# Patient Record
Sex: Female | Born: 1945 | Race: White | Hispanic: No | Smoking: Never smoker
Health system: Southern US, Community
[De-identification: ages and names within clinical notes are randomized; demographics above are authoritative.]

## PROBLEM LIST (undated history)

## (undated) DIAGNOSIS — I119 Hypertensive heart disease without heart failure: Secondary | ICD-10-CM

## (undated) DIAGNOSIS — I10 Essential (primary) hypertension: Secondary | ICD-10-CM

## (undated) DIAGNOSIS — I4819 Other persistent atrial fibrillation: Secondary | ICD-10-CM

## (undated) DIAGNOSIS — E119 Type 2 diabetes mellitus without complications: Secondary | ICD-10-CM

## (undated) DIAGNOSIS — R1032 Left lower quadrant pain: Secondary | ICD-10-CM

## (undated) DIAGNOSIS — Z9989 Dependence on other enabling machines and devices: Secondary | ICD-10-CM

## (undated) DIAGNOSIS — G4710 Hypersomnia, unspecified: Secondary | ICD-10-CM

## (undated) DIAGNOSIS — E559 Vitamin D deficiency, unspecified: Secondary | ICD-10-CM

## (undated) DIAGNOSIS — Z87442 Personal history of urinary calculi: Secondary | ICD-10-CM

## (undated) DIAGNOSIS — Z6841 Body Mass Index (BMI) 40.0 and over, adult: Secondary | ICD-10-CM

## (undated) DIAGNOSIS — G4733 Obstructive sleep apnea (adult) (pediatric): Secondary | ICD-10-CM

## (undated) DIAGNOSIS — K219 Gastro-esophageal reflux disease without esophagitis: Secondary | ICD-10-CM

## (undated) DIAGNOSIS — E7800 Pure hypercholesterolemia, unspecified: Secondary | ICD-10-CM

## (undated) DIAGNOSIS — R011 Cardiac murmur, unspecified: Secondary | ICD-10-CM

## (undated) DIAGNOSIS — J45909 Unspecified asthma, uncomplicated: Secondary | ICD-10-CM

## (undated) DIAGNOSIS — M1990 Unspecified osteoarthritis, unspecified site: Secondary | ICD-10-CM

## (undated) DIAGNOSIS — I420 Dilated cardiomyopathy: Secondary | ICD-10-CM

## (undated) DIAGNOSIS — F4321 Adjustment disorder with depressed mood: Secondary | ICD-10-CM

## (undated) DIAGNOSIS — I5032 Chronic diastolic (congestive) heart failure: Secondary | ICD-10-CM

## (undated) DIAGNOSIS — M7070 Other bursitis of hip, unspecified hip: Secondary | ICD-10-CM

## (undated) HISTORY — PX: APPENDECTOMY: SHX54

## (undated) HISTORY — PX: TONSILLECTOMY: SUR1361

## (undated) HISTORY — PX: OTHER SURGICAL HISTORY: SHX169

## (undated) HISTORY — DX: Left lower quadrant pain: R10.32

## (undated) HISTORY — DX: Morbid (severe) obesity due to excess calories: E66.01

## (undated) HISTORY — DX: Gastro-esophageal reflux disease without esophagitis: K21.9

## (undated) HISTORY — DX: Unspecified asthma, uncomplicated: J45.909

## (undated) HISTORY — DX: Other bursitis of hip, unspecified hip: M70.70

## (undated) HISTORY — DX: Dilated cardiomyopathy: I42.0

## (undated) HISTORY — DX: Essential (primary) hypertension: I10

## (undated) HISTORY — PX: ABDOMINAL HYSTERECTOMY: SHX81

## (undated) HISTORY — DX: Chronic diastolic (congestive) heart failure: I50.32

## (undated) HISTORY — PX: CHOLECYSTECTOMY OPEN: SUR202

## (undated) HISTORY — DX: Vitamin D deficiency, unspecified: E55.9

## (undated) HISTORY — DX: Body Mass Index (BMI) 40.0 and over, adult: Z684

## (undated) HISTORY — PX: DILATION AND CURETTAGE OF UTERUS: SHX78

## (undated) HISTORY — DX: Hypersomnia, unspecified: G47.10

## (undated) HISTORY — DX: Other persistent atrial fibrillation: I48.19

## (undated) HISTORY — PX: CARDIAC CATHETERIZATION: SHX172

## (undated) HISTORY — PX: NASAL SEPTUM SURGERY: SHX37

## (undated) HISTORY — DX: Hypertensive heart disease without heart failure: I11.9

---

## 1998-06-24 ENCOUNTER — Ambulatory Visit (HOSPITAL_COMMUNITY): Admission: RE | Admit: 1998-06-24 | Discharge: 1998-06-24 | Payer: Self-pay | Admitting: Family Medicine

## 1998-08-18 ENCOUNTER — Ambulatory Visit (HOSPITAL_COMMUNITY): Admission: RE | Admit: 1998-08-18 | Discharge: 1998-08-18 | Payer: Self-pay | Admitting: Gastroenterology

## 1999-12-10 ENCOUNTER — Ambulatory Visit (HOSPITAL_COMMUNITY): Admission: RE | Admit: 1999-12-10 | Discharge: 1999-12-10 | Payer: Self-pay | Admitting: Family Medicine

## 1999-12-10 ENCOUNTER — Encounter: Payer: Self-pay | Admitting: Family Medicine

## 2001-02-10 ENCOUNTER — Emergency Department (HOSPITAL_COMMUNITY): Admission: EM | Admit: 2001-02-10 | Discharge: 2001-02-10 | Payer: Self-pay | Admitting: Emergency Medicine

## 2001-05-30 ENCOUNTER — Ambulatory Visit (HOSPITAL_COMMUNITY): Admission: RE | Admit: 2001-05-30 | Discharge: 2001-05-30 | Payer: Self-pay | Admitting: Family Medicine

## 2001-05-30 ENCOUNTER — Encounter: Payer: Self-pay | Admitting: Family Medicine

## 2002-05-15 ENCOUNTER — Ambulatory Visit (HOSPITAL_COMMUNITY): Admission: RE | Admit: 2002-05-15 | Discharge: 2002-05-15 | Payer: Self-pay | Admitting: Family Medicine

## 2002-05-15 ENCOUNTER — Encounter: Payer: Self-pay | Admitting: Family Medicine

## 2003-06-26 ENCOUNTER — Ambulatory Visit (HOSPITAL_COMMUNITY): Admission: RE | Admit: 2003-06-26 | Discharge: 2003-06-26 | Payer: Self-pay | Admitting: Otolaryngology

## 2003-06-26 ENCOUNTER — Encounter (INDEPENDENT_AMBULATORY_CARE_PROVIDER_SITE_OTHER): Payer: Self-pay | Admitting: Specialist

## 2003-07-08 ENCOUNTER — Ambulatory Visit (HOSPITAL_COMMUNITY): Admission: RE | Admit: 2003-07-08 | Discharge: 2003-07-08 | Payer: Self-pay | Admitting: Family Medicine

## 2003-11-29 ENCOUNTER — Ambulatory Visit (HOSPITAL_COMMUNITY): Admission: RE | Admit: 2003-11-29 | Discharge: 2003-11-29 | Payer: Self-pay | Admitting: Gastroenterology

## 2004-04-16 ENCOUNTER — Other Ambulatory Visit: Admission: RE | Admit: 2004-04-16 | Discharge: 2004-04-16 | Payer: Self-pay | Admitting: Family Medicine

## 2004-04-21 ENCOUNTER — Encounter: Admission: RE | Admit: 2004-04-21 | Discharge: 2004-04-21 | Payer: Self-pay | Admitting: Family Medicine

## 2004-09-16 ENCOUNTER — Ambulatory Visit (HOSPITAL_COMMUNITY): Admission: RE | Admit: 2004-09-16 | Discharge: 2004-09-16 | Payer: Self-pay | Admitting: Family Medicine

## 2005-09-02 ENCOUNTER — Ambulatory Visit (HOSPITAL_COMMUNITY): Admission: RE | Admit: 2005-09-02 | Discharge: 2005-09-02 | Payer: Self-pay | Admitting: Family Medicine

## 2006-09-07 ENCOUNTER — Ambulatory Visit (HOSPITAL_COMMUNITY): Admission: RE | Admit: 2006-09-07 | Discharge: 2006-09-07 | Payer: Self-pay | Admitting: Family Medicine

## 2006-09-19 ENCOUNTER — Encounter: Admission: RE | Admit: 2006-09-19 | Discharge: 2006-09-19 | Payer: Self-pay | Admitting: Cardiology

## 2006-09-23 ENCOUNTER — Ambulatory Visit (HOSPITAL_COMMUNITY): Admission: RE | Admit: 2006-09-23 | Discharge: 2006-09-23 | Payer: Self-pay | Admitting: Cardiology

## 2008-06-23 ENCOUNTER — Emergency Department (HOSPITAL_COMMUNITY): Admission: EM | Admit: 2008-06-23 | Discharge: 2008-06-23 | Payer: Self-pay | Admitting: Emergency Medicine

## 2008-09-19 ENCOUNTER — Ambulatory Visit (HOSPITAL_COMMUNITY): Admission: RE | Admit: 2008-09-19 | Discharge: 2008-09-19 | Payer: Self-pay | Admitting: Family Medicine

## 2009-11-13 ENCOUNTER — Ambulatory Visit (HOSPITAL_COMMUNITY): Admission: RE | Admit: 2009-11-13 | Discharge: 2009-11-13 | Payer: Self-pay | Admitting: *Deleted

## 2010-03-01 ENCOUNTER — Encounter: Payer: Self-pay | Admitting: Family Medicine

## 2010-06-23 NOTE — Cardiovascular Report (Signed)
NAME:  Karen Rasmussen, Pollinger                  ACCOUNT NO.:  [id number]   MEDICAL RECORD NO.:  [id number]          PATIENT TYPE:  OIB   LOCATION:  2899                         FACILITY:  [facility]   PHYSICIAN:  Armanda Magic, M.D.     DATE OF BIRTH:  1945-04-24   DATE OF PROCEDURE:  09/23/2006  DATE OF DISCHARGE:  09/23/2006                            CARDIAC CATHETERIZATION   PROCEDURE:  Left heart catheterization, coronary angiography, left  ventriculography.   OPERATOR:  Armanda Magic, M.D. and Donato Schultz, M.D.   COMPLICATIONS:  None.   IV access via right femoral artery 6-French sheath.   This is a very pleasant 65 year old female, morbidly obese, with a  recent history of shortness of breath.  Has not had any chest pain  but  chronic shortness of breath and now presents for cardiac catheterization  because of abnormal Cardiolite.   The patient is brought to the cardiac catheterization laboratory in the  fasting nonsedated state.  Informed consent was obtained.  The patient  was connected to continuous heart rate and pulse oximetry monitoring and  intermittent blood pressure monitoring.  The right groin was prepped and  draped in sterile fashion.  One percent Xylocaine was used for local  anesthesia.  Using the modified Seldinger technique, a 6-French sheath  was placed in the right femoral artery.  Under fluoroscopic guidance, a  6-French JL-4 catheter was placed in the left coronary artery.  Multiple  cine films were taken at 30 degree RAO and 40 degree LAO views.  This  catheter was then exchanged out over a guidewire for a 6-French JR-4  catheter which was placed under fluoroscopic guidance to the right  coronary artery.  Multiple cine films were taken at 30 degree RAO and 40  degree LAO views.  This catheter was the exchanged out over a guidewire  for a 6-French angled pigtail catheter which was placed under  fluoroscopic guidance in the left ventricular cavity.  Left  ventriculography  was performed in the 30 degree RAO view using a total  of 30 cc of contrast at 15 cc per second.  The catheter was then pulled  back across the aortic valve with no significant gradient noted.  At the  end of the procedure, all catheters and sheaths were removed.  Manual  compression was performed until adequate hemostasis was obtained.  The  patient was transferred back to her room in stable condition.   RESULTS:  The left main coronary is widely patent and bifurcates to the  left anterior descending artery and left circumflex artery.  The left  anterior descending artery is widely patent throughout its course at the  apex, giving rise to one diagonal branch which is widely patent.   The left circumflex is widely patent throughout its course.  It gives  rise to three obtuse marginal branches, all of which are widely patent.  The distal left circumflex is widely patent, as well.   The RCA is widely patent throughout its course and bifurcates into a  posterior descending artery and posterior lateral artery, both of which  are widely patent.   Left ventriculography shows normal LV systolic function, EF 60%.  Left  ventricular pressure was 134/17, aortic pressure 132/61, LVEDP 26 mmHg.   ASSESSMENT:  1. Normal coronary arteries.  2. Normal left ventricular function.  3. Noncardiac chest pain.  4. Obesity.  5. Elevated left ventricular end diastolic pressure consistent with      diastolic dysfunction.   PLAN:  Discharge to home after IV fluid and bed rest,  Will add HCTZ 25  mg daily for increased LVEDP and diastolic dysfunction, and she will  follow up with me in 2 weeks.      Armanda Magic, M.D.  Electronically Signed     TT/MEDQ  D:  09/23/2006  T:  09/23/2006  Job:  [id number]   cc:   Sigmund Hazel, M.D.

## 2010-06-26 NOTE — H&P (Signed)
NAME:  Karen Rasmussen, Karen Rasmussen                            ACCOUNT NO.:  [id number]   MEDICAL RECORD NO.:  [id number]                   PATIENT TYPE:  OIB   LOCATION:  2899                                 FACILITY:  [facility]   PHYSICIAN:  Hermelinda Medicus, M.D.                DATE OF BIRTH:  02-24-1945   DATE OF ADMISSION:  06/26/2003  DATE OF DISCHARGE:                                HISTORY & PHYSICAL   This patient is a 65 year old female who has had difficulty with sinuses in  the past.  She has had considerable problems with sinusitis.  She has been  on antibiotics in the past and also been on nasal sprays with Tussionex for  cough.  She has been more recently on Prolex-D and Cefzil, under my care.  She has had a nasal obstruction and has had sinusitis problems and has been  a candidate for septal reconstruction, turbinate reduction for some time.  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  She also wishes to have nasal tip plasty and also here  she will have an upper lid blepharoplasty.   PAST HISTORY:   MEDICATIONS:  1. Zoloft 100 mg h.s.  2. __________ 100 mg b.i.d.  3. Guaifenesin 600.  4. Decongestant for nasal stuffiness which is Prolex-D one half tablet     b.i.d.  5. Bactroban cream.  6. Aspirin 81.  7. She has had amoxicillin before the procedure.   She has had:  1. T&A as a child .  2. Tubal ligation.  3. Hysterectomy, 1992.  4. Gallbladder, 1987.   She does have:  1. A murmur of her mitral valve prolapse.  2. She has had an EKG and Doppler report which show normal left ventricular     dimension, preserved systolic function, mild aortic valve sclerosis, with     a peak velocity of 2.1 M/sec, mild right ventricular hypertrophy.  In     view of the mild aortic sclerosis, they are  recommending bacterial     endocarditis prophylaxis.   She, otherwise, is in excellent health and this was done by Dr. Hyacinth Meeker at  [facility].   PHYSICAL EXAMINATION:  GENERAL:  Reveals a well nourished well developed  female in no acute distress.  VITAL SIGNS:  Blood pressure 118/70, her heart rate is 77, respirations 18.  She weighs 247 and is 6 foot 2 inches which is tall.  EARS:  Clear.  The tympanic membranes are clear.  NOSE:  Shows a severe septal deviation to the left with concha bullosa on  the right.  CARDIOVASCULAR:  She has, on examination, a mild murmur of aortic sclerosis.  __________ otherwise abnormalities except for the murmur of aortic  sclerosis.  CHEST:  Clear.  No rales, rhonchi, or wheezes.  ABDOMEN:  Unremarkable.  EXTREMITIES:  Unremarkable.   INITIAL DIAGNOSES:  1. Septal deviation.  2. 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  3. History of a murmur of aortic sclerosis.  4. Mild right ventricular hypertrophy.  5. Mild aortic valve sclerosis.   The T&A as a child, tubal ligation, hysterectomy, gallbladder, and  appendectomy history.                                                Hermelinda Medicus, M.D.    JC/MEDQ  D:  06/26/2003  T:  06/26/2003  Job:  [id number]   cc:   Hyacinth Meeker, Dr.  [facility]   Meade Maw, M.D.  [address]  [place]  [place] [address]  Fax: [phone]

## 2010-06-26 NOTE — Op Note (Signed)
NAME:  Karen Rasmussen, Karen Rasmussen                  ACCOUNT NO.:  [id number]   MEDICAL RECORD NO.:  [id number]          PATIENT TYPE:  AMB   LOCATION:  ENDO                         [facility]AC[facility]   PHYSICIAN:  John C. Madilyn Fireman, M.D.    DATE OF BIRTH:  Feb 17, 1945   DATE OF PROCEDURE:  11/29/2003  DATE OF DISCHARGE:                                 OPERATIVE REPORT   PROCEDURE:  Colonoscopy.   INDICATIONS FOR PROCEDURE:  Family history of colon cancer due for  surveillance with last colonoscopy five years ago.   DESCRIPTION OF PROCEDURE:  The patient was placed in the left lateral  decubitus position then placed on the pulse monitor with continuous low flow  oxygen delivered by nasal cannula. She was sedated with 87.5 mcg IV fentanyl  and 9  mg IV Versed. The Olympus video colonoscope was inserted into the  rectum and advanced to the cecum, confirmed by transillumination at  McBurney's point and visualization at the ileocecal valve and appendiceal  orifice. The prep was good.  The cecum, ascending, transverse, descending  and sigmoid colon all appeared normal with no masses, polyps, diverticula or  other mucosal abnormalities. The rectum likewise appeared normal and  retroflexed view of the anus revealed no obvious internal hemorrhoids. The  scope was then withdrawn and the patient returned to the recovery room in  stable condition. The patient tolerated the procedure well and there were no  immediate complications.   IMPRESSION:  Normal colonoscopy.   PLAN:  Repeat study in five years.      JCH/MEDQ  D:  11/29/2003  T:  11/29/2003  Job:  [id number]   cc:   Sigmund Hazel, M.D.  [address]  [address] [place], [place] [address]  Fax: [phone]

## 2010-06-26 NOTE — Op Note (Signed)
NAME:  Karen Rasmussen, Karen Rasmussen                            ACCOUNT NO.:  [id number]   MEDICAL RECORD NO.:  [id number]                   PATIENT TYPE:  OIB   LOCATION:  2899                                 FACILITY:  [facility]   PHYSICIAN:  Hermelinda Medicus, M.D.                DATE OF BIRTH:  1945-03-15   DATE OF PROCEDURE:  06/26/2003  DATE OF DISCHARGE:                                 OPERATIVE REPORT   PREOPERATIVE DIAGNOSIS:  Septal deviation with nasal tip deformity with  upper lid blepharoptosis, blepharochalasis.   POSTOPERATIVE DIAGNOSIS:  Septal deviation with nasal tip deformity with  upper lid blepharoptosis, blepharochalasis.   OPERATION PERFORMED:  Septal reconstruction, nasal tip plasty with upper lid  blepharoplasty.   SURGEON:  Hermelinda Medicus, M.D.   ANESTHESIA:  Local 1% Xylocaine with epinephrine, topical cocaine 200 mg and  general anesthesia MAC.   ANESTHESIOLOGIST:  Burna Forts, M.D.   DESCRIPTION OF PROCEDURE:  Patient placed in supine position and under local  anesthesia was prepped and draped in the usual manner using the usual head  drape and the usual sinus draping.  The septum was first approached and the  1% Xylocaine with epinephrine and topical cocaine was used and then a  hemitransfixion incision was made on the septum on the right side, carried  around the columella and the columella was trimmed conservatively.  The  septal quadrilateral cartilage was then approached and was grossly deviated  over to the left and then was spurred in the vomerine region over pushing  against the side of the nose.  This approached using the pierce and the  Cottle was used, then the Purdy and a strip of quadrilateral cartilage was  taken readily from the posterior aspect in the floor of the nose.  A 4 mm  chisel was then used to take down the premaxillary spur crest.  A vomerine  septal deviation was then approached but first we approached the middle  turbinate concha bullosa,  especially on the right side, and crushed this,  making it much smaller but not removing any mucous membrane.  This was done  on both sides.  On the right, it was much more severe, however.  Then we  were able to take out this spur that was in the vomerine region and bring  the septum back to the midline.  Once the septum and quadrilateral cartilage  was back to the midline and off and was back on the premaxillary crest, we  then could begin to close this.  Before we completed this, we used the open  and closed Jansen-Middletons to take down some of the ethmoid septal  deviation also.  Closure with 5-0 plain catgut and a through-and-through  septal suture using 4-0 plain catgut times two.  Once the septum was  completed, an intercartilaginous incision was made and a rim incision was  made.  The  lower lateral cartilages were exposed.  They were trimmed  appropriately in an effort to gain symmetry (1) and (2) resolve the bulbous  appearance of this patient's nose. This lower lateral cartilage was then  returned to its original position and the rim incision was closed with a 5-0  plain catgut and the intercartilaginous incision was closed partially with a  5-0 plain catgut.  All bleeding was totally under control.  We did not place  any packing or nasal casting until we completed our blepharoplasty.  The  blepharoplasty incisions were already marked in the recovery room and we  used 1% Xylocaine with epinephrine for our anesthesia and then excised  appropriate excess upper lid skin along with medial fat and then hemostasis  was established with Bovie electrocoagulation. The right eyelid was more  involved and more severe than the left, so therefore slightly more skin was  removed.  Once this was completed, all hemostasis was established and then  closure was with a subcuticular stitch using 6-0 Ethilon and then lateral in  the small lines or crow's feet area, interrupted stitches were used  still  using the 6-0.  Steri-Strips were applied.  The opposite side was done in  the exact same way and closure was completed in the exact same way.  The  cold compresses were placed on the eyes.  The nose was then suctioned, the  nasal dressing was then placed and an external nasal cast was placed also.  The patient tolerated the procedure well and is doing well postoperatively.  Follow-up will be tomorrow for removal of the nasal packing and then on  Monday for removal of the cast and later in the week for removal of upper  lid stitches.  The patient tolerated the procedure well and is doing well  postoperatively and her follow-up will continue in two weeks, three weeks,  five weeks, three months, six months and a year.                                               Hermelinda Medicus, M.D.    JC/MEDQ  D:  06/26/2003  T:  06/27/2003  Job:  [id number]   cc:   Dr. Altamese Dilling, M.D.  [address]  [place]  [place] [address]  Fax: [phone]

## 2010-11-06 ENCOUNTER — Emergency Department (HOSPITAL_COMMUNITY): Payer: No Typology Code available for payment source

## 2010-11-06 ENCOUNTER — Emergency Department (HOSPITAL_COMMUNITY)
Admission: EM | Admit: 2010-11-06 | Discharge: 2010-11-06 | Disposition: A | Discharge status: Home / Self Care | Payer: No Typology Code available for payment source | Attending: Emergency Medicine | Admitting: Emergency Medicine

## 2010-11-06 DIAGNOSIS — N201 Calculus of ureter: Secondary | ICD-10-CM | POA: Insufficient documentation

## 2010-11-06 DIAGNOSIS — R109 Unspecified abdominal pain: Secondary | ICD-10-CM | POA: Insufficient documentation

## 2010-11-06 DIAGNOSIS — G4730 Sleep apnea, unspecified: Secondary | ICD-10-CM | POA: Insufficient documentation

## 2010-11-06 DIAGNOSIS — R319 Hematuria, unspecified: Secondary | ICD-10-CM | POA: Insufficient documentation

## 2010-11-06 DIAGNOSIS — R1032 Left lower quadrant pain: Secondary | ICD-10-CM | POA: Insufficient documentation

## 2010-11-06 LAB — URINE MICROSCOPIC-ADD ON

## 2010-11-06 LAB — URINALYSIS, ROUTINE W REFLEX MICROSCOPIC
Glucose, UA: NEGATIVE mg/dL
Leukocytes, UA: NEGATIVE
Protein, ur: NEGATIVE mg/dL
Specific Gravity, Urine: 1.021 (ref 1.005–1.030)

## 2010-11-07 ENCOUNTER — Emergency Department (HOSPITAL_COMMUNITY)
Admission: EM | Admit: 2010-11-07 | Discharge: 2010-11-08 | Disposition: A | Discharge status: Home / Self Care | Payer: No Typology Code available for payment source | Attending: Emergency Medicine | Admitting: Emergency Medicine

## 2010-11-07 DIAGNOSIS — Z87442 Personal history of urinary calculi: Secondary | ICD-10-CM | POA: Insufficient documentation

## 2010-11-07 DIAGNOSIS — I10 Essential (primary) hypertension: Secondary | ICD-10-CM | POA: Insufficient documentation

## 2010-11-07 DIAGNOSIS — R509 Fever, unspecified: Secondary | ICD-10-CM | POA: Insufficient documentation

## 2010-11-07 DIAGNOSIS — R10819 Abdominal tenderness, unspecified site: Secondary | ICD-10-CM | POA: Insufficient documentation

## 2010-11-07 DIAGNOSIS — R000 Tachycardia, unspecified: Secondary | ICD-10-CM | POA: Insufficient documentation

## 2010-11-07 DIAGNOSIS — R609 Edema, unspecified: Secondary | ICD-10-CM | POA: Insufficient documentation

## 2010-11-08 ENCOUNTER — Emergency Department (HOSPITAL_COMMUNITY): Payer: No Typology Code available for payment source

## 2010-11-08 LAB — CBC
HCT: 37.1 % (ref 36.0–46.0)
Hemoglobin: 11.8 g/dL — ABNORMAL LOW (ref 12.0–15.0)
RBC: 4.29 MIL/uL (ref 3.87–5.11)
RDW: 14.9 % (ref 11.5–15.5)
WBC: 16.4 10*3/uL — ABNORMAL HIGH (ref 4.0–10.5)

## 2010-11-08 LAB — URINALYSIS, ROUTINE W REFLEX MICROSCOPIC
Bilirubin Urine: NEGATIVE
Nitrite: NEGATIVE
Protein, ur: NEGATIVE mg/dL
Urobilinogen, UA: 0.2 mg/dL (ref 0.0–1.0)

## 2010-11-08 LAB — PROCALCITONIN: Procalcitonin: 2.36 ng/mL

## 2010-11-08 LAB — DIFFERENTIAL
Basophils Relative: 0 % (ref 0–1)
Eosinophils Relative: 0 % (ref 0–5)
Lymphocytes Relative: 4 % — ABNORMAL LOW (ref 12–46)
Monocytes Relative: 2 % — ABNORMAL LOW (ref 3–12)
Neutrophils Relative %: 94 % — ABNORMAL HIGH (ref 43–77)
WBC Morphology: INCREASED

## 2010-11-08 LAB — POCT I-STAT, CHEM 8
Creatinine, Ser: 1.8 mg/dL — ABNORMAL HIGH (ref 0.50–1.10)
Glucose, Bld: 171 mg/dL — ABNORMAL HIGH (ref 70–99)
HCT: 38 % (ref 36.0–46.0)
Hemoglobin: 12.9 g/dL (ref 12.0–15.0)
Potassium: 3 mEq/L — ABNORMAL LOW (ref 3.5–5.1)
Sodium: 135 mEq/L (ref 135–145)
TCO2: 23 mmol/L (ref 0–100)

## 2010-11-08 LAB — URINE MICROSCOPIC-ADD ON

## 2010-11-09 LAB — URINE CULTURE
Colony Count: 35000
Culture  Setup Time: 201209301127

## 2010-11-14 LAB — CULTURE, BLOOD (ROUTINE X 2): Culture: NO GROWTH

## 2010-11-15 LAB — CULTURE, BLOOD (ROUTINE X 2)

## 2011-03-12 ENCOUNTER — Ambulatory Visit
Admission: RE | Admit: 2011-03-12 | Discharge: 2011-03-12 | Disposition: A | Discharge status: Home / Self Care | Payer: No Typology Code available for payment source | Source: Ambulatory Visit | Attending: Family Medicine | Admitting: Family Medicine

## 2011-03-12 ENCOUNTER — Other Ambulatory Visit: Payer: Self-pay | Admitting: Family Medicine

## 2011-03-12 DIAGNOSIS — R197 Diarrhea, unspecified: Secondary | ICD-10-CM

## 2011-03-12 MED ORDER — IOHEXOL 300 MG/ML  SOLN
125.0000 mL | Freq: Once | INTRAMUSCULAR | Status: AC | PRN
  Administered 2011-03-12: 125 mL via INTRAVENOUS

## 2012-11-30 ENCOUNTER — Other Ambulatory Visit (HOSPITAL_COMMUNITY): Payer: Self-pay | Admitting: Family Medicine

## 2012-11-30 DIAGNOSIS — Z1231 Encounter for screening mammogram for malignant neoplasm of breast: Secondary | ICD-10-CM

## 2012-12-11 ENCOUNTER — Ambulatory Visit (HOSPITAL_COMMUNITY): Payer: No Typology Code available for payment source

## 2012-12-26 ENCOUNTER — Ambulatory Visit (HOSPITAL_COMMUNITY)
Admission: RE | Admit: 2012-12-26 | Discharge: 2012-12-26 | Disposition: A | Discharge status: Home / Self Care | Payer: No Typology Code available for payment source | Source: Ambulatory Visit | Attending: Family Medicine | Admitting: Family Medicine

## 2012-12-26 DIAGNOSIS — Z1231 Encounter for screening mammogram for malignant neoplasm of breast: Secondary | ICD-10-CM | POA: Insufficient documentation

## 2012-12-28 ENCOUNTER — Other Ambulatory Visit: Payer: Self-pay | Admitting: Family Medicine

## 2012-12-28 DIAGNOSIS — R928 Other abnormal and inconclusive findings on diagnostic imaging of breast: Secondary | ICD-10-CM

## 2012-12-30 ENCOUNTER — Encounter: Payer: Self-pay | Admitting: *Deleted

## 2012-12-30 ENCOUNTER — Encounter: Payer: Self-pay | Admitting: Cardiology

## 2012-12-30 DIAGNOSIS — K219 Gastro-esophageal reflux disease without esophagitis: Secondary | ICD-10-CM | POA: Insufficient documentation

## 2012-12-30 DIAGNOSIS — I119 Hypertensive heart disease without heart failure: Secondary | ICD-10-CM | POA: Insufficient documentation

## 2012-12-30 DIAGNOSIS — R1032 Left lower quadrant pain: Secondary | ICD-10-CM | POA: Insufficient documentation

## 2012-12-30 DIAGNOSIS — G4733 Obstructive sleep apnea (adult) (pediatric): Secondary | ICD-10-CM | POA: Insufficient documentation

## 2012-12-30 DIAGNOSIS — M7070 Other bursitis of hip, unspecified hip: Secondary | ICD-10-CM | POA: Insufficient documentation

## 2012-12-30 DIAGNOSIS — F329 Major depressive disorder, single episode, unspecified: Secondary | ICD-10-CM | POA: Insufficient documentation

## 2012-12-30 DIAGNOSIS — J45909 Unspecified asthma, uncomplicated: Secondary | ICD-10-CM | POA: Insufficient documentation

## 2012-12-30 DIAGNOSIS — G4710 Hypersomnia, unspecified: Secondary | ICD-10-CM | POA: Insufficient documentation

## 2013-01-02 ENCOUNTER — Encounter (INDEPENDENT_AMBULATORY_CARE_PROVIDER_SITE_OTHER): Payer: Self-pay

## 2013-01-02 ENCOUNTER — Ambulatory Visit (INDEPENDENT_AMBULATORY_CARE_PROVIDER_SITE_OTHER): Payer: No Typology Code available for payment source | Admitting: Cardiology

## 2013-01-02 ENCOUNTER — Encounter: Payer: Self-pay | Admitting: Cardiology

## 2013-01-02 VITALS — BP 124/60 | HR 70 | Ht 62.0 in | Wt 304.0 lb

## 2013-01-02 DIAGNOSIS — I509 Heart failure, unspecified: Secondary | ICD-10-CM

## 2013-01-02 DIAGNOSIS — I5032 Chronic diastolic (congestive) heart failure: Secondary | ICD-10-CM

## 2013-01-02 DIAGNOSIS — G4733 Obstructive sleep apnea (adult) (pediatric): Secondary | ICD-10-CM

## 2013-01-02 LAB — BASIC METABOLIC PANEL
BUN: 15 mg/dL (ref 6–23)
Chloride: 103 mEq/L (ref 96–112)
Creatinine, Ser: 0.9 mg/dL (ref 0.4–1.2)

## 2013-01-02 MED ORDER — HYDROCHLOROTHIAZIDE 25 MG PO TABS: 25.0000 mg | ORAL_TABLET | Freq: Every day | ORAL | Status: DC

## 2013-01-02 MED ORDER — DILTIAZEM HCL ER BEADS 180 MG PO CP24: 180.0000 mg | ORAL_CAPSULE | Freq: Every day | ORAL | Status: DC

## 2013-01-02 NOTE — Progress Notes (Signed)
[address] [place], [place]  [address] Phone: [phone] Fax:  [phone]  Date:  01/02/2013   ID:  Karen Rasmussen, DOB 03-Nov-1945, MRN [id number]  PCP:  Neldon Labella, MD  Sleep Medicine:  Armanda Magic, MD   History of Present Illness: Karen Rasmussen is a 67 y.o. female with a history of morbid obesity, OSA and chronic diastolic CHF who presents today for followup.  She is very frustrated with her continued weight gain.  She complains of lack o energy since gaining weight.  She denies any chest pain but has chronic DOE with lack of endurance.  She does not get any physical activity.  She tolerates the CPAP well.  She uses the nasal pillow mask with chin strap which she tolerates well and feels the pressure is adequate.  She feels rested in the am and has no daytime sleepiness.   Wt Readings from Last 3 Encounters:  01/02/13 304 lb (137.893 kg)     Past Medical History  Diagnosis Date  . Abdominal pain, left lower quadrant   . OSA (obstructive sleep apnea)   . Benign hypertensive heart disease without heart failure   . Obesity   . GERD (gastroesophageal reflux disease)   . Hypersomnia   . Depression   . Asthmatic bronchitis   . Bursitis of hip   . Morbid obesity with BMI of 50.0-59.9, adult   . Chronic diastolic CHF (congestive heart failure)   . Vitamin D deficiency disease   . Kidney stones     Current Outpatient Prescriptions  Medication Sig Dispense Refill  . Cholecalciferol (VITAMIN D PO) Take 5,000 mg by mouth daily.      Marland Kitchen diltiazem (TIAZAC) 180 MG 24 hr capsule Take 180 mg by mouth daily.      . Glucosamine 500 MG CAPS Take 1 capsule by mouth daily.      . hydrochlorothiazide (HYDRODIURIL) 25 MG tablet Take 25 mg by mouth daily.      Marland Kitchen omeprazole (PRILOSEC) 20 MG capsule Take 20 mg by mouth daily.      . sertraline (ZOLOFT) 100 MG tablet Take 100 mg by mouth daily.       No current facility-administered medications for this visit.     Allergies:   No Known Allergies  Social History:  The patient  reports that she has never smoked. She does not have any smokeless tobacco history on file. She reports that she does not use illicit drugs.   Family History:  The patient's family history includes CAD in her mother; Cancer in her father; Diabetes in her brother and father; Heart failure in her father; Hypertension in her brother.   ROS:  Please see the history of present illness.      All other systems reviewed and negative.   PHYSICAL EXAM: VS:  BP 124/60  Pulse 70  Ht 5\' 2"  (1.575 m)  Wt 304 lb (137.893 kg)  BMI 55.59 kg/m2 Well nourished, well developed, in no acute distress HEENT: normal Neck: no JVD Cardiac:  normal S1, S2; RRR; no murmur Lungs:  clear to auscultation bilaterally, no wheezing, rhonchi or rales Abd: soft, nontender, no hepatomegaly Ext: trace edema Skin: warm and dry Neuro:  CNs 2-12 intact, no focal abnormalities noted  EKG:  NSR with LAFB and no ST changes     ASSESSMENT AND PLAN:  1. OSA on CPAP tolerating well.  Her download today showed an AHI of 4/hr on 12cm H2o  and 98% compliance in using more than 4 hours nightly.  She will continue on current settings. 2. Morbid obesity  - we had a discussion about weight surgery and she is interested in pursuing a consult.  She is frustrated with her continued weight gain which is causing her to be more sedentary.  I will refer her to [facility]. 3. Chronic diastolic CHF appears compensated  - continue Diltiazem and HCTZ  - check BMET  Followup with me in 6 months  Signed, Armanda Magic, MD 01/02/2013 3:37 PM

## 2013-01-02 NOTE — Patient Instructions (Addendum)
I sent a refill in for you Diltiazem and HCTZ 90 day supply with 3 refills  Your physician recommends that you go to the lab today for a BMET  You have been referred to [facility]   Your physician wants you to follow-up in: 6 Month with Dr Sherlyn Lick will receive a reminder letter in the mail two months in advance. If you don't receive a letter, please call our office to schedule the follow-up appointment.

## 2013-01-05 ENCOUNTER — Encounter: Payer: Self-pay | Admitting: General Surgery

## 2013-01-12 ENCOUNTER — Ambulatory Visit
Admission: RE | Admit: 2013-01-12 | Discharge: 2013-01-12 | Disposition: A | Discharge status: Home / Self Care | Payer: No Typology Code available for payment source | Source: Ambulatory Visit | Attending: Family Medicine | Admitting: Family Medicine

## 2013-01-12 DIAGNOSIS — R928 Other abnormal and inconclusive findings on diagnostic imaging of breast: Secondary | ICD-10-CM

## 2013-02-27 ENCOUNTER — Telehealth: Payer: Self-pay | Admitting: Cardiology

## 2013-02-27 NOTE — Telephone Encounter (Signed)
I do not recommend weight loss meds.  She should consider joining weight watchers

## 2013-02-27 NOTE — Telephone Encounter (Signed)
LVM for pt to return call

## 2013-02-27 NOTE — Telephone Encounter (Signed)
To Dr. Radford Pax. I wanted to verify we do not prescribe weight loss rxs for pt.

## 2013-02-27 NOTE — Telephone Encounter (Signed)
New message  Patient would like to know if you will prescribe Phentermine to help her lose weight, Please call and advise.

## 2013-03-01 NOTE — Telephone Encounter (Signed)
Pt made aware

## 2013-04-18 ENCOUNTER — Other Ambulatory Visit: Payer: Self-pay | Admitting: Dermatology

## 2013-05-09 ENCOUNTER — Encounter: Payer: No Typology Code available for payment source | Attending: Family Medicine

## 2013-05-09 VITALS — Ht 62.0 in | Wt 292.4 lb

## 2013-05-09 DIAGNOSIS — E119 Type 2 diabetes mellitus without complications: Secondary | ICD-10-CM | POA: Diagnosis not present

## 2013-05-09 DIAGNOSIS — E1165 Type 2 diabetes mellitus with hyperglycemia: Secondary | ICD-10-CM

## 2013-05-09 DIAGNOSIS — Z713 Dietary counseling and surveillance: Secondary | ICD-10-CM | POA: Insufficient documentation

## 2013-05-09 DIAGNOSIS — IMO0001 Reserved for inherently not codable concepts without codable children: Secondary | ICD-10-CM

## 2013-05-09 NOTE — Progress Notes (Signed)
Patient was seen on 05/09/13 for the first of a series of three diabetes self-management courses at the [facility].  Current HbA1c: 6.9% per patient  The following learning objectives were met by the patient during this class:  Describe diabetes  State some common risk factors for diabetes  Defines the role of glucose and insulin  Identifies type of diabetes and pathophysiology  Describe the relationship between diabetes and cardiovascular risk  State the members of the Healthcare Team  States the rationale for glucose monitoring  State when to test glucose  State their individual Target Range  State the importance of logging glucose readings  Describe how to interpret glucose readings  Identifies A1C target  Explain the correlation between A1c and eAG values  State symptoms and treatment of high blood glucose  State symptoms and treatment of low blood glucose  Explain proper technique for glucose testing  Identifies proper sharps disposal  Handouts given during class include:  Living Well with Diabetes book  Carb Counting and Meal Planning book  Meal Plan Card  Carbohydrate guide  Meal planning worksheet  Low Sodium Flavoring Tips  The diabetes portion plate  Q8G to eAG Conversion Chart  Diabetes Medications  Diabetes Recommended Care Schedule  Support Group  Diabetes Success Plan  Core Class Satisfaction Survey  Follow-Up Plan:  Attend core 2

## 2013-05-16 DIAGNOSIS — E119 Type 2 diabetes mellitus without complications: Secondary | ICD-10-CM | POA: Diagnosis not present

## 2013-05-17 NOTE — Progress Notes (Signed)

## 2013-05-23 DIAGNOSIS — E119 Type 2 diabetes mellitus without complications: Secondary | ICD-10-CM

## 2013-05-23 NOTE — Progress Notes (Signed)
Patient was seen on 05/23/2013 for the third of a series of three diabetes self-management courses at the [facility]. The following learning objectives were met by the patient during this class:    State the amount of activity recommended for healthy living   Describe activities suitable for individual needs   Identify ways to regularly incorporate activity into daily life   Identify barriers to activity and ways to over come these barriers  Identify diabetes medications being personally used and their primary action for lowering glucose and possible side effects   Describe role of stress on blood glucose and develop strategies to address psychosocial issues   Identify diabetes complications and ways to prevent them  Explain how to manage diabetes during illness   Evaluate success in meeting personal goal   Establish 2-3 goals that they will plan to diligently work on until they return for the  63-monthfollow-up visit  Goals:  Follow Diabetes Meal Plan as instructed  Aim for 15-30 mins of physical activity daily as tolerated  Bring food record and glucose log to your follow up visit  Your patient has established the following 4 month goals in their individualized success plan:  Be active 20 minutes or more 5 x a week  Take my diabetes medications as scheduled  Your patient has identified their diabetes self-care support plan as  [facility]  [facility]  Plan:  Attend Core 4 in 4 months

## 2013-09-10 ENCOUNTER — Ambulatory Visit: Payer: No Typology Code available for payment source

## 2013-11-13 ENCOUNTER — Other Ambulatory Visit: Payer: Self-pay | Admitting: Cardiology

## 2014-01-12 ENCOUNTER — Other Ambulatory Visit: Payer: Self-pay | Admitting: Cardiology

## 2014-01-12 NOTE — Telephone Encounter (Signed)
Rx was sent to pharmacy electronically. 

## 2014-02-10 ENCOUNTER — Other Ambulatory Visit: Payer: Self-pay | Admitting: Cardiology

## 2014-04-12 ENCOUNTER — Other Ambulatory Visit: Payer: Self-pay | Admitting: Cardiology

## 2014-10-15 ENCOUNTER — Encounter: Payer: Self-pay | Admitting: Cardiology

## 2014-10-22 ENCOUNTER — Other Ambulatory Visit: Payer: Self-pay | Admitting: Gastroenterology

## 2014-10-22 DIAGNOSIS — D126 Benign neoplasm of colon, unspecified: Secondary | ICD-10-CM | POA: Diagnosis not present

## 2014-10-22 DIAGNOSIS — D120 Benign neoplasm of cecum: Secondary | ICD-10-CM | POA: Diagnosis not present

## 2014-10-22 DIAGNOSIS — R197 Diarrhea, unspecified: Secondary | ICD-10-CM | POA: Diagnosis not present

## 2014-10-22 DIAGNOSIS — D122 Benign neoplasm of ascending colon: Secondary | ICD-10-CM | POA: Diagnosis not present

## 2014-10-22 DIAGNOSIS — D214 Benign neoplasm of connective and other soft tissue of abdomen: Secondary | ICD-10-CM | POA: Diagnosis not present

## 2014-10-22 DIAGNOSIS — K5730 Diverticulosis of large intestine without perforation or abscess without bleeding: Secondary | ICD-10-CM | POA: Diagnosis not present

## 2014-10-23 ENCOUNTER — Other Ambulatory Visit: Payer: Self-pay | Admitting: Family Medicine

## 2014-10-23 ENCOUNTER — Other Ambulatory Visit: Payer: Self-pay

## 2014-10-23 ENCOUNTER — Telehealth: Payer: Self-pay | Admitting: Cardiology

## 2014-10-23 DIAGNOSIS — R921 Mammographic calcification found on diagnostic imaging of breast: Secondary | ICD-10-CM

## 2014-10-23 NOTE — Telephone Encounter (Signed)
New message      Pt want a new CPAP machine order faxed to [facility].  Her machine is 67yrs old and she want a newer model.

## 2014-10-24 NOTE — Telephone Encounter (Signed)
Patient is aware that we are working on getting all paperwork together to send to [facility] for new machine.

## 2014-10-24 NOTE — Telephone Encounter (Signed)
I have sent [facility] a message to check to make sure patient is eligible.

## 2014-10-29 ENCOUNTER — Ambulatory Visit
Admission: RE | Admit: 2014-10-29 | Discharge: 2014-10-29 | Disposition: A | Discharge status: Home / Self Care | Payer: Medicare Other | Source: Ambulatory Visit | Attending: Family Medicine | Admitting: Family Medicine

## 2014-10-29 DIAGNOSIS — R921 Mammographic calcification found on diagnostic imaging of breast: Secondary | ICD-10-CM | POA: Diagnosis not present

## 2014-11-14 ENCOUNTER — Ambulatory Visit: Payer: No Typology Code available for payment source | Admitting: Cardiology

## 2014-11-18 DIAGNOSIS — K219 Gastro-esophageal reflux disease without esophagitis: Secondary | ICD-10-CM | POA: Diagnosis not present

## 2014-11-18 DIAGNOSIS — Z86010 Personal history of colonic polyps: Secondary | ICD-10-CM | POA: Diagnosis not present

## 2014-11-18 DIAGNOSIS — R197 Diarrhea, unspecified: Secondary | ICD-10-CM | POA: Diagnosis not present

## 2014-11-27 NOTE — Progress Notes (Signed)
Cardiology Office Note   Date:  11/28/2014   ID:  Karen Rasmussen, DOB May 08, 1945, MRN [id number]  PCP:  Tawanna Solo, MD    Chief Complaint  Patient presents with  . Sleep Apnea  . Hypertension  . Congestive Heart Failure      History of Present Illness: Karen Rasmussen is a 69 y.o. female with a history of morbid obesity, OSA and chronic diastolic CHF who presents today for followup.  She denies any chest pain, SOB, DOE, LE edema, dizziness or palpitations. She does not get any physical activity. She tolerates the CPAP well. She uses the nasal pillow mask with chin strap which she tolerates well and feels the pressure is adequate. She feels rested in the am and has no daytime sleepiness.  She denies any mouth dryness or nasal congestion.      Past Medical History  Diagnosis Date  . Abdominal pain, left lower quadrant   . OSA (obstructive sleep apnea)   . Benign hypertensive heart disease without heart failure   . Obesity   . GERD (gastroesophageal reflux disease)   . Hypersomnia   . Depression   . Asthmatic bronchitis   . Bursitis of hip   . Morbid obesity with BMI of 50.0-59.9, adult ([place])   . Chronic diastolic CHF (congestive heart failure) ([place])   . Vitamin D deficiency disease   . Kidney stones   . Diabetes mellitus without complication ([place])     No past surgical history on file.   Current Outpatient Prescriptions  Medication Sig Dispense Refill  . Cholecalciferol (VITAMIN D PO) Take 1 tablet by mouth daily.     Marland Kitchen diltiazem (TIAZAC) 180 MG 24 hr capsule TAKE 1 CAPSULE DAILY (NEED APPOINTMENT) 30 capsule 0  . Glucosamine 500 MG CAPS Take 1 capsule by mouth daily.    . hydrochlorothiazide (HYDRODIURIL) 25 MG tablet TAKE 1 TABLET DAILY (MAKE AN APPOINTMENT FOR FUTURE REFILLS) 30 tablet 0  . omeprazole (PRILOSEC) 20 MG capsule Take 20 mg by mouth daily.    . sertraline (ZOLOFT) 100 MG tablet Take 100 mg by mouth daily.     No current  facility-administered medications for this visit.    Allergies:   Review of patient's allergies indicates no known allergies.    Social History:  The patient  reports that she has never smoked. She does not have any smokeless tobacco history on file. She reports that she does not use illicit drugs.   Family History:  The patient's family history includes CAD in her mother; Cancer in her father; Diabetes in her brother and father; Heart failure in her father; Hypertension in her brother.    ROS:  Please see the history of present illness.   Otherwise, review of systems are positive for none.   All other systems are reviewed and negative.    PHYSICAL EXAM: VS:  BP 128/90 mmHg  Pulse 75  Ht 5\' 2"  (1.575 m)  Wt 292 lb (132.45 kg)  BMI 53.39 kg/m2 , BMI Body mass index is 53.39 kg/(m^2). GEN: Well nourished, well developed, in no acute distress HEENT: normal Neck: no JVD, carotid bruits, or masses Cardiac: RRR; no murmurs, rubs, or gallops,no edema  Respiratory:  clear to auscultation bilaterally, normal work of breathing GI: soft, nontender, nondistended, + BS MS: no deformity or atrophy Skin: warm and dry, no rash Neuro:  Strength and sensation  are intact Psych: euthymic mood, full affect   EKG:  EKG is not ordered today.    Recent Labs: No results found for requested labs within last 365 days.    Lipid Panel No results found for: CHOL, TRIG, HDL, CHOLHDL, VLDL, LDLCALC, LDLDIRECT    Wt Readings from Last 3 Encounters:  11/28/14 292 lb (132.45 kg)  05/09/13 292 lb 6.4 oz (132.632 kg)  01/02/13 304 lb (137.893 kg)      ASSESSMENT AND PLAN: 1.  OSA on CPAP tolerating well. I will get a d/l from her DME.  She would like to get a new machine.  Her device is 67 years old.  I will send an order into her DME. 2.  Morbid obesity 3.  Chronic diastolic CHF appears compensated -continue Diltiazem and HCTZ - check BMET  Current medicines are reviewed at length with the  patient today.  The patient does not have concerns regarding medicines.  The following changes have been made:  no change  Labs/ tests ordered today: See above Assessment and Plan No orders of the defined types were placed in this encounter.     Disposition:   FU with me in 1 year  Signed, [name], MD  11/28/2014 1:37 PM    [facility] [address], [place], [place]  [address] Phone: [phone]; Fax: [phone]

## 2014-11-28 ENCOUNTER — Telehealth: Payer: Self-pay | Admitting: Cardiology

## 2014-11-28 ENCOUNTER — Ambulatory Visit: Payer: Medicare Other | Admitting: Cardiology

## 2014-11-28 ENCOUNTER — Ambulatory Visit (INDEPENDENT_AMBULATORY_CARE_PROVIDER_SITE_OTHER): Payer: Medicare Other | Admitting: Cardiology

## 2014-11-28 ENCOUNTER — Encounter: Payer: Self-pay | Admitting: Cardiology

## 2014-11-28 VITALS — BP 128/90 | HR 75 | Ht 62.0 in | Wt 292.0 lb

## 2014-11-28 DIAGNOSIS — I119 Hypertensive heart disease without heart failure: Secondary | ICD-10-CM | POA: Diagnosis not present

## 2014-11-28 DIAGNOSIS — E669 Obesity, unspecified: Secondary | ICD-10-CM | POA: Diagnosis not present

## 2014-11-28 DIAGNOSIS — G4733 Obstructive sleep apnea (adult) (pediatric): Secondary | ICD-10-CM | POA: Diagnosis not present

## 2014-11-28 DIAGNOSIS — I5032 Chronic diastolic (congestive) heart failure: Secondary | ICD-10-CM | POA: Diagnosis not present

## 2014-11-28 LAB — BASIC METABOLIC PANEL
BUN: 14 mg/dL (ref 7–25)
CO2: 26 mmol/L (ref 20–31)
Calcium: 9.1 mg/dL (ref 8.6–10.4)
Chloride: 101 mmol/L (ref 98–110)
Creat: 0.65 mg/dL (ref 0.50–0.99)
Glucose, Bld: 103 mg/dL — ABNORMAL HIGH (ref 65–99)
Potassium: 3.9 mmol/L (ref 3.5–5.3)
Sodium: 139 mmol/L (ref 135–146)

## 2014-11-28 MED ORDER — DILTIAZEM HCL ER BEADS 180 MG PO CP24: 180.0000 mg | ORAL_CAPSULE | Freq: Every day | ORAL | Status: DC

## 2014-11-28 MED ORDER — HYDROCHLOROTHIAZIDE 25 MG PO TABS: 25.0000 mg | ORAL_TABLET | Freq: Every day | ORAL | Status: DC

## 2014-11-28 MED ORDER — HYDROCHLOROTHIAZIDE 25 MG PO TABS
25.0000 mg | ORAL_TABLET | Freq: Every day | ORAL | Status: DC
Start: 2014-11-28 — End: 2014-11-28

## 2014-11-28 NOTE — Addendum Note (Signed)
Addended by: Harland German A on: 11/28/2014 02:09 PM   Modules accepted: Orders

## 2014-11-28 NOTE — Patient Instructions (Signed)
Medication Instructions:  Your physician recommends that you continue on your current medications as directed. Please refer to the Current Medication list given to you today.   Labwork: TODAY: BMET  Testing/Procedures: None  Follow-Up: Your physician wants you to follow-up in: 1 year with Dr. Radford Pax. You will receive a reminder letter in the mail two months in advance. If you don't receive a letter, please call our office to schedule the follow-up appointment.   Any Other Special Instructions Will Be Listed Below (If Applicable). We have ordered a new CPAP. [facility] will be in touch with you soon to get you your device and supplies.  Romelle Starcher, CMA is our office CPAP assistant. Her direct number is [phone].

## 2014-11-28 NOTE — Telephone Encounter (Signed)
°  STAT if patient is at the pharmacy , call can be transferred to refill team.   1. Which medications need to be refilled? Hydrochlorothiazide 25mg  once a day  2. Which pharmacy/location is medication to be sent to? It need to be mail ordered to [facility]  3. Do they need a 30 day or 90 day supply? 90 day supply   STAT if patient is at the pharmacy , call can be transferred to refill team.   1. Which medications need to be refilled? Diltiazem hcler 180mg   2. Which pharmacy/location is medication to be sent to?Need to be mailed ordered to [facility]  3. Do they need a 30 day or 90 day supply? 90 day   Pt need to speak to nurse to fully explain.

## 2014-11-29 ENCOUNTER — Telehealth: Payer: Self-pay | Admitting: Cardiology

## 2014-11-29 NOTE — Telephone Encounter (Signed)
Informed patient of results and verbal understanding expressed.  

## 2014-11-29 NOTE — Telephone Encounter (Signed)
New Message ° ° ° °Pt is returning call  °

## 2014-11-29 NOTE — Telephone Encounter (Signed)
F/u ° ° °Pt returning your call °

## 2014-11-29 NOTE — Telephone Encounter (Signed)
-----   Message from [name], MD sent at 11/29/2014  3:05 PM EDT ----- Please let patient know that labs were normal.  Continue current medical therapy.

## 2014-12-12 ENCOUNTER — Telehealth: Payer: Self-pay | Admitting: Cardiology

## 2014-12-12 DIAGNOSIS — G4733 Obstructive sleep apnea (adult) (pediatric): Secondary | ICD-10-CM

## 2014-12-12 NOTE — Telephone Encounter (Signed)
Spoke with patient. She is insisting that Dr. Radford Pax write a RX for an auto-set machine. Jonni Sanger from [facility] is sending me over some notes so that I can forward this information to Dr. Radford Pax.

## 2014-12-12 NOTE — Telephone Encounter (Signed)
New Message    Pt calling stating she wants to speak to Dr. Theodosia Blender nurse when asked what the call is in regards to so I can send a message, she states, "I just need her to call me." Pt then states its about her C-pap, but pt will not discuss further. Please call back.

## 2014-12-13 NOTE — Telephone Encounter (Signed)
Patient is wanting an Office manager and she has been set on a current pressure of 12cm H2O. Per Jonni Sanger with [facility], she can only get an auto machine if Dr. Radford Pax feels it is clinically necessary - otherwise insurance will not allow a switch.  Will forward to Dr. Radford Pax for review.

## 2014-12-15 NOTE — Telephone Encounter (Signed)
OK to change to auto CPAP from 4 to 18cm H2O and get a d/l in 4 weeks

## 2014-12-18 ENCOUNTER — Encounter: Payer: Self-pay | Admitting: Cardiology

## 2014-12-18 NOTE — Telephone Encounter (Signed)
Informed patient auto CPAP is ordered and message sent to [facility].

## 2014-12-18 NOTE — Telephone Encounter (Signed)
New message      Talk to the nurse regarding her CPAP

## 2014-12-18 NOTE — Telephone Encounter (Signed)
This encounter was created in error - please disregard.

## 2014-12-18 NOTE — Addendum Note (Signed)
Addended by: Harland German A on: 12/18/2014 01:05 PM   Modules accepted: Orders

## 2014-12-19 ENCOUNTER — Telehealth: Payer: Self-pay | Admitting: Cardiology

## 2014-12-19 NOTE — Telephone Encounter (Signed)
Patient was informed yesterday (see previous phone note) that there was an order put in. Karen Rasmussen is aware of this.  Patient has been called back to let her know for sure that the order was done and [facility] was notified.

## 2014-12-19 NOTE — Telephone Encounter (Signed)
Follow Up  Pt request a call back to determine if the correction was made for the CPAP Rx. Please call back to follow up.

## 2015-01-01 ENCOUNTER — Telehealth: Payer: Self-pay | Admitting: Cardiology

## 2015-01-01 MED ORDER — HYDROCHLOROTHIAZIDE 25 MG PO TABS: 25.0000 mg | ORAL_TABLET | Freq: Every day | ORAL | Status: DC

## 2015-01-01 MED ORDER — DILTIAZEM HCL ER BEADS 180 MG PO CP24: 180.0000 mg | ORAL_CAPSULE | Freq: Every day | ORAL | Status: DC

## 2015-01-01 NOTE — Telephone Encounter (Signed)
Pt's Rx was sent to pt's pharmacy as requested. Confirmation received.  °

## 2015-01-01 NOTE — Telephone Encounter (Signed)
New message     *STAT* If patient is at the pharmacy, call can be transferred to refill team.   1. Which medications need to be refilled? (please list name of each medication and dose if known)  Diltiazem  180 mg /  Hydrodiuril  25 mg  2. Which pharmacy/location (including street and city if local pharmacy) is medication to be sent to? [facility]  - fax [phone] / phone [phone]  3. Do they need a 30 day or 90 day supply? 90 days

## 2015-01-07 ENCOUNTER — Telehealth: Payer: Self-pay | Admitting: *Deleted

## 2015-01-07 ENCOUNTER — Telehealth: Payer: Self-pay | Admitting: Cardiology

## 2015-01-07 MED ORDER — DILTIAZEM HCL ER BEADS 180 MG PO CP24: 180.0000 mg | ORAL_CAPSULE | Freq: Every day | ORAL | Status: DC

## 2015-01-07 MED ORDER — HYDROCHLOROTHIAZIDE 25 MG PO TABS: 25.0000 mg | ORAL_TABLET | Freq: Every day | ORAL | Status: DC

## 2015-01-07 NOTE — Telephone Encounter (Signed)
pt was confused with her pharmacy, straightened it out and she has [facility], will resend RX on her medications, pt is agreeable with plan.

## 2015-01-07 NOTE — Telephone Encounter (Signed)
[facility] & [facility], spoke with julia.Marland Kitchen

## 2015-01-07 NOTE — Telephone Encounter (Deleted)
Error

## 2015-01-13 DIAGNOSIS — K219 Gastro-esophageal reflux disease without esophagitis: Secondary | ICD-10-CM | POA: Diagnosis not present

## 2015-01-13 DIAGNOSIS — R197 Diarrhea, unspecified: Secondary | ICD-10-CM | POA: Diagnosis not present

## 2015-01-23 ENCOUNTER — Encounter: Payer: Self-pay | Admitting: Cardiology

## 2015-02-19 ENCOUNTER — Ambulatory Visit: Payer: Medicare Other | Admitting: Cardiology

## 2015-04-23 ENCOUNTER — Encounter: Payer: Self-pay | Admitting: Cardiology

## 2015-04-23 NOTE — Progress Notes (Signed)
Cardiology Office Note   Date:  04/24/2015   ID:  RONJA BERTOLINI, DOB 08-31-1945, MRN [id number]  PCP:  Tawanna Solo, MD    Chief Complaint  Patient presents with  . Sleep Apnea    no sx  . Congestive Heart Failure      History of Present Illness: ORIYAH WEAN is a 70 y.o. female with a history of morbid obesity, OSA and chronic diastolic CHF who presents today for followup. She denies any chest pain, SOB, DOE, LE edema, dizziness or palpitations. She does not get any physical activity. She tolerates the auto CPAP well. She uses the nasal pillow mask with chin strap which she tolerates well and feels the pressure is adequate. She feels rested in the am and has no daytime sleepiness. She denies any mouth dryness or nasal congestion. Since I saw her last, she has retired and is doing well.      Past Medical History  Diagnosis Date  . Abdominal pain, left lower quadrant   . OSA (obstructive sleep apnea)   . Benign hypertensive heart disease without heart failure   . Obesity   . GERD (gastroesophageal reflux disease)   . Hypersomnia   . Depression   . Asthmatic bronchitis   . Bursitis of hip   . Morbid obesity with BMI of 50.0-59.9, adult ([place])   . Chronic diastolic CHF (congestive heart failure) ([place])   . Vitamin D deficiency disease   . Kidney stones   . Diabetes mellitus without complication ([place])     History reviewed. No pertinent past surgical history.   Current Outpatient Prescriptions  Medication Sig Dispense Refill  . Cholecalciferol (VITAMIN D PO) Take 1 tablet by mouth daily.     Marland Kitchen diltiazem (TIAZAC) 180 MG 24 hr capsule Take 1 capsule (180 mg total) by mouth daily. 90 capsule 3  . Glucosamine 500 MG CAPS Take 1 capsule by mouth daily.    . hydrochlorothiazide (HYDRODIURIL) 25 MG tablet Take 1 tablet (25 mg total) by mouth daily. 90 tablet 3  . METFORMIN HCL PO Take 1 tablet by mouth daily.    . simvastatin (ZOCOR) 10 MG tablet  Take 10 mg by mouth daily.     No current facility-administered medications for this visit.    Allergies:   Review of patient's allergies indicates no known allergies.    Social History:  The patient  reports that she has never smoked. She does not have any smokeless tobacco history on file. She reports that she does not use illicit drugs.   Family History:  The patient's family history includes CAD in her mother; Cancer in her father; Diabetes in her brother and father; Heart failure in her father; Hypertension in her brother.    ROS:  Please see the history of present illness.   Otherwise, review of systems are positive for none.   All other systems are reviewed and negative.    PHYSICAL EXAM: VS:  BP 110/70 mmHg  Pulse 67  Ht 5\' 4"  (1.626 m)  Wt 291 lb (131.997 kg)  BMI 49.93 kg/m2  SpO2 96% , BMI Body mass index is 49.93 kg/(m^2). GEN: Well nourished, well developed, in no acute distress HEENT: normal Neck: no JVD, carotid bruits, or masses Cardiac: RRR; no murmurs, rubs, or gallops.  traceedema  Respiratory:  clear to auscultation bilaterally, normal work of breathing GI: soft, nontender, nondistended, +  BS MS: no deformity or atrophy Skin: warm and dry, no rash Neuro:  Strength and sensation are intact Psych: euthymic mood, full affect   EKG:  EKG is not ordered today.    Recent Labs: 11/28/2014: BUN 14; Creat 0.65; Potassium 3.9; Sodium 139    Lipid Panel No results found for: CHOL, TRIG, HDL, CHOLHDL, VLDL, LDLCALC, LDLDIRECT    Wt Readings from Last 3 Encounters:  04/24/15 291 lb (131.997 kg)  11/28/14 292 lb (132.45 kg)  05/09/13 292 lb 6.4 oz (132.632 kg)    ASSESSMENT AND PLAN: 1. OSA on CPAP tolerating well. Her d/l today showed an AHI of 1/hr on auto CPAP with 80% compliance in using more than 4 hours nightly.  2. Morbid obesity - I have encouraged her to get into a routine exercise routine and follow a low fat low carb diet with reduced  portions.  She would like to be referred for weight loss surgery.   3. Chronic diastolic CHF appears compensated - continue Diltiazem and HCTZ - check BMET   Current medicines are reviewed at length with the patient today.  The patient does not have concerns regarding medicines.  The following changes have been made:  no change  Labs/ tests ordered today: See above Assessment and Plan No orders of the defined types were placed in this encounter.     Disposition:   FU with me in 1 year  Signed, [name], MD  04/24/2015 8:52 AM    [facility] [address], [place], [place]  [address] Phone: [phone]; Fax: [phone]

## 2015-04-24 ENCOUNTER — Ambulatory Visit (INDEPENDENT_AMBULATORY_CARE_PROVIDER_SITE_OTHER): Payer: Medicare Other | Admitting: Cardiology

## 2015-04-24 ENCOUNTER — Encounter: Payer: Self-pay | Admitting: Cardiology

## 2015-04-24 ENCOUNTER — Ambulatory Visit: Payer: Medicare Other | Admitting: Cardiology

## 2015-04-24 VITALS — BP 110/70 | HR 67 | Ht 64.0 in | Wt 291.0 lb

## 2015-04-24 DIAGNOSIS — G4733 Obstructive sleep apnea (adult) (pediatric): Secondary | ICD-10-CM

## 2015-04-24 DIAGNOSIS — I5032 Chronic diastolic (congestive) heart failure: Secondary | ICD-10-CM

## 2015-04-24 DIAGNOSIS — Z6841 Body Mass Index (BMI) 40.0 and over, adult: Secondary | ICD-10-CM | POA: Diagnosis not present

## 2015-04-24 DIAGNOSIS — I509 Heart failure, unspecified: Secondary | ICD-10-CM | POA: Diagnosis not present

## 2015-04-24 LAB — BASIC METABOLIC PANEL
BUN: 13 mg/dL (ref 7–25)
CO2: 29 mmol/L (ref 20–31)
CREATININE: 0.76 mg/dL (ref 0.50–0.99)
Calcium: 9.4 mg/dL (ref 8.6–10.4)
Chloride: 104 mmol/L (ref 98–110)
Glucose, Bld: 147 mg/dL — ABNORMAL HIGH (ref 65–99)
Potassium: 3.7 mmol/L (ref 3.5–5.3)
Sodium: 142 mmol/L (ref 135–146)

## 2015-04-24 NOTE — Patient Instructions (Signed)
Medication Instructions:  Your physician recommends that you continue on your current medications as directed. Please refer to the Current Medication list given to you today.   Labwork: TODAY: BMET  Testing/Procedures: None  Follow-Up: Your physician wants you to follow-up in: 1 year with Dr. Radford Pax. You will receive a reminder letter in the mail two months in advance. If you don't receive a letter, please call our office to schedule the follow-up appointment.   Any Other Special Instructions Will Be Listed Below (If Applicable). Bariatric Surgery You have so much to gain by losing weight.  You may have already tried every diet and exercise plan imaginable.  And, you may have sought advice from your family physician, too.   Sometimes, in spite of such diligent efforts, you may not be able to achieve long-term results by yourself.  In cases of severe obesity, bariatric or weight loss surgery is a proven method of achieving long-term weight control.  Our Services Our bariatric surgery programs offer our patients new hope and long-term weight-loss solution.  Since introducing our services in 2003, we have conducted more than 2,400 successful procedures.  Our program is designated as a Programmer, multimedia by the Metabolic and Bariatric Surgery Accreditation and Quality Improvement Program (MBSAQIP), a IT trainer that sets rigorous patient safety and outcome standards.  Our program is also designated as a Ecologist by [employer].   Our exceptional weight-loss surgery team specializes in diagnosis, treatment, follow-up care, and ongoing support for our patients with severe weight loss challenges.  We currently offer laparoscopic sleeve gastrectomy, gastric bypass, and adjustable gastric band (LAP-BAND).    Attend our [place] Choosing to undergo a bariatric procedure is a big decision, and one that should not be taken lightly.  You now have two  options in how you learn about weight-loss surgery - in person or online.  Our objective is to ensure you have all of the information that you need to evaluate the advantages and obligations of this life changing procedure.  Please note that you are not alone in this process, and our experienced team is ready to assist and answer all of your questions.  There are several ways to register for a seminar (either on-line or in person): 1)  Call [phone] 2) Go on-line to [facility] and register for either type of seminar.  [email or web address]     If you need a refill on your cardiac medications before your next appointment, please call your pharmacy.

## 2015-04-25 ENCOUNTER — Ambulatory Visit: Payer: Medicare Other | Admitting: Cardiology

## 2015-04-25 NOTE — Addendum Note (Signed)
Addended by: Harland German A on: 04/25/2015 10:42 AM   Modules accepted: Orders, Medications

## 2015-05-27 ENCOUNTER — Telehealth: Payer: Self-pay | Admitting: *Deleted

## 2015-05-27 NOTE — Telephone Encounter (Signed)
Left message to call back  

## 2015-05-27 NOTE — Telephone Encounter (Signed)
Patient called and stated that she has joined a gym and has been exercising and also doing nutrisystem. She has been experiencing some pain with the physical activity and wanted to know if Dr Radford Pax would be willing to prescribe her some pain medication, at least enough to get her through until June when she sees her pcp for her yearly physical. She stated that she is trying to maintain her activity level, but it is difficult to do when she is in pain. Please advise. Thanks, MI

## 2015-05-28 NOTE — Telephone Encounter (Signed)
If this is CP she needs to see me.  If this is joint pain she needs to get it from her PCP

## 2015-05-28 NOTE — Telephone Encounter (Signed)
Called patient back. Patient denies any chest pain. Informed her to call her PCP for joint pain medication. Patient verbalized understanding.

## 2015-05-28 NOTE — Telephone Encounter (Signed)
Call nurse back.

## 2015-06-02 DIAGNOSIS — E119 Type 2 diabetes mellitus without complications: Secondary | ICD-10-CM | POA: Diagnosis not present

## 2015-06-02 DIAGNOSIS — H2513 Age-related nuclear cataract, bilateral: Secondary | ICD-10-CM | POA: Diagnosis not present

## 2015-06-06 DIAGNOSIS — M25551 Pain in right hip: Secondary | ICD-10-CM | POA: Diagnosis not present

## 2015-06-06 DIAGNOSIS — M1712 Unilateral primary osteoarthritis, left knee: Secondary | ICD-10-CM | POA: Diagnosis not present

## 2015-06-25 DIAGNOSIS — D225 Melanocytic nevi of trunk: Secondary | ICD-10-CM | POA: Diagnosis not present

## 2015-06-25 DIAGNOSIS — D2261 Melanocytic nevi of right upper limb, including shoulder: Secondary | ICD-10-CM | POA: Diagnosis not present

## 2015-06-25 DIAGNOSIS — D1801 Hemangioma of skin and subcutaneous tissue: Secondary | ICD-10-CM | POA: Diagnosis not present

## 2015-06-25 DIAGNOSIS — L570 Actinic keratosis: Secondary | ICD-10-CM | POA: Diagnosis not present

## 2015-06-25 DIAGNOSIS — L309 Dermatitis, unspecified: Secondary | ICD-10-CM | POA: Diagnosis not present

## 2015-06-25 DIAGNOSIS — D2262 Melanocytic nevi of left upper limb, including shoulder: Secondary | ICD-10-CM | POA: Diagnosis not present

## 2015-06-25 DIAGNOSIS — L821 Other seborrheic keratosis: Secondary | ICD-10-CM | POA: Diagnosis not present

## 2015-06-30 DIAGNOSIS — M1712 Unilateral primary osteoarthritis, left knee: Secondary | ICD-10-CM | POA: Diagnosis not present

## 2015-07-09 DIAGNOSIS — M1712 Unilateral primary osteoarthritis, left knee: Secondary | ICD-10-CM | POA: Diagnosis not present

## 2015-07-15 DIAGNOSIS — M8580 Other specified disorders of bone density and structure, unspecified site: Secondary | ICD-10-CM | POA: Diagnosis not present

## 2015-07-15 DIAGNOSIS — Z0000 Encounter for general adult medical examination without abnormal findings: Secondary | ICD-10-CM | POA: Diagnosis not present

## 2015-07-15 DIAGNOSIS — E7800 Pure hypercholesterolemia, unspecified: Secondary | ICD-10-CM | POA: Diagnosis not present

## 2015-07-15 DIAGNOSIS — E119 Type 2 diabetes mellitus without complications: Secondary | ICD-10-CM | POA: Diagnosis not present

## 2015-07-15 DIAGNOSIS — I519 Heart disease, unspecified: Secondary | ICD-10-CM | POA: Diagnosis not present

## 2015-07-15 DIAGNOSIS — Z6841 Body Mass Index (BMI) 40.0 and over, adult: Secondary | ICD-10-CM | POA: Diagnosis not present

## 2015-07-15 DIAGNOSIS — Z86010 Personal history of colonic polyps: Secondary | ICD-10-CM | POA: Diagnosis not present

## 2015-07-15 DIAGNOSIS — M859 Disorder of bone density and structure, unspecified: Secondary | ICD-10-CM | POA: Diagnosis not present

## 2015-07-15 DIAGNOSIS — I10 Essential (primary) hypertension: Secondary | ICD-10-CM | POA: Diagnosis not present

## 2015-07-15 DIAGNOSIS — Z209 Contact with and (suspected) exposure to unspecified communicable disease: Secondary | ICD-10-CM | POA: Diagnosis not present

## 2015-07-15 DIAGNOSIS — G4733 Obstructive sleep apnea (adult) (pediatric): Secondary | ICD-10-CM | POA: Diagnosis not present

## 2015-07-15 DIAGNOSIS — I119 Hypertensive heart disease without heart failure: Secondary | ICD-10-CM | POA: Diagnosis not present

## 2015-07-16 DIAGNOSIS — M1712 Unilateral primary osteoarthritis, left knee: Secondary | ICD-10-CM | POA: Diagnosis not present

## 2015-07-23 DIAGNOSIS — M1712 Unilateral primary osteoarthritis, left knee: Secondary | ICD-10-CM | POA: Diagnosis not present

## 2015-07-30 DIAGNOSIS — M1712 Unilateral primary osteoarthritis, left knee: Secondary | ICD-10-CM | POA: Diagnosis not present

## 2015-08-01 ENCOUNTER — Telehealth: Payer: Self-pay | Admitting: Cardiology

## 2015-08-01 NOTE — Telephone Encounter (Signed)
New Message  Pt calling to speak w/ RN- would not specify nature of phone call. Please call back and discuss.

## 2015-08-01 NOTE — Telephone Encounter (Signed)
Patient was wanting to know if an adjustable bed would be beneficial for her health. Informed patient that it would be beneficial to be able to elevate her legs if she has swelling, or have her head elevated to breath better if she has a hard time sleeping flat. Patient agreed, and had no other questions.

## 2015-08-07 DIAGNOSIS — M810 Age-related osteoporosis without current pathological fracture: Secondary | ICD-10-CM | POA: Diagnosis not present

## 2015-08-07 DIAGNOSIS — M8588 Other specified disorders of bone density and structure, other site: Secondary | ICD-10-CM | POA: Diagnosis not present

## 2015-08-13 ENCOUNTER — Telehealth: Payer: Self-pay | Admitting: Cardiology

## 2015-08-13 NOTE — Telephone Encounter (Signed)
New Message:   She wants Dr Radford Pax opinion about her getting an adjustable bed with all her health issues.What does she think,would it help her to have her head elevated?

## 2015-08-13 NOTE — Telephone Encounter (Signed)
If she is not having any problems with breathing at night I think she can continue with the same bed

## 2015-08-13 NOTE — Telephone Encounter (Signed)
Patient is buying a new mattress soon and wants Dr. Theodosia Blender opinion on whether or not to get an adjustable bed because of her CHF and OSA. She confirmed that she is not having any trouble breathing at night. She does use use 2 pillows to sleep, but for CPAP comfort, not breathing issues. Informed her that if she is sleeping well, breathing fine at night, and not having any LE swelling, having adjustable head and foot controls will not make much of a difference to her.  She still requests Dr. Theodosia Blender opinion prior to purchase.

## 2015-08-13 NOTE — Telephone Encounter (Signed)
Informed patient that per Dr. Radford Pax, if she is not having any problems breathing at night she can continue with same bed. She was grateful for call.

## 2015-08-20 DIAGNOSIS — S51852A Open bite of left forearm, initial encounter: Secondary | ICD-10-CM | POA: Diagnosis not present

## 2015-08-20 DIAGNOSIS — W5501XA Bitten by cat, initial encounter: Secondary | ICD-10-CM | POA: Diagnosis not present

## 2015-08-22 DIAGNOSIS — L918 Other hypertrophic disorders of the skin: Secondary | ICD-10-CM | POA: Diagnosis not present

## 2015-08-22 DIAGNOSIS — L821 Other seborrheic keratosis: Secondary | ICD-10-CM | POA: Diagnosis not present

## 2015-08-22 DIAGNOSIS — L820 Inflamed seborrheic keratosis: Secondary | ICD-10-CM | POA: Diagnosis not present

## 2015-08-22 DIAGNOSIS — L570 Actinic keratosis: Secondary | ICD-10-CM | POA: Diagnosis not present

## 2015-10-24 ENCOUNTER — Other Ambulatory Visit: Payer: Self-pay | Admitting: Family Medicine

## 2015-10-24 DIAGNOSIS — Z1231 Encounter for screening mammogram for malignant neoplasm of breast: Secondary | ICD-10-CM

## 2015-10-31 ENCOUNTER — Ambulatory Visit
Admission: RE | Admit: 2015-10-31 | Discharge: 2015-10-31 | Disposition: A | Discharge status: Home / Self Care | Payer: Medicare Other | Source: Ambulatory Visit | Attending: Family Medicine | Admitting: Family Medicine

## 2015-10-31 DIAGNOSIS — Z1231 Encounter for screening mammogram for malignant neoplasm of breast: Secondary | ICD-10-CM | POA: Diagnosis not present

## 2015-11-07 ENCOUNTER — Ambulatory Visit: Payer: Medicare Other

## 2015-11-16 DIAGNOSIS — Z23 Encounter for immunization: Secondary | ICD-10-CM | POA: Diagnosis not present

## 2016-01-12 DIAGNOSIS — L609 Nail disorder, unspecified: Secondary | ICD-10-CM | POA: Diagnosis not present

## 2016-01-12 DIAGNOSIS — M1711 Unilateral primary osteoarthritis, right knee: Secondary | ICD-10-CM | POA: Diagnosis not present

## 2016-01-13 ENCOUNTER — Other Ambulatory Visit: Payer: Self-pay | Admitting: Cardiology

## 2016-01-14 DIAGNOSIS — F5101 Primary insomnia: Secondary | ICD-10-CM | POA: Diagnosis not present

## 2016-01-14 DIAGNOSIS — Z6841 Body Mass Index (BMI) 40.0 and over, adult: Secondary | ICD-10-CM | POA: Diagnosis not present

## 2016-01-14 DIAGNOSIS — Z7984 Long term (current) use of oral hypoglycemic drugs: Secondary | ICD-10-CM | POA: Diagnosis not present

## 2016-01-14 DIAGNOSIS — E7800 Pure hypercholesterolemia, unspecified: Secondary | ICD-10-CM | POA: Diagnosis not present

## 2016-01-14 DIAGNOSIS — E559 Vitamin D deficiency, unspecified: Secondary | ICD-10-CM | POA: Diagnosis not present

## 2016-01-14 DIAGNOSIS — E119 Type 2 diabetes mellitus without complications: Secondary | ICD-10-CM | POA: Diagnosis not present

## 2016-01-14 DIAGNOSIS — M810 Age-related osteoporosis without current pathological fracture: Secondary | ICD-10-CM | POA: Diagnosis not present

## 2016-01-15 DIAGNOSIS — L609 Nail disorder, unspecified: Secondary | ICD-10-CM | POA: Diagnosis not present

## 2016-01-22 ENCOUNTER — Other Ambulatory Visit: Payer: Self-pay | Admitting: *Deleted

## 2016-01-22 ENCOUNTER — Telehealth: Payer: Self-pay | Admitting: *Deleted

## 2016-01-22 MED ORDER — DILTIAZEM HCL ER BEADS 180 MG PO CP24: 180.0000 mg | ORAL_CAPSULE | Freq: Every day | ORAL | 0 refills | Status: DC

## 2016-01-22 NOTE — Telephone Encounter (Signed)
Spoke with patient today and she was upset that [facility] indicated to her that they had made several attempts to obtain a refill of diltiazem for her with no response. Per epic the only request that was received was for hctz, which was refilled. She states that she had the hctz and the diltiazem set up to where they be due for a reorder at the same time. I apologized that this had happened and made her aware that I would send in the rx for her. She later called back and stated that she had contacted [facility] and she again stressed that they had made multiple attempts to reach the office for this refill. She also stated that per [facility],  they had two rxs on file for this medication and they did not know which one to refill. Another issue that they informed her of was there was a manufacturer change for the medication. Ultimately she claims that they will not refill this for her until they hear from Dr Radford Pax. I called [facility], spoke with [facility] and after a thirty minute call was informed that the only issue was that patients middle initial on the rx was different from what they have on file. While I was on hold [facility] contacted the patient and verified that her middle initial is "H". I am unsure of how this was suddenly an issue if they were able to send the patient the rx on 01/13/16 for hctz. Per [facility] they will process the rx and ship it to the patient and she requested that we update our records to reflect the patients middle initial as "H".  I have spoke with patient and she stated that she is not sure why we had it as "L". She stated that her legal name is Karen Rasmussen. She is aware that I have updated her chart.

## 2016-02-20 DIAGNOSIS — J069 Acute upper respiratory infection, unspecified: Secondary | ICD-10-CM | POA: Diagnosis not present

## 2016-04-12 DIAGNOSIS — D1801 Hemangioma of skin and subcutaneous tissue: Secondary | ICD-10-CM | POA: Diagnosis not present

## 2016-04-12 DIAGNOSIS — L821 Other seborrheic keratosis: Secondary | ICD-10-CM | POA: Diagnosis not present

## 2016-04-12 DIAGNOSIS — L570 Actinic keratosis: Secondary | ICD-10-CM | POA: Diagnosis not present

## 2016-04-25 ENCOUNTER — Encounter: Payer: Self-pay | Admitting: Cardiology

## 2016-04-26 ENCOUNTER — Telehealth: Payer: Self-pay | Admitting: Cardiology

## 2016-04-26 ENCOUNTER — Ambulatory Visit (INDEPENDENT_AMBULATORY_CARE_PROVIDER_SITE_OTHER): Payer: Medicare Other | Admitting: Cardiology

## 2016-04-26 ENCOUNTER — Encounter: Payer: Self-pay | Admitting: Cardiology

## 2016-04-26 VITALS — BP 156/76 | HR 79 | Ht 64.0 in | Wt 291.1 lb

## 2016-04-26 DIAGNOSIS — I4891 Unspecified atrial fibrillation: Secondary | ICD-10-CM | POA: Diagnosis not present

## 2016-04-26 DIAGNOSIS — Z6841 Body Mass Index (BMI) 40.0 and over, adult: Secondary | ICD-10-CM

## 2016-04-26 DIAGNOSIS — I10 Essential (primary) hypertension: Secondary | ICD-10-CM

## 2016-04-26 DIAGNOSIS — I5032 Chronic diastolic (congestive) heart failure: Secondary | ICD-10-CM | POA: Diagnosis not present

## 2016-04-26 DIAGNOSIS — I4819 Other persistent atrial fibrillation: Secondary | ICD-10-CM | POA: Insufficient documentation

## 2016-04-26 DIAGNOSIS — G4733 Obstructive sleep apnea (adult) (pediatric): Secondary | ICD-10-CM

## 2016-04-26 HISTORY — DX: Other persistent atrial fibrillation: I48.19

## 2016-04-26 HISTORY — DX: Essential (primary) hypertension: I10

## 2016-04-26 MED ORDER — RIVAROXABAN 20 MG PO TABS: 20.0000 mg | ORAL_TABLET | Freq: Every day | ORAL | 11 refills | Status: DC

## 2016-04-26 MED ORDER — RIVAROXABAN 20 MG PO TABS: 20.0000 mg | ORAL_TABLET | Freq: Every day | ORAL | 0 refills | Status: DC

## 2016-04-26 MED ORDER — DILTIAZEM HCL ER BEADS 240 MG PO CP24: 240.0000 mg | ORAL_CAPSULE | Freq: Every day | ORAL | 11 refills | Status: DC

## 2016-04-26 NOTE — Telephone Encounter (Signed)
Left message for patient that medications will be reevaluated at her appointment in 1 week and will be called in to mail order at that time if they are to be continued. Instructed her to call back with questions or concerns.

## 2016-04-26 NOTE — Progress Notes (Signed)
Cardiology Office Note    Date:  04/26/2016   ID:  Karen Rasmussen, DOB [birth year], MRN [id number]  PCP:  Karen Solo, MD  Cardiologist:  Karen Him, MD   Chief Complaint  Patient presents with  . Sleep Apnea  . Congestive Heart Failure  . Hypertension    History of Present Illness:  Karen Rasmussen is a 71 y.o. female with a history of morbid obesity, OSA and chronic diastolic CHF who presents today for followup. She denies any chest pain, SOB,  LE edema, dizziness or palpitations.She has mild DOE that she attributes to not being active since retirement.  She has had some edema of her hands over the past week but not her legs.  She tolerates the auto CPAP well. She uses the nasal pillow mask with chin strap which she tolerates well and feels the pressure is adequate. She feels rested in the am and has no daytime sleepiness. She denies any mouth dryness or nasal congestion.     Past Medical History:  Diagnosis Date  . Abdominal pain, left lower quadrant   . Asthmatic bronchitis   . Benign essential HTN 04/26/2016  . Benign hypertensive heart disease without heart failure   . Bursitis of hip   . Chronic diastolic CHF (congestive heart failure) ([facility])   . Depression   . Diabetes mellitus without complication ([place])   . GERD (gastroesophageal reflux disease)   . Hypersomnia   . Kidney stones   . Morbid obesity with BMI of 50.0-59.9, adult ([place])   . New onset atrial fibrillation ([place]) 04/26/2016  . Obesity   . OSA (obstructive sleep apnea)   . Vitamin D deficiency disease     No past surgical history on file.  Current Medications: Current Meds  Medication Sig  . Cholecalciferol (VITAMIN D PO) Take 1 tablet by mouth daily.   Marland Kitchen diltiazem (TIAZAC) 180 MG 24 hr capsule Take 1 capsule (180 mg total) by mouth daily.  . Glucosamine 500 MG CAPS Take 1 capsule by mouth daily.  . hydrochlorothiazide (HYDRODIURIL) 25 MG tablet Take 1 tablet (25 mg total) by mouth daily.  .  metFORMIN (GLUCOPHAGE) 500 MG tablet Take 500 mg by mouth daily.  . simvastatin (ZOCOR) 10 MG tablet Take 10 mg by mouth daily.    Allergies:   Patient has no known allergies.   Social History   Social History  . Marital status: Divorced    Spouse name: N/A  . Number of children: N/A  . Years of education: N/A   Social History Main Topics  . Smoking status: Never Smoker  . Smokeless tobacco: Never Used  . Alcohol use None     Comment: occasional  . Drug use: No  . Sexual activity: Not Asked   Other Topics Concern  . None   Social History Narrative  . None     Family History:  The patient's family history includes CAD in her mother; Cancer in her father; Diabetes in her brother and father; Heart failure in her father; Hypertension in her brother.   ROS:   Please see the history of present illness.    ROS All other systems reviewed and are negative.  No flowsheet data found.     PHYSICAL EXAM:   VS:  BP (!) 156/76   Pulse 79   Ht 5\' 4"  (1.626 m)   Wt 291 lb 1.9 oz (132.1 kg)   SpO2 97%   BMI 49.97 kg/m  GEN: Well nourished, well developed, in no acute distress  HEENT: normal  Neck: no JVD, carotid bruits, or masses Cardiac: irregularly irreuglar; no murmurs, rubs, or gallops.  Trace edema of LE and hands  Intact distal pulses bilaterally.  Respiratory:  clear to auscultation bilaterally, normal work of breathing GI: soft, nontender, nondistended, + BS MS: no deformity or atrophy  Skin: warm and dry, no rash Neuro:  Alert and Oriented x 3, Strength and sensation are intact Psych: euthymic mood, full affect  Wt Readings from Last 3 Encounters:  04/26/16 291 lb 1.9 oz (132.1 kg)  04/24/15 291 lb (132 kg)  11/28/14 292 lb (132.5 kg)      Studies/Labs Reviewed:   EKG:  EKG is ordered today and showed atrial fibrillation with CVR with anterior infarct and no ST changes  Recent Labs: No results found for requested labs within last 8760 hours.   Lipid  Panel No results found for: CHOL, TRIG, HDL, CHOLHDL, VLDL, LDLCALC, LDLDIRECT  Additional studies/ records that were reviewed today include:  CPAP download    ASSESSMENT:    1. OSA (obstructive sleep apnea)   2. Chronic diastolic CHF (congestive heart failure) ([place])   3. Morbid obesity with BMI of 40.0-44.9, adult ([place])   4. Benign essential HTN   5. New onset atrial fibrillation ([facility])      PLAN:  In order of problems listed above:  OSA - the patient is tolerating PAP therapy well without any problems. The PAP download was reviewed today and showed an AHI of 1.2/hr on auto CPAP with 90% compliance in using more than 4 hours nightly.  The patient has been using and benefiting from CPAP use and will continue to benefit from therapy.  Chronic diastolic CHF- she has some mild volume overload on exam today but her weight is stable.  She has some mild LE in hands and legs likely from dietary indiscretion from eating chips recently as well as new onset afib.  She will continue on HCTZ.  I encouraged her to try to maintain a low sodium diet.  Morbid obesity  - I have encouraged Rasmussen to get into a routine exercise program and cut back on carbs and portions.  4.   HTN - Bp borderline controlled on current meds. She will continue on CCB and diuretic.  I will increase cardiazem to 240mg  daily.  5.   New onset atrial fibrillation with CVR.  Her HR is 80bpm today.  Her CHADS2VASC score is 5.  I will start her on Xarelto 20mg  daily.  She will need to be anticoagulated for 4 weeks and then set up for DCCV.  I will have her seen back in 4 weeks to set up cardioversion.  I will get a 2D echo to assess LVF and LA size.  Check TSH.  I will increase her Cardizem CD to 240mg  daily.      Medication Adjustments/Labs and Tests Ordered: Current medicines are reviewed at length with the patient today.  Concerns regarding medicines are outlined above.  Medication changes, Labs and Tests ordered today are listed in  the Patient Instructions below.  There are no Patient Instructions on file for this visit.   Signed, Karen Him, MD  04/26/2016 1:36 PM    [facility] [address], [place], [place]  [address] Phone: [phone]; Fax: [phone]

## 2016-04-26 NOTE — Telephone Encounter (Signed)
New Message  MD-Miller prescribed medication to this patient for osteoporosis   Alendronate 70 mg once a week

## 2016-04-26 NOTE — Telephone Encounter (Signed)
No major drug interactions with alendronate. Pt should have been advised by the prescribing MD to take it with a full glass of water in the AM 30 mins prior to any other meds and to stay upright for at least 2 hours after.

## 2016-04-26 NOTE — Telephone Encounter (Signed)
New message    *STAT* If patient is at the pharmacy, call can be transferred to refill team.   1. Which medications need to be refilled? (please list name of each medication and dose if known) tiazac 240 mg  2. Which pharmacy/location (including street and city if local pharmacy) is medication to be sent to? [facility]  3. Do they need a 30 day or 90 day supply? 90 days-Pt states if she is going to continue this medication she needs more sent in. She says she already picked up the 30 day supply.

## 2016-04-26 NOTE — Telephone Encounter (Signed)
Left message for patient that med list is updated and records will be sent to [facility] to confirm medication is OK with cardiac meds. Informed her she'd be called if there is any interaction.  Instructed her to call with any further questions or concerns.

## 2016-04-26 NOTE — Patient Instructions (Addendum)
Medication Instructions:  1) START XARELTO 20 mg daily 2) INCREASE CARDIZEM to 240 mg daily  Labwork: TODAY: BMET, CBC  Testing/Procedures: Your physician has requested that you have an echocardiogram. Echocardiography is a painless test that uses sound waves to create images of your heart. It provides your doctor with information about the size and shape of your heart and how well your heart's chambers and valves are working. This procedure takes approximately one hour. There are no restrictions for this procedure.  Follow-Up: Your physician recommends that you schedule a follow-up appointment in 4 WEEKS with Dr. Theodosia Blender assistant.  Any Other Special Instructions Will Be Listed Below (If Applicable).  Electrical Cardioversion Electrical cardioversion is the delivery of a jolt of electricity to restore a normal rhythm to the heart. A rhythm that is too fast or is not regular keeps the heart from pumping well. In this procedure, sticky patches or metal paddles are placed on the chest to deliver electricity to the heart from a device. This procedure may be done in an emergency if:  There is low or no blood pressure as a result of the heart rhythm.  Normal rhythm must be restored as fast as possible to protect the brain and heart from further damage.  It may save a life. This procedure may also be done for irregular or fast heart rhythms that are not immediately life-threatening. Tell a health care provider about:  Any allergies you have.  All medicines you are taking, including vitamins, herbs, eye drops, creams, and over-the-counter medicines.  Any problems you or family members have had with anesthetic medicines.  Any blood disorders you have.  Any surgeries you have had.  Any medical conditions you have.  Whether you are pregnant or may be pregnant. What are the risks? Generally, this is a safe procedure. However, problems may occur, including:  Allergic reactions to  medicines.  A blood clot that breaks free and travels to other parts of your body.  The possible return of an abnormal heart rhythm within hours or days after the procedure.  Your heart stopping (cardiac arrest). This is rare. What happens before the procedure? Medicines   Your health care provider may have you start taking:  Blood-thinning medicines (anticoagulants) so your blood does not clot as easily.  Medicines may be given to help stabilize your heart rate and rhythm.  Ask your health care provider about changing or stopping your regular medicines. This is especially important if you are taking diabetes medicines or blood thinners. General instructions   Plan to have someone take you home from the hospital or clinic.  If you will be going home right after the procedure, plan to have someone with you for 24 hours.  Follow instructions from your health care provider about eating or drinking restrictions. What happens during the procedure?  To lower your risk of infection:  Your health care team will wash or sanitize their hands.  Your skin will be washed with soap.  An IV tube will be inserted into one of your veins.  You will be given a medicine to help you relax (sedative).  Sticky patches (electrodes) or metal paddles may be placed on your chest.  An electrical shock will be delivered. The procedure may vary among health care providers and hospitals. What happens after the procedure?  Your blood pressure, heart rate, breathing rate, and blood oxygen level will be monitored until the medicines you were given have worn off.  Do not drive for  24 hours if you were given a sedative.  Your heart rhythm will be watched to make sure it does not change. This information is not intended to replace advice given to you by your health care provider. Make sure you discuss any questions you have with your health care provider. Document Released: 01/15/2002 Document Revised:  09/24/2015 Document Reviewed: 08/01/2015 Elsevier Interactive Patient Education  2017 [employer].     If you need a refill on your cardiac medications before your next appointment, please call your pharmacy.

## 2016-04-28 NOTE — Telephone Encounter (Signed)
Left message for patient that there are no major interactions with her cardiac medications and new drug she called in to report. Instructed her to call back if she has any questions about new drug administration if prescribing MD did not review.

## 2016-04-29 ENCOUNTER — Telehealth: Payer: Self-pay | Admitting: Cardiology

## 2016-04-29 NOTE — Telephone Encounter (Signed)
Left message to call back  

## 2016-04-29 NOTE — Telephone Encounter (Signed)
New Message     Please call pt was put on new medication by Dr Radford Pax, and she has a few concerns she would like to speak to you about

## 2016-05-03 ENCOUNTER — Telehealth: Payer: Self-pay | Admitting: Cardiology

## 2016-05-03 DIAGNOSIS — R609 Edema, unspecified: Secondary | ICD-10-CM

## 2016-05-03 DIAGNOSIS — I4891 Unspecified atrial fibrillation: Secondary | ICD-10-CM

## 2016-05-03 NOTE — Telephone Encounter (Signed)
New Message  Pt c/o swelling: STAT is pt has developed SOB within 24 hours  1. How long have you been experiencing swelling? Before last appt with Dr. Radford Pax 04/26/16  2. Where is the swelling located? Right hand;  Right finger   3.  Are you currently taking a "fluid pill"? no   4.  Are you currently SOB? No   5.  Have you traveled recently? no

## 2016-05-03 NOTE — Telephone Encounter (Signed)
Left message to call back  

## 2016-05-03 NOTE — Telephone Encounter (Signed)
See 3/26 phone encounter.

## 2016-05-04 NOTE — Telephone Encounter (Signed)
Left message to call back  

## 2016-05-04 NOTE — Telephone Encounter (Signed)
Change Xarelto to eliquis 5mg  BID and let us know if abdominal pain resolves.  She should not take Eliquis until 24 hours after last dose of Xarelto.  If abdominal pain does not resolve then will change Cardizem

## 2016-05-04 NOTE — Telephone Encounter (Signed)
Patient states she hasn't felt well since her medications were changed by Dr. Radford Pax (she started Xarelto and increased diltiazem to 240 mg on 3/19).  She says she cannot eat anything without feeling ill because of abdominal pain. She denies nausea, vomiting, and diarrhea and says her stool look "normal." Instructed her to see her PCP for evaluation of ABD pain associated with eating.   She also reports her R hand is still swollen and is only slightly better than when she saw Dr. Radford Pax. She still cannot wear her rings. She is following a low salt diet and is taking her medications as instructed.  She requests further recommendations from Dr. Radford Pax to reduce swelling.  She did not go to lab to have labs drawn (BMET, CBC, TSH) at visit. Scheduled patient to come in tomorrow to complete.  To Dr. Radford Pax to review and advise.

## 2016-05-04 NOTE — Telephone Encounter (Signed)
Patient now reports she feels better and does not want to change medications at this time. She understands she has other options if symptoms return. Per Dr. Radford Pax, instructed patient to see PCP for hand swelling. Per Dr. Radford Pax, BNP added on to labs tomorrow for swelling.

## 2016-05-05 ENCOUNTER — Other Ambulatory Visit: Payer: Medicare Other | Admitting: *Deleted

## 2016-05-05 DIAGNOSIS — I4891 Unspecified atrial fibrillation: Secondary | ICD-10-CM | POA: Diagnosis not present

## 2016-05-05 DIAGNOSIS — R609 Edema, unspecified: Secondary | ICD-10-CM

## 2016-05-06 ENCOUNTER — Telehealth: Payer: Self-pay | Admitting: Cardiology

## 2016-05-06 ENCOUNTER — Telehealth: Payer: Self-pay

## 2016-05-06 DIAGNOSIS — I5032 Chronic diastolic (congestive) heart failure: Secondary | ICD-10-CM

## 2016-05-06 LAB — TSH: TSH: 3.2 u[IU]/mL (ref 0.450–4.500)

## 2016-05-06 LAB — BASIC METABOLIC PANEL
BUN / CREAT RATIO: 15 (ref 12–28)
BUN: 13 mg/dL (ref 8–27)
CO2: 24 mmol/L (ref 18–29)
CREATININE: 0.87 mg/dL (ref 0.57–1.00)
Calcium: 9.7 mg/dL (ref 8.7–10.3)
Chloride: 97 mmol/L (ref 96–106)
GFR, EST AFRICAN AMERICAN: 79 mL/min/{1.73_m2} (ref >59)
GFR, EST NON AFRICAN AMERICAN: 69 mL/min/{1.73_m2} (ref >59)
Glucose: 144 mg/dL — ABNORMAL HIGH (ref 65–99)
Potassium: 3.3 mmol/L — ABNORMAL LOW (ref 3.5–5.2)
Sodium: 142 mmol/L (ref 134–144)

## 2016-05-06 LAB — CBC WITH DIFFERENTIAL/PLATELET
Basophils Absolute: 0.1 10*3/uL (ref 0.0–0.2)
Basos: 1 %
EOS (ABSOLUTE): 0.1 10*3/uL (ref 0.0–0.4)
EOS: 1 %
HEMOGLOBIN: 13.9 g/dL (ref 11.1–15.9)
Hematocrit: 42.5 % (ref 34.0–46.6)
Immature Grans (Abs): 0 10*3/uL (ref 0.0–0.1)
Immature Granulocytes: 0 %
LYMPHS ABS: 3.1 10*3/uL (ref 0.7–3.1)
Lymphs: 30 %
MCH: 28.3 pg (ref 26.6–33.0)
MCHC: 32.7 g/dL (ref 31.5–35.7)
MCV: 87 fL (ref 79–97)
Monocytes Absolute: 0.7 10*3/uL (ref 0.1–0.9)
Monocytes: 7 %
NEUTROS ABS: 6.5 10*3/uL (ref 1.4–7.0)
Neutrophils: 61 %
Platelets: 288 10*3/uL (ref 150–379)
RBC: 4.91 x10E6/uL (ref 3.77–5.28)
RDW: 14.3 % (ref 12.3–15.4)
WBC: 10.6 10*3/uL (ref 3.4–10.8)

## 2016-05-06 LAB — PRO B NATRIURETIC PEPTIDE: NT-PRO BNP: 314 pg/mL — AB (ref 0–301)

## 2016-05-06 MED ORDER — POTASSIUM CHLORIDE CRYS ER 20 MEQ PO TBCR: 20.0000 meq | EXTENDED_RELEASE_TABLET | Freq: Every day | ORAL | 3 refills | Status: DC

## 2016-05-06 NOTE — Telephone Encounter (Signed)
Patient is calling with question concerning a drug interference. She states that she would like to make sure the potassium medication will not interfere with another medication. Thanks.

## 2016-05-06 NOTE — Telephone Encounter (Signed)
Informed patient of results and verbal understanding expressed.  Instructed patient to START POTASSIUM  20 meq daily. She states she will not get her medication until tonight. She will take 2 pills tomorrow and 2 pills in the morning then 1 daily. BMET scheduled 4/4. Patient agrees with treatment plan.

## 2016-05-06 NOTE — Telephone Encounter (Signed)
-----   Message from [name], MD sent at 05/05/2016 10:24 PM EDT ----- Please start Peridot 68meq - take 2 tablets on am of 3/29 and 2 tablets on PM of 3/29 then 1 tablet daily starting 3/30 and repeat BMEt in 1 week

## 2016-05-06 NOTE — Telephone Encounter (Signed)
Patent wanted to be sure it was ok to take a dulcolax tablet today since she has not had a bowel movement this week.   Advised there would be no medication interaction but if she ends up having diarrhea that would be a different concern in regard to her potassium level.  She understands and is only going to take one tablet to start.  Also asked if she was going to have another EKG at her return ov.  Advised that would be up to provider at the time of the visit.  She was in afib at yesterday's visit and this is new per the patient.

## 2016-05-10 ENCOUNTER — Encounter: Payer: Self-pay | Admitting: Physician Assistant

## 2016-05-12 ENCOUNTER — Other Ambulatory Visit (HOSPITAL_COMMUNITY): Payer: Medicare Other

## 2016-05-12 ENCOUNTER — Other Ambulatory Visit: Payer: Medicare Other

## 2016-05-25 ENCOUNTER — Ambulatory Visit: Payer: Medicare Other | Admitting: Physician Assistant

## 2016-05-26 ENCOUNTER — Encounter (INDEPENDENT_AMBULATORY_CARE_PROVIDER_SITE_OTHER): Payer: Self-pay

## 2016-05-26 ENCOUNTER — Other Ambulatory Visit: Payer: Self-pay

## 2016-05-26 ENCOUNTER — Other Ambulatory Visit: Payer: Medicare Other

## 2016-05-26 ENCOUNTER — Ambulatory Visit (HOSPITAL_COMMUNITY): Payer: Medicare Other | Attending: Cardiovascular Disease

## 2016-05-26 ENCOUNTER — Encounter: Payer: Self-pay | Admitting: Cardiology

## 2016-05-26 DIAGNOSIS — I517 Cardiomegaly: Secondary | ICD-10-CM | POA: Diagnosis not present

## 2016-05-26 DIAGNOSIS — I5032 Chronic diastolic (congestive) heart failure: Secondary | ICD-10-CM | POA: Diagnosis not present

## 2016-05-26 DIAGNOSIS — I4891 Unspecified atrial fibrillation: Secondary | ICD-10-CM

## 2016-05-26 DIAGNOSIS — I361 Nonrheumatic tricuspid (valve) insufficiency: Secondary | ICD-10-CM | POA: Insufficient documentation

## 2016-05-26 DIAGNOSIS — I420 Dilated cardiomyopathy: Secondary | ICD-10-CM | POA: Insufficient documentation

## 2016-05-26 LAB — BASIC METABOLIC PANEL
BUN / CREAT RATIO: 23 (ref 12–28)
BUN: 16 mg/dL (ref 8–27)
CO2: 24 mmol/L (ref 18–29)
CREATININE: 0.69 mg/dL (ref 0.57–1.00)
Calcium: 9 mg/dL (ref 8.7–10.3)
Chloride: 100 mmol/L (ref 96–106)
GFR calc Af Amer: 103 mL/min/{1.73_m2} (ref >59)
GFR calc non Af Amer: 90 mL/min/{1.73_m2} (ref >59)
GLUCOSE: 151 mg/dL — AB (ref 65–99)
Potassium: 4.1 mmol/L (ref 3.5–5.2)
SODIUM: 142 mmol/L (ref 134–144)

## 2016-05-27 ENCOUNTER — Telehealth: Payer: Self-pay

## 2016-05-27 DIAGNOSIS — I5032 Chronic diastolic (congestive) heart failure: Secondary | ICD-10-CM

## 2016-05-27 DIAGNOSIS — I420 Dilated cardiomyopathy: Secondary | ICD-10-CM

## 2016-05-27 DIAGNOSIS — R931 Abnormal findings on diagnostic imaging of heart and coronary circulation: Secondary | ICD-10-CM

## 2016-05-27 NOTE — Telephone Encounter (Signed)
-----   Message from [name], MD sent at 05/26/2016  8:51 PM EDT ----- Please set up Karen Rasmussen to rule out ischemia given mild LV dysfunction

## 2016-05-27 NOTE — Telephone Encounter (Signed)
Informed patient of results and verbal understanding expressed.   Leane Call ordered for scheduling. Instructions reviewed with patient. She has no questions. She understands it will be a 2 day test. Patient agrees with treatment plan.

## 2016-05-31 ENCOUNTER — Ambulatory Visit (HOSPITAL_COMMUNITY): Payer: Medicare Other | Attending: Cardiology

## 2016-05-31 ENCOUNTER — Telehealth: Payer: Self-pay | Admitting: Cardiology

## 2016-05-31 DIAGNOSIS — I5032 Chronic diastolic (congestive) heart failure: Secondary | ICD-10-CM | POA: Diagnosis not present

## 2016-05-31 DIAGNOSIS — R931 Abnormal findings on diagnostic imaging of heart and coronary circulation: Secondary | ICD-10-CM | POA: Diagnosis not present

## 2016-05-31 DIAGNOSIS — I5042 Chronic combined systolic (congestive) and diastolic (congestive) heart failure: Secondary | ICD-10-CM | POA: Diagnosis not present

## 2016-05-31 DIAGNOSIS — E119 Type 2 diabetes mellitus without complications: Secondary | ICD-10-CM | POA: Diagnosis not present

## 2016-05-31 DIAGNOSIS — Z8249 Family history of ischemic heart disease and other diseases of the circulatory system: Secondary | ICD-10-CM | POA: Diagnosis not present

## 2016-05-31 DIAGNOSIS — I110 Hypertensive heart disease with heart failure: Secondary | ICD-10-CM | POA: Insufficient documentation

## 2016-05-31 DIAGNOSIS — I4891 Unspecified atrial fibrillation: Secondary | ICD-10-CM | POA: Diagnosis not present

## 2016-05-31 DIAGNOSIS — I420 Dilated cardiomyopathy: Secondary | ICD-10-CM | POA: Diagnosis not present

## 2016-05-31 MED ORDER — TECHNETIUM TC 99M TETROFOSMIN IV KIT
33.0000 | PACK | Freq: Once | INTRAVENOUS | Status: AC | PRN
  Administered 2016-05-31: 33 via INTRAVENOUS
  Filled 2016-05-31: qty 33

## 2016-05-31 MED ORDER — REGADENOSON 0.4 MG/5ML IV SOLN
0.4000 mg | Freq: Once | INTRAVENOUS | Status: AC
  Administered 2016-05-31: 0.4 mg via INTRAVENOUS

## 2016-05-31 NOTE — Telephone Encounter (Signed)
Patient states her IV site is a little bruised and swollen where her injection was inserted for her stress test. Confirmed with patient there is no redness or purulent drainage. Told her she soul try ice and light pressure to minimize swelling and bruising and inflammation. She was grateful for call.

## 2016-05-31 NOTE — Telephone Encounter (Signed)
New message    Pt is calling because she said where she had the IV her hand is blue, swollen and the size of a dime. She is calling to find out if that is normal.

## 2016-06-01 ENCOUNTER — Ambulatory Visit (HOSPITAL_COMMUNITY): Payer: Medicare Other | Attending: Cardiovascular Disease

## 2016-06-01 LAB — MYOCARDIAL PERFUSION IMAGING
LHR: 0.28
NUC STRESS TID: 0.87
Peak HR: 130 {beats}/min
Rest HR: 100 {beats}/min
SDS: 6
SRS: 9
SSS: 15

## 2016-06-01 MED ORDER — TECHNETIUM TC 99M TETROFOSMIN IV KIT
32.1000 | PACK | Freq: Once | INTRAVENOUS | Status: AC | PRN
  Administered 2016-06-01: 32.1 via INTRAVENOUS
  Filled 2016-06-01: qty 33

## 2016-06-02 ENCOUNTER — Encounter: Payer: Self-pay | Admitting: Family Medicine

## 2016-06-02 ENCOUNTER — Telehealth: Payer: Self-pay

## 2016-06-02 DIAGNOSIS — R9439 Abnormal result of other cardiovascular function study: Secondary | ICD-10-CM

## 2016-06-02 DIAGNOSIS — I519 Heart disease, unspecified: Secondary | ICD-10-CM

## 2016-06-02 DIAGNOSIS — R931 Abnormal findings on diagnostic imaging of heart and coronary circulation: Secondary | ICD-10-CM

## 2016-06-02 DIAGNOSIS — E119 Type 2 diabetes mellitus without complications: Secondary | ICD-10-CM | POA: Diagnosis not present

## 2016-06-02 DIAGNOSIS — R0602 Shortness of breath: Secondary | ICD-10-CM

## 2016-06-02 NOTE — Telephone Encounter (Signed)
This encounter was created in error - please disregard.

## 2016-06-02 NOTE — Telephone Encounter (Signed)
-----   Message from [name], MD sent at 06/02/2016  1:27 PM EDT ----- No ischemia on nuclear stress test but ? Infarct vs. Attenuation.  She has mild LV dysfunction on echo.  Please get a coronary CTA with morphology and FFR and calcium score.  I would like this done after we do her cardioversion.

## 2016-06-02 NOTE — Telephone Encounter (Signed)
New Message  Pt voiced calling to get results.  Please f/u

## 2016-06-02 NOTE — Telephone Encounter (Signed)
Informed patient of results and verbal understanding expressed.  Patient states she wishes to speak with APP about this in further detail at [facility] tomorrow, but agrees to order the test at this time. Cardiac CT ordered for scheduling. Patient agrees with treatment plan.

## 2016-06-03 ENCOUNTER — Encounter: Payer: Self-pay | Admitting: Physician Assistant

## 2016-06-03 ENCOUNTER — Ambulatory Visit (INDEPENDENT_AMBULATORY_CARE_PROVIDER_SITE_OTHER): Payer: Medicare Other | Admitting: Physician Assistant

## 2016-06-03 ENCOUNTER — Encounter: Payer: Self-pay | Admitting: *Deleted

## 2016-06-03 VITALS — BP 126/80 | HR 78 | Ht 62.5 in | Wt 288.4 lb

## 2016-06-03 DIAGNOSIS — I5032 Chronic diastolic (congestive) heart failure: Secondary | ICD-10-CM | POA: Diagnosis not present

## 2016-06-03 DIAGNOSIS — I10 Essential (primary) hypertension: Secondary | ICD-10-CM | POA: Diagnosis not present

## 2016-06-03 DIAGNOSIS — I4891 Unspecified atrial fibrillation: Secondary | ICD-10-CM | POA: Diagnosis not present

## 2016-06-03 DIAGNOSIS — Z6841 Body Mass Index (BMI) 40.0 and over, adult: Secondary | ICD-10-CM

## 2016-06-03 DIAGNOSIS — I420 Dilated cardiomyopathy: Secondary | ICD-10-CM

## 2016-06-03 MED ORDER — POTASSIUM CHLORIDE CRYS ER 20 MEQ PO TBCR: 20.0000 meq | EXTENDED_RELEASE_TABLET | Freq: Every day | ORAL | 3 refills | Status: DC

## 2016-06-03 MED ORDER — DILTIAZEM HCL ER BEADS 240 MG PO CP24: 240.0000 mg | ORAL_CAPSULE | Freq: Every day | ORAL | 3 refills | Status: DC

## 2016-06-03 MED ORDER — RIVAROXABAN 20 MG PO TABS: 20.0000 mg | ORAL_TABLET | Freq: Every day | ORAL | 1 refills | Status: DC

## 2016-06-03 NOTE — Patient Instructions (Addendum)
Medication Instructions:   Your physician recommends that you continue on your current medications as directed. Please refer to the Current Medication list given to you today.   If you need a refill on your cardiac medications before your next appointment, please call your pharmacy.  Labwork: CBC BMET AND  PT INR   Testing/Procedures: SEE LETTER FOR CARIOVERSION [phone]    AFTER CARDIOVERSION .Marland KitchenMarland KitchenCardiac CT scanning, (CAT scanning), is a noninvasive, special x-ray that produces cross-sectional images of the body using x-rays and a computer. CT scans help physicians diagnose and treat medical conditions. For some CT exams, a contrast material is used to enhance visibility in the area of the body being studied. CT scans provide greater clarity and reveal more details than regular x-ray exams.   Follow-Up:  WITH DR TURNER A MONTH AFTER  CARDIOVERSION   Any Other Special Instructions Will Be Listed Below (If Applicable).

## 2016-06-03 NOTE — Progress Notes (Signed)
Cardiology Office Note    Date:  06/03/2016   ID:  Karen Rasmussen, DOB October 30, 1945, MRN [id number]  PCP:  Tawanna Solo, MD  Cardiologist:   Chief Complaint  Patient presents with  . Follow-up    Afib/Chronic diastolic CHF    History of Present Illness:  Karen Rasmussen is a 71 y.o. female with a history of morbid obesity, OSA and chronic diastolic CHF, Hypertension, new onset atrial fibrillation with controlled ventricular rate at office visit 04/26/16. CHADSVASC=5. She was started on Xarelto 20 mg daily and Cardizem was increased to 240 mg daily. Plan was to get a 2-D echo and cardioversion in 4 weeks. 2-D echo showed mild reduced LV function EF 45-50% with moderate LVH and trivial TR. Lexi scan Myoview then was ordered. This showed a medium defect of moderate severity present in the basal anterior septal, basal inferior, mid inferior and apical inferior location. It was felt to be a low risk study. Because she has mild LV dysfunction on echo Dr. Radford Pax recommended coronary CTA with morphology and FFR and calcium score after cardioversion .  Patient comes in today for follow-up. She remains in atrial fibrillation. She's had a couple episodes when walking where she becomes short of breath, her face turns red and she is drenched in sweat. She denies any rapid heart rate since diltiazem was increased. She hasn't missed any of her doses of Xarelto in the past month. 2-D echo and Lexi scan Myoview reviewed in detail with her.     Past Medical History:  Diagnosis Date  . Abdominal pain, left lower quadrant   . Asthmatic bronchitis   . Benign essential HTN 04/26/2016  . Benign hypertensive heart disease without heart failure   . Bursitis of hip   . Chronic diastolic CHF (congestive heart failure) ([place])   . DCM (dilated cardiomyopathy) ([place])    EF 45-50%  . Depression   . Diabetes mellitus without complication ([place])   . GERD (gastroesophageal reflux disease)   . Hypersomnia   . Kidney  stones   . Morbid obesity with BMI of 50.0-59.9, adult ([place])   . New onset atrial fibrillation ([place]) 04/26/2016  . Obesity   . OSA (obstructive sleep apnea)   . Vitamin D deficiency disease     No past surgical history on file.  Current Medications: Outpatient Medications Prior to Visit  Medication Sig Dispense Refill  . alendronate (FOSAMAX) 70 MG tablet Take 70 mg by mouth once a week. Take with a full glass of water on an empty stomach.    . Cholecalciferol (VITAMIN D PO) Take 1 tablet by mouth daily.     . Glucosamine 500 MG CAPS Take 1 capsule by mouth daily.    . hydrochlorothiazide (HYDRODIURIL) 25 MG tablet Take 1 tablet (25 mg total) by mouth daily. 90 tablet 0  . metFORMIN (GLUCOPHAGE) 500 MG tablet Take 500 mg by mouth daily.    . simvastatin (ZOCOR) 10 MG tablet Take 10 mg by mouth daily.    Marland Kitchen diltiazem (TIAZAC) 240 MG 24 hr capsule Take 1 capsule (240 mg total) by mouth daily. 30 capsule 11  . potassium chloride SA (K-DUR,KLOR-CON) 20 MEQ tablet Take 1 tablet (20 mEq total) by mouth daily. 90 tablet 3  . rivaroxaban (XARELTO) 20 MG TABS tablet Take 1 tablet (20 mg total) by mouth daily with supper. 30 tablet 11   No facility-administered medications prior to visit.      Allergies:   Patient  has no known allergies.   Social History   Social History  . Marital status: Divorced    Spouse name: N/A  . Number of children: N/A  . Years of education: N/A   Social History Main Topics  . Smoking status: Never Smoker  . Smokeless tobacco: Never Used  . Alcohol use None     Comment: occasional  . Drug use: No  . Sexual activity: Not Asked   Other Topics Concern  . None   Social History Narrative  . None     Family History:  The patient's family history includes CAD in her mother; Cancer in her father; Diabetes in her brother and father; Heart failure in her father; Hypertension in her brother.   ROS:   Please see the history of present illness.    Review of  Systems  Constitution: Negative.  HENT: Negative.   Eyes: Negative.   Cardiovascular: Positive for dyspnea on exertion, irregular heartbeat and palpitations.  Respiratory: Negative.   Hematologic/Lymphatic: Negative.   Musculoskeletal: Negative.  Negative for joint pain.  Gastrointestinal: Negative.   Genitourinary: Negative.   Neurological: Negative.    All other systems reviewed and are negative.   PHYSICAL EXAM:   VS:  BP 126/80   Pulse 78   Ht 5' 2.5" (1.588 m)   Wt 288 lb 6.4 oz (130.8 kg)   BMI 51.91 kg/m   Physical Exam  GEN: Obese, in no acute distress  Neck: no JVD, carotid bruits, or masses Cardiac: Irregular irregular, distant heart sounds no murmurs, rubs, or gallops  Respiratory:  clear to auscultation bilaterally, normal work of breathing GI: soft, nontender, nondistended, + BS Ext: without cyanosis, clubbing, or edema, Good distal pulses bilaterally Neuro:  Alert and Oriented x 3 Psych: euthymic mood, full affect  Wt Readings from Last 3 Encounters:  06/03/16 288 lb 6.4 oz (130.8 kg)  05/31/16 291 lb (132 kg)  04/26/16 291 lb 1.9 oz (132.1 kg)      Studies/Labs Reviewed:   EKG:  EKG is ordered today.  The ekg ordered today demonstrates Atrial fibrillation at 78 bpm with left anterior fascicular block and poor R-wave progression anteriorly  Recent Labs: 05/05/2016: NT-Pro BNP 314; Platelets 288; TSH 3.200 05/26/2016: BUN 16; Creatinine, Ser 0.69; Potassium 4.1; Sodium 142   Lipid Panel No results found for: CHOL, TRIG, HDL, CHOLHDL, VLDL, LDLCALC, LDLDIRECT  Additional studies/ records that were reviewed today include:  Lexi scan Myoview 06/01/16 Study Highlights     There was no ST segment deviation noted during stress.  Defect 1: There is a medium defect of moderate severity present in the basal anteroseptal, basal inferior, mid inferior and apical inferior location.  This is a low risk study.   Low risk stress nuclear study with fixed defects  in the basal septum and inferior walls (attenuation vs infarct); no ischemia; study not gated due to atrial fibrillation; suggest echo to assess LV function.     2-D echo 05/26/16 Study Conclusions   - Left ventricle: The cavity size was normal. There was moderate   concentric hypertrophy. Systolic function was mildly reduced. The   estimated ejection fraction was in the range of 45% to 50%.   Diffuse hypokinesis. - Aortic valve: Transvalvular velocity was within the normal range.   There was no stenosis. There was no regurgitation. - Mitral valve: Transvalvular velocity was within the normal range.   There was no evidence for stenosis. There was no regurgitation. - Right ventricle: The  cavity size was normal. Wall thickness was   normal. Systolic function was normal. - Atrial septum: No defect or patent foramen ovale was identified. - Tricuspid valve: There was trivial regurgitation. - Pulmonary arteries: Systolic pressure was within the normal   range. PA peak pressure: 25 mm Hg (S).      ASSESSMENT:    1. New onset atrial fibrillation ([place])   2. Chronic diastolic CHF (congestive heart failure) ([place])   3. DCM (dilated cardiomyopathy) ([place])   4. Benign essential HTN   5. Morbid obesity with BMI of 40.0-44.9, adult ([place])      PLAN:  In order of problems listed above: New-onset atrial fibrillation has been on Xarelto 20 mg daily since 04/26/16. Patient has not missed a dose in over a month. Rate is controlled with diltiazem 240 mg daily. Have scheduled cardioversion for next week.  Chronic diastolic CHF compensated  Dilated cardiomyopathy (presumed) with recent echo showing LV EF 45-50% with diffuse hypokinesis, moderate LVH and trivial TR. Follow-up Lexi scan showed a medium defect of moderate severity in the basal anteroseptal, basal inferior, mid inferior and apical inferior location. It was felt to be a low risk study. Dr. Radford Pax recommend follow-up cardiac CT to be done after  cardioversion.  Benign essential hypertension controlled  Morbid obesity weight loss and exercise program recommended after cardioversion.  Medication Adjustments/Labs and Tests Ordered: Current medicines are reviewed at length with the patient today.  Concerns regarding medicines are outlined above.  Medication changes, Labs and Tests ordered today are listed in the Patient Instructions below. Patient Instructions  Medication Instructions:   Your physician recommends that you continue on your current medications as directed. Please refer to the Current Medication list given to you today.   If you need a refill on your cardiac medications before your next appointment, please call your pharmacy.  Labwork: CBC BMET AND  PT INR   Testing/Procedures: SEE LETTER FOR CARIOVERSION [id number]    AFTER CARDIOVERSION .Marland KitchenMarland KitchenCardiac CT scanning, (CAT scanning), is a noninvasive, special x-ray that produces cross-sectional images of the body using x-rays and a computer. CT scans help physicians diagnose and treat medical conditions. For some CT exams, a contrast material is used to enhance visibility in the area of the body being studied. CT scans provide greater clarity and reveal more details than regular x-ray exams.   Follow-Up:  WITH DR TURNER A MONTH AFTER  CARDIOVERSION   Any Other Special Instructions Will Be Listed Below (If Applicable).                                                                                                                                                      Signed, Ermalinda Barrios, PA-C  06/03/2016 1:48 PM    [place], [place]  [address] Phone: [phone]; Fax: [phone]

## 2016-06-04 LAB — BASIC METABOLIC PANEL
BUN/Creatinine Ratio: 17 (ref 12–28)
BUN: 14 mg/dL (ref 8–27)
CALCIUM: 9.8 mg/dL (ref 8.7–10.3)
CO2: 27 mmol/L (ref 18–29)
CREATININE: 0.81 mg/dL (ref 0.57–1.00)
Chloride: 97 mmol/L (ref 96–106)
GFR calc Af Amer: 86 mL/min/{1.73_m2} (ref >59)
GFR calc non Af Amer: 75 mL/min/{1.73_m2} (ref >59)
GLUCOSE: 120 mg/dL — AB (ref 65–99)
Potassium: 3.9 mmol/L (ref 3.5–5.2)
SODIUM: 141 mmol/L (ref 134–144)

## 2016-06-04 LAB — PROTIME-INR
INR: 1.1 (ref 0.8–1.2)
Prothrombin Time: 11.2 s (ref 9.1–12.0)

## 2016-06-04 LAB — CBC
HEMOGLOBIN: 14 g/dL (ref 11.1–15.9)
Hematocrit: 43.2 % (ref 34.0–46.6)
MCH: 27.7 pg (ref 26.6–33.0)
MCHC: 32.4 g/dL (ref 31.5–35.7)
MCV: 86 fL (ref 79–97)
PLATELETS: 312 10*3/uL (ref 150–379)
RBC: 5.05 x10E6/uL (ref 3.77–5.28)
RDW: 14.4 % (ref 12.3–15.4)
WBC: 11 10*3/uL — AB (ref 3.4–10.8)

## 2016-06-11 ENCOUNTER — Ambulatory Visit (HOSPITAL_COMMUNITY): Payer: Medicare Other | Admitting: Anesthesiology

## 2016-06-11 ENCOUNTER — Encounter (HOSPITAL_COMMUNITY)
Admission: RE | Disposition: A | Discharge status: Home / Self Care | Payer: Self-pay | Source: Ambulatory Visit | Attending: Cardiology

## 2016-06-11 ENCOUNTER — Encounter (HOSPITAL_COMMUNITY): Payer: Self-pay | Admitting: *Deleted

## 2016-06-11 ENCOUNTER — Other Ambulatory Visit: Payer: Self-pay

## 2016-06-11 ENCOUNTER — Ambulatory Visit (HOSPITAL_COMMUNITY)
Admission: RE | Admit: 2016-06-11 | Discharge: 2016-06-11 | Disposition: A | Discharge status: Home / Self Care | Payer: Medicare Other | Source: Ambulatory Visit | Attending: Cardiology | Admitting: Cardiology

## 2016-06-11 DIAGNOSIS — Z7901 Long term (current) use of anticoagulants: Secondary | ICD-10-CM | POA: Insufficient documentation

## 2016-06-11 DIAGNOSIS — E119 Type 2 diabetes mellitus without complications: Secondary | ICD-10-CM | POA: Insufficient documentation

## 2016-06-11 DIAGNOSIS — Z809 Family history of malignant neoplasm, unspecified: Secondary | ICD-10-CM | POA: Diagnosis not present

## 2016-06-11 DIAGNOSIS — Z833 Family history of diabetes mellitus: Secondary | ICD-10-CM | POA: Insufficient documentation

## 2016-06-11 DIAGNOSIS — E559 Vitamin D deficiency, unspecified: Secondary | ICD-10-CM | POA: Diagnosis not present

## 2016-06-11 DIAGNOSIS — I110 Hypertensive heart disease with heart failure: Secondary | ICD-10-CM | POA: Insufficient documentation

## 2016-06-11 DIAGNOSIS — J45909 Unspecified asthma, uncomplicated: Secondary | ICD-10-CM | POA: Diagnosis not present

## 2016-06-11 DIAGNOSIS — I4891 Unspecified atrial fibrillation: Secondary | ICD-10-CM | POA: Diagnosis not present

## 2016-06-11 DIAGNOSIS — Z79899 Other long term (current) drug therapy: Secondary | ICD-10-CM | POA: Insufficient documentation

## 2016-06-11 DIAGNOSIS — G4733 Obstructive sleep apnea (adult) (pediatric): Secondary | ICD-10-CM | POA: Insufficient documentation

## 2016-06-11 DIAGNOSIS — F329 Major depressive disorder, single episode, unspecified: Secondary | ICD-10-CM | POA: Diagnosis not present

## 2016-06-11 DIAGNOSIS — Z7984 Long term (current) use of oral hypoglycemic drugs: Secondary | ICD-10-CM | POA: Insufficient documentation

## 2016-06-11 DIAGNOSIS — K219 Gastro-esophageal reflux disease without esophagitis: Secondary | ICD-10-CM | POA: Insufficient documentation

## 2016-06-11 DIAGNOSIS — Z8249 Family history of ischemic heart disease and other diseases of the circulatory system: Secondary | ICD-10-CM | POA: Insufficient documentation

## 2016-06-11 DIAGNOSIS — I5032 Chronic diastolic (congestive) heart failure: Secondary | ICD-10-CM | POA: Diagnosis not present

## 2016-06-11 DIAGNOSIS — I420 Dilated cardiomyopathy: Secondary | ICD-10-CM | POA: Diagnosis not present

## 2016-06-11 DIAGNOSIS — I10 Essential (primary) hypertension: Secondary | ICD-10-CM | POA: Diagnosis not present

## 2016-06-11 DIAGNOSIS — Z6841 Body Mass Index (BMI) 40.0 and over, adult: Secondary | ICD-10-CM | POA: Diagnosis not present

## 2016-06-11 HISTORY — PX: CARDIOVERSION: SHX1299

## 2016-06-11 LAB — GLUCOSE, CAPILLARY: Glucose-Capillary: 187 mg/dL — ABNORMAL HIGH (ref 65–99)

## 2016-06-11 SURGERY — CARDIOVERSION
Anesthesia: General

## 2016-06-11 MED ORDER — LIDOCAINE HCL (CARDIAC) 20 MG/ML IV SOLN
INTRAVENOUS | Status: DC | PRN
  Administered 2016-06-11: 40 mg via INTRAVENOUS

## 2016-06-11 MED ORDER — SODIUM CHLORIDE 0.9 % IV SOLN
INTRAVENOUS | Status: DC
  Administered 2016-06-11: 09:00:00 via INTRAVENOUS
  Administered 2016-06-11: 500 mL via INTRAVENOUS

## 2016-06-11 MED ORDER — PROPOFOL 10 MG/ML IV BOLUS
INTRAVENOUS | Status: DC | PRN
Start: 2016-06-11 — End: 2016-06-11
  Administered 2016-06-11: 30 mg via INTRAVENOUS
  Administered 2016-06-11: 60 mg via INTRAVENOUS

## 2016-06-11 NOTE — Discharge Instructions (Signed)
Electrical Cardioversion, Care After °This sheet gives you information about how to care for yourself after your procedure. Your health care provider may also give you more specific instructions. If you have problems or questions, contact your health care provider. °What can I expect after the procedure? °After the procedure, it is common to have: °· Some redness on the skin where the shocks were given. °Follow these instructions at home: °· Do not drive for 24 hours if you were given a medicine to help you relax (sedative). °· Take over-the-counter and prescription medicines only as told by your health care provider. °· Ask your health care provider how to check your pulse. Check it often. °· Rest for 48 hours after the procedure or as told by your health care provider. °· Avoid or limit your caffeine use as told by your health care provider. °Contact a health care provider if: °· You feel like your heart is beating too quickly or your pulse is not regular. °· You have a serious muscle cramp that does not go away. °Get help right away if: °· You have discomfort in your chest. °· You are dizzy or you feel faint. °· You have trouble breathing or you are short of breath. °· Your speech is slurred. °· You have trouble moving an arm or leg on one side of your body. °· Your fingers or toes turn cold or blue. °This information is not intended to replace advice given to you by your health care provider. Make sure you discuss any questions you have with your health care provider. °Document Released: 11/15/2012 Document Revised: 08/29/2015 Document Reviewed: 08/01/2015 °Elsevier Interactive Patient Education © 2017 [employer] ° °

## 2016-06-11 NOTE — CV Procedure (Signed)
   Cardioversion Note  GEETIKA LABORDE [id number] 1945-05-31  Procedure: DC Cardioversion Indications: atrial fibrillation  Procedure Details Consent: Obtained Time Out: Verified patient identification, verified procedure, site/side was marked, verified correct patient position, special equipment/implants available, Radiology Safety Procedures followed,  medications/allergies/relevent history reviewed, required imaging and test results available.  Performed  The patient has been on adequate anticoagulation.  The patient received IV lidocain 40 mg and iv propofol 90 mg administered by anesthesia staff for sedation.  Synchronous cardioversion was performed at 200 joules.  The cardioversion was successful.   Complications: No apparent complications Patient did tolerate procedure well.   [name], MD, [facility] 06/11/2016, 9:53 AM

## 2016-06-11 NOTE — H&P (View-Only) (Signed)
Cardiology Office Note    Date:  06/03/2016   ID:  Karen Rasmussen, DOB 1946-01-11, MRN [id number]  PCP:  Tawanna Solo, MD  Cardiologist:   Chief Complaint  Patient presents with  . Follow-up    Afib/Chronic diastolic CHF    History of Present Illness:  Karen Rasmussen is a 71 y.o. female with a history of morbid obesity, OSA and chronic diastolic CHF, Hypertension, new onset atrial fibrillation with controlled ventricular rate at office visit 04/26/16. CHADSVASC=5. She was started on Xarelto 20 mg daily and Cardizem was increased to 240 mg daily. Plan was to get a 2-D echo and cardioversion in 4 weeks. 2-D echo showed mild reduced LV function EF 45-50% with moderate LVH and trivial TR. Lexi scan Myoview then was ordered. This showed a medium defect of moderate severity present in the basal anterior septal, basal inferior, mid inferior and apical inferior location. It was felt to be a low risk study. Because she has mild LV dysfunction on echo Dr. Radford Pax recommended coronary CTA with morphology and FFR and calcium score after cardioversion .  Patient comes in today for follow-up. She remains in atrial fibrillation. She's had a couple episodes when walking where she becomes short of breath, her face turns red and she is drenched in sweat. She denies any rapid heart rate since diltiazem was increased. She hasn't missed any of her doses of Xarelto in the past month. 2-D echo and Lexi scan Myoview reviewed in detail with her.     Past Medical History:  Diagnosis Date  . Abdominal pain, left lower quadrant   . Asthmatic bronchitis   . Benign essential HTN 04/26/2016  . Benign hypertensive heart disease without heart failure   . Bursitis of hip   . Chronic diastolic CHF (congestive heart failure) ([place])   . DCM (dilated cardiomyopathy) ([place])    EF 45-50%  . Depression   . Diabetes mellitus without complication ([place])   . GERD (gastroesophageal reflux disease)   . Hypersomnia   . Kidney  stones   . Morbid obesity with BMI of 50.0-59.9, adult ([place])   . New onset atrial fibrillation ([place]) 04/26/2016  . Obesity   . OSA (obstructive sleep apnea)   . Vitamin D deficiency disease     No past surgical history on file.  Current Medications: Outpatient Medications Prior to Visit  Medication Sig Dispense Refill  . alendronate (FOSAMAX) 70 MG tablet Take 70 mg by mouth once a week. Take with a full glass of water on an empty stomach.    . Cholecalciferol (VITAMIN D PO) Take 1 tablet by mouth daily.     . Glucosamine 500 MG CAPS Take 1 capsule by mouth daily.    . hydrochlorothiazide (HYDRODIURIL) 25 MG tablet Take 1 tablet (25 mg total) by mouth daily. 90 tablet 0  . metFORMIN (GLUCOPHAGE) 500 MG tablet Take 500 mg by mouth daily.    . simvastatin (ZOCOR) 10 MG tablet Take 10 mg by mouth daily.    Marland Kitchen diltiazem (TIAZAC) 240 MG 24 hr capsule Take 1 capsule (240 mg total) by mouth daily. 30 capsule 11  . potassium chloride SA (K-DUR,KLOR-CON) 20 MEQ tablet Take 1 tablet (20 mEq total) by mouth daily. 90 tablet 3  . rivaroxaban (XARELTO) 20 MG TABS tablet Take 1 tablet (20 mg total) by mouth daily with supper. 30 tablet 11   No facility-administered medications prior to visit.      Allergies:   Patient  has no known allergies.   Social History   Social History  . Marital status: Divorced    Spouse name: N/A  . Number of children: N/A  . Years of education: N/A   Social History Main Topics  . Smoking status: Never Smoker  . Smokeless tobacco: Never Used  . Alcohol use None     Comment: occasional  . Drug use: No  . Sexual activity: Not Asked   Other Topics Concern  . None   Social History Narrative  . None     Family History:  The patient's family history includes CAD in her mother; Cancer in her father; Diabetes in her brother and father; Heart failure in her father; Hypertension in her brother.   ROS:   Please see the history of present illness.    Review of  Systems  Constitution: Negative.  HENT: Negative.   Eyes: Negative.   Cardiovascular: Positive for dyspnea on exertion, irregular heartbeat and palpitations.  Respiratory: Negative.   Hematologic/Lymphatic: Negative.   Musculoskeletal: Negative.  Negative for joint pain.  Gastrointestinal: Negative.   Genitourinary: Negative.   Neurological: Negative.    All other systems reviewed and are negative.   PHYSICAL EXAM:   VS:  BP 126/80   Pulse 78   Ht 5' 2.5" (1.588 m)   Wt 288 lb 6.4 oz (130.8 kg)   BMI 51.91 kg/m   Physical Exam  GEN: Obese, in no acute distress  Neck: no JVD, carotid bruits, or masses Cardiac: Irregular irregular, distant heart sounds no murmurs, rubs, or gallops  Respiratory:  clear to auscultation bilaterally, normal work of breathing GI: soft, nontender, nondistended, + BS Ext: without cyanosis, clubbing, or edema, Good distal pulses bilaterally Neuro:  Alert and Oriented x 3 Psych: euthymic mood, full affect  Wt Readings from Last 3 Encounters:  06/03/16 288 lb 6.4 oz (130.8 kg)  05/31/16 291 lb (132 kg)  04/26/16 291 lb 1.9 oz (132.1 kg)      Studies/Labs Reviewed:   EKG:  EKG is ordered today.  The ekg ordered today demonstrates Atrial fibrillation at 78 bpm with left anterior fascicular block and poor R-wave progression anteriorly  Recent Labs: 05/05/2016: NT-Pro BNP 314; Platelets 288; TSH 3.200 05/26/2016: BUN 16; Creatinine, Ser 0.69; Potassium 4.1; Sodium 142   Lipid Panel No results found for: CHOL, TRIG, HDL, CHOLHDL, VLDL, LDLCALC, LDLDIRECT  Additional studies/ records that were reviewed today include:  Lexi scan Myoview 06/01/16 Study Highlights     There was no ST segment deviation noted during stress.  Defect 1: There is a medium defect of moderate severity present in the basal anteroseptal, basal inferior, mid inferior and apical inferior location.  This is a low risk study.   Low risk stress nuclear study with fixed defects  in the basal septum and inferior walls (attenuation vs infarct); no ischemia; study not gated due to atrial fibrillation; suggest echo to assess LV function.     2-D echo 05/26/16 Study Conclusions   - Left ventricle: The cavity size was normal. There was moderate   concentric hypertrophy. Systolic function was mildly reduced. The   estimated ejection fraction was in the range of 45% to 50%.   Diffuse hypokinesis. - Aortic valve: Transvalvular velocity was within the normal range.   There was no stenosis. There was no regurgitation. - Mitral valve: Transvalvular velocity was within the normal range.   There was no evidence for stenosis. There was no regurgitation. - Right ventricle: The  cavity size was normal. Wall thickness was   normal. Systolic function was normal. - Atrial septum: No defect or patent foramen ovale was identified. - Tricuspid valve: There was trivial regurgitation. - Pulmonary arteries: Systolic pressure was within the normal   range. PA peak pressure: 25 mm Hg (S).      ASSESSMENT:    1. New onset atrial fibrillation ([place])   2. Chronic diastolic CHF (congestive heart failure) ([place])   3. DCM (dilated cardiomyopathy) ([place])   4. Benign essential HTN   5. Morbid obesity with BMI of 40.0-44.9, adult ([place])      PLAN:  In order of problems listed above: New-onset atrial fibrillation has been on Xarelto 20 mg daily since 04/26/16. Patient has not missed a dose in over a month. Rate is controlled with diltiazem 240 mg daily. Have scheduled cardioversion for next week.  Chronic diastolic CHF compensated  Dilated cardiomyopathy (presumed) with recent echo showing LV EF 45-50% with diffuse hypokinesis, moderate LVH and trivial TR. Follow-up Lexi scan showed a medium defect of moderate severity in the basal anteroseptal, basal inferior, mid inferior and apical inferior location. It was felt to be a low risk study. Dr. Radford Pax recommend follow-up cardiac CT to be done after  cardioversion.  Benign essential hypertension controlled  Morbid obesity weight loss and exercise program recommended after cardioversion.  Medication Adjustments/Labs and Tests Ordered: Current medicines are reviewed at length with the patient today.  Concerns regarding medicines are outlined above.  Medication changes, Labs and Tests ordered today are listed in the Patient Instructions below. Patient Instructions  Medication Instructions:   Your physician recommends that you continue on your current medications as directed. Please refer to the Current Medication list given to you today.   If you need a refill on your cardiac medications before your next appointment, please call your pharmacy.  Labwork: CBC BMET AND  PT INR   Testing/Procedures: SEE LETTER FOR CARIOVERSION 8*6*[id number]    AFTER CARDIOVERSION .Marland KitchenMarland KitchenCardiac CT scanning, (CAT scanning), is a noninvasive, special x-ray that produces cross-sectional images of the body using x-rays and a computer. CT scans help physicians diagnose and treat medical conditions. For some CT exams, a contrast material is used to enhance visibility in the area of the body being studied. CT scans provide greater clarity and reveal more details than regular x-ray exams.   Follow-Up:  WITH DR TURNER A MONTH AFTER  CARDIOVERSION   Any Other Special Instructions Will Be Listed Below (If Applicable).                                                                                                                                                      Signed, Ermalinda Barrios, PA-C  06/03/2016 1:48 PM    [place], [place]  [address] Phone: [phone]; Fax: [phone]

## 2016-06-11 NOTE — Anesthesia Preprocedure Evaluation (Addendum)
Anesthesia Evaluation  Patient identified by MRN, date of birth, ID band Patient awake    Reviewed: Allergy & Precautions, H&P , NPO status   Airway Mallampati: IV  TM Distance: >3 FB Neck ROM: full    Dental   Pulmonary asthma , sleep apnea ,    Pulmonary exam normal        Cardiovascular hypertension, +CHF   Rhythm:irregular     Neuro/Psych PSYCHIATRIC DISORDERS Depression    GI/Hepatic   Endo/Other  diabetes, Well Controlled, Type 2, Oral Hypoglycemic Agents  Renal/GU Renal disease     Musculoskeletal   Abdominal   Peds  Hematology   Anesthesia Other Findings - Left ventricle: The cavity size was normal. There was moderate   concentric hypertrophy. Systolic function was mildly reduced. The   estimated ejection fraction was in the range of 45% to 50%.   Diffuse hypokinesis. - Aortic valve: Transvalvular velocity was within the normal range.   There was no stenosis. There was no regurgitation. - Mitral valve: Transvalvular velocity was within the normal range.   There was no evidence for stenosis. There was no regurgitation. - Right ventricle: The cavity size was normal. Wall thickness was   normal. Systolic function was normal. - Atrial septum: No defect or patent foramen ovale was identified. - Tricuspid valve: There was trivial regurgitation. - Pulmonary arteries: Systolic pressure was within the normal   range. PA peak pressure: 25 mm Hg (S).   Reproductive/Obstetrics                         Anesthesia Physical Anesthesia Plan  ASA: III  Anesthesia Plan: General   Post-op Pain Management:    Induction: Intravenous  Airway Management Planned:   Additional Equipment:   Intra-op Plan:   Post-operative Plan:   Informed Consent: I have reviewed the patients History and Physical, chart, labs and discussed the procedure including the risks, benefits and alternatives for the  proposed anesthesia with the patient or authorized representative who has indicated his/her understanding and acceptance.   Dental Advisory Given  Plan Discussed with: CRNA  Anesthesia Plan Comments:        Anesthesia Quick Evaluation

## 2016-06-11 NOTE — Interval H&P Note (Signed)
History and Physical Interval Note:  06/11/2016 8:38 AM  Karen Rasmussen  has presented today for surgery, with the diagnosis of AFIB  The various methods of treatment have been discussed with the patient and family. After consideration of risks, benefits and other options for treatment, the patient has consented to  Procedure(s): CARDIOVERSION (N/A) as a surgical intervention .  The patient's history has been reviewed, patient examined, no change in status, stable for surgery.  I have reviewed the patient's chart and labs.  Questions were answered to the patient's satisfaction.     [name]

## 2016-06-11 NOTE — Anesthesia Procedure Notes (Signed)
Date/Time: 06/11/2016 9:35 AM Performed by: Neldon Newport Pre-anesthesia Checklist: Timeout performed, Patient being monitored, Suction available, Emergency Drugs available and Patient identified Patient Re-evaluated:Patient Re-evaluated prior to inductionOxygen Delivery Method: Ambu bag Preoxygenation: Pre-oxygenation with 100% oxygen Intubation Type: IV induction Ventilation: Oral airway inserted - appropriate to patient size and Mask ventilation without difficulty

## 2016-06-11 NOTE — Anesthesia Postprocedure Evaluation (Addendum)
Anesthesia Post Note  Patient: Karen Rasmussen  Procedure(s) Performed: Procedure(s) (LRB): CARDIOVERSION (N/A)  Patient location during evaluation: PACU Anesthesia Type: General Level of consciousness: awake and alert Pain management: pain level controlled Vital Signs Assessment: post-procedure vital signs reviewed and stable Respiratory status: spontaneous breathing, nonlabored ventilation, respiratory function stable and patient connected to nasal cannula oxygen Cardiovascular status: blood pressure returned to baseline and stable Postop Assessment: no signs of nausea or vomiting Anesthetic complications: no       Last Vitals:  Vitals:   06/11/16 0840 06/11/16 0958  BP: 126/80 (!) 142/74  Pulse: 69 72  Resp: 17 (!) 21  Temp: 36.6 C 36.5 C    Last Pain:  Vitals:   06/11/16 0958  TempSrc: Oral                 [name]

## 2016-06-11 NOTE — Transfer of Care (Signed)
Immediate Anesthesia Transfer of Care Note  Patient: Karen Rasmussen  Procedure(s) Performed: Procedure(s): CARDIOVERSION (N/A)  Patient Location: Endoscopy Unit  Anesthesia Type:General  Level of Consciousness: awake, alert  and oriented  Airway & Oxygen Therapy: Patient Spontanous Breathing and Patient connected to nasal cannula oxygen  Post-op Assessment: Report given to RN, Post -op Vital signs reviewed and stable and Patient moving all extremities X 4  Post vital signs: Reviewed and stable  Last Vitals:  Vitals:   06/11/16 0840  BP: 126/80  Pulse: 69  Resp: 17  Temp: 36.6 C    Last Pain:  Vitals:   06/11/16 0840  TempSrc: Oral         Complications: No apparent anesthesia complications

## 2016-06-14 ENCOUNTER — Encounter (HOSPITAL_COMMUNITY): Payer: Self-pay | Admitting: Cardiology

## 2016-06-28 ENCOUNTER — Telehealth: Payer: Self-pay | Admitting: Cardiology

## 2016-06-28 NOTE — Telephone Encounter (Signed)
Dr. Meda Coffee,  06-03-16 Dr. Radford Pax  ordereed a Cardiac Ct for Karen Rasmussen is a 71 y.o. female with a history of morbid obesity, OSA and chronic diastolic CHF, Hypertension, new onset atrial fibrillation with controlled ventricular rate at office visit 04/26/16.  CHADSVASC=5. She was started on Xarelto 20 mg daily and Cardizem was increased to 240 mg daily. Plan was to get a 2-D echo and cardioversion in 4 weeks. 2-D echo showed mild reduced LV function EF 45-50% with moderate LVH and trivial TR. Lexi scan Myoview then was ordered. This showed a medium defect of moderate severity present in the basal anterior septal, basal inferior, mid inferior and apical inferior location. It was felt to be a low risk study. Because she has mild LV dysfunction on echo Dr. Radford Pax recommended coronary CTA with morphology and FFR and calcium score after cardioversion .   Please Review and advise.

## 2016-06-28 NOTE — Telephone Encounter (Signed)
Thank you :)

## 2016-06-28 NOTE — Telephone Encounter (Signed)
I would strongly recommend to wait for a new scanner, we can probably wait since her stress test was read as a low risk. I will place her name down and let you know once we have the scanner functioning.  Karen Rasmussen

## 2016-07-09 ENCOUNTER — Encounter: Payer: Self-pay | Admitting: Cardiology

## 2016-07-09 ENCOUNTER — Ambulatory Visit (INDEPENDENT_AMBULATORY_CARE_PROVIDER_SITE_OTHER): Payer: Medicare Other | Admitting: Cardiology

## 2016-07-09 VITALS — BP 122/60 | HR 62 | Ht 62.5 in | Wt 277.8 lb

## 2016-07-09 DIAGNOSIS — Z6841 Body Mass Index (BMI) 40.0 and over, adult: Secondary | ICD-10-CM | POA: Diagnosis not present

## 2016-07-09 DIAGNOSIS — I420 Dilated cardiomyopathy: Secondary | ICD-10-CM

## 2016-07-09 DIAGNOSIS — I481 Persistent atrial fibrillation: Secondary | ICD-10-CM

## 2016-07-09 DIAGNOSIS — G4733 Obstructive sleep apnea (adult) (pediatric): Secondary | ICD-10-CM

## 2016-07-09 DIAGNOSIS — I5032 Chronic diastolic (congestive) heart failure: Secondary | ICD-10-CM | POA: Diagnosis not present

## 2016-07-09 DIAGNOSIS — I10 Essential (primary) hypertension: Secondary | ICD-10-CM | POA: Diagnosis not present

## 2016-07-09 DIAGNOSIS — I4819 Other persistent atrial fibrillation: Secondary | ICD-10-CM

## 2016-07-09 MED ORDER — LOSARTAN POTASSIUM 25 MG PO TABS: 25.0000 mg | ORAL_TABLET | Freq: Every day | ORAL | 11 refills | Status: DC

## 2016-07-09 MED ORDER — CARVEDILOL 3.125 MG PO TABS: 3.1250 mg | ORAL_TABLET | Freq: Two times a day (BID) | ORAL | 11 refills | Status: DC

## 2016-07-09 NOTE — Progress Notes (Signed)
Cardiology Office Note    Date:  07/09/2016   ID:  Karen Rasmussen, DOB April 12, 1945, MRN [id number]  PCP:  Kathyrn Lass, MD  Cardiologist:  Fransico Him, MD   Chief Complaint  Patient presents with  . Atrial Fibrillation  . Hypertension  . Sleep Apnea  . Congestive Heart Failure    History of Present Illness:  Karen Rasmussen is a 71 y.o. female with a history of morbid obesity, OSA and chronic diastolic CHF.  She recently was seen by Karen Husk, PA and was noted to be in new onset atrial fibrillation.  Her CHADS2VASC score was 5 so she was started on Xarelto 20mg  daily and Cardizem was increased to 240mg  daily.  2D echo showed mild LV dysfunction with EF 45-50% with moderate LVH.  Lexiscan myoview showed a medium defect of moderate severity in the basal anterior septal, basal inferior, mid inferior and apical inferior walls but felt to be low risk.  Her underwent DCCV to NSR on 06/11/2016.  She apparently had a bad reaction from the cardioversion pad and ended up taking a Medrol dose pack because Benadryl did not help.    She is now here for followupl.She has lost 14lbs since March.  She felt good after the DCCV but recently has started feeling more fatigued and had not felt like doing yard work.  She denies any chest pain or pressure, PND, orthopnea,  dizziness or palpitations. She has chronic DOE which is unchanged.  She has some mild LE edema.  She tolerates her BiPAP well and is on auto pressure. She uses the nasal pillow mask with chin strap which she tolerates well and feels the pressure is adequate. She continues to feel rested in the am but does take an afternoon nap occasionally. She denies any significant mouth dryness or nasal congestion.   Past Medical History:  Diagnosis Date  . Abdominal pain, left lower quadrant   . Asthmatic bronchitis   . Benign essential HTN 04/26/2016  . Benign hypertensive heart disease without heart failure   . Bursitis of hip   . Chronic  diastolic CHF (congestive heart failure) ([place])   . DCM (dilated cardiomyopathy) ([place])    EF 45-50%  . Depression   . Diabetes mellitus without complication ([place])   . GERD (gastroesophageal reflux disease)   . Hypersomnia   . Kidney stones   . Morbid obesity with BMI of 50.0-59.9, adult ([place])   . Obesity   . OSA (obstructive sleep apnea)   . Persistent atrial fibrillation ([place]) 04/26/2016  . Vitamin D deficiency disease     Past Surgical History:  Procedure Laterality Date  . CARDIOVERSION N/A 06/11/2016   Procedure: CARDIOVERSION;  Surgeon: Karen Spark, MD;  Location: [facility] ENDOSCOPY;  Service: Cardiovascular;  Laterality: N/A;    Current Medications: Current Meds  Medication Sig  . alendronate (FOSAMAX) 70 MG tablet Take 70 mg by mouth once a week. Take with a full glass of water on an empty stomach.  . Biotin w/ Vitamins C & E (HAIR SKIN & NAILS GUMMIES PO) Take by mouth. 2 gummies by mouth daily  . Cholecalciferol (VITAMIN D PO) Take 1 tablet by mouth daily.   Marland Kitchen diltiazem (TIAZAC) 240 MG 24 hr capsule Take 1 capsule (240 mg total) by mouth daily.  . Glucosamine 500 MG CAPS Take 1 capsule by mouth daily.  . hydrochlorothiazide (HYDRODIURIL) 25 MG tablet Take 1 tablet (25 mg total) by mouth daily.  Marland Kitchen HYDROcodone-acetaminophen (  NORCO/VICODIN) 5-325 MG tablet Take 1-2 tablets by mouth every 6 (six) hours as needed for moderate pain.  . metFORMIN (GLUCOPHAGE) 500 MG tablet Take 500 mg by mouth daily.  . Multiple Vitamins-Minerals (CENTRUM SILVER) CHEW 2 soft chews by mouth daily  . potassium chloride SA (K-DUR,KLOR-CON) 20 MEQ tablet Take 1 tablet (20 mEq total) by mouth daily.  . predniSONE (DELTASONE) 20 MG tablet Take 40 mg by mouth daily.  . rivaroxaban (XARELTO) 20 MG TABS tablet Take 1 tablet (20 mg total) by mouth daily with supper.  . simvastatin (ZOCOR) 10 MG tablet Take 10 mg by mouth daily.  . traZODone (DESYREL) 50 MG tablet Take 50 mg by mouth daily as needed for  sleep.     Allergies:   Patient has no known allergies.   Social History   Social History  . Marital status: Divorced    Spouse name: N/A  . Number of children: N/A  . Years of education: N/A   Social History Main Topics  . Smoking status: Never Smoker  . Smokeless tobacco: Never Used  . Alcohol use No     Comment: occasional  . Drug use: No  . Sexual activity: Not Asked   Other Topics Concern  . None   Social History Narrative  . None     Family History:  The patient's family history includes CAD in her mother; Cancer in her father; Diabetes in her brother and father; Heart failure in her father; Hypertension in her brother.   ROS:   Please see the history of present illness.    ROS All other systems reviewed and are negative.  No flowsheet data found.     PHYSICAL EXAM:   VS:  BP 122/60   Pulse 62   Ht 5' 2.5" (1.588 m)   Wt 277 lb 12.8 oz (126 kg)   SpO2 97%   BMI 50.00 kg/m    GEN: Well nourished, well developed, in no acute distress  HEENT: normal  Neck: no JVD, carotid bruits, or masses Cardiac: irregularly irregular; no murmurs, rubs, or gallops,no edema.  Intact distal pulses bilaterally.  Respiratory:  clear to auscultation bilaterally, normal work of breathing GI: soft, nontender, nondistended, + BS MS: no deformity or atrophy  Skin: warm and dry, no rash Neuro:  Alert and Oriented x 3, Strength and sensation are intact Psych: euthymic mood, full affect  Wt Readings from Last 3 Encounters:  07/09/16 277 lb 12.8 oz (126 kg)  06/03/16 288 lb 6.4 oz (130.8 kg)  05/31/16 291 lb (132 kg)      Studies/Labs Reviewed:   EKG:  EKG is ordered today.  The ekg ordered today demonstrates atrial fibrillation with CVR at 69bpm with nonspecific ST abnormality and IRBBB  Recent Labs: 05/05/2016: NT-Pro BNP 314; TSH 3.200 06/03/2016: BUN 14; Creatinine, Ser 0.81; Platelets 312; Potassium 3.9; Sodium 141   Lipid Panel No results found for: CHOL, TRIG,  HDL, CHOLHDL, VLDL, LDLCALC, LDLDIRECT  Additional studies/ records that were reviewed today include:  CPAP download    ASSESSMENT:    1. Persistent atrial fibrillation ([place])   2. Chronic diastolic CHF (congestive heart failure) ([place])   3. Benign essential HTN   4. DCM (dilated cardiomyopathy) ([place])   5. OSA (obstructive sleep apnea)   6. Morbid obesity with BMI of 40.0-44.9, adult ([place])      PLAN:  In order of problems listed above:  1.  Persistent atrial fibrillation s/p recent DCCV but now back  in atrial fibrillation with CVR at 69bpm.  She is asymptomatic.  I will send her to afib clinic for further recommendations.  She will continue on Xarelto 20mg  daily.  I will stop her Cardizem due to LV dysfunction and start Coreg 3.125mg  BID.    2.  Chronic combined systolic/diastolic CHF - she appears euvolemic today on exam and weight is stable.  I am stopping her Cardizem and startin Coreg 3.125mg  BID and Losartan 25mg  daily.  3.  HTN - Her BP is adequately controlled on exam today.  She will continue on HCTZ 25mg  daily and changing to Coreg 3.125mg  BID.  I will add Losartan 25mg  daily for BP control and for LV dysfunction.  4.  DME with EF 45-50% with abnormal nuclear stress test c/w scar considered low risk  Pending coronary CTA once we can maintain NSR.  She has no anginal symptoms.  5.   OSA - the patient is tolerating PAP therapy well without any problems. The PAP download was reviewed today and showed an AHI of 1.2/hr on auto BiPAP  with 83% compliance in using more than 4 hours nightly.  The patient has been using and benefiting from CPAP use and will continue to benefit from therapy.   6.   Morbid obesity - She has lost 14lbs and I congratulated her.  SHe will continue on her diet and exercise.    Medication Adjustments/Labs and Tests Ordered: Current medicines are reviewed at length with the patient today.  Concerns regarding medicines are outlined above.  Medication  changes, Labs and Tests ordered today are listed in the Patient Instructions below.  There are no Patient Instructions on file for this visit.   Signed, Fransico Him, MD  07/09/2016 4:34 PM    [facility] [address], [place], [place]  [address] Phone: [phone]; Fax: [phone]

## 2016-07-09 NOTE — Patient Instructions (Addendum)
Medication Instructions:  1) STOP CARDIZEM 2) START COREG 3.125 mg TWICE DAILY 3) START LOSARTAN 25 mg daily  Labwork: None  Testing/Procedures: None  Follow-Up: Your physician recommends that you schedule a follow-up appointment in: 2 WEEKS in the HTN CLINIC.  You have been referred to AFIB CLINIC.  Your physician wants you to follow-up in: 6 months with Dr. Radford Pax. You will receive a reminder letter in the mail two months in advance. If you don't receive a letter, please call our office to schedule the follow-up appointment.   Any Other Special Instructions Will Be Listed Below (If Applicable).     If you need a refill on your cardiac medications before your next appointment, please call your pharmacy.

## 2016-07-09 NOTE — Addendum Note (Signed)
Addendum  created 07/09/16 1154 by [name], MD   Sign clinical note

## 2016-07-12 ENCOUNTER — Telehealth (HOSPITAL_COMMUNITY): Payer: Self-pay | Admitting: *Deleted

## 2016-07-12 ENCOUNTER — Other Ambulatory Visit: Payer: Self-pay | Admitting: Cardiology

## 2016-07-12 NOTE — Telephone Encounter (Signed)
-----   Message from [name] sent at 07/09/2016  4:52 PM EDT ----- Regarding: pt needs appt in AFIB clinic  Please call pt.   Karen Rasmussen

## 2016-07-12 NOTE — Addendum Note (Signed)
Addended by: Mendel Ryder on: 07/12/2016 01:17 PM   Modules accepted: Orders

## 2016-07-12 NOTE — Telephone Encounter (Signed)
LMOM for pt to callback to sched follow up appt 

## 2016-07-13 ENCOUNTER — Encounter (HOSPITAL_COMMUNITY): Payer: Self-pay | Admitting: Nurse Practitioner

## 2016-07-13 ENCOUNTER — Ambulatory Visit (HOSPITAL_COMMUNITY)
Admission: RE | Admit: 2016-07-13 | Discharge: 2016-07-13 | Disposition: A | Discharge status: Home / Self Care | Payer: Medicare Other | Source: Ambulatory Visit | Attending: Nurse Practitioner | Admitting: Nurse Practitioner

## 2016-07-13 VITALS — BP 108/62 | HR 92 | Ht 64.0 in | Wt 279.0 lb

## 2016-07-13 DIAGNOSIS — Z833 Family history of diabetes mellitus: Secondary | ICD-10-CM | POA: Diagnosis not present

## 2016-07-13 DIAGNOSIS — I4819 Other persistent atrial fibrillation: Secondary | ICD-10-CM

## 2016-07-13 DIAGNOSIS — G4733 Obstructive sleep apnea (adult) (pediatric): Secondary | ICD-10-CM | POA: Insufficient documentation

## 2016-07-13 DIAGNOSIS — Z7983 Long term (current) use of bisphosphonates: Secondary | ICD-10-CM | POA: Diagnosis not present

## 2016-07-13 DIAGNOSIS — Z8249 Family history of ischemic heart disease and other diseases of the circulatory system: Secondary | ICD-10-CM | POA: Diagnosis not present

## 2016-07-13 DIAGNOSIS — I110 Hypertensive heart disease with heart failure: Secondary | ICD-10-CM | POA: Insufficient documentation

## 2016-07-13 DIAGNOSIS — E559 Vitamin D deficiency, unspecified: Secondary | ICD-10-CM | POA: Diagnosis not present

## 2016-07-13 DIAGNOSIS — I5032 Chronic diastolic (congestive) heart failure: Secondary | ICD-10-CM | POA: Diagnosis not present

## 2016-07-13 DIAGNOSIS — Z7901 Long term (current) use of anticoagulants: Secondary | ICD-10-CM | POA: Diagnosis not present

## 2016-07-13 DIAGNOSIS — I481 Persistent atrial fibrillation: Secondary | ICD-10-CM | POA: Insufficient documentation

## 2016-07-13 DIAGNOSIS — Z79899 Other long term (current) drug therapy: Secondary | ICD-10-CM | POA: Insufficient documentation

## 2016-07-13 DIAGNOSIS — I4891 Unspecified atrial fibrillation: Secondary | ICD-10-CM | POA: Diagnosis present

## 2016-07-13 DIAGNOSIS — E119 Type 2 diabetes mellitus without complications: Secondary | ICD-10-CM | POA: Diagnosis not present

## 2016-07-13 DIAGNOSIS — K219 Gastro-esophageal reflux disease without esophagitis: Secondary | ICD-10-CM | POA: Diagnosis not present

## 2016-07-13 DIAGNOSIS — Z7984 Long term (current) use of oral hypoglycemic drugs: Secondary | ICD-10-CM | POA: Diagnosis not present

## 2016-07-13 DIAGNOSIS — Z6841 Body Mass Index (BMI) 40.0 and over, adult: Secondary | ICD-10-CM | POA: Diagnosis not present

## 2016-07-13 LAB — CBC
HEMATOCRIT: 42.3 % (ref 36.0–46.0)
Hemoglobin: 13.5 g/dL (ref 12.0–15.0)
MCH: 28.5 pg (ref 26.0–34.0)
MCHC: 31.9 g/dL (ref 30.0–36.0)
MCV: 89.2 fL (ref 78.0–100.0)
Platelets: 244 10*3/uL (ref 150–400)
RBC: 4.74 MIL/uL (ref 3.87–5.11)
RDW: 14.4 % (ref 11.5–15.5)
WBC: 8.9 10*3/uL (ref 4.0–10.5)

## 2016-07-13 LAB — BASIC METABOLIC PANEL
ANION GAP: 7 (ref 5–15)
BUN: 16 mg/dL (ref 6–20)
CO2: 28 mmol/L (ref 22–32)
Calcium: 9 mg/dL (ref 8.9–10.3)
Chloride: 104 mmol/L (ref 101–111)
Creatinine, Ser: 0.77 mg/dL (ref 0.44–1.00)
GFR calc non Af Amer: >60 mL/min (ref >60)
Glucose, Bld: 116 mg/dL — ABNORMAL HIGH (ref 65–99)
Potassium: 3.9 mmol/L (ref 3.5–5.1)
Sodium: 139 mmol/L (ref 135–145)

## 2016-07-13 NOTE — Progress Notes (Signed)
Primary Care Physician: Kathyrn Lass, MD Referring Physician:   [name] is a 72 y.o. female with a h/o recent history of new onset afib in April  for which pt was placed on anticoagulation and after fully anticoagulated was scheduled for cardioversion. She developed a rash at the cardioversion pad sight and was prescribed prednisone taper by his MD. One day of starting the taper, she returned to afib. She is in the afib clinic to discuss options to restore SR. She did feel better in SR.  Today, she denies symptoms of palpitations, chest pain, shortness of breath, orthopnea, PND, lower extremity edema, dizziness, presyncope, syncope, or neurologic sequela. The patient is tolerating medications without difficulties and is otherwise without complaint today.   Past Medical History:  Diagnosis Date  . Abdominal pain, left lower quadrant   . Asthmatic bronchitis   . Benign essential HTN 04/26/2016  . Benign hypertensive heart disease without heart failure   . Bursitis of hip   . Chronic diastolic CHF (congestive heart failure) ([place])   . DCM (dilated cardiomyopathy) ([place])    EF 45-50%  . Depression   . Diabetes mellitus without complication ([place])   . GERD (gastroesophageal reflux disease)   . Hypersomnia   . Kidney stones   . Morbid obesity with BMI of 50.0-59.9, adult ([place])   . Obesity   . OSA (obstructive sleep apnea)   . Persistent atrial fibrillation ([place]) 04/26/2016  . Vitamin D deficiency disease    Past Surgical History:  Procedure Laterality Date  . CARDIOVERSION N/A 06/11/2016   Procedure: CARDIOVERSION;  Surgeon: Karen Spark, MD;  Location: [facility] ENDOSCOPY;  Service: Cardiovascular;  Laterality: N/A;    Current Outpatient Prescriptions  Medication Sig Dispense Refill  . alendronate (FOSAMAX) 70 MG tablet Take 70 mg by mouth once a week. Take with a full glass of water on an empty stomach.    . Biotin w/ Vitamins C & E (HAIR SKIN & NAILS GUMMIES PO) Take by mouth. 2  gummies by mouth daily    . carvedilol (COREG) 3.125 MG tablet Take 1 tablet (3.125 mg total) by mouth 2 (two) times daily with a meal. 60 tablet 11  . Cholecalciferol (VITAMIN D PO) Take 1 tablet by mouth daily.     . Glucosamine 500 MG CAPS Take 1 capsule by mouth daily.    . hydrochlorothiazide (HYDRODIURIL) 25 MG tablet Take 1 tablet (25 mg total) by mouth daily. 90 tablet 0  . HYDROcodone-acetaminophen (NORCO/VICODIN) 5-325 MG tablet Take 1-2 tablets by mouth every 6 (six) hours as needed for moderate pain.    Marland Kitchen losartan (COZAAR) 25 MG tablet Take 1 tablet (25 mg total) by mouth daily. 30 tablet 11  . metFORMIN (GLUCOPHAGE) 500 MG tablet Take 500 mg by mouth daily.    . Multiple Vitamins-Minerals (CENTRUM SILVER) CHEW 2 soft chews by mouth daily    . potassium chloride SA (K-DUR,KLOR-CON) 20 MEQ tablet Take 1 tablet (20 mEq total) by mouth daily. 90 tablet 3  . rivaroxaban (XARELTO) 20 MG TABS tablet Take 1 tablet (20 mg total) by mouth daily with supper. 90 tablet 1  . simvastatin (ZOCOR) 10 MG tablet Take 10 mg by mouth daily.    . traZODone (DESYREL) 50 MG tablet Take 50 mg by mouth daily as needed for sleep.      No current facility-administered medications for this encounter.     No Known Allergies  Social History   Social History  .  Marital status: Divorced    Spouse name: N/A  . Number of children: N/A  . Years of education: N/A   Occupational History  . Not on file.   Social History Main Topics  . Smoking status: Never Smoker  . Smokeless tobacco: Never Used  . Alcohol use No     Comment: occasional  . Drug use: No  . Sexual activity: Not on file   Other Topics Concern  . Not on file   Social History Narrative  . No narrative on file    Family History  Problem Relation Age of Onset  . Cancer Father   . Heart failure Father   . Diabetes Father   . CAD Mother   . Diabetes Brother   . Hypertension Brother     ROS- All systems are reviewed and negative  except as per the HPI above  Physical Exam: Vitals:   07/13/16 1333  BP: 108/62  Pulse: 92  Weight: 279 lb (126.6 kg)  Height: 5\' 4"  (1.626 m)   Wt Readings from Last 3 Encounters:  07/13/16 279 lb (126.6 kg)  07/09/16 277 lb 12.8 oz (126 kg)  06/03/16 288 lb 6.4 oz (130.8 kg)    Labs: Lab Results  Component Value Date   NA 141 06/03/2016   K 3.9 06/03/2016   CL 97 06/03/2016   CO2 27 06/03/2016   GLUCOSE 120 (H) 06/03/2016   BUN 14 06/03/2016   CREATININE 0.81 06/03/2016   CALCIUM 9.8 06/03/2016   Lab Results  Component Value Date   INR 1.1 06/03/2016   No results found for: CHOL, HDL, LDLCALC, TRIG   GEN- The patient is well appearing, alert and oriented x 3 today.   Head- normocephalic, atraumatic Eyes-  Sclera clear, conjunctiva pink Ears- hearing intact Oropharynx- clear Neck- supple, no JVP Lymph- no cervical lymphadenopathy Lungs- Clear to ausculation bilaterally, normal work of breathing Heart- irregular rate and rhythm, no murmurs, rubs or gallops, PMI not laterally displaced GI- soft, NT, ND, + BS Extremities- no clubbing, cyanosis, or edema MS- no significant deformity or atrophy Skin- no rash or lesion Psych- euthymic mood, full affect Neuro- strength and sensation are intact  EKG-afib at 92 bpm, LAFB qrs int 90 ms, qrs int 432 ms Epic records reviewed Echo-Study Conclusions  - Left ventricle: The cavity size was normal. There was moderate   concentric hypertrophy. Systolic function was mildly reduced. The   estimated ejection fraction was in the range of 45% to 50%.   Diffuse hypokinesis. - Aortic valve: Transvalvular velocity was within the normal range.   There was no stenosis. There was no regurgitation. - Mitral valve: Transvalvular velocity was within the normal range.   There was no evidence for stenosis. There was no regurgitation. - Right ventricle: The cavity size was normal. Wall thickness was   normal. Systolic function was  normal. - Atrial septum: No defect or patent foramen ovale was identified. - Tricuspid valve: There was trivial regurgitation. - Pulmonary arteries: Systolic pressure was within the normal   range. PA peak pressure: 25 mm Hg (S).    Assessment and Plan: 1. Persistent afib Successful cardioversion 5/4 but with ERAF  Discussed options to restore SR Because of LV dysfunction, multaq and flecainide will not be options Qtc looks acceptable in SR so I think sotalol or tikosyn would be options, hctz would have to be stopped On young side to opt for amiodarone unless no other options Pt would like to avoid  AAD's at this point and would like to try another cardioversion avoiding prednisone taper this time I think it is reasonable to try another cardioversion but if ERAF, she will be advised to try AAD No missed doses of xarelto  Can use benadryl by mouth after the procedure and cortisone cream at the site if reaction, avoid prednisone taper  Butch Penny C. Carroll, [facility] [address] [place], [place] [address] [phone]

## 2016-07-14 ENCOUNTER — Telehealth (HOSPITAL_COMMUNITY): Payer: Self-pay | Admitting: *Deleted

## 2016-07-14 ENCOUNTER — Other Ambulatory Visit (HOSPITAL_COMMUNITY): Payer: Self-pay | Admitting: *Deleted

## 2016-07-14 NOTE — Telephone Encounter (Signed)
Patient scheduled for DCCV on Friday 6/8 -- to arrive at 930am NPO after MN except medications with water (excluding metformin). Pt verbalized understanding of instructions.  One week follow up appt made.

## 2016-07-16 ENCOUNTER — Ambulatory Visit (HOSPITAL_COMMUNITY): Payer: Medicare Other | Admitting: Certified Registered Nurse Anesthetist

## 2016-07-16 ENCOUNTER — Encounter (HOSPITAL_COMMUNITY)
Admission: RE | Disposition: A | Discharge status: Home / Self Care | Payer: Self-pay | Source: Ambulatory Visit | Attending: Cardiovascular Disease

## 2016-07-16 ENCOUNTER — Ambulatory Visit (HOSPITAL_COMMUNITY)
Admission: RE | Admit: 2016-07-16 | Discharge: 2016-07-16 | Disposition: A | Discharge status: Home / Self Care | Payer: Medicare Other | Source: Ambulatory Visit | Attending: Cardiovascular Disease | Admitting: Cardiovascular Disease

## 2016-07-16 ENCOUNTER — Encounter (HOSPITAL_COMMUNITY): Payer: Self-pay | Admitting: *Deleted

## 2016-07-16 DIAGNOSIS — J45909 Unspecified asthma, uncomplicated: Secondary | ICD-10-CM | POA: Insufficient documentation

## 2016-07-16 DIAGNOSIS — Z7901 Long term (current) use of anticoagulants: Secondary | ICD-10-CM | POA: Diagnosis not present

## 2016-07-16 DIAGNOSIS — F329 Major depressive disorder, single episode, unspecified: Secondary | ICD-10-CM | POA: Diagnosis not present

## 2016-07-16 DIAGNOSIS — I4891 Unspecified atrial fibrillation: Secondary | ICD-10-CM | POA: Diagnosis not present

## 2016-07-16 DIAGNOSIS — Z6841 Body Mass Index (BMI) 40.0 and over, adult: Secondary | ICD-10-CM | POA: Diagnosis not present

## 2016-07-16 DIAGNOSIS — I481 Persistent atrial fibrillation: Secondary | ICD-10-CM | POA: Diagnosis not present

## 2016-07-16 DIAGNOSIS — Z7984 Long term (current) use of oral hypoglycemic drugs: Secondary | ICD-10-CM | POA: Insufficient documentation

## 2016-07-16 DIAGNOSIS — I5032 Chronic diastolic (congestive) heart failure: Secondary | ICD-10-CM | POA: Diagnosis not present

## 2016-07-16 DIAGNOSIS — I420 Dilated cardiomyopathy: Secondary | ICD-10-CM | POA: Insufficient documentation

## 2016-07-16 DIAGNOSIS — Z809 Family history of malignant neoplasm, unspecified: Secondary | ICD-10-CM | POA: Insufficient documentation

## 2016-07-16 DIAGNOSIS — I110 Hypertensive heart disease with heart failure: Secondary | ICD-10-CM | POA: Insufficient documentation

## 2016-07-16 DIAGNOSIS — E559 Vitamin D deficiency, unspecified: Secondary | ICD-10-CM | POA: Insufficient documentation

## 2016-07-16 DIAGNOSIS — E119 Type 2 diabetes mellitus without complications: Secondary | ICD-10-CM | POA: Insufficient documentation

## 2016-07-16 DIAGNOSIS — Z833 Family history of diabetes mellitus: Secondary | ICD-10-CM | POA: Diagnosis not present

## 2016-07-16 DIAGNOSIS — Z8249 Family history of ischemic heart disease and other diseases of the circulatory system: Secondary | ICD-10-CM | POA: Diagnosis not present

## 2016-07-16 DIAGNOSIS — Z79899 Other long term (current) drug therapy: Secondary | ICD-10-CM | POA: Diagnosis not present

## 2016-07-16 DIAGNOSIS — G4733 Obstructive sleep apnea (adult) (pediatric): Secondary | ICD-10-CM | POA: Insufficient documentation

## 2016-07-16 DIAGNOSIS — K219 Gastro-esophageal reflux disease without esophagitis: Secondary | ICD-10-CM | POA: Diagnosis not present

## 2016-07-16 DIAGNOSIS — I10 Essential (primary) hypertension: Secondary | ICD-10-CM | POA: Diagnosis not present

## 2016-07-16 HISTORY — PX: CARDIOVERSION: SHX1299

## 2016-07-16 LAB — GLUCOSE, CAPILLARY: Glucose-Capillary: 135 mg/dL — ABNORMAL HIGH (ref 65–99)

## 2016-07-16 SURGERY — CARDIOVERSION
Anesthesia: General

## 2016-07-16 MED ORDER — SODIUM CHLORIDE 0.9 % IV SOLN
INTRAVENOUS | Status: DC
  Administered 2016-07-16: 10:00:00 via INTRAVENOUS

## 2016-07-16 MED ORDER — LIDOCAINE 2% (20 MG/ML) 5 ML SYRINGE
INTRAMUSCULAR | Status: DC | PRN
  Administered 2016-07-16: 60 mg via INTRAVENOUS

## 2016-07-16 MED ORDER — PROPOFOL 10 MG/ML IV BOLUS
INTRAVENOUS | Status: DC | PRN
  Administered 2016-07-16: 50 mg via INTRAVENOUS
  Administered 2016-07-16: 20 mg via INTRAVENOUS

## 2016-07-16 NOTE — Anesthesia Postprocedure Evaluation (Signed)
Anesthesia Post Note  Patient: Karen Rasmussen  Procedure(s) Performed: Procedure(s) (LRB): CARDIOVERSION (N/A)     Patient location during evaluation: PACU Anesthesia Type: General Level of consciousness: awake and alert Pain management: pain level controlled Vital Signs Assessment: post-procedure vital signs reviewed and stable Respiratory status: spontaneous breathing, nonlabored ventilation, respiratory function stable and patient connected to nasal cannula oxygen Cardiovascular status: blood pressure returned to baseline and stable Postop Assessment: no signs of nausea or vomiting Anesthetic complications: no    Last Vitals:  Vitals:   07/16/16 1035 07/16/16 1045  BP: (!) 97/57 116/64  Pulse: 68 66  Resp: 16 19  Temp:      Last Pain:  Vitals:   07/16/16 1025  TempSrc: Oral                 Catalina Gravel

## 2016-07-16 NOTE — Anesthesia Preprocedure Evaluation (Signed)
Anesthesia Evaluation  Patient identified by MRN, date of birth, ID band Patient awake    Reviewed: Allergy & Precautions, H&P , NPO status   Airway Mallampati: IV  TM Distance: >3 FB Neck ROM: full    Dental   Pulmonary asthma , sleep apnea ,    Pulmonary exam normal        Cardiovascular hypertension, Pt. on medications and Pt. on home beta blockers +CHF   Rhythm:irregular     Neuro/Psych PSYCHIATRIC DISORDERS Depression    GI/Hepatic Neg liver ROS, GERD  ,  Endo/Other  diabetes, Well Controlled, Type 2, Oral Hypoglycemic Agents  Renal/GU Renal disease     Musculoskeletal   Abdominal   Peds  Hematology   Anesthesia Other Findings - Left ventricle: The cavity size was normal. There was moderate   concentric hypertrophy. Systolic function was mildly reduced. The   estimated ejection fraction was in the range of 45% to 50%.   Diffuse hypokinesis. - Aortic valve: Transvalvular velocity was within the normal range.   There was no stenosis. There was no regurgitation. - Mitral valve: Transvalvular velocity was within the normal range.   There was no evidence for stenosis. There was no regurgitation. - Right ventricle: The cavity size was normal. Wall thickness was   normal. Systolic function was normal. - Atrial septum: No defect or patent foramen ovale was identified. - Tricuspid valve: There was trivial regurgitation. - Pulmonary arteries: Systolic pressure was within the normal   range. PA peak pressure: 25 mm Hg (S).   Reproductive/Obstetrics                             Anesthesia Physical Anesthesia Plan  ASA: III  Anesthesia Plan: General   Post-op Pain Management:    Induction: Intravenous  PONV Risk Score and Plan: 3 and Propofol and Treatment may vary due to age  Airway Management Planned: Mask  Additional Equipment: None  Intra-op Plan:   Post-operative Plan:    Informed Consent: I have reviewed the patients History and Physical, chart, labs and discussed the procedure including the risks, benefits and alternatives for the proposed anesthesia with the patient or authorized representative who has indicated his/her understanding and acceptance.   Dental advisory given  Plan Discussed with: CRNA and Surgeon  Anesthesia Plan Comments:         Anesthesia Quick Evaluation

## 2016-07-16 NOTE — Anesthesia Procedure Notes (Signed)
Date/Time: 07/16/2016 10:14 AM Performed by: Everlean Cherry A Pre-anesthesia Checklist: Patient identified, Emergency Drugs available, Suction available and Patient being monitored Patient Re-evaluated:Patient Re-evaluated prior to inductionOxygen Delivery Method: Ambu bag Preoxygenation: Pre-oxygenation with 100% oxygen Intubation Type: IV induction

## 2016-07-16 NOTE — Interval H&P Note (Signed)
History and Physical Interval Note:  07/16/2016 10:10 AM  Karen Rasmussen  has presented today for surgery, with the diagnosis of AFIB  The various methods of treatment have been discussed with the patient and family. After consideration of risks, benefits and other options for treatment, the patient has consented to  Procedure(s): CARDIOVERSION (N/A) as a surgical intervention .  The patient's history has been reviewed, patient examined, no change in status, stable for surgery.  I have reviewed the patient's chart and labs.  Questions were answered to the patient's satisfaction.     Mertie Moores

## 2016-07-16 NOTE — H&P (View-Only) (Signed)
Primary Care Physician: Kathyrn Lass, MD Referring Physician:   MANDE Rasmussen is a 71 y.o. female with a h/o recent history of new onset afib in April  for which pt was placed on anticoagulation and after fully anticoagulated was scheduled for cardioversion. She developed a rash at the cardioversion pad sight and was prescribed prednisone taper by his MD. One day of starting the taper, she returned to afib. She is in the afib clinic to discuss options to restore SR. She did feel better in SR.  Today, she denies symptoms of palpitations, chest pain, shortness of breath, orthopnea, PND, lower extremity edema, dizziness, presyncope, syncope, or neurologic sequela. The patient is tolerating medications without difficulties and is otherwise without complaint today.   Past Medical History:  Diagnosis Date  . Abdominal pain, left lower quadrant   . Asthmatic bronchitis   . Benign essential HTN 04/26/2016  . Benign hypertensive heart disease without heart failure   . Bursitis of hip   . Chronic diastolic CHF (congestive heart failure) ([place])   . DCM (dilated cardiomyopathy) ([place])    EF 45-50%  . Depression   . Diabetes mellitus without complication ([place])   . GERD (gastroesophageal reflux disease)   . Hypersomnia   . Kidney stones   . Morbid obesity with BMI of 50.0-59.9, adult ([place])   . Obesity   . OSA (obstructive sleep apnea)   . Persistent atrial fibrillation ([place]) 04/26/2016  . Vitamin D deficiency disease    Past Surgical History:  Procedure Laterality Date  . CARDIOVERSION N/A 06/11/2016   Procedure: CARDIOVERSION;  Surgeon: Dorothy Spark, MD;  Location: [facility] ENDOSCOPY;  Service: Cardiovascular;  Laterality: N/A;    Current Outpatient Prescriptions  Medication Sig Dispense Refill  . alendronate (FOSAMAX) 70 MG tablet Take 70 mg by mouth once a week. Take with a full glass of water on an empty stomach.    . Biotin w/ Vitamins C & E (HAIR SKIN & NAILS GUMMIES PO) Take by mouth. 2  gummies by mouth daily    . carvedilol (COREG) 3.125 MG tablet Take 1 tablet (3.125 mg total) by mouth 2 (two) times daily with a meal. 60 tablet 11  . Cholecalciferol (VITAMIN D PO) Take 1 tablet by mouth daily.     . Glucosamine 500 MG CAPS Take 1 capsule by mouth daily.    . hydrochlorothiazide (HYDRODIURIL) 25 MG tablet Take 1 tablet (25 mg total) by mouth daily. 90 tablet 0  . HYDROcodone-acetaminophen (NORCO/VICODIN) 5-325 MG tablet Take 1-2 tablets by mouth every 6 (six) hours as needed for moderate pain.    Marland Kitchen losartan (COZAAR) 25 MG tablet Take 1 tablet (25 mg total) by mouth daily. 30 tablet 11  . metFORMIN (GLUCOPHAGE) 500 MG tablet Take 500 mg by mouth daily.    . Multiple Vitamins-Minerals (CENTRUM SILVER) CHEW 2 soft chews by mouth daily    . potassium chloride SA (K-DUR,KLOR-CON) 20 MEQ tablet Take 1 tablet (20 mEq total) by mouth daily. 90 tablet 3  . rivaroxaban (XARELTO) 20 MG TABS tablet Take 1 tablet (20 mg total) by mouth daily with supper. 90 tablet 1  . simvastatin (ZOCOR) 10 MG tablet Take 10 mg by mouth daily.    . traZODone (DESYREL) 50 MG tablet Take 50 mg by mouth daily as needed for sleep.      No current facility-administered medications for this encounter.     No Known Allergies  Social History   Social History  .  Marital status: Divorced    Spouse name: N/A  . Number of children: N/A  . Years of education: N/A   Occupational History  . Not on file.   Social History Main Topics  . Smoking status: Never Smoker  . Smokeless tobacco: Never Used  . Alcohol use No     Comment: occasional  . Drug use: No  . Sexual activity: Not on file   Other Topics Concern  . Not on file   Social History Narrative  . No narrative on file    Family History  Problem Relation Age of Onset  . Cancer Father   . Heart failure Father   . Diabetes Father   . CAD Mother   . Diabetes Brother   . Hypertension Brother     ROS- All systems are reviewed and negative  except as per the HPI above  Physical Exam: Vitals:   07/13/16 1333  BP: 108/62  Pulse: 92  Weight: 279 lb (126.6 kg)  Height: 5\' 4"  (1.626 m)   Wt Readings from Last 3 Encounters:  07/13/16 279 lb (126.6 kg)  07/09/16 277 lb 12.8 oz (126 kg)  06/03/16 288 lb 6.4 oz (130.8 kg)    Labs: Lab Results  Component Value Date   NA 141 06/03/2016   K 3.9 06/03/2016   CL 97 06/03/2016   CO2 27 06/03/2016   GLUCOSE 120 (H) 06/03/2016   BUN 14 06/03/2016   CREATININE 0.81 06/03/2016   CALCIUM 9.8 06/03/2016   Lab Results  Component Value Date   INR 1.1 06/03/2016   No results found for: CHOL, HDL, LDLCALC, TRIG   GEN- The patient is well appearing, alert and oriented x 3 today.   Head- normocephalic, atraumatic Eyes-  Sclera clear, conjunctiva pink Ears- hearing intact Oropharynx- clear Neck- supple, no JVP Lymph- no cervical lymphadenopathy Lungs- Clear to ausculation bilaterally, normal work of breathing Heart- irregular rate and rhythm, no murmurs, rubs or gallops, PMI not laterally displaced GI- soft, NT, ND, + BS Extremities- no clubbing, cyanosis, or edema MS- no significant deformity or atrophy Skin- no rash or lesion Psych- euthymic mood, full affect Neuro- strength and sensation are intact  EKG-afib at 92 bpm, LAFB qrs int 90 ms, qrs int 432 ms Epic records reviewed Echo-Study Conclusions  - Left ventricle: The cavity size was normal. There was moderate   concentric hypertrophy. Systolic function was mildly reduced. The   estimated ejection fraction was in the range of 45% to 50%.   Diffuse hypokinesis. - Aortic valve: Transvalvular velocity was within the normal range.   There was no stenosis. There was no regurgitation. - Mitral valve: Transvalvular velocity was within the normal range.   There was no evidence for stenosis. There was no regurgitation. - Right ventricle: The cavity size was normal. Wall thickness was   normal. Systolic function was  normal. - Atrial septum: No defect or patent foramen ovale was identified. - Tricuspid valve: There was trivial regurgitation. - Pulmonary arteries: Systolic pressure was within the normal   range. PA peak pressure: 25 mm Hg (S).    Assessment and Plan: 1. Persistent afib Successful cardioversion 5/4 but with ERAF  Discussed options to restore SR Because of LV dysfunction, multaq and flecainide will not be options Qtc looks acceptable in SR so I think sotalol or tikosyn would be options, hctz would have to be stopped On young side to opt for amiodarone unless no other options Pt would like to avoid  AAD's at this point and would like to try another cardioversion avoiding prednisone taper this time I think it is reasonable to try another cardioversion but if ERAF, she will be advised to try AAD No missed doses of xarelto  Can use benadryl by mouth after the procedure and cortisone cream at the site if reaction, avoid prednisone taper  Butch Penny C. Ulysses Alper, [facility] [address] [place], [place] [address] [phone]

## 2016-07-16 NOTE — Transfer of Care (Signed)
Immediate Anesthesia Transfer of Care Note  Patient: Karen Rasmussen  Procedure(s) Performed: Procedure(s): CARDIOVERSION (N/A)  Patient Location: Endoscopy Unit  Anesthesia Type:General  Level of Consciousness: awake, alert , oriented and patient cooperative  Airway & Oxygen Therapy: Patient Spontanous Breathing  Post-op Assessment: Report given to RN and Post -op Vital signs reviewed and stable  Post vital signs: Reviewed and stable  Last Vitals:  Vitals:   07/16/16 1016 07/16/16 1017  BP:    Pulse: 95 65  Resp: 17 (!) 25  Temp:      Last Pain:  Vitals:   07/16/16 0940  TempSrc: Oral         Complications: No apparent anesthesia complications

## 2016-07-16 NOTE — CV Procedure (Signed)
    Cardioversion Note  CHIVONNE RASCON [id number] 02/07/1946  Procedure: DC Cardioversion Indications: Atrial fib   Procedure Details Consent: Obtained Time Out: Verified patient identification, verified procedure, site/side was marked, verified correct patient position, special equipment/implants available, Radiology Safety Procedures followed,  medications/allergies/relevent history reviewed, required imaging and test results available.  Performed  The patient has been on adequate anticoagulation.  The patient received IV Lidocaine 60 mg IV followed by Propofol 50 mg IV  for sedation.  Synchronous cardioversion was performed at 200 joules with the old style paddles and conductive gel ( she had a rash with the adhesive on the newer defibrillation pads .  The cardioversion was successful     Complications: No apparent complications Patient did tolerate procedure well.   Thayer Headings, Brooke Bonito., MD, [facility] 07/16/2016, 10:43 AM

## 2016-07-16 NOTE — Discharge Instructions (Signed)
Electrical Cardioversion, Care After °This sheet gives you information about how to care for yourself after your procedure. Your health care provider may also give you more specific instructions. If you have problems or questions, contact your health care provider. °What can I expect after the procedure? °After the procedure, it is common to have: °· Some redness on the skin where the shocks were given. ° °Follow these instructions at home: °· Do not drive for 24 hours if you were given a medicine to help you relax (sedative). °· Take over-the-counter and prescription medicines only as told by your health care provider. °· Ask your health care provider how to check your pulse. Check it often. °· Rest for 48 hours after the procedure or as told by your health care provider. °· Avoid or limit your caffeine use as told by your health care provider. °Contact a health care provider if: °· You feel like your heart is beating too quickly or your pulse is not regular. °· You have a serious muscle cramp that does not go away. °Get help right away if: °· You have discomfort in your chest. °· You are dizzy or you feel faint. °· You have trouble breathing or you are short of breath. °· Your speech is slurred. °· You have trouble moving an arm or leg on one side of your body. °· Your fingers or toes turn cold or blue. °This information is not intended to replace advice given to you by your health care provider. Make sure you discuss any questions you have with your health care provider. °Document Released: 11/15/2012 Document Revised: 08/29/2015 Document Reviewed: 08/01/2015 °Elsevier Interactive Patient Education © 2018 Elsevier Inc. ° °

## 2016-07-19 ENCOUNTER — Encounter (HOSPITAL_COMMUNITY): Payer: Self-pay | Admitting: Cardiovascular Disease

## 2016-07-23 ENCOUNTER — Ambulatory Visit (HOSPITAL_COMMUNITY)
Admission: RE | Admit: 2016-07-23 | Discharge: 2016-07-23 | Disposition: A | Discharge status: Home / Self Care | Payer: Medicare Other | Source: Ambulatory Visit | Attending: Nurse Practitioner | Admitting: Nurse Practitioner

## 2016-07-23 ENCOUNTER — Ambulatory Visit: Payer: Medicare Other

## 2016-07-23 ENCOUNTER — Telehealth: Payer: Self-pay | Admitting: Cardiology

## 2016-07-23 VITALS — BP 118/62 | HR 72 | Ht 64.0 in | Wt 281.2 lb

## 2016-07-23 DIAGNOSIS — K219 Gastro-esophageal reflux disease without esophagitis: Secondary | ICD-10-CM | POA: Diagnosis not present

## 2016-07-23 DIAGNOSIS — I5032 Chronic diastolic (congestive) heart failure: Secondary | ICD-10-CM | POA: Insufficient documentation

## 2016-07-23 DIAGNOSIS — Z7901 Long term (current) use of anticoagulants: Secondary | ICD-10-CM | POA: Diagnosis not present

## 2016-07-23 DIAGNOSIS — Z6841 Body Mass Index (BMI) 40.0 and over, adult: Secondary | ICD-10-CM | POA: Insufficient documentation

## 2016-07-23 DIAGNOSIS — E559 Vitamin D deficiency, unspecified: Secondary | ICD-10-CM | POA: Insufficient documentation

## 2016-07-23 DIAGNOSIS — J45909 Unspecified asthma, uncomplicated: Secondary | ICD-10-CM | POA: Diagnosis not present

## 2016-07-23 DIAGNOSIS — I110 Hypertensive heart disease with heart failure: Secondary | ICD-10-CM | POA: Diagnosis not present

## 2016-07-23 DIAGNOSIS — E119 Type 2 diabetes mellitus without complications: Secondary | ICD-10-CM | POA: Diagnosis not present

## 2016-07-23 DIAGNOSIS — Z87442 Personal history of urinary calculi: Secondary | ICD-10-CM | POA: Diagnosis not present

## 2016-07-23 DIAGNOSIS — F329 Major depressive disorder, single episode, unspecified: Secondary | ICD-10-CM | POA: Diagnosis not present

## 2016-07-23 DIAGNOSIS — I4819 Other persistent atrial fibrillation: Secondary | ICD-10-CM

## 2016-07-23 DIAGNOSIS — I420 Dilated cardiomyopathy: Secondary | ICD-10-CM | POA: Diagnosis not present

## 2016-07-23 DIAGNOSIS — G4733 Obstructive sleep apnea (adult) (pediatric): Secondary | ICD-10-CM | POA: Diagnosis not present

## 2016-07-23 DIAGNOSIS — I444 Left anterior fascicular block: Secondary | ICD-10-CM | POA: Diagnosis not present

## 2016-07-23 DIAGNOSIS — Z7984 Long term (current) use of oral hypoglycemic drugs: Secondary | ICD-10-CM | POA: Insufficient documentation

## 2016-07-23 DIAGNOSIS — Z8249 Family history of ischemic heart disease and other diseases of the circulatory system: Secondary | ICD-10-CM | POA: Diagnosis not present

## 2016-07-23 DIAGNOSIS — I481 Persistent atrial fibrillation: Secondary | ICD-10-CM | POA: Diagnosis not present

## 2016-07-23 NOTE — Progress Notes (Signed)
Primary Care Physician: Kathyrn Lass, MD Referring Physician:   [name] is a 71 y.o. female with a h/o recent history of new onset afib in April  for which pt was placed on anticoagulation and after fully anticoagulated was scheduled for cardioversion. She developed a rash at the cardioversion pad sight and was prescribed prednisone taper by his MD. One day of starting the taper, she returned to afib. She is in the afib clinic to discuss options to restore SR. She did feel better in SR.  F/u successful cardioversion 6/15. She is feeling better. She was cardioverted with paddles, not the pads that she had an allergic reaction to. No issues with cardioversion this time. She is trying to be more active and lose weight.   Today, she denies symptoms of palpitations, chest pain, shortness of breath, orthopnea, PND, lower extremity edema, dizziness, presyncope, syncope, or neurologic sequela. The patient is tolerating medications without difficulties and is otherwise without complaint today.   Past Medical History:  Diagnosis Date  . Abdominal pain, left lower quadrant   . Asthmatic bronchitis   . Benign essential HTN 04/26/2016  . Benign hypertensive heart disease without heart failure   . Bursitis of hip   . Chronic diastolic CHF (congestive heart failure) ([place])   . DCM (dilated cardiomyopathy) ([place])    EF 45-50%  . Depression   . Diabetes mellitus without complication ([place])   . GERD (gastroesophageal reflux disease)   . Hypersomnia   . Kidney stones   . Morbid obesity with BMI of 50.0-59.9, adult ([place])   . Obesity   . OSA (obstructive sleep apnea)   . Persistent atrial fibrillation ([place]) 04/26/2016  . Vitamin D deficiency disease    Past Surgical History:  Procedure Laterality Date  . CARDIOVERSION N/A 06/11/2016   Procedure: CARDIOVERSION;  Surgeon: Dorothy Spark, MD;  Location[facility] ENDOSCOPY;  Service: Cardiovascular;  Laterality: N/A;  . CARDIOVERSION N/A 07/16/2016   Procedure: CARDIOVERSION;  Surgeon: Thayer Headings, MD;  Location: [facility] ENDOSCOPY;  Service: Cardiovascular;  Laterality: N/A;    Current Outpatient Prescriptions  Medication Sig Dispense Refill  . alendronate (FOSAMAX) 70 MG tablet Take 70 mg by mouth once a week. Take with a full glass of water on an empty stomach.    . Biotin w/ Vitamins C & E (HAIR SKIN & NAILS GUMMIES PO) Take by mouth. 2 gummies by mouth daily    . carvedilol (COREG) 3.125 MG tablet Take 1 tablet (3.125 mg total) by mouth 2 (two) times daily with a meal. 60 tablet 11  . Cholecalciferol (VITAMIN D PO) Take 1 tablet by mouth daily.     . Glucosamine 500 MG CAPS Take 1 capsule by mouth daily.    . hydrochlorothiazide (HYDRODIURIL) 25 MG tablet Take 1 tablet (25 mg total) by mouth daily. 90 tablet 0  . hydrochlorothiazide (HYDRODIURIL) 25 MG tablet TAKE 1 TABLET DAILY 90 tablet 1  . HYDROcodone-acetaminophen (NORCO/VICODIN) 5-325 MG tablet Take 1-2 tablets by mouth every 6 (six) hours as needed for moderate pain.    Marland Kitchen losartan (COZAAR) 25 MG tablet Take 1 tablet (25 mg total) by mouth daily. 30 tablet 11  . metFORMIN (GLUCOPHAGE) 500 MG tablet Take 500 mg by mouth daily.    . Multiple Vitamins-Minerals (CENTRUM SILVER) CHEW 2 soft chews by mouth daily    . potassium chloride SA (K-DUR,KLOR-CON) 20 MEQ tablet Take 1 tablet (20 mEq total) by mouth daily. 90 tablet 3  .  rivaroxaban (XARELTO) 20 MG TABS tablet Take 1 tablet (20 mg total) by mouth daily with supper. 90 tablet 1  . simvastatin (ZOCOR) 10 MG tablet Take 10 mg by mouth daily.    . traZODone (DESYREL) 50 MG tablet Take 50 mg by mouth daily as needed for sleep.      No current facility-administered medications for this encounter.     No Known Allergies  Social History   Social History  . Marital status: Divorced    Spouse name: N/A  . Number of children: N/A  . Years of education: N/A   Occupational History  . Not on file.   Social History Main Topics    . Smoking status: Never Smoker  . Smokeless tobacco: Never Used  . Alcohol use No     Comment: occasional  . Drug use: No  . Sexual activity: Not on file   Other Topics Concern  . Not on file   Social History Narrative  . No narrative on file    Family History  Problem Relation Age of Onset  . Cancer Father   . Heart failure Father   . Diabetes Father   . CAD Mother   . Diabetes Brother   . Hypertension Brother     ROS- All systems are reviewed and negative except as per the HPI above  Physical Exam: Vitals:   07/23/16 0933  BP: 118/62  Pulse: 72  Weight: 281 lb 3.2 oz (127.6 kg)  Height: 5\' 4"  (1.626 m)   Wt Readings from Last 3 Encounters:  07/23/16 281 lb 3.2 oz (127.6 kg)  07/16/16 279 lb (126.6 kg)  07/13/16 279 lb (126.6 kg)    Labs: Lab Results  Component Value Date   NA 139 07/13/2016   K 3.9 07/13/2016   CL 104 07/13/2016   CO2 28 07/13/2016   GLUCOSE 116 (H) 07/13/2016   BUN 16 07/13/2016   CREATININE 0.77 07/13/2016   CALCIUM 9.0 07/13/2016   Lab Results  Component Value Date   INR 1.1 06/03/2016   No results found for: CHOL, HDL, LDLCALC, TRIG   GEN- The patient is well appearing, alert and oriented x 3 today.   Head- normocephalic, atraumatic Eyes-  Sclera clear, conjunctiva pink Ears- hearing intact Oropharynx- clear Neck- supple, no JVP Lymph- no cervical lymphadenopathy Lungs- Clear to ausculation bilaterally, normal work of breathing Heart- irregular rate and rhythm, no murmurs, rubs or gallops, PMI not laterally displaced GI- soft, NT, ND, + BS Extremities- no clubbing, cyanosis, or edema MS- no significant deformity or atrophy Skin- no rash or lesion Psych- euthymic mood, full affect Neuro- strength and sensation are intact  EKG-afib at 92 bpm, LAFB qrs int 90 ms, qrs int 432 ms Epic records reviewed Echo-Study Conclusions  - Left ventricle: The cavity size was normal. There was moderate   concentric hypertrophy.  Systolic function was mildly reduced. The   estimated ejection fraction was in the range of 45% to 50%.   Diffuse hypokinesis. - Aortic valve: Transvalvular velocity was within the normal range.   There was no stenosis. There was no regurgitation. - Mitral valve: Transvalvular velocity was within the normal range.   There was no evidence for stenosis. There was no regurgitation. - Right ventricle: The cavity size was normal. Wall thickness was   normal. Systolic function was normal. - Atrial septum: No defect or patent foramen ovale was identified. - Tricuspid valve: There was trivial regurgitation. - Pulmonary arteries: Systolic pressure was  within the normal   range. PA peak pressure: 25 mm Hg (S).    Assessment and Plan: 1. Persistent afib Successful cardioversion 5/4 but with ERAF 2/2 allergic reaction to the pads and course of prednisone Repeat cardioversion successful with paddles If  SR doesn't hold, because of LV dysfunction, multaq and flecainide will not be options Qtc looks acceptable in SR so I think sotalol or tikosyn would be options, hctz would have to be stopped On young side to opt for amiodarone unless no other options Pt would like to avoid AAD's at this point  Continue xarelto    Has f/u with HTN clinic next week and Physical first week of August F/u with afib clinic as needed   Karen Rasmussen. Karen Rasmussen, [facility] [address] [place], [place] [address] [phone]

## 2016-07-23 NOTE — Telephone Encounter (Signed)
Patient states that she would like to know if she needs to come in sooner than November to see Dr. Radford Pax. Patient had a cardioversion is concerned if she needs to schedule a f/u.

## 2016-07-23 NOTE — Telephone Encounter (Signed)
Patient was seen in [facility] today.  Informed patient that if Dr. Radford Pax needs to see her earlier, that appointment can be arranged pending coronary CT results. Since new scanner is not up yet, confirmed with patient she is asymptomatic at the current time. She will call if symptoms occur.

## 2016-07-30 ENCOUNTER — Ambulatory Visit: Payer: Medicare Other

## 2016-07-31 ENCOUNTER — Other Ambulatory Visit: Payer: Self-pay | Admitting: Cardiology

## 2016-08-04 ENCOUNTER — Ambulatory Visit: Payer: Medicare Other

## 2016-08-19 DIAGNOSIS — M810 Age-related osteoporosis without current pathological fracture: Secondary | ICD-10-CM | POA: Diagnosis not present

## 2016-08-19 DIAGNOSIS — I10 Essential (primary) hypertension: Secondary | ICD-10-CM | POA: Diagnosis not present

## 2016-08-19 DIAGNOSIS — E7800 Pure hypercholesterolemia, unspecified: Secondary | ICD-10-CM | POA: Diagnosis not present

## 2016-08-19 DIAGNOSIS — G4733 Obstructive sleep apnea (adult) (pediatric): Secondary | ICD-10-CM | POA: Diagnosis not present

## 2016-08-19 DIAGNOSIS — Z0000 Encounter for general adult medical examination without abnormal findings: Secondary | ICD-10-CM | POA: Diagnosis not present

## 2016-08-19 DIAGNOSIS — F5101 Primary insomnia: Secondary | ICD-10-CM | POA: Diagnosis not present

## 2016-08-19 DIAGNOSIS — Z6841 Body Mass Index (BMI) 40.0 and over, adult: Secondary | ICD-10-CM | POA: Diagnosis not present

## 2016-08-19 DIAGNOSIS — I4891 Unspecified atrial fibrillation: Secondary | ICD-10-CM | POA: Diagnosis not present

## 2016-08-19 DIAGNOSIS — E119 Type 2 diabetes mellitus without complications: Secondary | ICD-10-CM | POA: Diagnosis not present

## 2016-08-19 DIAGNOSIS — Z7984 Long term (current) use of oral hypoglycemic drugs: Secondary | ICD-10-CM | POA: Diagnosis not present

## 2016-08-24 ENCOUNTER — Other Ambulatory Visit: Payer: Self-pay

## 2016-08-24 ENCOUNTER — Encounter: Payer: Self-pay | Admitting: Cardiology

## 2016-08-24 DIAGNOSIS — I5032 Chronic diastolic (congestive) heart failure: Secondary | ICD-10-CM

## 2016-09-09 ENCOUNTER — Encounter (HOSPITAL_COMMUNITY): Payer: Self-pay

## 2016-09-09 ENCOUNTER — Telehealth: Payer: Self-pay | Admitting: Cardiology

## 2016-09-09 ENCOUNTER — Ambulatory Visit (HOSPITAL_COMMUNITY): Admission: RE | Admit: 2016-09-09 | Payer: Medicare Other | Source: Ambulatory Visit | Admitting: Cardiology

## 2016-09-09 ENCOUNTER — Ambulatory Visit (HOSPITAL_COMMUNITY): Payer: Medicare Other

## 2016-09-09 ENCOUNTER — Ambulatory Visit (HOSPITAL_COMMUNITY)
Admission: RE | Admit: 2016-09-09 | Discharge: 2016-09-09 | Disposition: A | Discharge status: Home / Self Care | Payer: Medicare Other | Source: Ambulatory Visit | Attending: Cardiology | Admitting: Cardiology

## 2016-09-09 DIAGNOSIS — R9439 Abnormal result of other cardiovascular function study: Secondary | ICD-10-CM

## 2016-09-09 DIAGNOSIS — I519 Heart disease, unspecified: Secondary | ICD-10-CM

## 2016-09-09 DIAGNOSIS — R931 Abnormal findings on diagnostic imaging of heart and coronary circulation: Secondary | ICD-10-CM

## 2016-09-09 DIAGNOSIS — R0602 Shortness of breath: Secondary | ICD-10-CM

## 2016-09-09 NOTE — Telephone Encounter (Signed)
Pt called to let us know that she went for her CT today and that she was in afib with HR in the 90's and they couldn't do it.   Pt wants to know what the next plan is? Pt aware that Dr. Radford Pax was not in the office, but we would send her a message and let her know about her CT and someone will call her when Dr. Radford Pax lets [place] know what her next step is.  Pt verbalized appreciation and understanding.

## 2016-09-09 NOTE — Telephone Encounter (Signed)
Please refer to afib clinic

## 2016-09-09 NOTE — Telephone Encounter (Signed)
New message    Pt is calling stating that she went to have CT done today and when she got there she was in Afib so it was not done. Pt is asking what to do now. Please call.

## 2016-09-09 NOTE — Progress Notes (Signed)
Pt here for CT heart, found to be in A fib 90's.  DR Johnsie Cancel called, CT cancelled, he will call Dr Radford Pax.  Pt informed, tearful but understands events.

## 2016-09-09 NOTE — Telephone Encounter (Signed)
Please see telephone note from CT - please get her in with afib clinic ASAP 8/3

## 2016-09-10 NOTE — Telephone Encounter (Signed)
No opening in Afib clinic today.  Pt only symptom at this time is tiredness. Appt made for Monday to discuss options/medication in the Afib clinic. August parking code given to pt Patient is agreeable to plan.

## 2016-09-13 ENCOUNTER — Ambulatory Visit (HOSPITAL_COMMUNITY)
Admission: RE | Admit: 2016-09-13 | Discharge: 2016-09-13 | Disposition: A | Discharge status: Home / Self Care | Payer: Medicare Other | Source: Ambulatory Visit | Attending: Nurse Practitioner | Admitting: Nurse Practitioner

## 2016-09-13 ENCOUNTER — Telehealth: Payer: Self-pay

## 2016-09-13 ENCOUNTER — Encounter (HOSPITAL_COMMUNITY): Payer: Self-pay | Admitting: Nurse Practitioner

## 2016-09-13 VITALS — BP 122/68 | HR 101 | Ht 64.0 in | Wt 282.0 lb

## 2016-09-13 DIAGNOSIS — Z7901 Long term (current) use of anticoagulants: Secondary | ICD-10-CM | POA: Insufficient documentation

## 2016-09-13 DIAGNOSIS — K219 Gastro-esophageal reflux disease without esophagitis: Secondary | ICD-10-CM | POA: Insufficient documentation

## 2016-09-13 DIAGNOSIS — I110 Hypertensive heart disease with heart failure: Secondary | ICD-10-CM | POA: Insufficient documentation

## 2016-09-13 DIAGNOSIS — E559 Vitamin D deficiency, unspecified: Secondary | ICD-10-CM | POA: Insufficient documentation

## 2016-09-13 DIAGNOSIS — Z9889 Other specified postprocedural states: Secondary | ICD-10-CM | POA: Insufficient documentation

## 2016-09-13 DIAGNOSIS — Z87442 Personal history of urinary calculi: Secondary | ICD-10-CM | POA: Diagnosis not present

## 2016-09-13 DIAGNOSIS — G4733 Obstructive sleep apnea (adult) (pediatric): Secondary | ICD-10-CM | POA: Insufficient documentation

## 2016-09-13 DIAGNOSIS — Z8249 Family history of ischemic heart disease and other diseases of the circulatory system: Secondary | ICD-10-CM | POA: Insufficient documentation

## 2016-09-13 DIAGNOSIS — Z833 Family history of diabetes mellitus: Secondary | ICD-10-CM | POA: Diagnosis not present

## 2016-09-13 DIAGNOSIS — Z6841 Body Mass Index (BMI) 40.0 and over, adult: Secondary | ICD-10-CM | POA: Insufficient documentation

## 2016-09-13 DIAGNOSIS — Z809 Family history of malignant neoplasm, unspecified: Secondary | ICD-10-CM | POA: Insufficient documentation

## 2016-09-13 DIAGNOSIS — I4819 Other persistent atrial fibrillation: Secondary | ICD-10-CM

## 2016-09-13 DIAGNOSIS — I481 Persistent atrial fibrillation: Secondary | ICD-10-CM | POA: Diagnosis not present

## 2016-09-13 DIAGNOSIS — Z7984 Long term (current) use of oral hypoglycemic drugs: Secondary | ICD-10-CM | POA: Diagnosis not present

## 2016-09-13 DIAGNOSIS — E119 Type 2 diabetes mellitus without complications: Secondary | ICD-10-CM | POA: Insufficient documentation

## 2016-09-13 DIAGNOSIS — I5032 Chronic diastolic (congestive) heart failure: Secondary | ICD-10-CM | POA: Insufficient documentation

## 2016-09-13 DIAGNOSIS — I420 Dilated cardiomyopathy: Secondary | ICD-10-CM | POA: Insufficient documentation

## 2016-09-13 LAB — BASIC METABOLIC PANEL
Anion gap: 10 (ref 5–15)
BUN: 9 mg/dL (ref 6–20)
CALCIUM: 9.3 mg/dL (ref 8.9–10.3)
CO2: 28 mmol/L (ref 22–32)
CREATININE: 0.78 mg/dL (ref 0.44–1.00)
Chloride: 103 mmol/L (ref 101–111)
Glucose, Bld: 114 mg/dL — ABNORMAL HIGH (ref 65–99)
Potassium: 3.3 mmol/L — ABNORMAL LOW (ref 3.5–5.1)
Sodium: 141 mmol/L (ref 135–145)

## 2016-09-13 LAB — MAGNESIUM: Magnesium: 2.2 mg/dL (ref 1.7–2.4)

## 2016-09-13 NOTE — Telephone Encounter (Signed)
Patient has appointment in the [facility] 8/6.

## 2016-09-13 NOTE — Telephone Encounter (Signed)
-----   Message from [name], MD sent at 09/09/2016  2:05 PM EDT -----   ----- Message ----- From: Josue Hector, MD Sent: 09/09/2016   1:47 PM To: [name], MD, Sherran Needs, NP  Patient showed up for cardiac CT today and was back in afib test cancelled needs f/u for PAF

## 2016-09-14 ENCOUNTER — Telehealth: Payer: Self-pay | Admitting: Pharmacist

## 2016-09-14 ENCOUNTER — Other Ambulatory Visit (HOSPITAL_COMMUNITY): Payer: Self-pay | Admitting: *Deleted

## 2016-09-14 MED ORDER — POTASSIUM CHLORIDE CRYS ER 20 MEQ PO TBCR: 20.0000 meq | EXTENDED_RELEASE_TABLET | Freq: Two times a day (BID) | ORAL | 2 refills | Status: DC

## 2016-09-14 NOTE — Telephone Encounter (Signed)
Medication list reviewed in anticipation of upcoming sotalol initiation. Patient is not taking any contraindicated medications, although she does take trazodone as needed for sleep, which can prolong QTc. Would recommend that pt discontinue this in the 3 days prior to sotalol initiation. Patient is anticoagulated on Xarelto 20mg  daily on the appropriate dose.  Mg at goal, but K noted low at 3.3 yesterday - to be addressed by afib clinic, will need increased K supplementation.  Please ensure that patient has not missed any anticoagulation doses in the 3 weeks prior to sotalol initiation.

## 2016-09-14 NOTE — Progress Notes (Signed)
Primary Care Physician: Kathyrn Lass, MD Referring Physician:   [name] is a 71 y.o. female with a h/o recent history of new onset afib in April  for which pt was placed on anticoagulation and after fully anticoagulated was scheduled for cardioversion. She developed a rash at the cardioversion pad sight and was prescribed prednisone taper. One day of starting the taper, she returned to afib. She is in the afib clinic to discuss options to restore SR. She did feel better in SR. She wished to try cardioversion one more time, hopefully not to be followed with prednisone, possible trigger.  F/u successful cardioversion 6/15. She is feeling better. She was cardioverted with paddles, not the pads that she had an allergic reaction to. No issues with cardioversion this time. She is trying to be more active and lose weight.   Returns to [facility], 8/6. She presented for cardiac CY and was found to be back in AFIb. She is here to discuss antiarrythmic's. Due to reduced EF, she is not a candidate for flecainide, nor multaq. She cannot afford tikosyn, so sotalol is the most likely drug to use. She would like to plan for admission next week. She has not missed any doses of xarelto.  Today, she denies symptoms of palpitations, chest pain, shortness of breath, orthopnea, PND, lower extremity edema, dizziness, presyncope, syncope, or neurologic sequela. The patient is tolerating medications without difficulties and is otherwise without complaint today.   Past Medical History:  Diagnosis Date  . Abdominal pain, left lower quadrant   . Asthmatic bronchitis   . Benign essential HTN 04/26/2016  . Benign hypertensive heart disease without heart failure   . Bursitis of hip   . Chronic diastolic CHF (congestive heart failure) ([place])   . DCM (dilated cardiomyopathy) ([place])    EF 45-50%  . Depression   . Diabetes mellitus without complication ([place])   . GERD (gastroesophageal reflux disease)   . Hypersomnia     . Kidney stones   . Morbid obesity with BMI of 50.0-59.9, adult ([place])   . Obesity   . OSA (obstructive sleep apnea)   . Persistent atrial fibrillation ([place]) 04/26/2016  . Vitamin D deficiency disease    Past Surgical History:  Procedure Laterality Date  . CARDIOVERSION N/A 06/11/2016   Procedure: CARDIOVERSION;  Surgeon: Dorothy Spark, MD;  Location: [facility] ENDOSCOPY;  Service: Cardiovascular;  Laterality: N/A;  . CARDIOVERSION N/A 07/16/2016   Procedure: CARDIOVERSION;  Surgeon: Thayer Headings, MD;  Location: [facility] ENDOSCOPY;  Service: Cardiovascular;  Laterality: N/A;    Current Outpatient Prescriptions  Medication Sig Dispense Refill  . alendronate (FOSAMAX) 70 MG tablet Take 70 mg by mouth once a week. Take with a full glass of water on an empty stomach.    . Biotin w/ Vitamins C & E (HAIR SKIN & NAILS GUMMIES PO) Take by mouth. 2 gummies by mouth daily    . carvedilol (COREG) 3.125 MG tablet Take 1 tablet (3.125 mg total) by mouth 2 (two) times daily with a meal. 60 tablet 11  . Cholecalciferol (VITAMIN D PO) Take 1 tablet by mouth daily.     . Glucosamine 500 MG CAPS Take 1 capsule by mouth daily.    . hydrochlorothiazide (HYDRODIURIL) 25 MG tablet Take 1 tablet (25 mg total) by mouth daily. 90 tablet 0  . HYDROcodone-acetaminophen (NORCO/VICODIN) 5-325 MG tablet Take 1-2 tablets by mouth every 6 (six) hours as needed for moderate pain.    Marland Kitchen  losartan (COZAAR) 25 MG tablet Take 1 tablet (25 mg total) by mouth daily. 30 tablet 11  . metFORMIN (GLUCOPHAGE) 500 MG tablet Take 500 mg by mouth daily.    . Multiple Vitamins-Minerals (CENTRUM SILVER) CHEW 2 soft chews by mouth daily    . potassium chloride SA (K-DUR,KLOR-CON) 20 MEQ tablet Take 1 tablet (20 mEq total) by mouth daily. 90 tablet 3  . rivaroxaban (XARELTO) 20 MG TABS tablet Take 1 tablet (20 mg total) by mouth daily with supper. 90 tablet 1  . simvastatin (ZOCOR) 10 MG tablet Take 10 mg by mouth daily.    . traZODone (DESYREL)  50 MG tablet Take 50 mg by mouth daily as needed for sleep.      No current facility-administered medications for this encounter.     No Known Allergies  Social History   Social History  . Marital status: Divorced    Spouse name: N/A  . Number of children: N/A  . Years of education: N/A   Occupational History  . Not on file.   Social History Main Topics  . Smoking status: Never Smoker  . Smokeless tobacco: Never Used  . Alcohol use No     Comment: occasional  . Drug use: No  . Sexual activity: Not on file   Other Topics Concern  . Not on file   Social History Narrative  . No narrative on file    Family History  Problem Relation Age of Onset  . Cancer Father   . Heart failure Father   . Diabetes Father   . CAD Mother   . Diabetes Brother   . Hypertension Brother     ROS- All systems are reviewed and negative except as per the HPI above  Physical Exam: Vitals:   09/13/16 1427  BP: 122/68  Pulse: (!) 101  Weight: 282 lb (127.9 kg)  Height: 5\' 4"  (1.626 m)   Wt Readings from Last 3 Encounters:  09/13/16 282 lb (127.9 kg)  07/23/16 281 lb 3.2 oz (127.6 kg)  07/16/16 279 lb (126.6 kg)    Labs: Lab Results  Component Value Date   NA 141 09/13/2016   K 3.3 (L) 09/13/2016   CL 103 09/13/2016   CO2 28 09/13/2016   GLUCOSE 114 (H) 09/13/2016   BUN 9 09/13/2016   CREATININE 0.78 09/13/2016   CALCIUM 9.3 09/13/2016   MG 2.2 09/13/2016   Lab Results  Component Value Date   INR 1.1 06/03/2016   No results found for: CHOL, HDL, LDLCALC, TRIG   GEN- The patient is well appearing, alert and oriented x 3 today.   Head- normocephalic, atraumatic Eyes-  Sclera clear, conjunctiva pink Ears- hearing intact Oropharynx- clear Neck- supple, no JVP Lymph- no cervical lymphadenopathy Lungs- Clear to ausculation bilaterally, normal work of breathing Heart- irregular rate and rhythm, no murmurs, rubs or gallops, PMI not laterally displaced GI- soft, NT, ND, +  BS Extremities- no clubbing, cyanosis, or edema MS- no significant deformity or atrophy Skin- no rash or lesion Psych- euthymic mood, full affect Neuro- strength and sensation are intact  EKG-afib at 101 bpm, qrs int 108 ms, qtc 479 ms Epic records reviewed Echo-Study Conclusions  - Left ventricle: The cavity size was normal. There was moderate   concentric hypertrophy. Systolic function was mildly reduced. The   estimated ejection fraction was in the range of 45% to 50%.   Diffuse hypokinesis. - Aortic valve: Transvalvular velocity was within the normal range.  There was no stenosis. There was no regurgitation. - Mitral valve: Transvalvular velocity was within the normal range.   There was no evidence for stenosis. There was no regurgitation. - Right ventricle: The cavity size was normal. Wall thickness was   normal. Systolic function was normal. - Atrial septum: No defect or patent foramen ovale was identified. - Tricuspid valve: There was trivial regurgitation. - Pulmonary arteries: Systolic pressure was within the normal   range. PA peak pressure: 25 mm Hg (S).    Assessment and Plan: 1. Persistent afib Successful cardioversion 5/4 but with ERAF  Pt would like to return to SR She cannot afford tiksosyn  Use of sotalol discussed, precautions, benefits, necessary hospitalization discussed PharmD to screen drugs Bmet/mag today Qtc borderline, will  discuss with Dr. Rayann Heman Continue xarelto, no missed doses    F/u with afib clinic next Tuesday to arrange for hospitalization    Butch Penny C. Haydan Mansouri, [facility] [address] [place], [place] [address] [phone]

## 2016-09-17 ENCOUNTER — Telehealth: Payer: Self-pay | Admitting: Cardiology

## 2016-09-17 NOTE — Telephone Encounter (Signed)
Patient called to report she is being admitted to the hospital Tuesday for Sotalol initiation and she wishes to have her CT done while she is there. Reiterated to patient that the procedure was cancelled due to afib last time, so it depends on the rhythm she is in. She states the nurse in the CT department told her to come over and she could be worked in when she is in a regular rhythm. Instructed her to talk with the nurse on duty while she is admitted to see if CT can be arranged while there. She was grateful for call.

## 2016-09-17 NOTE — Telephone Encounter (Signed)
New message     Pt has a question on the test your ordered (CT), she did not have it done it is should they proceed with procedure

## 2016-09-21 ENCOUNTER — Encounter (HOSPITAL_COMMUNITY): Payer: Self-pay | Admitting: Nurse Practitioner

## 2016-09-21 ENCOUNTER — Inpatient Hospital Stay (HOSPITAL_COMMUNITY)
Admission: AD | Admit: 2016-09-21 | Discharge: 2016-09-24 | DRG: 309 | Disposition: A | Discharge status: Home / Self Care | Payer: Medicare Other | Source: Ambulatory Visit | Attending: Internal Medicine | Admitting: Internal Medicine

## 2016-09-21 ENCOUNTER — Ambulatory Visit (HOSPITAL_COMMUNITY)
Admission: RE | Admit: 2016-09-21 | Discharge: 2016-09-21 | Disposition: A | Discharge status: Home / Self Care | Payer: Medicare Other | Source: Ambulatory Visit | Attending: Nurse Practitioner | Admitting: Nurse Practitioner

## 2016-09-21 ENCOUNTER — Encounter (HOSPITAL_COMMUNITY): Payer: Self-pay | Admitting: General Practice

## 2016-09-21 VITALS — BP 110/64 | HR 105 | Ht 64.0 in | Wt 282.0 lb

## 2016-09-21 DIAGNOSIS — Z91048 Other nonmedicinal substance allergy status: Secondary | ICD-10-CM | POA: Diagnosis not present

## 2016-09-21 DIAGNOSIS — I5032 Chronic diastolic (congestive) heart failure: Secondary | ICD-10-CM | POA: Diagnosis present

## 2016-09-21 DIAGNOSIS — Z87442 Personal history of urinary calculi: Secondary | ICD-10-CM

## 2016-09-21 DIAGNOSIS — Z7901 Long term (current) use of anticoagulants: Secondary | ICD-10-CM

## 2016-09-21 DIAGNOSIS — Z8249 Family history of ischemic heart disease and other diseases of the circulatory system: Secondary | ICD-10-CM

## 2016-09-21 DIAGNOSIS — Z79899 Other long term (current) drug therapy: Secondary | ICD-10-CM | POA: Diagnosis not present

## 2016-09-21 DIAGNOSIS — E119 Type 2 diabetes mellitus without complications: Secondary | ICD-10-CM | POA: Diagnosis present

## 2016-09-21 DIAGNOSIS — M1990 Unspecified osteoarthritis, unspecified site: Secondary | ICD-10-CM | POA: Diagnosis present

## 2016-09-21 DIAGNOSIS — I4819 Other persistent atrial fibrillation: Secondary | ICD-10-CM

## 2016-09-21 DIAGNOSIS — I481 Persistent atrial fibrillation: Secondary | ICD-10-CM

## 2016-09-21 DIAGNOSIS — I4891 Unspecified atrial fibrillation: Secondary | ICD-10-CM | POA: Diagnosis not present

## 2016-09-21 DIAGNOSIS — I110 Hypertensive heart disease with heart failure: Secondary | ICD-10-CM | POA: Diagnosis not present

## 2016-09-21 DIAGNOSIS — Z6841 Body Mass Index (BMI) 40.0 and over, adult: Secondary | ICD-10-CM | POA: Diagnosis not present

## 2016-09-21 DIAGNOSIS — G4733 Obstructive sleep apnea (adult) (pediatric): Secondary | ICD-10-CM | POA: Diagnosis present

## 2016-09-21 DIAGNOSIS — I429 Cardiomyopathy, unspecified: Secondary | ICD-10-CM

## 2016-09-21 DIAGNOSIS — R002 Palpitations: Secondary | ICD-10-CM | POA: Diagnosis not present

## 2016-09-21 DIAGNOSIS — K219 Gastro-esophageal reflux disease without esophagitis: Secondary | ICD-10-CM

## 2016-09-21 DIAGNOSIS — E559 Vitamin D deficiency, unspecified: Secondary | ICD-10-CM | POA: Diagnosis not present

## 2016-09-21 DIAGNOSIS — I420 Dilated cardiomyopathy: Secondary | ICD-10-CM | POA: Diagnosis present

## 2016-09-21 DIAGNOSIS — Z7984 Long term (current) use of oral hypoglycemic drugs: Secondary | ICD-10-CM | POA: Insufficient documentation

## 2016-09-21 DIAGNOSIS — J45909 Unspecified asthma, uncomplicated: Secondary | ICD-10-CM | POA: Insufficient documentation

## 2016-09-21 DIAGNOSIS — E876 Hypokalemia: Secondary | ICD-10-CM | POA: Diagnosis present

## 2016-09-21 DIAGNOSIS — Z809 Family history of malignant neoplasm, unspecified: Secondary | ICD-10-CM

## 2016-09-21 DIAGNOSIS — I10 Essential (primary) hypertension: Secondary | ICD-10-CM

## 2016-09-21 DIAGNOSIS — Z833 Family history of diabetes mellitus: Secondary | ICD-10-CM | POA: Insufficient documentation

## 2016-09-21 DIAGNOSIS — I950 Idiopathic hypotension: Secondary | ICD-10-CM | POA: Diagnosis not present

## 2016-09-21 DIAGNOSIS — Z9889 Other specified postprocedural states: Secondary | ICD-10-CM

## 2016-09-21 DIAGNOSIS — Z5181 Encounter for therapeutic drug level monitoring: Secondary | ICD-10-CM | POA: Diagnosis not present

## 2016-09-21 DIAGNOSIS — I951 Orthostatic hypotension: Secondary | ICD-10-CM | POA: Diagnosis not present

## 2016-09-21 DIAGNOSIS — F329 Major depressive disorder, single episode, unspecified: Secondary | ICD-10-CM | POA: Diagnosis not present

## 2016-09-21 HISTORY — DX: Unspecified osteoarthritis, unspecified site: M19.90

## 2016-09-21 HISTORY — DX: Type 2 diabetes mellitus without complications: E11.9

## 2016-09-21 HISTORY — DX: Obstructive sleep apnea (adult) (pediatric): G47.33

## 2016-09-21 HISTORY — DX: Pure hypercholesterolemia, unspecified: E78.00

## 2016-09-21 HISTORY — DX: Personal history of urinary calculi: Z87.442

## 2016-09-21 HISTORY — DX: Cardiac murmur, unspecified: R01.1

## 2016-09-21 HISTORY — DX: Adjustment disorder with depressed mood: F43.21

## 2016-09-21 HISTORY — DX: Dependence on other enabling machines and devices: Z99.89

## 2016-09-21 LAB — BASIC METABOLIC PANEL
ANION GAP: 7 (ref 5–15)
BUN: 15 mg/dL (ref 6–20)
CHLORIDE: 102 mmol/L (ref 101–111)
CO2: 28 mmol/L (ref 22–32)
Calcium: 8.9 mg/dL (ref 8.9–10.3)
Creatinine, Ser: 0.92 mg/dL (ref 0.44–1.00)
GFR calc non Af Amer: >60 mL/min (ref >60)
Glucose, Bld: 170 mg/dL — ABNORMAL HIGH (ref 65–99)
POTASSIUM: 4.3 mmol/L (ref 3.5–5.1)
SODIUM: 137 mmol/L (ref 135–145)

## 2016-09-21 LAB — GLUCOSE, CAPILLARY
Glucose-Capillary: 135 mg/dL — ABNORMAL HIGH (ref 65–99)
Glucose-Capillary: 142 mg/dL — ABNORMAL HIGH (ref 65–99)

## 2016-09-21 LAB — MAGNESIUM: MAGNESIUM: 2.3 mg/dL (ref 1.7–2.4)

## 2016-09-21 MED ORDER — SIMVASTATIN 10 MG PO TABS
10.0000 mg | ORAL_TABLET | Freq: Every day | ORAL | Status: DC
  Administered 2016-09-21 – 2016-09-24 (×4): 10 mg via ORAL
  Filled 2016-09-21 (×4): qty 1

## 2016-09-21 MED ORDER — HYDROCODONE-ACETAMINOPHEN 5-325 MG PO TABS: 1.0000 | ORAL_TABLET | Freq: Four times a day (QID) | ORAL | Status: DC | PRN

## 2016-09-21 MED ORDER — SOTALOL HCL 120 MG PO TABS
120.0000 mg | ORAL_TABLET | Freq: Two times a day (BID) | ORAL | Status: DC
  Administered 2016-09-21 – 2016-09-22 (×3): 120 mg via ORAL
  Filled 2016-09-21 (×4): qty 1

## 2016-09-21 MED ORDER — GLUCOSAMINE 500 MG PO CAPS: 1.0000 | ORAL_CAPSULE | Freq: Every day | ORAL | Status: DC

## 2016-09-21 MED ORDER — INSULIN ASPART 100 UNIT/ML ~~LOC~~ SOLN
0.0000 [IU] | Freq: Three times a day (TID) | SUBCUTANEOUS | Status: DC
  Administered 2016-09-21: 2 [IU] via SUBCUTANEOUS
  Administered 2016-09-22 (×2): 3 [IU] via SUBCUTANEOUS
  Administered 2016-09-22: 2 [IU] via SUBCUTANEOUS
  Administered 2016-09-23: 8 [IU] via SUBCUTANEOUS
  Administered 2016-09-24 (×2): 2 [IU] via SUBCUTANEOUS

## 2016-09-21 MED ORDER — ACETAMINOPHEN 325 MG PO TABS: 650.0000 mg | ORAL_TABLET | ORAL | Status: DC | PRN

## 2016-09-21 MED ORDER — POTASSIUM CHLORIDE CRYS ER 10 MEQ PO TBCR
20.0000 meq | EXTENDED_RELEASE_TABLET | Freq: Two times a day (BID) | ORAL | Status: DC
  Administered 2016-09-21 – 2016-09-24 (×6): 20 meq via ORAL
  Filled 2016-09-21 (×2): qty 2
  Filled 2016-09-21: qty 1
  Filled 2016-09-21 (×2): qty 2

## 2016-09-21 MED ORDER — CARVEDILOL 3.125 MG PO TABS
3.1250 mg | ORAL_TABLET | Freq: Two times a day (BID) | ORAL | Status: DC
  Administered 2016-09-22 – 2016-09-24 (×5): 3.125 mg via ORAL
  Filled 2016-09-21 (×5): qty 1

## 2016-09-21 MED ORDER — LOSARTAN POTASSIUM 25 MG PO TABS
25.0000 mg | ORAL_TABLET | Freq: Every day | ORAL | Status: DC
  Administered 2016-09-21 – 2016-09-22 (×2): 25 mg via ORAL
  Filled 2016-09-21 (×2): qty 1

## 2016-09-21 MED ORDER — METFORMIN HCL 500 MG PO TABS
500.0000 mg | ORAL_TABLET | Freq: Every day | ORAL | Status: DC
  Administered 2016-09-22 – 2016-09-24 (×2): 500 mg via ORAL
  Filled 2016-09-21 (×2): qty 1

## 2016-09-21 MED ORDER — ALENDRONATE SODIUM 70 MG PO TABS: 70.0000 mg | ORAL_TABLET | ORAL | Status: DC

## 2016-09-21 MED ORDER — RIVAROXABAN 20 MG PO TABS
20.0000 mg | ORAL_TABLET | Freq: Every day | ORAL | Status: DC
  Administered 2016-09-21 – 2016-09-23 (×3): 20 mg via ORAL
  Filled 2016-09-21 (×3): qty 1

## 2016-09-21 MED ORDER — HYDROCHLOROTHIAZIDE 25 MG PO TABS
25.0000 mg | ORAL_TABLET | Freq: Every day | ORAL | Status: DC
Start: 2016-09-21 — End: 2016-09-22
  Administered 2016-09-21: 25 mg via ORAL
  Filled 2016-09-21: qty 1

## 2016-09-21 NOTE — H&P (Addendum)
H&P   Patient ID: Karen Rasmussen; [id number]; [birth year]   Admit date: 09/21/2016 Date of Consult: 09/21/2016  Primary Care Provider: Kathyrn Lass, MD Primary Cardiologist: Dr. Radford Pax Primary Electrophysiologist:  New to Dr. Caryl Comes this admission   Patient Profile:   Karen Rasmussen is a 71 y.o. female with a hx of HTN, presumed DCM, DM, Morbid obesity, OSA/uses the nasal pillow mask with chin strap, and PAFib who is being admitted today to undergo Sotalol initiation.  History of Present Illness:   Ms. Stetzel was dx w/new onset AFib early this year, initiated on xarelto and maintained on diltiazem though up-titrated.  Further w/u disclosed an echo with LVEF 45-50%, no WMA, mod LVH, prompting a stress myoview that was Low risk stress nuclear study with fixed defects in the basal septum and inferior walls (attenuation vs infarct); no ischemia, however given EF 45-50% Dr.Turner recommended CTA coronaries.  The pt underwent DCCV in may unfortunately had a skin reaction the the defib pads and required steroids to tx and had ERAF after her 1st dose.  She underwent a 2nd DCCV in June restoring SR.  She was again in AFib by 09/13/16 when she arrived to her CTA.  She is followed at the AFib clinic and AAD discussed. Due to reduced EF, she is not a candidate for flecainide, nor multaq. She cannot afford tikosyn, so sotalol was felt to be her best option.  D.Carroll, NP note mentions EKGs reviewed with Dr. Rayann Heman and felt OK to initiate Sotalol.  She was seen in the AFib clinic today and planned for admission.  Medications were reviewed by [place], recommended that pt not take trazadone. Pt reported to D. Kayleen Memos, NP that she is not taking and was removed from med list.  The patient confirmed no missed doses of her Xarelto in the last 3 weeks.  Outside of generally tired which is attributed to the AF she is feeling well today.  No recent fevers, illnesses.  She again confirms no missed doses of her Xarelto  in at least 3 weeks, though reports longer even.  No CP, near syncope or syncope, she does feel her AFib w/palpitations and reports when in AF her exertional capacity is significantly worse, and gets winded easily.  For the last couple of times she was briefly in SR she could definitely tell the difference feeling much better in SR.  She is very motivated in regards to weight loss, understanding her obesity plays a role in AF management success.  She reports compliance with CPAP.  We discussed Sotalol initiation procedure, the medicines, monitoring for QT prolongation, risks, plans for DCCV Thursday if not in SR, anticipation for discharge Friday.  She would like to proceed.  She has been waiting to get CTa coronaries done when back in SR, inquires about doing this while here.  Past Medical History:  Diagnosis Date  . Abdominal pain, left lower quadrant   . Asthmatic bronchitis   . Benign essential HTN 04/26/2016  . Benign hypertensive heart disease without heart failure   . Bursitis of hip   . Chronic diastolic CHF (congestive heart failure) ([place])   . DCM (dilated cardiomyopathy) ([place])    EF 45-50%  . Depression   . Diabetes mellitus without complication ([place])   . GERD (gastroesophageal reflux disease)   . Hypersomnia   . Kidney stones   . Morbid obesity with BMI of 50.0-59.9, adult ([place])   . Obesity   . OSA (obstructive sleep apnea)   .  Persistent atrial fibrillation ([place]) 04/26/2016  . Vitamin D deficiency disease     Past Surgical History:  Procedure Laterality Date  . CARDIOVERSION N/A 06/11/2016   Procedure: CARDIOVERSION;  Surgeon: Dorothy Spark, MD;  Location: [facility] ENDOSCOPY;  Service: Cardiovascular;  Laterality: N/A;  . CARDIOVERSION N/A 07/16/2016   Procedure: CARDIOVERSION;  Surgeon: Thayer Headings, MD;  Location: [facility] ENDOSCOPY;  Service: Cardiovascular;  Laterality: N/A;     Inpatient Medications: Scheduled Meds:  Continuous Infusions:  PRN Meds:   Allergies:    No Known Allergies  Social History:   Social History   Social History  . Marital status: Divorced    Spouse name: N/A  . Number of children: N/A  . Years of education: N/A   Occupational History  . Not on file.   Social History Main Topics  . Smoking status: Never Smoker  . Smokeless tobacco: Never Used  . Alcohol use No     Comment: occasional  . Drug use: No  . Sexual activity: Not on file   Other Topics Concern  . Not on file   Social History Narrative  . No narrative on file    Family History:    Family History  Problem Relation Age of Onset  . Cancer Father   . Heart failure Father   . Diabetes Father   . CAD Mother   . Diabetes Brother   . Hypertension Brother      ROS:  Please see the history of present illness.  ROS  All other ROS reviewed and negative.     Physical Exam/Data:   Vitals:   09/21/16 1558 09/21/16 1616  BP:  120/62  Pulse:  83  Resp:  17  Temp:  97.7 F (36.5 C)  TempSrc:  Oral  SpO2:  95%  Weight: 283 lb (128.4 kg)   Height: 5\' 4"  (1.626 m)    No intake or output data in the 24 hours ending 09/21/16 1620 Filed Weights   09/21/16 1558  Weight: 283 lb (128.4 kg)   Body mass index is 48.58 kg/m.  General:  Well nourished, well developed, obese in no acute distress HEENT: normal Lymph: no adenopathy Neck: no JVD Endocrine:  No thryomegaly Vascular: No carotid bruits Cardiac:  IRRR; no murmurs, gallops or rubs are appreciated Lungs:  CTA b/l, no wheezing, rhonchi or rales  Abd: soft, nontender Ext: no edema Musculoskeletal:  No deformities, BUE and BLE strength normal and equal Skin: warm and dry  Neuro:  No gross focal abnormalities noted Psych:  Normal affect   EKG:  The EKG were reviewed with Dr. Caryl Comes and demonstrates:   09/21/16: AFib 105bpm, QRS 76ms, QTc 467m, LAD 09/13/16: AFib 173ms, QRS 146ms, QTc 425ms, LAD 07/23/16: SR 72bpm, PR 18ms, QRS 67ms, QTc 461ms, LAD 07/16/16: SR 68bpm, PR 114ms, QRS 74ms, QTc 454ms  (likely lead reversal,misplacement) Telemetry:  Telemetry was personally reviewed and demonstrates:  AFib, 90's  Relevant CV Studies:  06/01/16: Lexiscan stress test  There was no ST segment deviation noted during stress.  Defect 1: There is a medium defect of moderate severity present in the basal anteroseptal, basal inferior, mid inferior and apical inferior location.  This is a low risk study.   Low risk stress nuclear study with fixed defects in the basal septum and inferior walls (attenuation vs infarct); no ischemia; study not gated due to atrial fibrillation; suggest echo to assess LV function.   05/26/16: TTE Study Conclusions - Left ventricle: The  cavity size was normal. There was moderate   concentric hypertrophy. Systolic function was mildly reduced. The   estimated ejection fraction was in the range of 45% to 50%.   Diffuse hypokinesis. - Aortic valve: Transvalvular velocity was within the normal range.   There was no stenosis. There was no regurgitation. - Mitral valve: Transvalvular velocity was within the normal range.   There was no evidence for stenosis. There was no regurgitation. - Right ventricle: The cavity size was normal. Wall thickness was   normal. Systolic function was normal. - Atrial septum: No defect or patent foramen ovale was identified. - Tricuspid valve: There was trivial regurgitation. - Pulmonary arteries: Systolic pressure was within the normal   range. PA peak pressure: 25 mm Hg (S).  Laboratory Data:  Chemistry  Recent Labs Lab 09/21/16 0932  NA 137  K 4.3  CL 102  CO2 28  GLUCOSE 170*  BUN 15  CREATININE 0.92  CALCIUM 8.9  GFRNONAA >60  GFRAA >60  ANIONGAP 7     Radiology/Studies:  No results found.  Assessment and Plan:   1. Persistent AFib     CHA2DS2Vasc is at least 5, on xarelto, appropriately dosed for creat clearance     K+ 4.3     Mag 2.3     Creat 0.92 (calc. Cr. Cl = 118)     EKGs were reviewed with Dr. Caryl Comes,  sinus QTc is OK to start     Start sotalol 120mg  BID     DCCV Thursday if not in SR      2. HTN     Looks OK     Home meds  3. DM     Home meds  4. 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 Radford Pax wanted done once in SR while here      Signed, Baldwin Jamaica, PA-C  09/21/2016 4:20 PM  The patient was seen, examined and discussed with Baldwin Jamaica, PA-C  and I agree with the above.   71 y.o. female with a hx of HTN, presumed DCM, DM, Morbid obesity, OSA/uses the nasal pillow mask with chin strap, and PAFib who is being admitted today to undergo Sotalol initiation. She has new onset AFib early this year, initiated on xarelto and maintained on diltiazem though up-titrated.  Further w/u disclosed an echo with LVEF 45-50%, no WMA, mod LVH, prompting a stress myoview that was Low risk stress nuclear study with fixed defects in the basal septum and inferior walls (attenuation vs infarct); no ischemia, however given EF 45-50%, CTA coronaries not done as she was in a-fib with RVR. She underwent a DCCV in June restoring SR.  She was again in AFib by 09/13/16 when she arrived to her CTA.  She is followed at the AFib clinic and AAD discussed. Due to reduced EF, she is not a candidate for flecainide, nor multaq. She cannot afford tikosyn, so sotalol was felt to be her best option.   Today, she is asymptomatic, ECG shows ST with LAD, Q wave in te V1-3, non-specific CT-T wave abnormalities. QT/QTc  Today's labs K 4.3, Crea 0.92, Mg 2.3. We will start loading sotalol per protocol, follow electrolytes and ECG closely. We will plan for a coronary CTA prior to the discharge.  [name],  MD 09/21/2016

## 2016-09-21 NOTE — Progress Notes (Signed)
Primary Care Physician: Kathyrn Lass, MD Referring Physician: Dr. Edrick Kins Karen Rasmussen is a 71 y.o. female with a h/o recent history of new onset afib in April  for which pt was placed on anticoagulation and after fully anticoagulated was scheduled for cardioversion. She developed a rash at the cardioversion pad sight and was prescribed prednisone taper. One day of starting the taper, she returned to afib. She is in the afib clinic to discuss options to restore SR. She did feel better in SR. She wished to try cardioversion one more time, hopefully not to be followed with prednisone, possible trigger.  F/u successful cardioversion 6/15. She is feeling better. She was cardioverted with paddles, not the pads that she had an allergic reaction to. No issues with cardioversion this time. She is trying to be more active and lose weight.   Returns to [facility] clinic, 8/6. She presented for cardiac CT that was ordered by Dr. Radford Pax and was found to be back in AFIb. She is here to discuss antiarrythmic's. Due to reduced EF, she is not a candidate for flecainide, nor multaq. She cannot afford tikosyn, so sotalol is the most likely drug to use. She would like to plan for admission next week. She has not missed any doses of xarelto.  Return to afib clinic,8/14, for hospitalization for sotalol, cannot afford tikosyn. No missed doses of xarelto. Meds reviewed by Pharmacy and recommended that pt not take trazadone. Pt states that she is not taking and will remove form med list today. She is symptomatic with fatigue and shortness of breath with afib. QTC last week was reviewed with Dr. Rayann Heman and he felt was ok for admission. 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 Labs pending. She increased K+ last week for K+ at 3.3.  Labs back and Kt/mag at 4.3/2.3 respectively,Crcl cal at 118.18.  Today, she denies symptoms of   chest pain,  orthopnea, PND, lower extremity edema, dizziness, presyncope, syncope, or  neurologic sequela.  Positive for dyspnea/fatigue.The patient is tolerating medications without difficulties and is otherwise without complaint today.   Past Medical History:  Diagnosis Date  . Abdominal pain, left lower quadrant   . Asthmatic bronchitis   . Benign essential HTN 04/26/2016  . Benign hypertensive heart disease without heart failure   . Bursitis of hip   . Chronic diastolic CHF (congestive heart failure) ([place])   . DCM (dilated cardiomyopathy) ([place])    EF 45-50%  . Depression   . Diabetes mellitus without complication ([place])   . GERD (gastroesophageal reflux disease)   . Hypersomnia   . Kidney stones   . Morbid obesity with BMI of 50.0-59.9, adult ([place])   . Obesity   . OSA (obstructive sleep apnea)   . Persistent atrial fibrillation ([place]) 04/26/2016  . Vitamin D deficiency disease    Past Surgical History:  Procedure Laterality Date  . CARDIOVERSION N/A 06/11/2016   Procedure: CARDIOVERSION;  Surgeon: Dorothy Spark, MD;  Location: [facility] ENDOSCOPY;  Service: Cardiovascular;  Laterality: N/A;  . CARDIOVERSION N/A 07/16/2016   Procedure: CARDIOVERSION;  Surgeon: Thayer Headings, MD;  Location: [facility] ENDOSCOPY;  Service: Cardiovascular;  Laterality: N/A;    Current Outpatient Prescriptions  Medication Sig Dispense Refill  . alendronate (FOSAMAX) 70 MG tablet Take 70 mg by mouth once a week. Take with a full glass of water on an empty stomach.    . Biotin w/ Vitamins C & E (HAIR SKIN &  NAILS GUMMIES PO) Take by mouth. 2 gummies by mouth daily    . carvedilol (COREG) 3.125 MG tablet Take 1 tablet (3.125 mg total) by mouth 2 (two) times daily with a meal. 60 tablet 11  . Cholecalciferol (VITAMIN D PO) Take 1 tablet by mouth daily.     . Glucosamine 500 MG CAPS Take 1 capsule by mouth daily.    . hydrochlorothiazide (HYDRODIURIL) 25 MG tablet Take 1 tablet (25 mg total) by mouth daily. 90 tablet 0  . HYDROcodone-acetaminophen (NORCO/VICODIN) 5-325 MG tablet Take 1-2 tablets by  mouth every 6 (six) hours as needed for moderate pain.    Marland Kitchen losartan (COZAAR) 25 MG tablet Take 1 tablet (25 mg total) by mouth daily. 30 tablet 11  . metFORMIN (GLUCOPHAGE) 500 MG tablet Take 500 mg by mouth daily.    . Multiple Vitamins-Minerals (CENTRUM SILVER) CHEW 2 soft chews by mouth daily    . potassium chloride SA (K-DUR,KLOR-CON) 20 MEQ tablet Take 1 tablet (20 mEq total) by mouth 2 (two) times daily. 60 tablet 2  . psyllium (METAMUCIL SMOOTH TEXTURE) 28 % packet Take 1 packet by mouth 2 (two) times daily.    . rivaroxaban (XARELTO) 20 MG TABS tablet Take 1 tablet (20 mg total) by mouth daily with supper. 90 tablet 1  . simvastatin (ZOCOR) 10 MG tablet Take 10 mg by mouth daily.    . traZODone (DESYREL) 50 MG tablet Take 50 mg by mouth daily as needed for sleep.      No current facility-administered medications for this encounter.     No Known Allergies  Social History   Social History  . Marital status: Divorced    Spouse name: N/A  . Number of children: N/A  . Years of education: N/A   Occupational History  . Not on file.   Social History Main Topics  . Smoking status: Never Smoker  . Smokeless tobacco: Never Used  . Alcohol use No     Comment: occasional  . Drug use: No  . Sexual activity: Not on file   Other Topics Concern  . Not on file   Social History Narrative  . No narrative on file    Family History  Problem Relation Age of Onset  . Cancer Father   . Heart failure Father   . Diabetes Father   . CAD Mother   . Diabetes Brother   . Hypertension Brother     ROS- All systems are reviewed and negative except as per the HPI above  Physical Exam: Vitals:   09/21/16 0920  BP: 110/64  Pulse: (!) 105  Weight: 282 lb (127.9 kg)  Height: 5\' 4"  (1.626 m)   Wt Readings from Last 3 Encounters:  09/21/16 282 lb (127.9 kg)  09/13/16 282 lb (127.9 kg)  07/23/16 281 lb 3.2 oz (127.6 kg)    Labs: Lab Results  Component Value Date   NA 141  09/13/2016   K 3.3 (L) 09/13/2016   CL 103 09/13/2016   CO2 28 09/13/2016   GLUCOSE 114 (H) 09/13/2016   BUN 9 09/13/2016   CREATININE 0.78 09/13/2016   CALCIUM 9.3 09/13/2016   MG 2.2 09/13/2016   Lab Results  Component Value Date   INR 1.1 06/03/2016   No results found for: CHOL, HDL, LDLCALC, TRIG   GEN- The patient is well appearing, alert and oriented x 3 today.   Head- normocephalic, atraumatic Eyes-  Sclera clear, conjunctiva pink Ears- hearing intact Oropharynx-  clear Neck- supple, no JVP Lymph- no cervical lymphadenopathy Lungs- Clear to ausculation bilaterally, normal work of breathing Heart- irregular rate and rhythm, no murmurs, rubs or gallops, PMI not laterally displaced GI- soft, NT, ND, + BS Extremities- no clubbing, cyanosis, or edema MS- no significant deformity or atrophy Skin- no rash or lesion Psych- euthymic mood, full affect Neuro- strength and sensation are intact  EKG-afib at 105 bpm, qrs int 88 ms, qtc 473 ms Epic records reviewed Echo-Study Conclusions  - Left ventricle: The cavity size was normal. There was moderate   concentric hypertrophy. Systolic function was mildly reduced. The   estimated ejection fraction was in the range of 45% to 50%.   Diffuse hypokinesis. - Aortic valve: Transvalvular velocity was within the normal range.   There was no stenosis. There was no regurgitation. - Mitral valve: Transvalvular velocity was within the normal range.   There was no evidence for stenosis. There was no regurgitation. - Right ventricle: The cavity size was normal. Wall thickness was   normal. Systolic function was normal. - Atrial septum: No defect or patent foramen ovale was identified. - Tricuspid valve: There was trivial regurgitation. - Pulmonary arteries: Systolic pressure was within the normal   range. PA peak pressure: 25 mm Hg (S).    Assessment and Plan: 1. Persistent symptomatic afib Successful cardioversion 5/4 but with  ERAF  Pt would like to return to SR 2/2 to feeling better in SR She cannot afford tikosyn  Use of sotalol discussed, precautions, benefits, necessary hospitalization discussed PharmD screened drugs and she is not on any qtc prolonging drugs Bmet/mag today Qtc over 440 ms, but LAFB present, reviewed with  Dr. Rayann Heman last week, he felt ok to start sotalol Continue xarelto for chadsvasc score of at least 5, states no missed doses for at least 3 weeks Bmet/mag drawn and with K+/ mag levels good to start drug.Crcl cal at 118.18 Denies any use of benadryl or OTC supplements  Butch Penny C. [facility] [address] [place], [place] [address] [place] Tomicka Lover, [facility] [address] [place], [place] [address] [phone]

## 2016-09-21 NOTE — Progress Notes (Addendum)
PHARMACIST - PHYSICIAN COMMUNICATION  CONCERNING: P&T Medication Policy Regarding Oral Bisphosphonates  RECOMMENDATION: Your order for alendronate (Fosamax), ibandronate (Boniva), or risedronate (Actonel) has been discontinued at this time.  If the patient's post-hospital medical condition warrants safe use of this class of drugs, please resume the pre-hospital regimen upon discharge.  DESCRIPTION:  Alendronate (Fosamax), ibandronate (Boniva), and risedronate (Actonel) can cause severe esophageal erosions in patients who are unable to remain upright at least 30 minutes after taking this medication.   Since brief interruptions in therapy are thought to have minimal impact on bone mineral density, the [facility] has established that bisphosphonate orders should be routinely discontinued during hospitalization.   To override this safety policy and permit administration of Boniva, Fosamax, or Actonel in the hospital, prescribers must write "DO NOT HOLD" in the comments section when placing the order for this class of medications.    PHARMACIST - PHYSICIAN ORDER COMMUNICATION  CONCERNING: P&T Medication Policy on Herbal Medications  DESCRIPTION:  This patient's order for:  Glucosamine  has been noted.  This product(s) is classified as an "herbal" or natural product. Due to a lack of definitive safety studies or FDA approval, nonstandard manufacturing practices, plus the potential risk of unknown drug-drug interactions while on inpatient medications, the Pharmacy and Therapeutics Committee does not permit the use of "herbal" or natural products of this type within [facility].   ACTION TAKEN: The pharmacy department is unable to verify this order at this time and your patient has been informed of this safety policy. Please reevaluate patient's clinical condition at discharge and address if the herbal or natural product(s) should be resumed at that  time.    Deetra Booton S. Alford Highland, PharmD, [place] Clinical Staff Pharmacist Pager [phone]

## 2016-09-22 DIAGNOSIS — I950 Idiopathic hypotension: Secondary | ICD-10-CM

## 2016-09-22 DIAGNOSIS — E876 Hypokalemia: Secondary | ICD-10-CM

## 2016-09-22 LAB — BASIC METABOLIC PANEL
ANION GAP: 10 (ref 5–15)
BUN: 13 mg/dL (ref 6–20)
CALCIUM: 8.8 mg/dL — AB (ref 8.9–10.3)
CO2: 26 mmol/L (ref 22–32)
Chloride: 102 mmol/L (ref 101–111)
Creatinine, Ser: 0.8 mg/dL (ref 0.44–1.00)
GFR calc non Af Amer: >60 mL/min (ref >60)
GLUCOSE: 149 mg/dL — AB (ref 65–99)
POTASSIUM: 3.7 mmol/L (ref 3.5–5.1)
Sodium: 138 mmol/L (ref 135–145)

## 2016-09-22 LAB — GLUCOSE, CAPILLARY
GLUCOSE-CAPILLARY: 154 mg/dL — AB (ref 65–99)
GLUCOSE-CAPILLARY: 148 mg/dL — AB (ref 65–99)
Glucose-Capillary: 160 mg/dL — ABNORMAL HIGH (ref 65–99)

## 2016-09-22 LAB — MAGNESIUM: Magnesium: 2.2 mg/dL (ref 1.7–2.4)

## 2016-09-22 MED ORDER — POTASSIUM CHLORIDE CRYS ER 10 MEQ PO TBCR
10.0000 meq | EXTENDED_RELEASE_TABLET | Freq: Once | ORAL | Status: AC
  Administered 2016-09-22: 10 meq via ORAL
  Filled 2016-09-22: qty 1

## 2016-09-22 MED ORDER — HYDROCHLOROTHIAZIDE 25 MG PO TABS
12.5000 mg | ORAL_TABLET | Freq: Every day | ORAL | Status: DC
  Administered 2016-09-22: 12.5 mg via ORAL
  Filled 2016-09-22: qty 1

## 2016-09-22 NOTE — Progress Notes (Signed)
Progress Note  Patient Name: Karen Rasmussen Date of Encounter: 09/22/2016  Primary Cardiologist: Dr. Radford Pax  Subjective   Feels well this morning, up/down last night (as usual) to use the BR, didn't use her CPAP, no CP or SOB, tolerating medicine so far  Inpatient Medications    Scheduled Meds: . carvedilol  3.125 mg Oral BID WC  . hydrochlorothiazide  25 mg Oral Daily  . insulin aspart  0-15 Units Subcutaneous TID WC  . losartan  25 mg Oral Daily  . metFORMIN  500 mg Oral Q breakfast  . potassium chloride SA  20 mEq Oral BID  . rivaroxaban  20 mg Oral Q supper  . simvastatin  10 mg Oral Daily  . sotalol  120 mg Oral Q12H   Continuous Infusions:  PRN Meds: acetaminophen, HYDROcodone-acetaminophen   Vital Signs    Vitals:   09/21/16 1616 09/21/16 1938 09/22/16 0019 09/22/16 0447  BP: 120/62 117/69 (!) 103/48 110/66  Pulse: 83 99 80 71  Resp: 17 (!) 21 20 20   Temp: 97.7 F (36.5 C) 98.2 F (36.8 C) 98.1 F (36.7 C) 98 F (36.7 C)  TempSrc: Oral     SpO2: 95% 98% 98% 97%  Weight:    279 lb 1.6 oz (126.6 kg)  Height:        Intake/Output Summary (Last 24 hours) at 09/22/16 0724 Last data filed at 09/22/16 0447  Gross per 24 hour  Intake              120 ml  Output                0 ml  Net              120 ml   Filed Weights   09/21/16 1558 09/22/16 0447  Weight: 283 lb (128.4 kg) 279 lb 1.6 oz (126.6 kg)    Telemetry    AFib 70-80's - Personally Reviewed  ECG    AFib, QTc stable from last - Personally Reviewed  Physical Exam   GEN: No acute distress.   Neck: No JVD Cardiac: IRRR, no murmurs, rubs, or gallops.  Respiratory: CTA b/l. GI: Soft, nontender, obese  MS: No edema; No deformity. Neuro:  Nonfocal  Psych: Normal affect   Labs    Chemistry Recent Labs Lab 09/21/16 0932  NA 137  K 4.3  CL 102  CO2 28  GLUCOSE 170*  BUN 15  CREATININE 0.92  CALCIUM 8.9  GFRNONAA >60  GFRAA >60  ANIONGAP 7      Radiology    No results  found.  Cardiac Studies   06/01/16: Lexiscan stress test  There was no ST segment deviation noted during stress.  Defect 1: There is a medium defect of moderate severity present in the basal anteroseptal, basal inferior, mid inferior and apical inferior location.  This is a low risk study.  Low risk stress nuclear study with fixed defects in the basal septum and inferior walls (attenuation vs infarct); no ischemia; study not gated due to atrial fibrillation; suggest echo to assess LV function.   05/26/16: TTE Study Conclusions - Left ventricle: The cavity size was normal. There was moderate concentric hypertrophy. Systolic function was mildly reduced. The estimated ejection fraction was in the range of 45% to 50%. Diffuse hypokinesis. - Aortic valve: Transvalvular velocity was within the normal range. There was no stenosis. There was no regurgitation. - Mitral valve: Transvalvular velocity was within the normal range. There  was no evidence for stenosis. There was no regurgitation. - Right ventricle: The cavity size was normal. Wall thickness was normal. Systolic function was normal. - Atrial septum: No defect or patent foramen ovale was identified. - Tricuspid valve: There was trivial regurgitation. - Pulmonary arteries: Systolic pressure was within the normal range. PA peak pressure: 25 mm Hg (S).  Patient Profile     71 y.o. female with a hx of HTN, presumed DCM, DM, Morbid obesity, OSA/uses the nasal pillow mask with chin strap, and PAFib who was admitted to undergo Sotalol initiation.  Assessment & Plan    1. Persistent AFib     CHA2DS2Vasc is at least 5, on xarelto, appropriately dosed for creat clearance     K+ pending     Mag pending     Creat pending     EKG not ordered for last night, this am is with stable QT     Start sotalol 120mg  BID     DCCV tomorrow if not in SR      2. HTN     Looks OK     Home meds  3. DM     Home meds  4. Mild  presumed DCM, EF 45-50%       Exam does not suggest fluid OL     On BB/ARB     Stress test was low risk study, will try to get CTA coronaries that Dr. Radford Pax wanted done once in SR while here  5. OSA     Discussed importance of CPAP every night, she reports "95%" compliance, some nights difficult when up/down to the BR  6. Morbid obesity     She is motivated and understands improtance of weight loss for AFib management and overall health  Signed, Baldwin Jamaica, PA-C  09/22/2016, 7:24 AM    As above. Blood pressure is a little bit low.  Anticipate cardioversion tomorrow.  QT intervals within acceptable range on sotalol  Low potassium has required repletion; she may just need potassium replacement as opposed to a potassium sparing diuretic as her blood pressure is low precluding the latter  Anticipate home on Friday.

## 2016-09-22 NOTE — Plan of Care (Signed)
Problem: Pain Managment: Goal: General experience of comfort will improve Outcome: Completed/Met Date Met: 09/22/16 Denies pain   Problem: Skin Integrity: Goal: Risk for impaired skin integrity will decrease Outcome: Progressing Verbalizes understanding of need for frequent repositioning to prevent breakdown

## 2016-09-22 NOTE — Progress Notes (Signed)
PT refused

## 2016-09-22 NOTE — Discharge Instructions (Addendum)
You have an appointment set up with the Atrial Fibrillation Clinic.  Multiple studies have shown that being followed by a dedicated atrial fibrillation clinic in addition to the standard care you receive from your other physicians improves health. We believe that enrollment in the atrial fibrillation clinic will allow us to better care for you.  ° °The phone number to the Atrial Fibrillation Clinic is [phone]. The clinic is staffed Monday through Friday from 8:30am to 5pm. ° °Parking Directions: The clinic is located in the Heart and Vascular Building connected to [facility]. °1)From [address] turn on to [address] and go to the 3rd entrance  (Heart and Vascular entrance) on the right. °2)Look to the right for Heart &Vascular Parking Garage. °3)A code for the entrance is required please call the clinic to receive this.   °4)Take the elevators to the 1st floor. Registration is in the room with the glass walls at the end of the hallway. ° °If you have any trouble parking or locating the clinic, please don’t hesitate to call [phone]. ° ° °Information on my medicine - XARELTO® (Rivaroxaban) ° °This medication education was reviewed with me or my healthcare representative as part of my discharge preparation.  The pharmacist that spoke with me during my hospital stay was:  Dang, Thuy Dien, RPH ° °Why was Xarelto® prescribed for you? °Xarelto® was prescribed for you to reduce the risk of a blood clot forming that can cause a stroke if you have a medical condition called atrial fibrillation (a type of irregular heartbeat). ° °What do you need to know about xarelto® ? °Take your Xarelto® ONCE DAILY at the same time every day with your evening meal. °If you have difficulty swallowing the tablet whole, you may crush it and mix in applesauce just prior to taking your dose. ° °Take Xarelto® exactly as prescribed by your doctor and DO NOT stop taking Xarelto® without talking to the doctor who  prescribed the medication.  Stopping without other stroke prevention medication to take the place of Xarelto® may increase your risk of developing a clot that causes a stroke.  Refill your prescription before you run out. ° °After discharge, you should have regular check-up appointments with your healthcare provider that is prescribing your Xarelto®.  In the future your dose may need to be changed if your kidney function or weight changes by a significant amount. ° °What do you do if you miss a dose? °If you are taking Xarelto® ONCE DAILY and you miss a dose, take it as soon as you remember on the same day then continue your regularly scheduled once daily regimen the next day. Do not take two doses of Xarelto® at the same time or on the same day.  ° °Important Safety Information °A possible side effect of Xarelto® is bleeding. You should call your healthcare provider right away if you experience any of the following: °? Bleeding from an injury or your nose that does not stop. °? Unusual colored urine (red or dark brown) or unusual colored stools (red or black). °? Unusual bruising for unknown reasons. °? A serious fall or if you hit your head (even if there is no bleeding). ° °Some medicines may interact with Xarelto® and might increase your risk of bleeding while on Xarelto®. To help avoid this, consult your healthcare provider or pharmacist prior to using any new prescription or non-prescription medications, including herbals, vitamins, non-steroidal anti-inflammatory drugs (NSAIDs) and supplements. ° °This website has more information   on Xarelto: [email or web address]

## 2016-09-23 ENCOUNTER — Encounter (HOSPITAL_COMMUNITY)
Admission: AD | Disposition: A | Discharge status: Home / Self Care | Payer: Self-pay | Source: Ambulatory Visit | Attending: Internal Medicine

## 2016-09-23 ENCOUNTER — Encounter (HOSPITAL_COMMUNITY): Payer: Self-pay | Admitting: *Deleted

## 2016-09-23 ENCOUNTER — Inpatient Hospital Stay (HOSPITAL_COMMUNITY): Payer: Medicare Other | Admitting: Anesthesiology

## 2016-09-23 ENCOUNTER — Other Ambulatory Visit: Payer: Self-pay

## 2016-09-23 DIAGNOSIS — I951 Orthostatic hypotension: Secondary | ICD-10-CM

## 2016-09-23 DIAGNOSIS — E876 Hypokalemia: Secondary | ICD-10-CM

## 2016-09-23 HISTORY — PX: CARDIOVERSION: SHX1299

## 2016-09-23 LAB — BASIC METABOLIC PANEL
ANION GAP: 7 (ref 5–15)
BUN: 12 mg/dL (ref 6–20)
CALCIUM: 8.9 mg/dL (ref 8.9–10.3)
CO2: 29 mmol/L (ref 22–32)
Chloride: 103 mmol/L (ref 101–111)
Creatinine, Ser: 0.86 mg/dL (ref 0.44–1.00)
GFR calc Af Amer: >60 mL/min (ref >60)
Glucose, Bld: 132 mg/dL — ABNORMAL HIGH (ref 65–99)
POTASSIUM: 3.6 mmol/L (ref 3.5–5.1)
SODIUM: 139 mmol/L (ref 135–145)

## 2016-09-23 LAB — GLUCOSE, CAPILLARY
GLUCOSE-CAPILLARY: 133 mg/dL — AB (ref 65–99)
GLUCOSE-CAPILLARY: 262 mg/dL — AB (ref 65–99)
GLUCOSE-CAPILLARY: 110 mg/dL — AB (ref 65–99)
Glucose-Capillary: 149 mg/dL — ABNORMAL HIGH (ref 65–99)

## 2016-09-23 LAB — MAGNESIUM: MAGNESIUM: 2.1 mg/dL (ref 1.7–2.4)

## 2016-09-23 SURGERY — CARDIOVERSION
Anesthesia: General

## 2016-09-23 MED ORDER — SODIUM CHLORIDE 0.9% FLUSH
3.0000 mL | Freq: Two times a day (BID) | INTRAVENOUS | Status: DC
  Administered 2016-09-23 – 2016-09-24 (×3): 3 mL via INTRAVENOUS

## 2016-09-23 MED ORDER — LIDOCAINE HCL (CARDIAC) 20 MG/ML IV SOLN
INTRAVENOUS | Status: DC | PRN
  Administered 2016-09-23: 40 mg via INTRAVENOUS

## 2016-09-23 MED ORDER — POTASSIUM CHLORIDE CRYS ER 20 MEQ PO TBCR
30.0000 meq | EXTENDED_RELEASE_TABLET | Freq: Once | ORAL | Status: AC
  Administered 2016-09-23: 30 meq via ORAL
  Filled 2016-09-23: qty 1

## 2016-09-23 MED ORDER — SODIUM CHLORIDE 0.9 % IV SOLN
INTRAVENOUS | Status: DC
  Administered 2016-09-23 (×2): via INTRAVENOUS

## 2016-09-23 MED ORDER — SPIRONOLACTONE 25 MG PO TABS
12.5000 mg | ORAL_TABLET | Freq: Every day | ORAL | Status: DC
  Administered 2016-09-23 – 2016-09-24 (×2): 12.5 mg via ORAL
  Filled 2016-09-23 (×2): qty 1

## 2016-09-23 MED ORDER — SODIUM CHLORIDE 0.9% FLUSH: 3.0000 mL | INTRAVENOUS | Status: DC | PRN

## 2016-09-23 MED ORDER — PROPOFOL 10 MG/ML IV BOLUS
INTRAVENOUS | Status: DC | PRN
Start: 2016-09-23 — End: 2016-09-23
  Administered 2016-09-23: 70 mg via INTRAVENOUS

## 2016-09-23 MED ORDER — SOTALOL HCL 80 MG PO TABS
80.0000 mg | ORAL_TABLET | Freq: Two times a day (BID) | ORAL | Status: DC
  Administered 2016-09-23 – 2016-09-24 (×3): 80 mg via ORAL
  Filled 2016-09-23 (×3): qty 1

## 2016-09-23 MED ORDER — HYDROCORTISONE 1 % EX CREA
1.0000 "application " | TOPICAL_CREAM | Freq: Three times a day (TID) | CUTANEOUS | Status: DC | PRN
  Filled 2016-09-23: qty 28

## 2016-09-23 MED ORDER — SODIUM CHLORIDE 0.9 % IV SOLN: 250.0000 mL | INTRAVENOUS | Status: DC

## 2016-09-23 NOTE — Progress Notes (Signed)
CCMD reports patient QTc is 389

## 2016-09-23 NOTE — Anesthesia Postprocedure Evaluation (Signed)
Anesthesia Post Note  Patient: Karen Rasmussen  Procedure(s) Performed: Procedure(s) (LRB): CARDIOVERSION (N/A)     Patient location during evaluation: Endoscopy Anesthesia Type: General Level of consciousness: awake and alert Pain management: pain level controlled Vital Signs Assessment: post-procedure vital signs reviewed and stable Respiratory status: spontaneous breathing, nonlabored ventilation, respiratory function stable and patient connected to nasal cannula oxygen Cardiovascular status: blood pressure returned to baseline and stable Postop Assessment: no signs of nausea or vomiting Anesthetic complications: no    Last Vitals:  Vitals:   09/23/16 1321 09/23/16 1335  BP: 107/61 (!) 102/53  Pulse:  (!) 58  Resp: 20 16  Temp: 36.9 C   SpO2: 97% 94%    Last Pain:  Vitals:   09/23/16 1321  TempSrc: Oral  PainSc:                  [name]

## 2016-09-23 NOTE — Anesthesia Preprocedure Evaluation (Addendum)
Anesthesia Evaluation  Patient identified by MRN, date of birth, ID band Patient awake    Reviewed: Allergy & Precautions, NPO status , Patient's Chart, lab work & pertinent test results, reviewed documented beta blocker date and time   Airway Mallampati: IV  TM Distance: <3 FB   Mouth opening: Limited Mouth Opening  Dental  (+) Teeth Intact, Dental Advisory Given   Pulmonary asthma , sleep apnea and Continuous Positive Airway Pressure Ventilation ,    breath sounds clear to auscultation       Cardiovascular hypertension, Pt. on home beta blockers and Pt. on medications +CHF  + dysrhythmias Atrial Fibrillation  Rhythm:Irregular     Neuro/Psych PSYCHIATRIC DISORDERS Depression negative neurological ROS     GI/Hepatic Neg liver ROS, GERD  Medicated and Controlled,  Endo/Other  diabetes, Type 2, Oral Hypoglycemic Agents  Renal/GU negative Renal ROS     Musculoskeletal  (+) Arthritis , Osteoarthritis,    Abdominal   Peds  Hematology negative hematology ROS (+)   Anesthesia Other Findings Day of surgery medications reviewed with the patient.  Reproductive/Obstetrics                           Lab Results  Component Value Date   WBC 8.9 07/13/2016   HGB 13.5 07/13/2016   HCT 42.3 07/13/2016   MCV 89.2 07/13/2016   PLT 244 07/13/2016   EKG: atrial fibrillation.  Echo: - Left ventricle: The cavity size was normal. There was moderate   concentric hypertrophy. Systolic function was mildly reduced. The   estimated ejection fraction was in the range of 45% to 50%.   Diffuse hypokinesis. - Aortic valve: Transvalvular velocity was within the normal range.   There was no stenosis. There was no regurgitation. - Mitral valve: Transvalvular velocity was within the normal range.   There was no evidence for stenosis. There was no regurgitation. - Right ventricle: The cavity size was normal. Wall  thickness was   normal. Systolic function was normal. - Atrial septum: No defect or patent foramen ovale was identified. - Tricuspid valve: There was trivial regurgitation. - Pulmonary arteries: Systolic pressure was within the normal   range. PA peak pressure: 25 mm Hg (S).   Anesthesia Physical Anesthesia Plan  ASA: III  Anesthesia Plan: General   Post-op Pain Management:    Induction: Intravenous  PONV Risk Score and Plan: Propofol infusion  Airway Management Planned: Natural Airway  Additional Equipment:   Intra-op Plan:   Post-operative Plan:   Informed Consent: I have reviewed the patients History and Physical, chart, labs and discussed the procedure including the risks, benefits and alternatives for the proposed anesthesia with the patient or authorized representative who has indicated his/her understanding and acceptance.   Dental advisory given  Plan Discussed with: CRNA, Anesthesiologist and Surgeon  Anesthesia Plan Comments:        Anesthesia Quick Evaluation

## 2016-09-23 NOTE — Transfer of Care (Signed)
Immediate Anesthesia Transfer of Care Note  Patient: Karen Rasmussen  Procedure(s) Performed: Procedure(s): CARDIOVERSION (N/A)  Patient Location: Endoscopy Unit  Anesthesia Type:General  Level of Consciousness: awake, alert , oriented and patient cooperative  Airway & Oxygen Therapy: Patient Spontanous Breathing and Patient connected to nasal cannula oxygen  Post-op Assessment: Report given to RN, Post -op Vital signs reviewed and stable and Patient moving all extremities X 4  Post vital signs: Reviewed and stable  Last Vitals:  Vitals:   09/23/16 1100 09/23/16 1223  BP: (!) 97/52 (!) 84/59  Pulse:  86  Resp: 20 12  Temp: 36.7 C 36.4 C  SpO2: 95% 96%    Last Pain:  Vitals:   09/23/16 1223  TempSrc: Oral  PainSc: 0-No pain      Patients Stated Pain Goal: 0 (91/22/58 3462)  Complications: No apparent anesthesia complications

## 2016-09-23 NOTE — Discharge Summary (Signed)
ELECTROPHYSIOLOGY PROCEDURE DISCHARGE SUMMARY    Patient ID: Karen Rasmussen,  MRN: [id number], DOB/AGE: [birth year] 69 y.o.  Admit date: 09/21/2016 Discharge date: 09/24/16  Primary Care Physician: Kathyrn Lass, MD Primary Cardiologist: Dr. Radford Pax  Primary Discharge Diagnosis:  1.  persistent atrial fibrillation status post Sotalol loading this admission      CHA2DS2Vasc is at least 5, on xarelto  Secondary Discharge Diagnosis:  1. HTN 3. 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 Obesity 6. OSA/CPAP compliant 7. Hypokalemia   Allergies  Allergen Reactions  . No [employer] Allergies     Defibrillator pads cause rash.     Procedures This Admission:  1.  Sotalol loading 2.  Direct current cardioversion on 09/23/16 by Dr Olevia Perches which successfully restored SR.  There were no early apparent complications.   Brief HPI: Karen Rasmussen is a 71 y.o. female with a past medical history as noted above.  She was referred to the AFib clinic in the outpatient setting for treatment options of atrial fibrillation.  Risks, benefits, and alternatives to AAD therapy and Sotalol were reviewed with the patient who wished to proceed.    Hospital Course:  The patient was admitted and Sotalol was initiated.  Renal function and electrolytes were followed during the hospitalization.  Her potassium required persistent repletion, her HCTZ was changed to Aldactone, BMET planned for Monday.  Her BP was borderline and her ARB stopped.  Her QTc did prolong and her dose reduced and subsequently remained stable.  On 09/23/16 they underwent direct current cardioversion which restored sinus rhythm.  She was pending a CTA of coronaries out patient, unable to get done 2/2 her AF and was done while here, result is pending, cardiology f/u has been arranged.  The patient was monitored until discharge on telemetry which demonstrated SR 60's.  On the day of discharge, she was examined by Dr Caryl Comes who considered  the patient stable for discharge to home.  Follow-up has been arranged with the AFib clinic in 1 week and with Dr Caryl Comes in 4 weeks.   Physical Exam: Vitals:   09/24/16 0700 09/24/16 0800 09/24/16 0840 09/24/16 1100  BP:   (!) 133/45   Pulse:   67   Resp: 20 18  20   Temp: 98 F (36.7 C)   97.8 F (36.6 C)  TempSrc: Oral   Oral  SpO2: 100% 100%  98%  Weight:      Height:        Labs:   Lab Results  Component Value Date   WBC 8.9 07/13/2016   HGB 13.5 07/13/2016   HCT 42.3 07/13/2016   MCV 89.2 07/13/2016   PLT 244 07/13/2016     Recent Labs Lab 09/24/16 0330  NA 138  K 4.0  CL 105  CO2 26  BUN 16  CREATININE 0.84  CALCIUM 8.8*  GLUCOSE 138*     Discharge Medications:  Allergies as of 09/24/2016      Reactions   No [employer] Allergies    Defibrillator pads cause rash.      Medication List    STOP taking these medications   hydrochlorothiazide 25 MG tablet Commonly known as:  HYDRODIURIL   losartan 25 MG tablet Commonly known as:  COZAAR     TAKE these medications   alendronate 70 MG tablet Commonly known as:  FOSAMAX Take 70 mg by mouth once a week. Take with a full glass of water  on an empty stomach.   carvedilol 3.125 MG tablet Commonly known as:  COREG Take 1 tablet (3.125 mg total) by mouth 2 (two) times daily with a meal.   CENTRUM SILVER Chew 2 soft chews by mouth daily   Glucosamine 500 MG Caps Take 1 capsule by mouth daily.   HAIR SKIN & NAILS GUMMIES PO Take by mouth. 2 gummies by mouth daily   HYDROcodone-acetaminophen 5-325 MG tablet Commonly known as:  NORCO/VICODIN Take 1-2 tablets by mouth every 6 (six) hours as needed for moderate pain.   ibuprofen 200 MG tablet Commonly known as:  ADVIL,MOTRIN Take 200 mg by mouth every 6 (six) hours as needed for headache or mild pain.   metFORMIN 500 MG tablet Commonly known as:  GLUCOPHAGE Take 500 mg by mouth daily.   potassium chloride 10 MEQ tablet Commonly known as:   K-DUR,KLOR-CON Take 2 tablets (20 mEq total) by mouth 2 (two) times daily. What changed:  medication strength   psyllium 28 % packet Commonly known as:  METAMUCIL SMOOTH TEXTURE Take 1 packet by mouth daily as needed (stomach upset).   rivaroxaban 20 MG Tabs tablet Commonly known as:  XARELTO Take 1 tablet (20 mg total) by mouth daily with supper.   simvastatin 10 MG tablet Commonly known as:  ZOCOR Take 10 mg by mouth daily.   sotalol 80 MG tablet Commonly known as:  BETAPACE Take 1 tablet (80 mg total) by mouth every 12 (twelve) hours.   spironolactone 25 MG tablet Commonly known as:  ALDACTONE Take 0.5 tablets (12.5 mg total) by mouth daily.   VITAMIN D PO Take 1 tablet by mouth daily.       Disposition:  Home Discharge Instructions    Diet - low sodium heart healthy    Complete by:  As directed    Increase activity slowly    Complete by:  As directed      Follow-up Information    [facility] Follow up on 09/30/2016.   Specialty:  Cardiology Why:  1:30PM Contact information: [address] [id number] mc [place] [phone]       Deboraha Sprang, MD Follow up on 11/02/2016.   Specialty:  Cardiology Why:  2:45PM Contact information: [address] [address] [phone]        Imogene Burn, PA-C Follow up on 10/05/2016.   Specialty:  Cardiology Why:  2:15PM  Contact information: [address] [place] [address] [phone]        [facility] Follow up on 09/27/2016.   Specialty:  Cardiology Why:  blood draw for lab.  You can go any time after 8:00AM, and before 4:00PM Contact information: [address] [place] [phone]          Duration of Discharge Encounter: Greater than 30 minutes including physician time.  Venetia Night, PA-C 09/24/2016 1:52  PM

## 2016-09-23 NOTE — Op Note (Signed)
Procedure: Electrical Cardioversion Indications:  Atrial Fibrillation  Procedure Details:  Consent: Risks of procedure as well as the alternatives and risks of each were explained to the (patient/caregiver).  Consent for procedure obtained.  Time Out: Verified patient identification, verified procedure, site/side was marked, verified correct patient position, special equipment/implants available, medications/allergies/relevent history reviewed, required imaging and test results available.  Performed  Patient placed on cardiac monitor, pulse oximetry, supplemental oxygen as necessary.  Sedation given: 80 mg IV propofol, Dr. Smith Robert Pacer pads placed anterior and posterior chest.  Cardioverted 1 time(s).  Cardioversion with synchronized biphasic 200J shock.  Evaluation: Findings: Post procedure EKG shows: NSR Complications: None Patient did tolerate procedure well.  Time Spent Directly with the Patient:  30 minutes   Karen Rasmussen 09/23/2016, 1:13 PM

## 2016-09-23 NOTE — Progress Notes (Signed)
Progress Note  Patient Name: Karen Rasmussen Date of Encounter: 09/23/2016  Primary Cardiologist: Dr. Radford Pax  Subjective   Without complaint  Tolerating without CP SOB  Hx of OI    Inpatient Medications    Scheduled Meds: . carvedilol  3.125 mg Oral BID WC  . insulin aspart  0-15 Units Subcutaneous TID WC  . losartan  25 mg Oral Daily  . metFORMIN  500 mg Oral Q breakfast  . potassium chloride  20 mEq Oral BID  . potassium chloride  30 mEq Oral Once  . rivaroxaban  20 mg Oral Q supper  . simvastatin  10 mg Oral Daily  . sotalol  80 mg Oral Q12H   Continuous Infusions:  PRN Meds: acetaminophen, HYDROcodone-acetaminophen   Vital Signs    Vitals:   09/22/16 1600 09/22/16 1959 09/23/16 0017 09/23/16 0300  BP: (!) 102/57 (!) 89/48 (!) 93/55 100/63  Pulse:  72 73 76  Resp:  20 (!) 21 19  Temp:  98.4 F (36.9 C) 98.5 F (36.9 C) 98.3 F (36.8 C)  TempSrc:      SpO2:  95% 95% 94%  Weight:    277 lb 12.8 oz (126 kg)  Height:        Intake/Output Summary (Last 24 hours) at 09/23/16 0709 Last data filed at 09/23/16 0429  Gross per 24 hour  Intake              930 ml  Output                0 ml  Net              930 ml   Filed Weights   09/21/16 1558 09/22/16 0447 09/23/16 0300  Weight: 283 lb (128.4 kg) 279 lb 1.6 oz (126.6 kg) 277 lb 12.8 oz (126 kg)    Telemetry     AFib 70-80's - Personally Reviewed  ECG    AFib, QTC by my measurement 537ms - Personally Reviewed  Physical Exam   GEN: No acute distress.   Neck: No JVD Cardiac:  IRRR, no murmurs, rubs, or gallops.  Respiratory:  CTA b/l. GI: Soft, nontender, obese  MS: No edema; No deformity. Neuro:  Nonfocal  Psych: Normal affect   Labs    Chemistry  Recent Labs Lab 09/21/16 0932 09/22/16 0722 09/23/16 0253  NA 137 138 139  K 4.3 3.7 3.6  CL 102 102 103  CO2 28 26 29   GLUCOSE 170* 149* 132*  BUN 15 13 12   CREATININE 0.92 0.80 0.86  CALCIUM 8.9 8.8* 8.9  GFRNONAA >60 >60 >60    GFRAA >60 >60 >60  ANIONGAP 7 10 7       Radiology    No results found.  Cardiac Studies   06/01/16: Lexiscan stress test  There was no ST segment deviation noted during stress.  Defect 1: There is a medium defect of moderate severity present in the basal anteroseptal, basal inferior, mid inferior and apical inferior location.  This is a low risk study.  Low risk stress nuclear study with fixed defects in the basal septum and inferior walls (attenuation vs infarct); no ischemia; study not gated due to atrial fibrillation; suggest echo to assess LV function.   05/26/16: TTE Study Conclusions - Left ventricle: The cavity size was normal. There was moderate concentric hypertrophy. Systolic function was mildly reduced. The estimated ejection fraction was in the range of 45% to 50%. Diffuse hypokinesis. - Aortic  valve: Transvalvular velocity was within the normal range. There was no stenosis. There was no regurgitation. - Mitral valve: Transvalvular velocity was within the normal range. There was no evidence for stenosis. There was no regurgitation. - Right ventricle: The cavity size was normal. Wall thickness was normal. Systolic function was normal. - Atrial septum: No defect or patent foramen ovale was identified. - Tricuspid valve: There was trivial regurgitation. - Pulmonary arteries: Systolic pressure was within the normal range. PA peak pressure: 25 mm Hg (S).  Patient Profile     71 y.o. female with a hx of HTN, presumed DCM, DM, Morbid obesity, OSA/uses the nasal pillow mask with chin strap, and PAFib who was admitted to undergo Sotalol initiation.  Assessment & Plan    1. Persistent AFib     CHA2DS2Vasc is at least 5, on xarelto, appropriately dosed for creat clearance     K+ 3.6 additional replacement ordered     Mag 2.1     Creat 0.86, stable     QTc has prolonged some, will review with dr. Caryl Comes, anticipate decreased dosing     NPO for DCCV  today, so far no room on schedule, will try to arrange  Decreased her HCTZ yesterday, despite this K+ requires ongoing replacement for sotalol.  Will hold today, she has hx of diastolic CHF and pending BP today, may plan for aldactone      2. HTN /OI       has been on the lower side here     Will stop ARB   3. DM     Home meds     She has gotten coverage by sliding scale here,   discussed need to see her PMD regarding ongoing DM management  4. 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 Radford Pax wanted done once in SR while here  5. OSA     We have discussed importance of CPAP every night, she reports "95%" compliance, some nights difficult when up/down to the BR  6. Morbid obesity     She is motivated and understands improtance of weight loss for AFib management and overall health  Hypokalemia   Signed, Baldwin Jamaica, PA-C  09/23/2016, 7:09 AM    wll plan DCCV today Will need to reassess QT in sinus rhythm  Will stop losartan with low BP  Have reviewed maneuvers for OI>>haead of bed//isometric contraction  Plan is for CTA prior to discharge  Needs cardiac rehab

## 2016-09-23 NOTE — Anesthesia Procedure Notes (Signed)
Procedure Name: MAC Date/Time: 09/23/2016 1:06 PM Performed by: Carney Living Pre-anesthesia Checklist: Patient identified, Emergency Drugs available, Suction available, Patient being monitored and Timeout performed Patient Re-evaluated:Patient Re-evaluated prior to induction Oxygen Delivery Method: Ambu bag Preoxygenation: Pre-oxygenation with 100% oxygen Induction Type: IV induction

## 2016-09-23 NOTE — Progress Notes (Signed)
ECG obtained 3 hours post 3rd administered dose of Sotalol. QTc 531. On call provider notified. Verbal order obtained to hold next dose. Will continue to monitor closely.

## 2016-09-23 NOTE — Progress Notes (Addendum)
Patient reminds me that she had a skin reaction to the defibrillation pads with her initial DCCV in May, her second DCCV in June reports was done by Dr. Acie Fredrickson who noted used the old style paddles and conductive gel (she had a rash with the adhesive on the newer defibrillation pads . Marland Kitchen   The patient tells me she did fine with this method.  I have called and made endoscopy RN aware for today's DCCV  I have asked her floor RN to add this to her allergy list  Tommye Standard, PA-C

## 2016-09-23 NOTE — Transfer of Care (Signed)
Immediate Anesthesia Transfer of Care Note  Patient: Karen Rasmussen  Procedure(s) Performed: Procedure(s): CARDIOVERSION (N/A)  Patient Location: Endoscopy Unit  Anesthesia Type:General  Level of Consciousness: awake, alert , oriented and patient cooperative  Airway & Oxygen Therapy: Patient Spontanous Breathing and Patient connected to nasal cannula oxygen  Post-op Assessment: Report given to RN, Post -op Vital signs reviewed and stable and Patient moving all extremities X 4  Post vital signs: Reviewed and stable  Last Vitals:  Vitals:   09/23/16 1100 09/23/16 1223  BP: (!) 97/52 (!) 84/59  Pulse:  86  Resp: 20 12  Temp: 36.7 C 36.4 C  SpO2: 95% 96%    Last Pain:  Vitals:   09/23/16 1223  TempSrc: Oral  PainSc: 0-No pain      Patients Stated Pain Goal: 0 (19/47/12 5271)  Complications: No apparent anesthesia complications

## 2016-09-24 ENCOUNTER — Other Ambulatory Visit: Payer: Self-pay | Admitting: Physician Assistant

## 2016-09-24 ENCOUNTER — Inpatient Hospital Stay (HOSPITAL_COMMUNITY): Payer: Medicare Other

## 2016-09-24 ENCOUNTER — Encounter (HOSPITAL_COMMUNITY): Payer: Self-pay | Admitting: Cardiovascular Disease

## 2016-09-24 DIAGNOSIS — I429 Cardiomyopathy, unspecified: Secondary | ICD-10-CM

## 2016-09-24 DIAGNOSIS — Z79899 Other long term (current) drug therapy: Secondary | ICD-10-CM

## 2016-09-24 LAB — BASIC METABOLIC PANEL
Anion gap: 7 (ref 5–15)
BUN: 16 mg/dL (ref 6–20)
CALCIUM: 8.8 mg/dL — AB (ref 8.9–10.3)
CO2: 26 mmol/L (ref 22–32)
CREATININE: 0.84 mg/dL (ref 0.44–1.00)
Chloride: 105 mmol/L (ref 101–111)
GFR calc Af Amer: >60 mL/min (ref >60)
GFR calc non Af Amer: >60 mL/min (ref >60)
GLUCOSE: 138 mg/dL — AB (ref 65–99)
Potassium: 4 mmol/L (ref 3.5–5.1)
Sodium: 138 mmol/L (ref 135–145)

## 2016-09-24 LAB — GLUCOSE, CAPILLARY
Glucose-Capillary: 142 mg/dL — ABNORMAL HIGH (ref 65–99)
Glucose-Capillary: 149 mg/dL — ABNORMAL HIGH (ref 65–99)

## 2016-09-24 LAB — MAGNESIUM: Magnesium: 2.4 mg/dL (ref 1.7–2.4)

## 2016-09-24 MED ORDER — METOPROLOL TARTRATE 5 MG/5ML IV SOLN
INTRAVENOUS | Status: AC
  Administered 2016-09-24: 2.5 mg
  Filled 2016-09-24: qty 5

## 2016-09-24 MED ORDER — SPIRONOLACTONE 25 MG PO TABS: 12.5000 mg | ORAL_TABLET | Freq: Every day | ORAL | 6 refills | Status: DC

## 2016-09-24 MED ORDER — NITROGLYCERIN 0.4 MG SL SUBL
SUBLINGUAL_TABLET | SUBLINGUAL | Status: AC
  Administered 2016-09-24: 0.08 mg
  Filled 2016-09-24: qty 1

## 2016-09-24 MED ORDER — IOPAMIDOL (ISOVUE-370) INJECTION 76%
INTRAVENOUS | Status: AC
  Administered 2016-09-24: 100 mL
  Filled 2016-09-24: qty 100

## 2016-09-24 MED ORDER — POTASSIUM CHLORIDE CRYS ER 10 MEQ PO TBCR: 20.0000 meq | EXTENDED_RELEASE_TABLET | Freq: Two times a day (BID) | ORAL | 3 refills | Status: DC

## 2016-09-24 MED ORDER — SOTALOL HCL 80 MG PO TABS: 80.0000 mg | ORAL_TABLET | Freq: Two times a day (BID) | ORAL | 6 refills | Status: DC

## 2016-09-24 NOTE — Progress Notes (Signed)
Progress Note  Patient Name: Karen Rasmussen Date of Encounter: 09/24/2016  Primary Cardiologist: Dr. Radford Pax  Subjective   Feels well this morning, no CP, palpitations, SOB  Tolerating   Inpatient Medications    Scheduled Meds: . carvedilol  3.125 mg Oral BID WC  . insulin aspart  0-15 Units Subcutaneous TID WC  . metFORMIN  500 mg Oral Q breakfast  . potassium chloride  20 mEq Oral BID  . rivaroxaban  20 mg Oral Q supper  . simvastatin  10 mg Oral Daily  . sodium chloride flush  3 mL Intravenous Q12H  . sotalol  80 mg Oral Q12H  . spironolactone  12.5 mg Oral Daily   Continuous Infusions: . sodium chloride Stopped (09/23/16 1326)  . sodium chloride     PRN Meds: acetaminophen, HYDROcodone-acetaminophen, hydrocortisone cream, sodium chloride flush   Vital Signs    Vitals:   09/23/16 1335 09/23/16 1500 09/23/16 2036 09/24/16 0405  BP: (!) 102/53 (!) 111/51 110/66 (!) 105/37  Pulse: (!) 58  60 60  Resp: 16  19 18   Temp: (!) 97.5 F (36.4 C)  98 F (36.7 C) 98.1 F (36.7 C)  TempSrc: Axillary  Oral Oral  SpO2: 94%  100% 95%  Weight:    279 lb 8 oz (126.8 kg)  Height:        Intake/Output Summary (Last 24 hours) at 09/24/16 0815 Last data filed at 09/24/16 0030  Gross per 24 hour  Intake          1691.25 ml  Output                0 ml  Net          1691.25 ml   Filed Weights   09/22/16 0447 09/23/16 0300 09/24/16 0405  Weight: 279 lb 1.6 oz (126.6 kg) 277 lb 12.8 oz (126 kg) 279 lb 8 oz (126.8 kg)    Telemetry    SR/SB, 50's-60's - Personally Reviewed  ECG    SR QTc stable from last - Personally Reviewed  Physical Exam   GEN: No acute distress.   Neck: No JVD Cardiac: RRR, no murmurs, rubs, or gallops.  Respiratory: CTA b/l. GI: Soft, nontender, obese  MS: No edema; No deformity. Neuro:  Nonfocal  Psych: Normal affect   Labs    Chemistry  Recent Labs Lab 09/22/16 0722 09/23/16 0253 09/24/16 0330  NA 138 139 138  K 3.7 3.6 4.0  CL  102 103 105  CO2 26 29 26   GLUCOSE 149* 132* 138*  BUN 13 12 16   CREATININE 0.80 0.86 0.84  CALCIUM 8.8* 8.9 8.8*  GFRNONAA >60 >60 >60  GFRAA >60 >60 >60  ANIONGAP 10 7 7       Radiology    No results found.  Cardiac Studies   06/01/16: Lexiscan stress test  There was no ST segment deviation noted during stress.  Defect 1: There is a medium defect of moderate severity present in the basal anteroseptal, basal inferior, mid inferior and apical inferior location.  This is a low risk study.  Low risk stress nuclear study with fixed defects in the basal septum and inferior walls (attenuation vs infarct); no ischemia; study not gated due to atrial fibrillation; suggest echo to assess LV function.   05/26/16: TTE Study Conclusions - Left ventricle: The cavity size was normal. There was moderate concentric hypertrophy. Systolic function was mildly reduced. The estimated ejection fraction was in the range of  45% to 50%. Diffuse hypokinesis. - Aortic valve: Transvalvular velocity was within the normal range. There was no stenosis. There was no regurgitation. - Mitral valve: Transvalvular velocity was within the normal range. There was no evidence for stenosis. There was no regurgitation. - Right ventricle: The cavity size was normal. Wall thickness was normal. Systolic function was normal. - Atrial septum: No defect or patent foramen ovale was identified. - Tricuspid valve: There was trivial regurgitation. - Pulmonary arteries: Systolic pressure was within the normal range. PA peak pressure: 25 mm Hg (S).  Patient Profile     71 y.o. female with a hx of HTN, presumed DCM, DM, Morbid obesity, OSA/uses the nasal pillow mask with chin strap, and PAFib who was admitted to undergo Sotalol initiation.  Assessment & Plan    1. Persistent AFib     CHA2DS2Vasc is at least 5, on xarelto, appropriately dosed for creat clearance     K+ 4.0     Mag 2.4     Creat 0.84      EKG stable QT on 80mg  dosing     remains in SR this AM s/p DCCV yesterday     Anticipate discharge this afternoon       2. HTN     follow with current  3. DM     Home meds  4. 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 OSA     Discussed importance of CPAP every night, she reports "95%" compliance, some nights difficult when up/down to the BR  6. Morbid obesity     She is motivated and understands improtance of weight loss for AFib management and overall health  Signed, Baldwin Jamaica, PA-C  09/24/2016, 8:15 AM    Discharge on sotalol K now improved on aldactone Will check K on Monday to decide if needs augmented supplement Has signed up for exercise and pool [facility]!!!!

## 2016-09-24 NOTE — Care Management Important Message (Signed)
Important Message  Patient Details  Name: Karen Rasmussen MRN: [id number] Date of Birth: 1945-12-31   Medicare Important Message Given:  Yes    Nathen May 09/24/2016, 10:54 AM

## 2016-09-24 NOTE — Progress Notes (Signed)
PT refused CPAP 09/23/16

## 2016-09-24 NOTE — Plan of Care (Signed)
Problem: Activity: Goal: Risk for activity intolerance will decrease Outcome: Completed/Met Date Met: 09/24/16 Patient ambulates in the room well.

## 2016-09-24 NOTE — Plan of Care (Signed)
Problem: Health Behavior/Discharge Planning: Goal: Ability to manage health-related needs will improve Outcome: Completed/Met Date Met: 09/24/16 Patient being discharged home per MD order. All discharge instructions given both verbally and in written format.

## 2016-09-27 ENCOUNTER — Other Ambulatory Visit: Payer: Medicare Other

## 2016-09-27 ENCOUNTER — Other Ambulatory Visit: Payer: Medicare Other | Admitting: *Deleted

## 2016-09-27 DIAGNOSIS — Z79899 Other long term (current) drug therapy: Secondary | ICD-10-CM

## 2016-09-27 LAB — BASIC METABOLIC PANEL
BUN/Creatinine Ratio: 18 (ref 12–28)
BUN: 15 mg/dL (ref 8–27)
CHLORIDE: 105 mmol/L (ref 96–106)
CO2: 22 mmol/L (ref 20–29)
CREATININE: 0.82 mg/dL (ref 0.57–1.00)
Calcium: 9.2 mg/dL (ref 8.7–10.3)
GFR calc Af Amer: 85 mL/min/{1.73_m2} (ref >59)
GFR calc non Af Amer: 74 mL/min/{1.73_m2} (ref >59)
GLUCOSE: 130 mg/dL — AB (ref 65–99)
Potassium: 4.4 mmol/L (ref 3.5–5.2)
SODIUM: 141 mmol/L (ref 134–144)

## 2016-09-27 NOTE — Telephone Encounter (Signed)
Patient had coronary CT done at [facility].  To Dr. Radford Pax to review.

## 2016-09-27 NOTE — Telephone Encounter (Signed)
Coronary CTA showed normal coronary arteries with a calcium score of 0

## 2016-09-27 NOTE — Telephone Encounter (Signed)
Informed patient of results and verbal understanding expressed.  

## 2016-09-30 ENCOUNTER — Ambulatory Visit (HOSPITAL_COMMUNITY)
Admit: 2016-09-30 | Discharge: 2016-09-30 | Disposition: A | Discharge status: Home / Self Care | Payer: Medicare Other | Attending: Nurse Practitioner | Admitting: Nurse Practitioner

## 2016-09-30 ENCOUNTER — Encounter (HOSPITAL_COMMUNITY): Payer: Self-pay | Admitting: Nurse Practitioner

## 2016-09-30 VITALS — BP 114/62 | HR 57 | Ht 64.0 in | Wt 278.0 lb

## 2016-09-30 DIAGNOSIS — K219 Gastro-esophageal reflux disease without esophagitis: Secondary | ICD-10-CM | POA: Insufficient documentation

## 2016-09-30 DIAGNOSIS — Z87442 Personal history of urinary calculi: Secondary | ICD-10-CM | POA: Insufficient documentation

## 2016-09-30 DIAGNOSIS — I420 Dilated cardiomyopathy: Secondary | ICD-10-CM | POA: Diagnosis not present

## 2016-09-30 DIAGNOSIS — I110 Hypertensive heart disease with heart failure: Secondary | ICD-10-CM | POA: Insufficient documentation

## 2016-09-30 DIAGNOSIS — J45909 Unspecified asthma, uncomplicated: Secondary | ICD-10-CM | POA: Insufficient documentation

## 2016-09-30 DIAGNOSIS — Z9049 Acquired absence of other specified parts of digestive tract: Secondary | ICD-10-CM | POA: Insufficient documentation

## 2016-09-30 DIAGNOSIS — E119 Type 2 diabetes mellitus without complications: Secondary | ICD-10-CM | POA: Insufficient documentation

## 2016-09-30 DIAGNOSIS — Z809 Family history of malignant neoplasm, unspecified: Secondary | ICD-10-CM | POA: Insufficient documentation

## 2016-09-30 DIAGNOSIS — E7800 Pure hypercholesterolemia, unspecified: Secondary | ICD-10-CM | POA: Insufficient documentation

## 2016-09-30 DIAGNOSIS — I481 Persistent atrial fibrillation: Secondary | ICD-10-CM | POA: Insufficient documentation

## 2016-09-30 DIAGNOSIS — M1990 Unspecified osteoarthritis, unspecified site: Secondary | ICD-10-CM | POA: Diagnosis not present

## 2016-09-30 DIAGNOSIS — Z7984 Long term (current) use of oral hypoglycemic drugs: Secondary | ICD-10-CM | POA: Diagnosis not present

## 2016-09-30 DIAGNOSIS — E559 Vitamin D deficiency, unspecified: Secondary | ICD-10-CM | POA: Diagnosis not present

## 2016-09-30 DIAGNOSIS — Z79899 Other long term (current) drug therapy: Secondary | ICD-10-CM | POA: Diagnosis not present

## 2016-09-30 DIAGNOSIS — G4733 Obstructive sleep apnea (adult) (pediatric): Secondary | ICD-10-CM | POA: Insufficient documentation

## 2016-09-30 DIAGNOSIS — I5032 Chronic diastolic (congestive) heart failure: Secondary | ICD-10-CM | POA: Insufficient documentation

## 2016-09-30 DIAGNOSIS — Z8249 Family history of ischemic heart disease and other diseases of the circulatory system: Secondary | ICD-10-CM | POA: Diagnosis not present

## 2016-09-30 DIAGNOSIS — Z7901 Long term (current) use of anticoagulants: Secondary | ICD-10-CM | POA: Insufficient documentation

## 2016-09-30 DIAGNOSIS — Z9889 Other specified postprocedural states: Secondary | ICD-10-CM | POA: Diagnosis not present

## 2016-09-30 DIAGNOSIS — Z833 Family history of diabetes mellitus: Secondary | ICD-10-CM | POA: Insufficient documentation

## 2016-09-30 DIAGNOSIS — I4819 Other persistent atrial fibrillation: Secondary | ICD-10-CM

## 2016-09-30 NOTE — Progress Notes (Signed)
Primary Care Physician: Kathyrn Lass, MD Referring Physician: Dr. Edrick Kins Karen Rasmussen is a 71 y.o. female with a h/o recent history of new onset afib in April  for which pt was placed on anticoagulation and after fully anticoagulated was scheduled for cardioversion. She developed a rash at the cardioversion pad sight and was prescribed prednisone taper. One day of starting the taper, she returned to afib. She is in the afib clinic to discuss options to restore SR. She did feel better in SR. She wished to try cardioversion one more time, hopefully not to be followed with prednisone, possible trigger.  F/u successful cardioversion 6/15. She is feeling better. She was cardioverted with paddles, not the pads that she had an allergic reaction to. No issues with cardioversion this time. She is trying to be more active and lose weight.   Returns to [place] clinic, 8/6. She presented for cardiac CT that was ordered by Dr. Radford Pax and was found to be back in AFIb. She is here to discuss antiarrythmic's. Due to reduced EF, she is not a candidate for flecainide, nor multaq. She cannot afford tikosyn, so sotalol is the most likely drug to use. She would like to plan for admission next week. She has not missed any doses of xarelto.  Return to afib clinic,8/14, for hospitalization for sotalol, cannot afford tikosyn. No missed doses of xarelto. Meds reviewed by Pharmacy and recommended that pt not take trazadone. Pt states that she is not taking and will remove form med list today. She is symptomatic with fatigue and shortness of breath with afib. QTC last week was reviewed with Dr. Rayann Heman and he felt was ok for admission. 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 Labs pending. She increased K+ last week for K+ at 3.3.  Labs back and Kt/mag at 4.3/2.3 respectively,Crcl cal at 118.18.  F/u afib clinic s/p hospitalization 8/23. She was successfully started on  sotalol 80 mg bid and is maintaining SR,  stable qtc. She has had more energy and fells well. Her bmet was checked on Monday with Kt at 4.4 mg/dl. Last mag at 2.4 mg/dl.  Today, she denies symptoms of   chest pain,  orthopnea, PND, lower extremity edema, dizziness, presyncope, syncope, or neurologic sequela.  Positive for dyspnea/fatigue.The patient is tolerating medications without difficulties and is otherwise without complaint today.   Past Medical History:  Diagnosis Date  . Abdominal pain, left lower quadrant   . Asthmatic bronchitis   . Benign essential HTN 04/26/2016  . Benign hypertensive heart disease without heart failure   . Bursitis of hip   . Chronic diastolic CHF (congestive heart failure) ([place])   . DCM (dilated cardiomyopathy) ([place])    EF 45-50%  . GERD (gastroesophageal reflux disease)   . Heart murmur   . High cholesterol   . History of kidney stones   . Hypersomnia   . Morbid obesity with BMI of 50.0-59.9, adult ([place])   . OSA on CPAP   . Osteoarthritis    "right hip; both knees" (09/21/2016)  . Persistent atrial fibrillation ([place]) 04/26/2016  . Situational depression 2003   "when I was going thru my divorce"  . Type II diabetes mellitus ([place])   . Vitamin D deficiency disease    Past Surgical History:  Procedure Laterality Date  . ABDOMINAL HYSTERECTOMY    . APPENDECTOMY    . CARDIAC CATHETERIZATION    . CARDIOVERSION N/A 06/11/2016   Procedure:  CARDIOVERSION;  Surgeon: Dorothy Spark, MD;  Location: [facility] ENDOSCOPY;  Service: Cardiovascular;  Laterality: N/A;  . CARDIOVERSION N/A 07/16/2016   Procedure: CARDIOVERSION;  Surgeon: Thayer Headings, MD;  Location: [facility] ENDOSCOPY;  Service: Cardiovascular;  Laterality: N/A;  . CARDIOVERSION N/A 09/23/2016   Procedure: CARDIOVERSION;  Surgeon: Sanda Klein, MD;  Location: [facility] ENDOSCOPY;  Service: Cardiovascular;  Laterality: N/A;  . CHOLECYSTECTOMY OPEN    . DILATION AND CURETTAGE OF UTERUS     S/P miscarriage  . NASAL SEPTUM SURGERY    . TONSILLECTOMY       Current Outpatient Prescriptions  Medication Sig Dispense Refill  . alendronate (FOSAMAX) 70 MG tablet Take 70 mg by mouth once a week. Take with a full glass of water on an empty stomach.    . Biotin w/ Vitamins C & E (HAIR SKIN & NAILS GUMMIES PO) Take by mouth. 2 gummies by mouth daily    . carvedilol (COREG) 3.125 MG tablet Take 1 tablet (3.125 mg total) by mouth 2 (two) times daily with a meal. 60 tablet 11  . Cholecalciferol (VITAMIN D PO) Take 1 tablet by mouth daily.     . Glucosamine 500 MG CAPS Take 2 capsules by mouth daily.     Marland Kitchen HYDROcodone-acetaminophen (NORCO/VICODIN) 5-325 MG tablet Take 1-2 tablets by mouth every 6 (six) hours as needed for moderate pain.    Marland Kitchen ibuprofen (ADVIL,MOTRIN) 200 MG tablet Take 200 mg by mouth every 6 (six) hours as needed for headache or mild pain.    . metFORMIN (GLUCOPHAGE) 500 MG tablet Take 500 mg by mouth daily.    . Multiple Vitamins-Minerals (CENTRUM SILVER) CHEW 2 soft chews by mouth daily    . potassium chloride (K-DUR,KLOR-CON) 10 MEQ tablet Take 2 tablets (20 mEq total) by mouth 2 (two) times daily. 120 tablet 3  . psyllium (METAMUCIL SMOOTH TEXTURE) 28 % packet Take 1 packet by mouth daily as needed (stomach upset).     . rivaroxaban (XARELTO) 20 MG TABS tablet Take 1 tablet (20 mg total) by mouth daily with supper. 90 tablet 1  . simvastatin (ZOCOR) 10 MG tablet Take 10 mg by mouth daily.    . sotalol (BETAPACE) 80 MG tablet Take 1 tablet (80 mg total) by mouth every 12 (twelve) hours. 60 tablet 6  . spironolactone (ALDACTONE) 25 MG tablet Take 0.5 tablets (12.5 mg total) by mouth daily. 15 tablet 6   No current facility-administered medications for this encounter.     Allergies  Allergen Reactions  . No [employer] Food Allergies     Defibrillator pads cause rash.    Social History   Social History  . Marital status: Divorced    Spouse name: N/A  . Number of children: N/A  . Years of education: N/A   Occupational  History  . Not on file.   Social History Main Topics  . Smoking status: Never Smoker  . Smokeless tobacco: Never Used  . Alcohol use Yes     Comment: 09/21/2016 "might have a glass of wine once/year; if that"  . Drug use: No  . Sexual activity: No   Other Topics Concern  . Not on file   Social History Narrative  . No narrative on file    Family History  Problem Relation Age of Onset  . Cancer Father   . Heart failure Father   . Diabetes Father   . CAD Mother   . Diabetes Brother   . Hypertension Brother  ROS- All systems are reviewed and negative except as per the HPI above  Physical Exam: Vitals:   09/30/16 1339  BP: 114/62  Pulse: (!) 57  Weight: 278 lb (126.1 kg)  Height: 5\' 4"  (1.626 m)   Wt Readings from Last 3 Encounters:  09/30/16 278 lb (126.1 kg)  09/24/16 279 lb 8 oz (126.8 kg)  09/21/16 282 lb (127.9 kg)    Labs: Lab Results  Component Value Date   NA 141 09/27/2016   K 4.4 09/27/2016   CL 105 09/27/2016   CO2 22 09/27/2016   GLUCOSE 130 (H) 09/27/2016   BUN 15 09/27/2016   CREATININE 0.82 09/27/2016   CALCIUM 9.2 09/27/2016   MG 2.4 09/24/2016   Lab Results  Component Value Date   INR 1.1 06/03/2016   No results found for: CHOL, HDL, LDLCALC, TRIG   GEN- The patient is well appearing, alert and oriented x 3 today.   Head- normocephalic, atraumatic Eyes-  Sclera clear, conjunctiva pink Ears- hearing intact Oropharynx- clear Neck- supple, no JVP Lymph- no cervical lymphadenopathy Lungs- Clear to ausculation bilaterally, normal work of breathing Heart- regular rate and rhythm, no murmurs, rubs or gallops, PMI not laterally displaced GI- soft, NT, ND, + BS Extremities- no clubbing, cyanosis, or edema MS- no significant deformity or atrophy Skin- no rash or lesion Psych- euthymic mood, full affect Neuro- strength and sensation are intact  EKG- sinus brady at 57 bpm, LAFB, pr int 152 ms, qrs int 88 ms, qtc 463 ms(stable) Epic  records reviewed Echo-Study Conclusions  - Left ventricle: The cavity size was normal. There was moderate   concentric hypertrophy. Systolic function was mildly reduced. The   estimated ejection fraction was in the range of 45% to 50%.   Diffuse hypokinesis. - Aortic valve: Transvalvular velocity was within the normal range.   There was no stenosis. There was no regurgitation. - Mitral valve: Transvalvular velocity was within the normal range.   There was no evidence for stenosis. There was no regurgitation. - Right ventricle: The cavity size was normal. Wall thickness was   normal. Systolic function was normal. - Atrial septum: No defect or patent foramen ovale was identified. - Tricuspid valve: There was trivial regurgitation. - Pulmonary arteries: Systolic pressure was within the normal   range. PA peak pressure: 25 mm Hg (S).    Assessment and Plan: 1. Persistent symptomatic afib Successful cardioversion 5/4 but with ERAF  Now in SR with initiation of sotalol 80 mg bid, s/p hospitalization 8/14 to 8/17 Use of sotalol discussed, precautions, benefits Continue xarelto for chadsvasc score of at least [place]. Karen Rasmussen, [facility] [address] [place], [place] [address] [phone]

## 2016-10-05 ENCOUNTER — Ambulatory Visit (INDEPENDENT_AMBULATORY_CARE_PROVIDER_SITE_OTHER): Payer: Medicare Other | Admitting: Physician Assistant

## 2016-10-05 ENCOUNTER — Encounter: Payer: Self-pay | Admitting: Physician Assistant

## 2016-10-05 VITALS — BP 130/58 | HR 59 | Ht 63.0 in | Wt 279.8 lb

## 2016-10-05 DIAGNOSIS — I5032 Chronic diastolic (congestive) heart failure: Secondary | ICD-10-CM

## 2016-10-05 DIAGNOSIS — I420 Dilated cardiomyopathy: Secondary | ICD-10-CM | POA: Diagnosis not present

## 2016-10-05 DIAGNOSIS — I4819 Other persistent atrial fibrillation: Secondary | ICD-10-CM

## 2016-10-05 DIAGNOSIS — I481 Persistent atrial fibrillation: Secondary | ICD-10-CM | POA: Diagnosis not present

## 2016-10-05 DIAGNOSIS — I10 Essential (primary) hypertension: Secondary | ICD-10-CM

## 2016-10-05 NOTE — Progress Notes (Signed)
Cardiology Office Note    Date:  10/05/2016   ID:  Karen Rasmussen, Karen Rasmussen April 29, 1945, MRN [id number]  PCP:  Kathyrn Lass, MD  Cardiologist: Dr. Radford Pax EPS Dr. Caryl Comes  Chief Complaint  Patient presents with  . Follow-up    History of Present Illness:  Karen Rasmussen is a 71 y.o. female with a hx of HTN, presumed DCM EF 45-50%, DM, Morbid obesity, OSA/uses the nasal pillow mask with chin strap, and PAFib CHADSVASC=5 on Xarelto recently discharged after hospitalization for sotalol and DCCV 09/23/16. She had low risk Lexi scan stress test 05/2016  Coronary CTA 09/24/16 calcium score 0 normal coronaries dilated main PA 31 mm compared to a aortic root 28 mm camera rule out LAA thrombus but more likely poor mixing of contrast.  Patient comes in today for follow-up. Overall she feels well. She like to start exercising and try to lose weight. She denies any palpitations or arrhythmias. Breathing has been stable.   Past Medical History:  Diagnosis Date  . Abdominal pain, left lower quadrant   . Asthmatic bronchitis   . Benign essential HTN 04/26/2016  . Benign hypertensive heart disease without heart failure   . Bursitis of hip   . Chronic diastolic CHF (congestive heart failure) ([place])   . DCM (dilated cardiomyopathy) ([place])    EF 45-50%  . GERD (gastroesophageal reflux disease)   . Heart murmur   . High cholesterol   . History of kidney stones   . Hypersomnia   . Morbid obesity with BMI of 50.0-59.9, adult ([place])   . OSA on CPAP   . Osteoarthritis    "right hip; both knees" (09/21/2016)  . Persistent atrial fibrillation ([place]) 04/26/2016  . Situational depression 2003   "when I was going thru my divorce"  . Type II diabetes mellitus ([place])   . Vitamin D deficiency disease     Past Surgical History:  Procedure Laterality Date  . ABDOMINAL HYSTERECTOMY    . APPENDECTOMY    . CARDIAC CATHETERIZATION    . CARDIOVERSION N/A 06/11/2016   Procedure: CARDIOVERSION;  Surgeon: Dorothy Spark,  MD;  Location: [facility] ENDOSCOPY;  Service: Cardiovascular;  Laterality: N/A;  . CARDIOVERSION N/A 07/16/2016   Procedure: CARDIOVERSION;  Surgeon: Thayer Headings, MD;  Location: [facility] ENDOSCOPY;  Service: Cardiovascular;  Laterality: N/A;  . CARDIOVERSION N/A 09/23/2016   Procedure: CARDIOVERSION;  Surgeon: Sanda Klein, MD;  Location: [facility] ENDOSCOPY;  Service: Cardiovascular;  Laterality: N/A;  . CHOLECYSTECTOMY OPEN    . DILATION AND CURETTAGE OF UTERUS     S/P miscarriage  . NASAL SEPTUM SURGERY    . TONSILLECTOMY      Current Medications: Current Meds  Medication Sig  . alendronate (FOSAMAX) 70 MG tablet Take 70 mg by mouth once a week. Take with a full glass of water on an empty stomach.  . Biotin w/ Vitamins C & E (HAIR SKIN & NAILS GUMMIES PO) Take by mouth. 2 gummies by mouth daily  . carvedilol (COREG) 3.125 MG tablet Take 1 tablet (3.125 mg total) by mouth 2 (two) times daily with a meal.  . Cholecalciferol (VITAMIN D PO) Take 1 tablet by mouth daily.   . Glucosamine 500 MG CAPS Take 2 capsules by mouth daily.   Marland Kitchen HYDROcodone-acetaminophen (NORCO/VICODIN) 5-325 MG tablet Take 1-2 tablets by mouth every 6 (six) hours as needed for moderate pain.  Marland Kitchen ibuprofen (ADVIL,MOTRIN) 200 MG tablet Take 200 mg by mouth every 6 (six) hours as  needed for headache or mild pain.  . metFORMIN (GLUCOPHAGE) 500 MG tablet Take 500 mg by mouth daily.  . Multiple Vitamins-Minerals (CENTRUM SILVER) CHEW 2 soft chews by mouth daily  . potassium chloride (K-DUR,KLOR-CON) 10 MEQ tablet Take 2 tablets (20 mEq total) by mouth 2 (two) times daily.  . psyllium (METAMUCIL SMOOTH TEXTURE) 28 % packet Take 1 packet by mouth daily as needed (stomach upset).   . rivaroxaban (XARELTO) 20 MG TABS tablet Take 1 tablet (20 mg total) by mouth daily with supper.  . simvastatin (ZOCOR) 10 MG tablet Take 10 mg by mouth daily.  . sotalol (BETAPACE) 80 MG tablet Take 1 tablet (80 mg total) by mouth every 12 (twelve) hours.  Marland Kitchen  spironolactone (ALDACTONE) 25 MG tablet Take 0.5 tablets (12.5 mg total) by mouth daily.     Allergies:   No healthtouch food allergies   Social History   Social History  . Marital status: Divorced    Spouse name: N/A  . Number of children: N/A  . Years of education: N/A   Social History Main Topics  . Smoking status: Never Smoker  . Smokeless tobacco: Never Used  . Alcohol use Yes     Comment: 09/21/2016 "might have a glass of wine once/year; if that"  . Drug use: No  . Sexual activity: No   Other Topics Concern  . None   Social History Narrative  . None     Family History:  The patient's family history includes CAD in her mother; Cancer in her father; Diabetes in her brother and father; Heart failure in her father; Hypertension in her brother.   ROS:   Please see the history of present illness.    Review of Systems  Constitution: Negative.  HENT: Negative.   Eyes: Negative.   Cardiovascular: Negative.   Respiratory: Negative.   Hematologic/Lymphatic: Negative.   Musculoskeletal: Positive for arthritis and joint pain.  Gastrointestinal: Negative.   Genitourinary: Negative.   Neurological: Negative.    All other systems reviewed and are negative.   PHYSICAL EXAM:   VS:  BP (!) 130/58   Pulse (!) 59   Ht 5\' 3"  (1.6 m)   Wt 279 lb 12.8 oz (126.9 kg)   BMI 49.56 kg/m   Physical Exam  GEN: Obese, in no acute distress  Neck: no JVD, carotid bruits, or masses Cardiac:RRR; 1/6 systolic murmur at the left sternal border Respiratory:  clear to auscultation bilaterally, normal work of breathing GI: soft, nontender, nondistended, + BS Ext: without cyanosis, clubbing, or edema, Good distal pulses bilaterally Neuro:  Alert and Oriented x 3 Psych: euthymic mood, full affect  Wt Readings from Last 3 Encounters:  10/05/16 279 lb 12.8 oz (126.9 kg)  09/30/16 278 lb (126.1 kg)  09/24/16 279 lb 8 oz (126.8 kg)      Studies/Labs Reviewed:   EKG:  EKG is ordered  today.  The ekg ordered today demonstrates Sinus bradycardia at 59 bpm poor R wave progression anteriorly, no acute change  Recent Labs: 05/05/2016: NT-Pro BNP 314; TSH 3.200 07/13/2016: Hemoglobin 13.5; Platelets 244 09/24/2016: Magnesium 2.4 09/27/2016: BUN 15; Creatinine, Ser 0.82; Potassium 4.4; Sodium 141   Lipid Panel No results found for: CHOL, TRIG, HDL, CHOLHDL, VLDL, LDLCALC, LDLDIRECT  Additional studies/ records that were reviewed today include:  06/01/16: Lexiscan stress test  There was no ST segment deviation noted during stress.  Defect 1: There is a medium defect of moderate severity present in the basal anteroseptal,  basal inferior, mid inferior and apical inferior location.  This is a low risk study.   Low risk stress nuclear study with fixed defects in the basal septum and inferior walls (attenuation vs infarct); no ischemia; study not gated due to atrial fibrillation; suggest echo to assess LV function.    05/26/16: TTE Study Conclusions - Left ventricle: The cavity size was normal. There was moderate   concentric hypertrophy. Systolic function was mildly reduced. The   estimated ejection fraction was in the range of 45% to 50%.   Diffuse hypokinesis. - Aortic valve: Transvalvular velocity was within the normal range.   There was no stenosis. There was no regurgitation. - Mitral valve: Transvalvular velocity was within the normal range.   There was no evidence for stenosis. There was no regurgitation. - Right ventricle: The cavity size was normal. Wall thickness was   normal. Systolic function was normal. - Atrial septum: No defect or patent foramen ovale was identified. - Tricuspid valve: There was trivial regurgitation. - Pulmonary arteries: Systolic pressure was within the normal   range. PA peak pressure: 25 mm Hg (S).   Coronary CTA 8/17/18IMPRESSION: 1) Calcium score 0   2) Normal right dominant coronary arteries   3) Dilated main PA 31 mm compared to  aortic root 28 mm   4) Cannot r/o LAA thrombus but more likely poor mixing of contrast   Jenkins Rouge     Electronically Signed   By: Jenkins Rouge M.D.   On: 09/24/2016 15:20     ASSESSMENT:    1. Persistent atrial fibrillation ([place])   2. Chronic diastolic CHF (congestive heart failure) ([place])   3. Benign essential HTN   4. DCM (dilated cardiomyopathy) ([facility])      PLAN:  In order of problems listed above:  Persistent atrial fibrillation converted to normal sinus rhythm with sotalol and DCCV. Also on Xarelto Has follow-up with Dr. Caryl Comes September.  Chronic diastolic CHF compensated. Continue low-dose Coreg  Benign essential hypertension controlled  Presumed dilated cardiomyopathy ejection fraction 45-50% with CTA calcium score of 0 and normal coronaries  Medication Adjustments/Labs and Tests Ordered: Current medicines are reviewed at length with the patient today.  Concerns regarding medicines are outlined above.  Medication changes, Labs and Tests ordered today are listed in the Patient Instructions below. There are no Patient Instructions on file for this visit.   Sumner Boast, PA-C  10/05/2016 2:38 PM    [facility] [address], [place], [place]  [address] Phone: [phone]; Fax: [phone]

## 2016-10-05 NOTE — Patient Instructions (Signed)
Medication Instructions:  Your physician recommends that you continue on your current medications as directed. Please refer to the Current Medication list given to you today.   Labwork: None  Testing/Procedures: None  Follow-Up: Your physician recommends that you keep your scheduled  follow-up appointment with Dr.Klein.   Any Other Special Instructions Will Be Listed Below (If Applicable).  Exercise is recommended.    Calorie Counting for Weight Loss Calories are units of energy. Your body needs a certain amount of calories from food to keep you going throughout the day. When you eat more calories than your body needs, your body stores the extra calories as fat. When you eat fewer calories than your body needs, your body burns fat to get the energy it needs. Calorie counting means keeping track of how many calories you eat and drink each day. Calorie counting can be helpful if you need to lose weight. If you make sure to eat fewer calories than your body needs, you should lose weight. Ask your health care provider what a healthy weight is for you. For calorie counting to work, you will need to eat the right number of calories in a day in order to lose a healthy amount of weight per week. A dietitian can help you determine how many calories you need in a day and will give you suggestions on how to reach your calorie goal.  A healthy amount of weight to lose per week is usually 1-2 lb (0.5-0.9 kg). This usually means that your daily calorie intake should be reduced by 500-750 calories.  Eating 1,200 - 1,500 calories per day can help most women lose weight.  Eating 1,500 - 1,800 calories per day can help most men lose weight.  What is my plan? My goal is to have __________ calories per day. If I have this many calories per day, I should lose around __________ pounds per week. What do I need to know about calorie counting? In order to meet your daily calorie goal, you will need  to:  Find out how many calories are in each food you would like to eat. Try to do this before you eat.  Decide how much of the food you plan to eat.  Write down what you ate and how many calories it had. Doing this is called keeping a food log.  To successfully lose weight, it is important to balance calorie counting with a healthy lifestyle that includes regular activity. Aim for 150 minutes of moderate exercise (such as walking) or 75 minutes of vigorous exercise (such as running) each week. Where do I find calorie information?  The number of calories in a food can be found on a Nutrition Facts label. If a food does not have a Nutrition Facts label, try to look up the calories online or ask your dietitian for help. Remember that calories are listed per serving. If you choose to have more than one serving of a food, you will have to multiply the calories per serving by the amount of servings you plan to eat. For example, the label on a package of bread might say that a serving size is 1 slice and that there are 90 calories in a serving. If you eat 1 slice, you will have eaten 90 calories. If you eat 2 slices, you will have eaten 180 calories. How do I keep a food log? Immediately after each meal, record the following information in your food log:  What you ate. Don't forget to  include toppings, sauces, and other extras on the food.  How much you ate. This can be measured in cups, ounces, or number of items.  How many calories each food and drink had.  The total number of calories in the meal.  Keep your food log near you, such as in a small notebook in your pocket, or use a mobile app or website. Some programs will calculate calories for you and show you how many calories you have left for the day to meet your goal. What are some calorie counting tips?  Use your calories on foods and drinks that will fill you up and not leave you hungry: ? Some examples of foods that fill you up are nuts  and nut butters, vegetables, lean proteins, and high-fiber foods like whole grains. High-fiber foods are foods with more than 5 g fiber per serving. ? Drinks such as sodas, specialty coffee drinks, alcohol, and juices have a lot of calories, yet do not fill you up.  Eat nutritious foods and avoid empty calories. Empty calories are calories you get from foods or beverages that do not have many vitamins or protein, such as candy, sweets, and soda. It is better to have a nutritious high-calorie food (such as an avocado) than a food with few nutrients (such as a bag of chips).  Know how many calories are in the foods you eat most often. This will help you calculate calorie counts faster.  Pay attention to calories in drinks. Low-calorie drinks include water and unsweetened drinks.  Pay attention to nutrition labels for "low fat" or "fat free" foods. These foods sometimes have the same amount of calories or more calories than the full fat versions. They also often have added sugar, starch, or salt, to make up for flavor that was removed with the fat.  Find a way of tracking calories that works for you. Get creative. Try different apps or programs if writing down calories does not work for you. What are some portion control tips?  Know how many calories are in a serving. This will help you know how many servings of a certain food you can have.  Use a measuring cup to measure serving sizes. You could also try weighing out portions on a kitchen scale. With time, you will be able to estimate serving sizes for some foods.  Take some time to put servings of different foods on your favorite plates, bowls, and cups so you know what a serving looks like.  Try not to eat straight from a bag or box. Doing this can lead to overeating. Put the amount you would like to eat in a cup or on a plate to make sure you are eating the right portion.  Use smaller plates, glasses, and bowls to prevent overeating.  Try  not to multitask (for example, watch TV or use your computer) while eating. If it is time to eat, sit down at a table and enjoy your food. This will help you to know when you are full. It will also help you to be aware of what you are eating and how much you are eating. What are tips for following this plan? Reading food labels  Check the calorie count compared to the serving size. The serving size may be smaller than what you are used to eating.  Check the source of the calories. Make sure the food you are eating is high in vitamins and protein and low in saturated and trans fats. Shopping  Read nutrition labels while you shop. This will help you make healthy decisions before you decide to purchase your food.  Make a grocery list and stick to it. Cooking  Try to cook your favorite foods in a healthier way. For example, try baking instead of frying.  Use low-fat dairy products. Meal planning  Use more fruits and vegetables. Half of your plate should be fruits and vegetables.  Include lean proteins like poultry and fish. How do I count calories when eating out?  Ask for smaller portion sizes.  Consider sharing an entree and sides instead of getting your own entree.  If you get your own entree, eat only half. Ask for a box at the beginning of your meal and put the rest of your entree in it so you are not tempted to eat it.  If calories are listed on the menu, choose the lower calorie options.  Choose dishes that include vegetables, fruits, whole grains, low-fat dairy products, and lean protein.  Choose items that are boiled, broiled, grilled, or steamed. Stay away from items that are buttered, battered, fried, or served with cream sauce. Items labeled "crispy" are usually fried, unless stated otherwise.  Choose water, low-fat milk, unsweetened iced tea, or other drinks without added sugar. If you want an alcoholic beverage, choose a lower calorie option such as a glass of wine or  light beer.  Ask for dressings, sauces, and syrups on the side. These are usually high in calories, so you should limit the amount you eat.  If you want a salad, choose a garden salad and ask for grilled meats. Avoid extra toppings like bacon, cheese, or fried items. Ask for the dressing on the side, or ask for olive oil and vinegar or lemon to use as dressing.  Estimate how many servings of a food you are given. For example, a serving of cooked rice is  cup or about the size of half a baseball. Knowing serving sizes will help you be aware of how much food you are eating at restaurants. The list below tells you how big or small some common portion sizes are based on everyday objects: ? 1 oz-4 stacked dice. ? 3 oz-1 deck of cards. ? 1 tsp-1 die. ? 1 Tbsp- a ping-pong ball. ? 2 Tbsp-1 ping-pong ball. ?  cup- baseball. ? 1 cup-1 baseball. Summary  Calorie counting means keeping track of how many calories you eat and drink each day. If you eat fewer calories than your body needs, you should lose weight.  A healthy amount of weight to lose per week is usually 1-2 lb (0.5-0.9 kg). This usually means reducing your daily calorie intake by 500-750 calories.  The number of calories in a food can be found on a Nutrition Facts label. If a food does not have a Nutrition Facts label, try to look up the calories online or ask your dietitian for help.  Use your calories on foods and drinks that will fill you up, and not on foods and drinks that will leave you hungry.  Use smaller plates, glasses, and bowls to prevent overeating. This information is not intended to replace advice given to you by your health care provider. Make sure you discuss any questions you have with your health care provider. Document Released: 01/25/2005 Document Revised: 12/26/2015 Document Reviewed: 12/26/2015 Elsevier Interactive Patient Education  2017 [employer].     If you need a refill on your cardiac medications  before your next appointment, please call  your pharmacy.

## 2016-10-08 NOTE — Addendum Note (Signed)
Encounter addended by: Sherran Needs, NP on: 10/08/2016  8:21 AM<BR>    Actions taken: LOS modified

## 2016-10-15 DIAGNOSIS — L300 Nummular dermatitis: Secondary | ICD-10-CM | POA: Diagnosis not present

## 2016-11-01 ENCOUNTER — Ambulatory Visit
Admission: RE | Admit: 2016-11-01 | Discharge: 2016-11-01 | Disposition: A | Discharge status: Home / Self Care | Payer: Medicare Other | Source: Ambulatory Visit | Attending: Family Medicine | Admitting: Family Medicine

## 2016-11-01 DIAGNOSIS — Z1231 Encounter for screening mammogram for malignant neoplasm of breast: Secondary | ICD-10-CM | POA: Diagnosis not present

## 2016-11-02 ENCOUNTER — Ambulatory Visit (INDEPENDENT_AMBULATORY_CARE_PROVIDER_SITE_OTHER): Payer: Medicare Other | Admitting: Internal Medicine

## 2016-11-02 ENCOUNTER — Encounter: Payer: Self-pay | Admitting: Internal Medicine

## 2016-11-02 VITALS — BP 132/64 | HR 62 | Ht 63.0 in | Wt 281.0 lb

## 2016-11-02 DIAGNOSIS — I481 Persistent atrial fibrillation: Secondary | ICD-10-CM

## 2016-11-02 DIAGNOSIS — I4819 Other persistent atrial fibrillation: Secondary | ICD-10-CM

## 2016-11-02 DIAGNOSIS — Z79899 Other long term (current) drug therapy: Secondary | ICD-10-CM | POA: Diagnosis not present

## 2016-11-02 MED ORDER — POTASSIUM CHLORIDE CRYS ER 10 MEQ PO TBCR: 20.0000 meq | EXTENDED_RELEASE_TABLET | Freq: Two times a day (BID) | ORAL | 1 refills | Status: DC

## 2016-11-02 MED ORDER — ATORVASTATIN CALCIUM 20 MG PO TABS: 20.0000 mg | ORAL_TABLET | Freq: Every day | ORAL | 1 refills | Status: DC

## 2016-11-02 MED ORDER — SOTALOL HCL 80 MG PO TABS: 80.0000 mg | ORAL_TABLET | Freq: Two times a day (BID) | ORAL | 1 refills | Status: DC

## 2016-11-02 MED ORDER — SPIRONOLACTONE 25 MG PO TABS
12.5000 mg | ORAL_TABLET | Freq: Every day | ORAL | 1 refills | Status: DC
Start: 2016-11-02 — End: 2017-05-21

## 2016-11-02 NOTE — Progress Notes (Signed)
Patient Care Team: Kathyrn Lass, MD as PCP - General (Family Medicine)   HPI  Karen Rasmussen is a 71 y.o. female Seen in follow-up for sotalol loading for atrial fibrillation.  No interval afib  toelrating anticoagulation and meds  No bleeding    Thromboembolic risk factors ( age -46, HTN-1, DM-1, CHF -1 Gender-1) for a CHADSVASc Score of 5        Past Medical History:  Diagnosis Date  . Abdominal pain, left lower quadrant   . Asthmatic bronchitis   . Benign essential HTN 04/26/2016  . Benign hypertensive heart disease without heart failure   . Bursitis of hip   . Chronic diastolic CHF (congestive heart failure) ([place])   . DCM (dilated cardiomyopathy) ([place])    EF 45-50%  . GERD (gastroesophageal reflux disease)   . Heart murmur   . High cholesterol   . History of kidney stones   . Hypersomnia   . Morbid obesity with BMI of 50.0-59.9, adult ([place])   . OSA on CPAP   . Osteoarthritis    "right hip; both knees" (09/21/2016)  . Persistent atrial fibrillation ([place]) 04/26/2016  . Situational depression 2003   "when I was going thru my divorce"  . Type II diabetes mellitus ([place])   . Vitamin D deficiency disease     Past Surgical History:  Procedure Laterality Date  . ABDOMINAL HYSTERECTOMY    . APPENDECTOMY    . CARDIAC CATHETERIZATION    . CARDIOVERSION N/A 06/11/2016   Procedure: CARDIOVERSION;  Surgeon: Dorothy Spark, MD;  Location: [facility] ENDOSCOPY;  Service: Cardiovascular;  Laterality: N/A;  . CARDIOVERSION N/A 07/16/2016   Procedure: CARDIOVERSION;  Surgeon: Thayer Headings, MD;  Location: [facility] ENDOSCOPY;  Service: Cardiovascular;  Laterality: N/A;  . CARDIOVERSION N/A 09/23/2016   Procedure: CARDIOVERSION;  Surgeon: Sanda Klein, MD;  Location: [facility] ENDOSCOPY;  Service: Cardiovascular;  Laterality: N/A;  . CHOLECYSTECTOMY OPEN    . DILATION AND CURETTAGE OF UTERUS     S/P miscarriage  . NASAL SEPTUM SURGERY    . TONSILLECTOMY      Current Outpatient  Prescriptions  Medication Sig Dispense Refill  . alendronate (FOSAMAX) 70 MG tablet Take 70 mg by mouth once a week. Take with a full glass of water on an empty stomach.    . Biotin w/ Vitamins C & E (HAIR SKIN & NAILS GUMMIES PO) Take by mouth. 2 gummies by mouth daily    . carvedilol (COREG) 3.125 MG tablet Take 1 tablet (3.125 mg total) by mouth 2 (two) times daily with a meal. 60 tablet 11  . Cholecalciferol (VITAMIN D PO) Take 1 tablet by mouth daily.     . Glucosamine 500 MG CAPS Take 2 capsules by mouth daily.     Marland Kitchen HYDROcodone-acetaminophen (NORCO/VICODIN) 5-325 MG tablet Take 1-2 tablets by mouth every 6 (six) hours as needed for moderate pain.    Marland Kitchen ibuprofen (ADVIL,MOTRIN) 200 MG tablet Take 200 mg by mouth every 6 (six) hours as needed for headache or mild pain.    . metFORMIN (GLUCOPHAGE) 500 MG tablet Take 500 mg by mouth daily.    . Multiple Vitamins-Minerals (CENTRUM SILVER) CHEW 2 soft chews by mouth daily    . potassium chloride (K-DUR,KLOR-CON) 10 MEQ tablet Take 2 tablets (20 mEq total) by mouth 2 (two) times daily. 120 tablet 3  . psyllium (METAMUCIL SMOOTH TEXTURE) 28 % packet Take 1 packet by mouth daily as needed (stomach  upset).     . rivaroxaban (XARELTO) 20 MG TABS tablet Take 1 tablet (20 mg total) by mouth daily with supper. 90 tablet 1  . simvastatin (ZOCOR) 10 MG tablet Take 10 mg by mouth daily.    . sotalol (BETAPACE) 80 MG tablet Take 1 tablet (80 mg total) by mouth every 12 (twelve) hours. 60 tablet 6  . spironolactone (ALDACTONE) 25 MG tablet Take 0.5 tablets (12.5 mg total) by mouth daily. 15 tablet 6   No current facility-administered medications for this visit.     Allergies  Allergen Reactions  . No [employer] Food Allergies     Defibrillator pads cause rash.      Review of Systems negative except from HPI and PMH  Physical Exam BP 132/64   Pulse 62   Ht 5\' 3"  (1.6 m)   Wt 281 lb (127.5 kg)   SpO2 98%   BMI 49.78 kg/m  Well developed and  nourished in no acute distress HENT normal Neck supple with JVP-flat Clear Regular rate and rhythm, no murmurs or gallops Abd-soft with active BS No Clubbing cyanosis edema Skin-warm and dry A & Oriented  Grossly normal sensory and motor function   ECG  Sinus @ 63 16/11/45   Assessment and  Plan Atrial fib  Persistent  Morbidly obese  Hypokalemia  Diabetes  Hypertension    Will check K today  Tolerating sotalol  Dose had been downtitrated 2/2 QT prolongation   On Anticoagulation;  No bleeding issues   Actively excercising   With DM will change from very low intensity statin to more intense therapy with atorv 20     Current medicines are reviewed at length with the patient today .  The patient does have concerns regarding medicines ie simvastatin

## 2016-11-02 NOTE — Patient Instructions (Addendum)
Medication Instructions: Your physician has recommended you make the following change in your medication:  -1) STOP Simvastatin (Zocor) -2) START Atorvastatin (Lipitor) 20 mg - Take 1 tablet by mouth daily .  Labwork: Your physician recommends that you have lab work today : BMET and Magnesium panel  Procedures/Testing: None Ordered  Follow-Up: Your physician wants you to keep your scheduled follow-up with Dr. Radford Pax on January 20, 2017.  If you need a refill on your cardiac medications before your next appointment, please call your pharmacy.

## 2016-11-03 ENCOUNTER — Telehealth: Payer: Self-pay | Admitting: Internal Medicine

## 2016-11-03 DIAGNOSIS — M1712 Unilateral primary osteoarthritis, left knee: Secondary | ICD-10-CM | POA: Diagnosis not present

## 2016-11-03 LAB — BASIC METABOLIC PANEL
BUN/Creatinine Ratio: 26 (ref 12–28)
BUN: 17 mg/dL (ref 8–27)
CALCIUM: 9.2 mg/dL (ref 8.7–10.3)
CO2: 25 mmol/L (ref 20–29)
CREATININE: 0.66 mg/dL (ref 0.57–1.00)
Chloride: 102 mmol/L (ref 96–106)
GFR calc Af Amer: 105 mL/min/{1.73_m2} (ref >59)
GFR, EST NON AFRICAN AMERICAN: 91 mL/min/{1.73_m2} (ref >59)
Glucose: 109 mg/dL — ABNORMAL HIGH (ref 65–99)
Potassium: 4.3 mmol/L (ref 3.5–5.2)
Sodium: 141 mmol/L (ref 134–144)

## 2016-11-03 LAB — MAGNESIUM: Magnesium: 2.2 mg/dL (ref 1.6–2.3)

## 2016-11-03 NOTE — Telephone Encounter (Signed)
She can take whichever statin she and Drt TT prefer

## 2016-11-03 NOTE — Telephone Encounter (Signed)
New message    Pt c/o medication issue:  1. Name of Medication: lipitor   2. How are you currently taking this medication (dosage and times per day)? Once daily  3. Are you having a reaction (difficulty breathing--STAT)? No   4. What is your medication issue? Pt states that she talked to the pharmacy about her medication. She said there is a conflict with the lipitor and she wants to know if she can stay on the zocor

## 2016-11-03 NOTE — Telephone Encounter (Signed)
Pt is calling to inform Dr Caryl Comes that she prefers going back to her old regimen of simvastatin 10 mg po daily vs starting atorvastatin.  Pt states that she was switched from zocor to lipitor yesterday, when she saw Dr Caryl Comes in the office.  Pt states that she would like to change the administration time of  simvastatin to bedtime, before changing over to a different statin.  Pt is requesting to discontinue the atorvastatin order from yesterday, and start back her old regimen of simvastatin.  Pt states she never picked up the atorvastatin from the pharmacy.  Pt reports she will continue her simvastatin until she see's Dr Radford Pax in Dec for follow-up.  Pt reports if at that time Dr Radford Pax agrees for her to stop simvastatin all together and start a different statin like lipitor, she will then make the change at that time.  Informed the pt that both Nira Conn and Dr Caryl Comes are out of the office today, but I will route this message to them for further review and recommendation.  Informed the pt that someone will follow-up with her once recommendations received.  Pt verbalized understanding and agrees with this plan.  Will make the final med change in the system, once Dr Caryl Comes reviews and advises on statin preference.

## 2016-11-04 NOTE — Telephone Encounter (Signed)
Cancelled Rx for Atorvastatin at [facility] as requested by patient

## 2016-11-04 NOTE — Telephone Encounter (Signed)
I left a message for the patient to call. 

## 2016-11-04 NOTE — Telephone Encounter (Signed)
She has a calcium score of 0 so I would like her PCP to manage her hyperlipidemia

## 2016-11-04 NOTE — Telephone Encounter (Signed)
Pt returning our call °Please call back ° °

## 2016-11-04 NOTE — Telephone Encounter (Signed)
I spoke with the patient and she is aware she may stay on the simvastatin 10 mg once daily. She states she has been taking this late afternoon instead of in the evening, so she will start to take this in the evening. She is aware I will send this message to Dr. Radford Pax and if she has any further recommendations prior to seeing her in December, we can call her back with those. Per the patient, she has 2 #90 bottles of the simvastatin 10 mg, so if Dr. Radford Pax wants to up her to 20 mg a day, she has plenty.  She is aware she will receive a call back if Dr. Radford Pax wants to make any changes prior to December, otherwise she will remain on simvastatin 10 mg once daily

## 2016-11-04 NOTE — Telephone Encounter (Signed)
Advised patient

## 2016-11-08 ENCOUNTER — Telehealth: Payer: Self-pay

## 2016-11-08 NOTE — Telephone Encounter (Signed)
lmtcb for results. 

## 2016-11-08 NOTE — Telephone Encounter (Signed)
Pt is aware and agreeable to results. She was grateful for the call

## 2016-11-11 DIAGNOSIS — Z23 Encounter for immunization: Secondary | ICD-10-CM | POA: Diagnosis not present

## 2016-11-12 ENCOUNTER — Telehealth: Payer: Self-pay | Admitting: Physician Assistant

## 2016-11-12 NOTE — Telephone Encounter (Signed)
Follow up    Patient calling back regarding sample for Xarelto.

## 2016-11-12 NOTE — Telephone Encounter (Signed)
New message     Patient calling the office for samples of medication:   1.  What medication and dosage are you requesting samples for? Xarelto 20mg   2.  Are you currently out of this medication?  No , but is in the donut hole and can not afford to get this medication, $440

## 2016-11-15 ENCOUNTER — Telehealth: Payer: Self-pay

## 2016-11-15 NOTE — Telephone Encounter (Signed)
New message     Patient calling the office for samples of medication:   1.  What medication and dosage are you requesting samples for? Xarelto 20 mg  2.  Are you currently out of this medication? no

## 2016-11-15 NOTE — Telephone Encounter (Signed)
I did a tier exception through covermymeds and received an immediate response of:  The patient currently has access to the requested medication through her insurance and a tier exception is denied because Xarelto is already at the lowest cost for a brand named medication.  The pt states that she cannot afford Xarelto as she is in the donut hole. She earns too much money to qualify for pt assistances through [employer]. Tier exception denied as noted above.  Please advise.

## 2016-11-15 NOTE — Telephone Encounter (Signed)
ROUTED TO REFILLS

## 2016-11-15 NOTE — Telephone Encounter (Addendum)
The pt states that she has contacted [employer] Patient assistance program and was advised that she makes to much money to qualify for pt assistance. Per the pt she was advised that if you make more than $13 thousand per year you cannot get pt assistance through [employer].  I have offered to do a tier exception on her Xarelto to hopefully get the cost lowered for her.  She is advised that the office cannot supply her with enough samples until she is out of the donut hole. Also, she is aware that if we cannot get a tier exception and she cannot afford her Xarelto we will talk with Dr Radford Pax or Dr Caryl Comes about switching her to a medication she can afford.

## 2016-11-16 ENCOUNTER — Telehealth: Payer: Self-pay | Admitting: Cardiology

## 2016-11-16 NOTE — Telephone Encounter (Signed)
Please forward to [facility] for recs

## 2016-11-16 NOTE — Telephone Encounter (Signed)
Options include switching pt to warfarin or sampling her with Xarelto until the end of the year. She will have a ~$400 deductible at the beginning of next year though so it would likely be ~4 months or so before her Xarelto cost returns to normal. Typically do not like to provide samples for extended periods of time.

## 2016-11-16 NOTE — Telephone Encounter (Signed)
New message   Pt is calling about her medication. She said she spoke w/ someone earlier.

## 2016-11-16 NOTE — Telephone Encounter (Signed)
Called patient back. Patient stated she wants to keep taking xarelto. Informed patient that she is not guaranteed samples. Patient verbalized understanding.

## 2016-11-16 NOTE — Telephone Encounter (Signed)
Please find out what patient wants to do 

## 2016-11-16 NOTE — Telephone Encounter (Signed)
Pt asking about getting Tier exception for Xarelto for 2019. Pt advised we would be unable to try to get Tier exception for 2019 until 2019, it would be denied by insurance if we applied for Tier exception for 2019 now.  Pt advised [employer] could help her with Tier exception in 2019, she thanked me for call.

## 2016-11-17 DIAGNOSIS — M1712 Unilateral primary osteoarthritis, left knee: Secondary | ICD-10-CM | POA: Diagnosis not present

## 2016-11-24 DIAGNOSIS — M1712 Unilateral primary osteoarthritis, left knee: Secondary | ICD-10-CM | POA: Diagnosis not present

## 2016-12-01 DIAGNOSIS — M1712 Unilateral primary osteoarthritis, left knee: Secondary | ICD-10-CM | POA: Diagnosis not present

## 2016-12-09 ENCOUNTER — Other Ambulatory Visit: Payer: Self-pay | Admitting: Physician Assistant

## 2016-12-09 NOTE — Telephone Encounter (Signed)
Xarelto 20mg  refill received; pt is 71 yrs old, wt-127.5kg, Crea-0.66 11/02/16, last seen by Dr. Caryl Comes on 11/02/16, CrCl-164.23ml/min; will send in refill request.

## 2016-12-23 ENCOUNTER — Telehealth: Payer: Self-pay | Admitting: Cardiology

## 2016-12-23 NOTE — Telephone Encounter (Signed)
Samples left at front desk  pt is aware ./cy

## 2016-12-23 NOTE — Telephone Encounter (Signed)
Patient calling the office for samples of medication:   1.  What medication and dosage are you requesting samples for? 2 bottles of Xarelto 20 mg  2.  Are you currently out of this medication?

## 2017-01-06 DIAGNOSIS — Z86010 Personal history of colonic polyps: Secondary | ICD-10-CM | POA: Diagnosis not present

## 2017-01-06 DIAGNOSIS — R197 Diarrhea, unspecified: Secondary | ICD-10-CM | POA: Diagnosis not present

## 2017-01-14 DIAGNOSIS — S90861A Insect bite (nonvenomous), right foot, initial encounter: Secondary | ICD-10-CM | POA: Diagnosis not present

## 2017-01-14 DIAGNOSIS — L299 Pruritus, unspecified: Secondary | ICD-10-CM | POA: Diagnosis not present

## 2017-01-14 DIAGNOSIS — W57XXXA Bitten or stung by nonvenomous insect and other nonvenomous arthropods, initial encounter: Secondary | ICD-10-CM | POA: Diagnosis not present

## 2017-01-19 ENCOUNTER — Telehealth: Payer: Self-pay | Admitting: Cardiology

## 2017-01-19 NOTE — Telephone Encounter (Signed)
New message  ° ° °Patient calling the office for samples of medication: ° ° °1.  What medication and dosage are you requesting samples for? XARELTO 20 MG TABS tablet ° °2.  Are you currently out of this medication? Yes  ° ° °

## 2017-01-19 NOTE — Telephone Encounter (Signed)
Spoke with patient and she is requesting one bottle of samples. She is aware that I will place this at the front desk.

## 2017-01-20 ENCOUNTER — Ambulatory Visit: Payer: Medicare Other | Admitting: Cardiology

## 2017-01-28 ENCOUNTER — Telehealth: Payer: Self-pay | Admitting: *Deleted

## 2017-01-28 NOTE — Telephone Encounter (Signed)
Patient is in the donut hole and she is requesting one more bottle of xarelto samples. Samples provided to patient.

## 2017-02-09 ENCOUNTER — Other Ambulatory Visit: Payer: Self-pay

## 2017-02-09 MED ORDER — RIVAROXABAN 20 MG PO TABS: ORAL_TABLET | ORAL | 2 refills | Status: DC

## 2017-02-09 NOTE — Telephone Encounter (Signed)
Pt last saw Dr Caryl Comes 11/02/16, last labs 11/02/16 Creat 0.66, age 73, weight 127.5kg, CrCl 161.92, based on CrCl pt is on appropriate dosage of Xarelto 20mg  QD, will refill rx.

## 2017-02-23 DIAGNOSIS — E7800 Pure hypercholesterolemia, unspecified: Secondary | ICD-10-CM | POA: Diagnosis not present

## 2017-02-23 DIAGNOSIS — M810 Age-related osteoporosis without current pathological fracture: Secondary | ICD-10-CM | POA: Diagnosis not present

## 2017-02-23 DIAGNOSIS — E119 Type 2 diabetes mellitus without complications: Secondary | ICD-10-CM | POA: Diagnosis not present

## 2017-03-28 ENCOUNTER — Ambulatory Visit: Payer: Medicare Other | Admitting: Cardiology

## 2017-03-31 ENCOUNTER — Encounter: Payer: Self-pay | Admitting: Cardiology

## 2017-03-31 ENCOUNTER — Telehealth: Payer: Self-pay | Admitting: Cardiology

## 2017-03-31 ENCOUNTER — Ambulatory Visit (INDEPENDENT_AMBULATORY_CARE_PROVIDER_SITE_OTHER): Payer: Medicare Other | Admitting: Cardiology

## 2017-03-31 VITALS — BP 148/80 | HR 66 | Ht 63.0 in | Wt 282.0 lb

## 2017-03-31 DIAGNOSIS — I420 Dilated cardiomyopathy: Secondary | ICD-10-CM

## 2017-03-31 DIAGNOSIS — I10 Essential (primary) hypertension: Secondary | ICD-10-CM

## 2017-03-31 DIAGNOSIS — G4733 Obstructive sleep apnea (adult) (pediatric): Secondary | ICD-10-CM

## 2017-03-31 DIAGNOSIS — I481 Persistent atrial fibrillation: Secondary | ICD-10-CM

## 2017-03-31 DIAGNOSIS — Z6841 Body Mass Index (BMI) 40.0 and over, adult: Secondary | ICD-10-CM

## 2017-03-31 DIAGNOSIS — I4819 Other persistent atrial fibrillation: Secondary | ICD-10-CM

## 2017-03-31 DIAGNOSIS — I5032 Chronic diastolic (congestive) heart failure: Secondary | ICD-10-CM

## 2017-03-31 MED ORDER — CARVEDILOL 3.125 MG PO TABS: 3.1250 mg | ORAL_TABLET | Freq: Two times a day (BID) | ORAL | 11 refills | Status: DC

## 2017-03-31 NOTE — Patient Instructions (Addendum)
Medication Instructions:  Your physician recommends that you continue on your current medications as directed. Please refer to the Current Medication list given to you today.  Check with your pharmacy for the cost of eliquis 5 mg two times a day and call the office.   If you need a refill on your cardiac medications, please contact your pharmacy first.  Labwork: Today for kidney function test and complete blood count   Testing/Procedures: Your physician has requested that you regularly monitor and record your blood pressure readings at home. Please use the same machine at the same time of day to check your readings and record them to bring to your follow-up visit.   Follow-Up: Your physician wants you to follow-up in: 6 months with PA. You will receive a reminder letter in the mail two months in advance. If you don't receive a letter, please call our office to schedule the follow-up appointment.  Your physician wants you to follow-up in: 1 year with Dr. Radford Pax. You will receive a reminder letter in the mail two months in advance. If you don't receive a letter, please call our office to schedule the follow-up appointment.  Any Other Special Instructions Will Be Listed Below (If Applicable).   Thank you for choosing [facility], RN  [phone]  If you need a refill on your cardiac medications before your next appointment, please call your pharmacy.

## 2017-03-31 NOTE — Telephone Encounter (Signed)
New message   Pt c/o medication issue:  1. Name of Medication: Xarelto  2. How are you currently taking this medication (dosage and times per day)? TAKE 1 TABLET DAILY WITH SUPPER  3. Are you having a reaction (difficulty breathing--STAT)? no  4. What is your medication issue? Pt says that she checked the price of the Eliquis and Xarelto with [facility] and the price is the same so she wants to let the doctor know that she will stay taking the Xarelto.

## 2017-03-31 NOTE — Telephone Encounter (Signed)
Dr. Radford Pax made aware that patient will stay in [place].

## 2017-03-31 NOTE — Progress Notes (Signed)
Cardiology Office Note:    Date:  03/31/2017   ID:  Karen Rasmussen, DOB 05/22/1945, MRN [id number]  PCP:  Kathyrn Lass, MD  Cardiologist:  No primary care provider on file.    Referring MD: Kathyrn Lass, MD   Chief Complaint  Patient presents with  . Atrial Fibrillation  . Hypertension  . Cardiomyopathy  . Congestive Heart Failure    History of Present Illness:    Karen Rasmussen is a 72 y.o. female with a hx of HTN, presumed DCM EF 45-50%, DM, Morbid obesity, OSA on CPAP, and PAFib with CHADSVASC=5 on Xarelto s/p Sotolol load and  DCCV 09/23/16. She had low risk Lexi scan stress test 05/2016 and coronary CTA 09/24/16 showed calcium score 0 and normal coronaries .    She is here today for followup and is doing well.  She denies any chest pain or pressure,  PND, orthopnea,  dizziness, palpitations or syncope. She has occasional LE edema which is stable and likely related to morbid obesity.  She has chronic DOE which is very stable and is from deconditioning. She is compliant with her meds and is tolerating meds with no SE.  She is doing well with her CPAP device.  She tolerates the mask and feels the pressure is adequate.  Since going on CPAP she feels rested in the am and has no significant daytime sleepiness.  She does not use it on occasion when she gets diarrhea or when she is having orthopedic problems with a lot of leg spasms.  She denies any significant mouth or nasal dryness or nasal congestion.  She does not think that he snores.  She as joined [employer] to try to start exercising.  Past Medical History:  Diagnosis Date  . Abdominal pain, left lower quadrant   . Asthmatic bronchitis   . Benign essential HTN 04/26/2016  . Benign hypertensive heart disease without heart failure   . Bursitis of hip   . Chronic diastolic CHF (congestive heart failure) ([place])   . DCM (dilated cardiomyopathy) ([place])    EF 45-50%  . GERD (gastroesophageal reflux disease)   . Heart murmur   . High  cholesterol   . History of kidney stones   . Hypersomnia   . Morbid obesity with BMI of 50.0-59.9, adult ([place])   . OSA on CPAP   . Osteoarthritis    "right hip; both knees" (09/21/2016)  . Persistent atrial fibrillation ([place]) 04/26/2016  . Situational depression 2003   "when I was going thru my divorce"  . Type II diabetes mellitus ([place])   . Vitamin D deficiency disease     Past Surgical History:  Procedure Laterality Date  . ABDOMINAL HYSTERECTOMY    . APPENDECTOMY    . CARDIAC CATHETERIZATION    . CARDIOVERSION N/A 06/11/2016   Procedure: CARDIOVERSION;  Surgeon: Dorothy Spark, MD;  Location[facility] ENDOSCOPY;  Service: Cardiovascular;  Laterality: N/A;  . CARDIOVERSION N/A 07/16/2016   Procedure: CARDIOVERSION;  Surgeon: Thayer Headings, MD;  Location: [facility] ENDOSCOPY;  Service: Cardiovascular;  Laterality: N/A;  . CARDIOVERSION N/A 09/23/2016   Procedure: CARDIOVERSION;  Surgeon: Sanda Klein, MD;  Location: [facility] ENDOSCOPY;  Service: Cardiovascular;  Laterality: N/A;  . CHOLECYSTECTOMY OPEN    . DILATION AND CURETTAGE OF UTERUS     S/P miscarriage  . NASAL SEPTUM SURGERY    . TONSILLECTOMY      Current Medications: Current Meds  Medication Sig  . alendronate (FOSAMAX) 70 MG tablet  Take 70 mg by mouth once a week. Take with a full glass of water on an empty stomach.  . Biotin w/ Vitamins C & E (HAIR SKIN & NAILS GUMMIES PO) Take by mouth. 2 gummies by mouth daily  . carvedilol (COREG) 3.125 MG tablet Take 1 tablet (3.125 mg total) by mouth 2 (two) times daily with a meal.  . Cholecalciferol (VITAMIN D PO) Take 1 tablet by mouth daily.   Marland Kitchen glimepiride (AMARYL) 4 MG tablet Take 4 mg by mouth 2 (two) times daily.  . Glucosamine 500 MG CAPS Take 2 capsules by mouth daily.   Marland Kitchen HYDROcodone-acetaminophen (NORCO/VICODIN) 5-325 MG tablet Take 1-2 tablets by mouth every 6 (six) hours as needed for moderate pain.  Marland Kitchen ibuprofen (ADVIL,MOTRIN) 200 MG tablet Take 200 mg by mouth every 6 (six)  hours as needed for headache or mild pain.  . metFORMIN (GLUCOPHAGE) 500 MG tablet Take 500 mg by mouth daily.  . Multiple Vitamins-Minerals (CENTRUM SILVER) CHEW 2 soft chews by mouth daily  . potassium chloride (K-DUR,KLOR-CON) 10 MEQ tablet Take 2 tablets (20 mEq total) by mouth 2 (two) times daily.  . psyllium (METAMUCIL SMOOTH TEXTURE) 28 % packet Take 1 packet by mouth daily as needed (stomach upset).   . rivaroxaban (XARELTO) 20 MG TABS tablet TAKE 1 TABLET DAILY WITH   SUPPER  . simvastatin (ZOCOR) 10 MG tablet Take 10 mg by mouth at bedtime.  . sotalol (BETAPACE) 80 MG tablet Take 1 tablet (80 mg total) by mouth every 12 (twelve) hours.  Marland Kitchen spironolactone (ALDACTONE) 25 MG tablet Take 0.5 tablets (12.5 mg total) by mouth daily.  Marland Kitchen tiZANidine (ZANAFLEX) 2 MG tablet Take 25 mg by mouth as needed. 1 every 8-12 hours for spasm     Allergies:   No healthtouch food allergies   Social History   Socioeconomic History  . Marital status: Divorced    Spouse name: None  . Number of children: None  . Years of education: None  . Highest education level: None  Social Needs  . Financial resource strain: None  . Food insecurity - worry: None  . Food insecurity - inability: None  . Transportation needs - medical: None  . Transportation needs - non-medical: None  Occupational History  . None  Tobacco Use  . Smoking status: Never Smoker  . Smokeless tobacco: Never Used  Substance and Sexual Activity  . Alcohol use: Yes    Comment: 09/21/2016 "might have a glass of wine once/year; if that"  . Drug use: No  . Sexual activity: No  Other Topics Concern  . None  Social History Narrative  . None     Family History: The patient's family history includes CAD in her mother; Cancer in her father; Diabetes in her brother and father; Heart failure in her father; Hypertension in her brother. There is no history of Breast cancer.  ROS:   Please see the history of present illness.    ROS  All  other systems reviewed and negative.   EKGs/Labs/Other Studies Reviewed:    The following studies were reviewed today: CPAP download  EKG:  EKG is ordered today and showed NSR at 66bpm with LAFB with LVH by voltage and QTc 48msec  Recent Labs: 05/05/2016: NT-Pro BNP 314; TSH 3.200 07/13/2016: Hemoglobin 13.5; Platelets 244 11/02/2016: BUN 17; Creatinine, Ser 0.66; Magnesium 2.2; Potassium 4.3; Sodium 141   Recent Lipid Panel No results found for: CHOL, TRIG, HDL, CHOLHDL, VLDL, LDLCALC, LDLDIRECT  Physical  Exam:    VS:  BP (!) 148/80   Pulse 66   Ht 5\' 3"  (1.6 m)   Wt 282 lb (127.9 kg)   SpO2 97%   BMI 49.95 kg/m     Wt Readings from Last 3 Encounters:  03/31/17 282 lb (127.9 kg)  11/02/16 281 lb (127.5 kg)  10/05/16 279 lb 12.8 oz (126.9 kg)     GEN:  Well nourished, well developed in no acute distress HEENT: Normal NECK: No JVD; No carotid bruits LYMPHATICS: No lymphadenopathy CARDIAC: RRR, no murmurs, rubs, gallops RESPIRATORY:  Clear to auscultation without rales, wheezing or rhonchi  ABDOMEN: Soft, non-tender, non-distended MUSCULOSKELETAL:  No edema; No deformity  SKIN: Warm and dry NEUROLOGIC:  Alert and oriented x 3 PSYCHIATRIC:  Normal affect   ASSESSMENT:    1. DCM (dilated cardiomyopathy) ([place])   2. Chronic diastolic CHF (congestive heart failure) ([place])   3. Benign essential HTN   4. Persistent atrial fibrillation ([facility])   5. OSA (obstructive sleep apnea)   6. Morbid obesity with BMI of 40.0-44.9, adult ([place])    PLAN:    In order of problems listed above:  1.  DCM - EF 45-50% by echo 05/2016 with normal coronary arteries by coronary CTA 09/2016.  2.  Chronic diastolic CHF - she appears euvolemic on exam today.  Her weight is stable.  She will continue on spironolactone.  She has not required Lasix.   3.  HTN - BP is borderline controlled on exam today.  She will continue on spironolactone 12.5mg  daily and Carvedilol 3.125mg  BID.  I will check a 24  hour BP monitor.   4.  Persistent atrial fibrillation - she is maintaining NSR s/p Sotolol load and DCCV 09/2016.  She will continue on Sotolol 80mg  BID, carvedilol 3.125mg  BID and Xarelto 20mg  daily.  I will check a BMET and CBC.    5.  OSA - the patient is tolerating PAP therapy well without any problems. The PAP download was reviewed today and showed an AHI of 1.1/hr on auto CPAP with 70% compliance in using more than 4 hours nightly.  The patient has been using and benefiting from PAP use and will continue to benefit from therapy.   6.  Morbid obesity - she is joining [employer].  Medication Adjustments/Labs and Tests Ordered: Current medicines are reviewed at length with the patient today.  Concerns regarding medicines are outlined above.  No orders of the defined types were placed in this encounter.  No orders of the defined types were placed in this encounter.   Signed, Fransico Him, MD  03/31/2017 12:03 PM    [place]

## 2017-04-01 LAB — CBC
HEMATOCRIT: 41.2 % (ref 34.0–46.6)
Hemoglobin: 13.7 g/dL (ref 11.1–15.9)
MCH: 29.3 pg (ref 26.6–33.0)
MCHC: 33.3 g/dL (ref 31.5–35.7)
MCV: 88 fL (ref 79–97)
Platelets: 267 10*3/uL (ref 150–379)
RBC: 4.68 x10E6/uL (ref 3.77–5.28)
RDW: 14.2 % (ref 12.3–15.4)
WBC: 11.2 10*3/uL — AB (ref 3.4–10.8)

## 2017-04-01 LAB — BASIC METABOLIC PANEL
BUN / CREAT RATIO: 14 (ref 12–28)
BUN: 11 mg/dL (ref 8–27)
CO2: 22 mmol/L (ref 20–29)
CREATININE: 0.8 mg/dL (ref 0.57–1.00)
Calcium: 10 mg/dL (ref 8.7–10.3)
Chloride: 103 mmol/L (ref 96–106)
GFR calc Af Amer: 87 mL/min/{1.73_m2} (ref >59)
GFR, EST NON AFRICAN AMERICAN: 75 mL/min/{1.73_m2} (ref >59)
GLUCOSE: 176 mg/dL — AB (ref 65–99)
Potassium: 4.6 mmol/L (ref 3.5–5.2)
Sodium: 143 mmol/L (ref 134–144)

## 2017-04-04 NOTE — Addendum Note (Signed)
Addended by: Patterson Hammersmith A on: 04/04/2017 11:23 AM   Modules accepted: Orders

## 2017-04-11 ENCOUNTER — Telehealth: Payer: Self-pay | Admitting: Cardiology

## 2017-04-11 NOTE — Telephone Encounter (Signed)
Returned call to patient. Patient stated she would like to know if its okay to take triamcinolone a topical steriod she takes as needed for itching or bug bites. Informed patient due to being a topical and only taking as needed no risk identified per [place] drug look up(up to date) resource. She verbalized understanding and thanked me for the call

## 2017-04-11 NOTE — Telephone Encounter (Signed)
New message   Pt c/o medication issue:  1. Name of Medication: TRIAMCINOLONE  2. How are you currently taking this medication (dosage and times per day)? 0.5  3. Are you having a reaction (difficulty breathing--STAT)? NO  4. What is your medication issue? Is there anything strong to take that will not interfere with heart medications

## 2017-04-21 ENCOUNTER — Ambulatory Visit (INDEPENDENT_AMBULATORY_CARE_PROVIDER_SITE_OTHER): Payer: Medicare Other

## 2017-04-21 ENCOUNTER — Encounter: Payer: Self-pay | Admitting: *Deleted

## 2017-04-21 DIAGNOSIS — I10 Essential (primary) hypertension: Secondary | ICD-10-CM

## 2017-04-21 NOTE — Progress Notes (Signed)
Patient ID: Karen Rasmussen, female   DOB: 02/26/1945, 72 y.o.   MRN: [id number] 24 hour ambulatory blood pressure monitor applied to patient using adult large cuff.

## 2017-04-22 ENCOUNTER — Telehealth: Payer: Self-pay | Admitting: Cardiology

## 2017-04-22 NOTE — Telephone Encounter (Signed)
Patient calling and states that she wore a 24-hr BP monitor and has now taken it off. She states that she can't bring it back until Monday. She states that the monitor keeps going off and wants to know if there is anyway to stop this. Discussed with Katie in monitors. Made patient aware that there is no way to turn it off without losing the information. Patient verbalized understanding. Patient states that she is going to try and bring it back today before the office closes.

## 2017-04-22 NOTE — Telephone Encounter (Signed)
Patient calling, would like a call from the nurse. Patient would not go into details in regards to reason for call

## 2017-04-27 DIAGNOSIS — Z7984 Long term (current) use of oral hypoglycemic drugs: Secondary | ICD-10-CM | POA: Diagnosis not present

## 2017-04-27 DIAGNOSIS — E119 Type 2 diabetes mellitus without complications: Secondary | ICD-10-CM | POA: Diagnosis not present

## 2017-05-02 ENCOUNTER — Telehealth: Payer: Self-pay

## 2017-05-02 DIAGNOSIS — E119 Type 2 diabetes mellitus without complications: Secondary | ICD-10-CM | POA: Diagnosis not present

## 2017-05-02 NOTE — Telephone Encounter (Signed)
Patient made aware of blood pressure monitor results. Per Dr. Radford Pax good BP no change in therapy. Pt verbalized understanding and thankful for the call.

## 2017-05-09 ENCOUNTER — Other Ambulatory Visit: Payer: Self-pay | Admitting: *Deleted

## 2017-05-09 MED ORDER — SOTALOL HCL 80 MG PO TABS: 80.0000 mg | ORAL_TABLET | Freq: Two times a day (BID) | ORAL | 1 refills | Status: DC

## 2017-05-09 NOTE — Telephone Encounter (Signed)
Patient called and stated that she saw Dr Radford Pax in February but refills were not sent in for her medications. She is requesting that Dr Radford Pax refill the sotalol. She states that this medication was prescribed by Dr Caryl Comes but she is not going to see him for follow up. Okay to refill? Please advise. Thanks, MI

## 2017-05-20 ENCOUNTER — Other Ambulatory Visit: Payer: Self-pay | Admitting: Internal Medicine

## 2017-05-21 ENCOUNTER — Other Ambulatory Visit: Payer: Self-pay | Admitting: Internal Medicine

## 2017-05-23 ENCOUNTER — Other Ambulatory Visit: Payer: Self-pay | Admitting: *Deleted

## 2017-05-23 MED ORDER — POTASSIUM CHLORIDE CRYS ER 10 MEQ PO TBCR: EXTENDED_RELEASE_TABLET | ORAL | 0 refills | Status: DC

## 2017-05-24 ENCOUNTER — Other Ambulatory Visit: Payer: Self-pay | Admitting: Internal Medicine

## 2017-05-26 ENCOUNTER — Other Ambulatory Visit: Payer: Self-pay | Admitting: Cardiology

## 2017-05-26 MED ORDER — CARVEDILOL 3.125 MG PO TABS: 3.1250 mg | ORAL_TABLET | Freq: Two times a day (BID) | ORAL | 3 refills | Status: DC

## 2017-06-15 DIAGNOSIS — M1712 Unilateral primary osteoarthritis, left knee: Secondary | ICD-10-CM | POA: Diagnosis not present

## 2017-06-15 DIAGNOSIS — M1711 Unilateral primary osteoarthritis, right knee: Secondary | ICD-10-CM | POA: Diagnosis not present

## 2017-06-17 DIAGNOSIS — D1801 Hemangioma of skin and subcutaneous tissue: Secondary | ICD-10-CM | POA: Diagnosis not present

## 2017-06-17 DIAGNOSIS — L820 Inflamed seborrheic keratosis: Secondary | ICD-10-CM | POA: Diagnosis not present

## 2017-06-17 DIAGNOSIS — L918 Other hypertrophic disorders of the skin: Secondary | ICD-10-CM | POA: Diagnosis not present

## 2017-06-17 DIAGNOSIS — D225 Melanocytic nevi of trunk: Secondary | ICD-10-CM | POA: Diagnosis not present

## 2017-06-17 DIAGNOSIS — L570 Actinic keratosis: Secondary | ICD-10-CM | POA: Diagnosis not present

## 2017-06-17 DIAGNOSIS — D2272 Melanocytic nevi of left lower limb, including hip: Secondary | ICD-10-CM | POA: Diagnosis not present

## 2017-06-17 DIAGNOSIS — L821 Other seborrheic keratosis: Secondary | ICD-10-CM | POA: Diagnosis not present

## 2017-07-18 DIAGNOSIS — H2513 Age-related nuclear cataract, bilateral: Secondary | ICD-10-CM | POA: Diagnosis not present

## 2017-07-18 DIAGNOSIS — Z7984 Long term (current) use of oral hypoglycemic drugs: Secondary | ICD-10-CM | POA: Diagnosis not present

## 2017-07-18 DIAGNOSIS — H524 Presbyopia: Secondary | ICD-10-CM | POA: Diagnosis not present

## 2017-07-18 DIAGNOSIS — H52223 Regular astigmatism, bilateral: Secondary | ICD-10-CM | POA: Diagnosis not present

## 2017-07-18 DIAGNOSIS — H5203 Hypermetropia, bilateral: Secondary | ICD-10-CM | POA: Diagnosis not present

## 2017-07-18 DIAGNOSIS — E119 Type 2 diabetes mellitus without complications: Secondary | ICD-10-CM | POA: Diagnosis not present

## 2017-07-27 DIAGNOSIS — M1711 Unilateral primary osteoarthritis, right knee: Secondary | ICD-10-CM | POA: Diagnosis not present

## 2017-07-27 DIAGNOSIS — M1712 Unilateral primary osteoarthritis, left knee: Secondary | ICD-10-CM | POA: Diagnosis not present

## 2017-08-03 DIAGNOSIS — M1711 Unilateral primary osteoarthritis, right knee: Secondary | ICD-10-CM | POA: Diagnosis not present

## 2017-08-03 DIAGNOSIS — M1712 Unilateral primary osteoarthritis, left knee: Secondary | ICD-10-CM | POA: Diagnosis not present

## 2017-08-24 DIAGNOSIS — M810 Age-related osteoporosis without current pathological fracture: Secondary | ICD-10-CM | POA: Diagnosis not present

## 2017-08-24 DIAGNOSIS — Z0000 Encounter for general adult medical examination without abnormal findings: Secondary | ICD-10-CM | POA: Diagnosis not present

## 2017-08-24 DIAGNOSIS — Z6841 Body Mass Index (BMI) 40.0 and over, adult: Secondary | ICD-10-CM | POA: Diagnosis not present

## 2017-08-24 DIAGNOSIS — Z7984 Long term (current) use of oral hypoglycemic drugs: Secondary | ICD-10-CM | POA: Diagnosis not present

## 2017-08-24 DIAGNOSIS — R197 Diarrhea, unspecified: Secondary | ICD-10-CM | POA: Diagnosis not present

## 2017-08-24 DIAGNOSIS — E1169 Type 2 diabetes mellitus with other specified complication: Secondary | ICD-10-CM | POA: Diagnosis not present

## 2017-09-07 ENCOUNTER — Other Ambulatory Visit: Payer: Self-pay | Admitting: Family Medicine

## 2017-09-07 DIAGNOSIS — Z1231 Encounter for screening mammogram for malignant neoplasm of breast: Secondary | ICD-10-CM

## 2017-09-21 ENCOUNTER — Other Ambulatory Visit: Payer: Self-pay | Admitting: Gastroenterology

## 2017-09-21 DIAGNOSIS — R1310 Dysphagia, unspecified: Secondary | ICD-10-CM | POA: Diagnosis not present

## 2017-09-21 DIAGNOSIS — Z86010 Personal history of colonic polyps: Secondary | ICD-10-CM | POA: Diagnosis not present

## 2017-09-26 ENCOUNTER — Telehealth: Payer: Self-pay

## 2017-09-26 NOTE — Telephone Encounter (Signed)
   Primary Cardiologist:Traci Turner, MD  Chart reviewed as part of pre-operative protocol coverage. Last seen 03/2017 with recommendation to f/u in 6 months (09/2017). Do not see this scheduled.  Pre-op covering staff: - Please schedule appointment and call patient to inform them. Procedure is scheduled for 10/10 so late September might work for appt availability's sake unless patient has sooner concerns. - Please contact requesting surgeon's office via preferred method (i.e, phone, fax) to inform them of need for appointment prior to surgery.  Charlie Pitter, PA-C  09/26/2017, 3:56 PM

## 2017-09-26 NOTE — Telephone Encounter (Signed)
   [facility] Pre-operative Risk Assessment    Request for surgical clearance:  1. What type of surgery is being performed? Colonoscopy and Endoscopy    2. When is this surgery scheduled? 11/17/17   3. What type of clearance is required (medical clearance vs. Pharmacy clearance to hold med vs. Both)? Medical and Pharmacy   4. Are there any medications that need to be held prior to surgery and how long? Xarelto    5. Practice name and name of physician performing surgery? [facility], Dr. Therisa Doyne    6. What is your office phone number [phone]    7.   What is your office fax number [phone]  8.   Anesthesia type (None, local, MAC, general) ? Unspecified    Sarina Ill 09/26/2017, 2:27 PM  _________________________________________________________________   (provider comments below)

## 2017-09-26 NOTE — Telephone Encounter (Signed)
Called pt re: surgical clearance. Left detailed message that pt needs to schedule an appt before she can be cleared and to call back to make an apt.

## 2017-09-28 DIAGNOSIS — M810 Age-related osteoporosis without current pathological fracture: Secondary | ICD-10-CM | POA: Diagnosis not present

## 2017-09-28 NOTE — Telephone Encounter (Signed)
Pt is scheduled to see Ermalinda Barrios on 11/02/17, I have notified requesting office of appointment and assured them that clearance will be addressed at visit

## 2017-10-19 DIAGNOSIS — M1711 Unilateral primary osteoarthritis, right knee: Secondary | ICD-10-CM | POA: Diagnosis not present

## 2017-10-19 DIAGNOSIS — M1712 Unilateral primary osteoarthritis, left knee: Secondary | ICD-10-CM | POA: Diagnosis not present

## 2017-10-23 ENCOUNTER — Other Ambulatory Visit: Payer: Self-pay | Admitting: Cardiology

## 2017-10-23 ENCOUNTER — Other Ambulatory Visit: Payer: Self-pay | Admitting: Internal Medicine

## 2017-10-25 ENCOUNTER — Telehealth: Payer: Self-pay | Admitting: Cardiology

## 2017-10-25 ENCOUNTER — Telehealth: Payer: Self-pay

## 2017-10-25 NOTE — Telephone Encounter (Signed)
Pt returned my call and she will wait for her appt 11/02/17 to have both surgical clearances done. Pt has already cancelled her Cortisone injection.

## 2017-10-25 NOTE — Telephone Encounter (Signed)
Spoke to Karen Rasmussen) and sent through a surgical clearance for procedure.

## 2017-10-25 NOTE — Telephone Encounter (Signed)
New Message         Ebony Hail with [facility] is asking if they can give patient a cortisone shot of about 1cc per knee. They would like to know if this is ok. Pls call and advise .[phone]. They would like schedule her one day this week.

## 2017-10-25 NOTE — Telephone Encounter (Signed)
Left message for pt to call back re: Cortisone Injection. Pt needs to r/s that and have her appt for all surgical clearances at appt 11/02/17.

## 2017-10-25 NOTE — Telephone Encounter (Signed)
   [facility] Pre-operative Risk Assessment    Request for surgical clearance:  1. What type of surgery is being performed?  Cortisone shot 1 ml bilateral knees   2. When is this surgery scheduled?  10/27/17   3. What type of clearance is required (medical clearance vs. Pharmacy clearance to hold med vs. Both)? Both  4. Are there any medications that need to be held prior to surgery and how long? Xarelto   5. Practice name and name of physician performing surgery?   [facility]  6. What is your office phone number [phone]    7.   What is your office fax number [phone]  8.   Anesthesia type (None, local, MAC, general) ?  local   Karen Rasmussen 10/25/2017, 10:48 AM  _________________________________________________________________   (provider comments below)

## 2017-10-25 NOTE — Telephone Encounter (Signed)
Left message for Karen Rasmussen) from [place] to return call.

## 2017-10-25 NOTE — Telephone Encounter (Signed)
Noted that patient has an appointment next week for separate clearance that was  called for - see phone note from 09/26/2017.   Would advise holding on injection and to keep appointment next week and discuss ALL clearances and then proceed as directed.   Please call patient and ask her to keep her appointment and let the requesting provider know as well.   Burtis Junes, RN, [place] [address] [place], [place]  [phone]

## 2017-10-26 DIAGNOSIS — Z23 Encounter for immunization: Secondary | ICD-10-CM | POA: Diagnosis not present

## 2017-11-02 ENCOUNTER — Telehealth: Payer: Self-pay | Admitting: *Deleted

## 2017-11-02 ENCOUNTER — Ambulatory Visit (INDEPENDENT_AMBULATORY_CARE_PROVIDER_SITE_OTHER): Payer: Medicare Other | Admitting: Physician Assistant

## 2017-11-02 ENCOUNTER — Encounter: Payer: Self-pay | Admitting: Physician Assistant

## 2017-11-02 VITALS — BP 110/70 | HR 61 | Ht 62.0 in | Wt 284.0 lb

## 2017-11-02 DIAGNOSIS — I420 Dilated cardiomyopathy: Secondary | ICD-10-CM | POA: Diagnosis not present

## 2017-11-02 DIAGNOSIS — I5032 Chronic diastolic (congestive) heart failure: Secondary | ICD-10-CM

## 2017-11-02 DIAGNOSIS — Z01818 Encounter for other preprocedural examination: Secondary | ICD-10-CM | POA: Diagnosis not present

## 2017-11-02 DIAGNOSIS — I481 Persistent atrial fibrillation: Secondary | ICD-10-CM | POA: Diagnosis not present

## 2017-11-02 DIAGNOSIS — I10 Essential (primary) hypertension: Secondary | ICD-10-CM | POA: Diagnosis not present

## 2017-11-02 DIAGNOSIS — I4819 Other persistent atrial fibrillation: Secondary | ICD-10-CM

## 2017-11-02 NOTE — Progress Notes (Signed)
Can you let patient know that Dr. Radford Pax said she could have her steroid injection.

## 2017-11-02 NOTE — Telephone Encounter (Signed)
Pt has been made aware that she has been cleared to have her Steroid Injection. Will fax this correspondence to the requesting provider. Pt thanked me for the call.

## 2017-11-02 NOTE — Progress Notes (Signed)
Cardiology Office Note    Date:  11/02/2017   ID:  Haely, Leyland Feb 05, 1946, MRN [id number]  PCP:  Kathyrn Lass, MD  Cardiologist: Fransico Him, MD EPS: None  Chief Complaint  Patient presents with  . Pre-op Exam    History of Present Illness:  Karen Rasmussen is a 72 y.o. female with history of dilated cardiomyopathy LVEF 45 to 50%, hypertension, DM, morbid obesity, PAF CHA2DS2-VASc equals 5 on Xarelto status post sotalol load and cardioversion 09/23/2016.  Low risk Lexiscan 05/2016, coronary CTA 09/24/2016 calcium score of 0 normal coronaries.  Patient last saw Dr. Radford Pax 03/31/2017 at which time she was well compensated.  Patient comes in today for preop clearance for colonoscopy/endoscopy as well as steroid injections into knee.  24-hour blood pressure monitor showed good BP control.Denies chest pain, palpitations, dyspnea, edema, dizziness or presyncope. Can't do much because of chronic knee problems. Wondering if she can have a steroid injection in her knee b/c of QT prolongation.   Past Medical History:  Diagnosis Date  . Abdominal pain, left lower quadrant   . Asthmatic bronchitis   . Benign essential HTN 04/26/2016  . Benign hypertensive heart disease without heart failure   . Bursitis of hip   . Chronic diastolic CHF (congestive heart failure) ([place])   . DCM (dilated cardiomyopathy) ([place])    EF 45-50%  . GERD (gastroesophageal reflux disease)   . Heart murmur   . High cholesterol   . History of kidney stones   . Hypersomnia   . Morbid obesity with BMI of 50.0-59.9, adult ([place])   . OSA on CPAP   . Osteoarthritis    "right hip; both knees" (09/21/2016)  . Persistent atrial fibrillation ([place]) 04/26/2016  . Situational depression 2003   "when I was going thru my divorce"  . Type II diabetes mellitus ([place])   . Vitamin D deficiency disease     Past Surgical History:  Procedure Laterality Date  . ABDOMINAL HYSTERECTOMY    . APPENDECTOMY    . CARDIAC  CATHETERIZATION    . CARDIOVERSION N/A 06/11/2016   Procedure: CARDIOVERSION;  Surgeon: Dorothy Spark, MD;  Location: [facility] ENDOSCOPY;  Service: Cardiovascular;  Laterality: N/A;  . CARDIOVERSION N/A 07/16/2016   Procedure: CARDIOVERSION;  Surgeon: Thayer Headings, MD;  Location: [facility] ENDOSCOPY;  Service: Cardiovascular;  Laterality: N/A;  . CARDIOVERSION N/A 09/23/2016   Procedure: CARDIOVERSION;  Surgeon: Sanda Klein, MD;  Location: [facility] ENDOSCOPY;  Service: Cardiovascular;  Laterality: N/A;  . CHOLECYSTECTOMY OPEN    . DILATION AND CURETTAGE OF UTERUS     S/P miscarriage  . NASAL SEPTUM SURGERY    . TONSILLECTOMY      Current Medications: Current Meds  Medication Sig  . alendronate (FOSAMAX) 70 MG tablet Take 70 mg by mouth once a week. Take with a full glass of water on an empty stomach.  . Biotin w/ Vitamins C & E (HAIR SKIN & NAILS GUMMIES PO) Take by mouth. 2 gummies by mouth daily  . Calcium Carbonate (CALCIUM 600 HIGH POTENCY PO) Take 600 mg by mouth daily.  . carvedilol (COREG) 3.125 MG tablet Take 1 tablet (3.125 mg total) by mouth 2 (two) times daily with a meal.  . Cholecalciferol (VITAMIN D PO) Take 1 tablet by mouth daily.   Marland Kitchen glimepiride (AMARYL) 4 MG tablet Take 4 mg by mouth 2 (two) times daily.  . Glucosamine 500 MG CAPS Take 2 capsules by mouth daily.   Marland Kitchen  HYDROcodone-acetaminophen (NORCO/VICODIN) 5-325 MG tablet Take 1-2 tablets by mouth every 6 (six) hours as needed for moderate pain.  Marland Kitchen ibuprofen (ADVIL,MOTRIN) 200 MG tablet Take 200 mg by mouth every 6 (six) hours as needed for headache or mild pain.  . metFORMIN (GLUCOPHAGE) 500 MG tablet Take 500 mg by mouth 2 (two) times daily with a meal.   . Multiple Vitamins-Minerals (CENTRUM SILVER) CHEW 2 soft chews by mouth daily  . potassium chloride (K-DUR,KLOR-CON) 10 MEQ tablet TAKE 2 TABLETS BY MOUTH TWICE DAILY  . psyllium (METAMUCIL SMOOTH TEXTURE) 28 % packet Take 1 packet by mouth daily as needed (stomach upset).     . simvastatin (ZOCOR) 10 MG tablet Take 10 mg by mouth at bedtime.  . sotalol (BETAPACE) 80 MG tablet TAKE 1 TABLET EVERY 12     HOURS  . spironolactone (ALDACTONE) 25 MG tablet TAKE 1/2 TABLET (12.5MG     TOTAL) DAILY  . tiZANidine (ZANAFLEX) 2 MG tablet Take 25 mg by mouth as needed. 1 every 8-12 hours for spasm  . XARELTO 20 MG TABS tablet TAKE 1 TABLET DAILY WITH   SUPPER     Allergies:   No healthtouch food allergies   Social History   Socioeconomic History  . Marital status: Divorced    Spouse name: Not on file  . Number of children: Not on file  . Years of education: Not on file  . Highest education level: Not on file  Occupational History  . Not on file  Social Needs  . Financial resource strain: Not on file  . Food insecurity:    Worry: Not on file    Inability: Not on file  . Transportation needs:    Medical: Not on file    Non-medical: Not on file  Tobacco Use  . Smoking status: Never Smoker  . Smokeless tobacco: Never Used  Substance and Sexual Activity  . Alcohol use: Yes    Comment: 09/21/2016 "might have a glass of wine once/year; if that"  . Drug use: No  . Sexual activity: Never  Lifestyle  . Physical activity:    Days per week: Not on file    Minutes per session: Not on file  . Stress: Not on file  Relationships  . Social connections:    Talks on phone: Not on file    Gets together: Not on file    Attends religious service: Not on file    Active member of club or organization: Not on file    Attends meetings of clubs or organizations: Not on file    Relationship status: Not on file  Other Topics Concern  . Not on file  Social History Narrative  . Not on file     Family History:  The patient's family history includes CAD in her mother; Cancer in her father; Diabetes in her brother and father; Heart failure in her father; Hypertension in her brother.   ROS:   Please see the history of present illness.    Review of Systems  Cardiovascular:  Positive for dyspnea on exertion.  Musculoskeletal: Positive for arthritis, back pain, joint pain and joint swelling.  Neurological: Positive for weakness.   All other systems reviewed and are negative.   PHYSICAL EXAM:   VS:  BP 110/70   Pulse 61   Ht 5\' 2"  (1.575 m)   Wt 284 lb (128.8 kg)   BMI 51.94 kg/m   Physical Exam  GEN: Obese, in no acute distress  Neck: no  JVD, carotid bruits, or masses Cardiac:RRR; no murmurs, rubs, or gallops  Respiratory:  clear to auscultation bilaterally, normal work of breathing GI: soft, nontender, nondistended, + BS Ext: without cyanosis, clubbing, or edema, Good distal pulses bilaterally Neuro:  Alert and Oriented x 3 Psych: euthymic mood, full affect  Wt Readings from Last 3 Encounters:  11/02/17 284 lb (128.8 kg)  03/31/17 282 lb (127.9 kg)  11/02/16 281 lb (127.5 kg)      Studies/Labs Reviewed:   EKG:  EKG is  ordered today.  The ekg ordered today demonstrates normal sinus rhythm with LVH left anterior fascicular block, no acute change  Recent Labs: 11/02/2016: Magnesium 2.2 03/31/2017: BUN 11; Creatinine, Ser 0.80; Hemoglobin 13.7; Platelets 267; Potassium 4.6; Sodium 143   Lipid Panel No results found for: CHOL, TRIG, HDL, CHOLHDL, VLDL, LDLCALC, LDLDIRECT  Additional studies/ records that were reviewed today include:   The echo 4/2018Study Conclusions   - Left ventricle: The cavity size was normal. There was moderate   concentric hypertrophy. Systolic function was mildly reduced. The   estimated ejection fraction was in the range of 45% to 50%.   Diffuse hypokinesis. - Aortic valve: Transvalvular velocity was within the normal range.   There was no stenosis. There was no regurgitation. - Mitral valve: Transvalvular velocity was within the normal range.   There was no evidence for stenosis. There was no regurgitation. - Right ventricle: The cavity size was normal. Wall thickness was   normal. Systolic function was normal. -  Atrial septum: No defect or patent foramen ovale was identified. - Tricuspid valve: There was trivial regurgitation. - Pulmonary arteries: Systolic pressure was within the normal   range. PA peak pressure: 25 mm Hg (S).   Nuclear stress test 4/2018Study Highlights      There was no ST segment deviation noted during stress.  Defect 1: There is a medium defect of moderate severity present in the basal anteroseptal, basal inferior, mid inferior and apical inferior location.  This is a low risk study.   Low risk stress nuclear study with fixed defects in the basal septum and inferior walls (attenuation vs infarct); no ischemia; study not gated due to atrial fibrillation; suggest echo to assess LV function.      Coronary CTA 8/2018FINDINGS: Non-cardiac: See separate report from [facility]. No significant findings on limited lung and soft tissue windows.   Calcium Score: 0   Aortic root 2.8 cm Dilated MPA 31 mm with LPA 28 mm and RPA 28 mm Cannot r/o thrombus in tip of LA appendage but likely poor contrast mixing Delayed appendage images not performed   As study was done to r/o CAD   Coronary Arteries: Right dominant with no anomalies   LM: Normal   LAD:  Normal   D1: Normal   D2: Normal   Circumflex: Normal   OM1: Normal   OM2:  Normal   RCA:  Normal dominant   PDA:  Normal   PLA:  Normal   IMPRESSION: 1) Calcium score 0   2) Normal right dominant coronary arteries   3) Dilated main PA 31 mm compared to aortic root 28 mm   4) Cannot r/o LAA thrombus but more likely poor mixing of contrast   Jenkins Rouge     Electronically Signed   By: Jenkins Rouge M.D.   On: 09/24/2016 15:20      ASSESSMENT:    1. Preoperative clearance   2. DCM (dilated cardiomyopathy) ([place])  3. Chronic diastolic CHF (congestive heart failure) ([place])   4. Benign essential HTN   5. Persistent atrial fibrillation ([facility])      PLAN:  In order of problems listed  above:  Preoperative clearance before undergoing colonoscopy/endoscopy and steroid injection.  Patient has had no symptoms of heart failure.  She has a poor functional capacity because of her chronic knee pain but she does have a METS over 4.  According to the revised cardiac risks index she does not require any further cardiac testing before undergoing colonoscopy.  Discussed with Claiborne Billings and pharmacy who recommends that she can hold her Xarelto 1 to 2 days prior to colonoscopy/endoscopy resume after procedure.  She cannot have steroid injections until she is cleared by Dr. Radford Pax.  She has been told in the past not to have this because of QT prolongation.  She is on sotalol and had prolonged QT on higher doses but QT is normal today. According to the Revised Cardiac Risk Index (RCRI), her Perioperative Risk of Major Cardiac Event is (%): 0.9  Her Functional Capacity in METs is: 4.06 according to the [employer] Activity Status Index (DASI).    Dilated cardiomyopathy ejection fraction 45 to 50% on echo 05/2016 calcium score 0 and normal coronaries on coronary CT 09/2016 compensated  Chronic diastolic CHF compensated without evidence of heart failure  Essential hypertension blood pressure well controlled  Persistent atrial fibrillation on sotalol and Eliquis status post DCCV 09/2016 chads vas score equals 5.  Maintaining normal sinus rhythm.    Medication Adjustments/Labs and Tests Ordered: Current medicines are reviewed at length with the patient today.  Concerns regarding medicines are outlined above.  Medication changes, Labs and Tests ordered today are listed in the Patient Instructions below. Patient Instructions  Medication Instructions:  Your physician recommends that you continue on your current medications as directed. Please refer to the Current Medication list given to you today.   Labwork: None ordered  Testing/Procedures: None ordered  Follow-Up: Your physician recommends that you  schedule a follow-up appointment in:    Any Other Special Instructions Will Be Listed Below (If Applicable).     If you need a refill on your cardiac medications before your next appointment, please call your pharmacy.      Signed, Ermalinda Barrios, PA-C  11/02/2017 1:31 PM    [facility] [address], [place], [place]  [address] Phone: [phone]; Fax: [phone]

## 2017-11-02 NOTE — Telephone Encounter (Signed)
Returned pts call and advised her that due to her medical history, she is at a high risk for stroke and it is our recommendation that she only hold her Xarelto 2 days prior to her Colonoscopy and that she will need to contact her surgeon for their instructions. Pt thanked me for the call.

## 2017-11-02 NOTE — Telephone Encounter (Signed)
PT CALLED WITH CONCERNS ABOUT TIMEFRAME OF HOLDING  XARELTO FOR MORE THAN  2 DAY AS INSTRUCTED BY MICHELE  LENZE VISIT TODAY.  PT PROVIDER NURSE WHO DOING PROCEDURE GAVE HER A CALLED STATING  XARELTO NEEDS TO BE HELD 3 DAYS NOT  TWO.  PT HAS CONCERNS WITH TWO DIFFERENT  XARELTO INSTRUCTIONS AND  WANT TO KNOW IF THAT'S OKAY WITH LENZE

## 2017-11-02 NOTE — Patient Instructions (Addendum)
Medication Instructions:  Your physician recommends that you continue on your current medications as directed. Please refer to the Current Medication list given to you today.   Labwork: None ordered  Testing/Procedures: None ordered  Follow-Up: Your physician wants you to follow-up in: [place] DR. Mallie Snooks will receive a reminder letter in the mail two months in advance. If you don't receive a letter, please call our office to schedule the follow-up appointment.    Any Other Special Instructions Will Be Listed Below (If Applicable). You may hold your Xarelto 1-2 days prior to your Colonoscopy. You have been cleared for surgery. Karen Rasmussen!    If you need a refill on your cardiac medications before your next appointment, please call your pharmacy.

## 2017-11-02 NOTE — Telephone Encounter (Signed)
-----   Message from Imogene Burn, PA-C sent at 11/02/2017  3:54 PM EDT -----   ----- Message ----- From: [name], MD Sent: 11/02/2017   1:52 PM EDT To: Imogene Burn, PA-C  That is fine  Traci ----- Message ----- From: Imogene Burn, PA-C Sent: 11/02/2017   1:51 PM EDT To: [name], MD  Dr. Radford Pax, this patient has been told not to have steroid injections in her knee in the past because of possible QT prolongation.  She is on sotalol and had QT prolongation on higher doses but it is not prolonged today.  Want to know if you would approve her for steroid injection?

## 2017-11-03 ENCOUNTER — Ambulatory Visit
Admission: RE | Admit: 2017-11-03 | Discharge: 2017-11-03 | Disposition: A | Discharge status: Home / Self Care | Payer: Medicare Other | Source: Ambulatory Visit | Attending: Family Medicine | Admitting: Family Medicine

## 2017-11-03 DIAGNOSIS — Z1231 Encounter for screening mammogram for malignant neoplasm of breast: Secondary | ICD-10-CM

## 2017-11-03 HISTORY — PX: OTHER SURGICAL HISTORY: SHX169

## 2017-11-04 ENCOUNTER — Telehealth: Payer: Self-pay | Admitting: Cardiology

## 2017-11-04 NOTE — Telephone Encounter (Signed)
Left message for pt to let her know that I have refaxed her surgical clearance over for her Steroid Injection.

## 2017-11-04 NOTE — Telephone Encounter (Signed)
Patient wants a prescription for pain meds for her knee pain that won't interact with her heart meds

## 2017-11-04 NOTE — Telephone Encounter (Signed)
Patient called and stated the ortho dr still needs a clearance for steroid injection

## 2017-11-04 NOTE — Telephone Encounter (Signed)
Spoke with the patient she is having a cortisone shot and Dr. Harrie Jeans requested that Dr. Radford Pax determine a pain medication to help with her inflammation that would not interfere with her cardiac medications.   Sending to Dr. Radford Pax for recommendations.

## 2017-11-06 NOTE — Telephone Encounter (Signed)
She needs to avoid NSAIDS - pain meds need to be left to PCP

## 2017-11-07 DIAGNOSIS — M1712 Unilateral primary osteoarthritis, left knee: Secondary | ICD-10-CM | POA: Diagnosis not present

## 2017-11-07 DIAGNOSIS — M1711 Unilateral primary osteoarthritis, right knee: Secondary | ICD-10-CM | POA: Diagnosis not present

## 2017-11-07 NOTE — Telephone Encounter (Signed)
Spoke with the patient about her pain management, Dr. Radford Pax advised to avoid NSAIDs. She also asked about her surgical clearance for the steroid injection and it was faxed on 9/25. Patient expressed understanding.

## 2017-11-15 ENCOUNTER — Other Ambulatory Visit: Payer: Self-pay

## 2017-11-15 ENCOUNTER — Encounter (HOSPITAL_COMMUNITY): Payer: Self-pay | Admitting: *Deleted

## 2017-11-17 ENCOUNTER — Other Ambulatory Visit: Payer: Self-pay

## 2017-11-17 ENCOUNTER — Ambulatory Visit (HOSPITAL_COMMUNITY)
Admission: RE | Admit: 2017-11-17 | Discharge: 2017-11-17 | Disposition: A | Discharge status: Home / Self Care | Payer: Medicare Other | Source: Ambulatory Visit | Attending: Gastroenterology | Admitting: Gastroenterology

## 2017-11-17 ENCOUNTER — Encounter (HOSPITAL_COMMUNITY)
Admission: RE | Disposition: A | Discharge status: Home / Self Care | Payer: Self-pay | Source: Ambulatory Visit | Attending: Gastroenterology

## 2017-11-17 ENCOUNTER — Encounter (HOSPITAL_COMMUNITY): Payer: Self-pay | Admitting: *Deleted

## 2017-11-17 ENCOUNTER — Ambulatory Visit (HOSPITAL_COMMUNITY): Payer: Medicare Other | Admitting: Anesthesiology

## 2017-11-17 DIAGNOSIS — I4819 Other persistent atrial fibrillation: Secondary | ICD-10-CM | POA: Diagnosis not present

## 2017-11-17 DIAGNOSIS — E559 Vitamin D deficiency, unspecified: Secondary | ICD-10-CM | POA: Insufficient documentation

## 2017-11-17 DIAGNOSIS — D123 Benign neoplasm of transverse colon: Secondary | ICD-10-CM | POA: Insufficient documentation

## 2017-11-17 DIAGNOSIS — Z86010 Personal history of colonic polyps: Secondary | ICD-10-CM | POA: Diagnosis not present

## 2017-11-17 DIAGNOSIS — G4733 Obstructive sleep apnea (adult) (pediatric): Secondary | ICD-10-CM | POA: Diagnosis not present

## 2017-11-17 DIAGNOSIS — K449 Diaphragmatic hernia without obstruction or gangrene: Secondary | ICD-10-CM | POA: Diagnosis not present

## 2017-11-17 DIAGNOSIS — I420 Dilated cardiomyopathy: Secondary | ICD-10-CM | POA: Diagnosis not present

## 2017-11-17 DIAGNOSIS — D122 Benign neoplasm of ascending colon: Secondary | ICD-10-CM | POA: Insufficient documentation

## 2017-11-17 DIAGNOSIS — R011 Cardiac murmur, unspecified: Secondary | ICD-10-CM | POA: Insufficient documentation

## 2017-11-17 DIAGNOSIS — K3189 Other diseases of stomach and duodenum: Secondary | ICD-10-CM | POA: Diagnosis not present

## 2017-11-17 DIAGNOSIS — K222 Esophageal obstruction: Secondary | ICD-10-CM | POA: Diagnosis not present

## 2017-11-17 DIAGNOSIS — K2980 Duodenitis without bleeding: Secondary | ICD-10-CM | POA: Insufficient documentation

## 2017-11-17 DIAGNOSIS — E7800 Pure hypercholesterolemia, unspecified: Secondary | ICD-10-CM | POA: Insufficient documentation

## 2017-11-17 DIAGNOSIS — M1611 Unilateral primary osteoarthritis, right hip: Secondary | ICD-10-CM | POA: Diagnosis not present

## 2017-11-17 DIAGNOSIS — Z8249 Family history of ischemic heart disease and other diseases of the circulatory system: Secondary | ICD-10-CM | POA: Insufficient documentation

## 2017-11-17 DIAGNOSIS — I5032 Chronic diastolic (congestive) heart failure: Secondary | ICD-10-CM | POA: Insufficient documentation

## 2017-11-17 DIAGNOSIS — Z1211 Encounter for screening for malignant neoplasm of colon: Secondary | ICD-10-CM | POA: Insufficient documentation

## 2017-11-17 DIAGNOSIS — I110 Hypertensive heart disease with heart failure: Secondary | ICD-10-CM | POA: Diagnosis not present

## 2017-11-17 DIAGNOSIS — Z7984 Long term (current) use of oral hypoglycemic drugs: Secondary | ICD-10-CM | POA: Diagnosis not present

## 2017-11-17 DIAGNOSIS — M170 Bilateral primary osteoarthritis of knee: Secondary | ICD-10-CM | POA: Diagnosis not present

## 2017-11-17 DIAGNOSIS — R1314 Dysphagia, pharyngoesophageal phase: Secondary | ICD-10-CM | POA: Diagnosis not present

## 2017-11-17 DIAGNOSIS — Z79899 Other long term (current) drug therapy: Secondary | ICD-10-CM | POA: Insufficient documentation

## 2017-11-17 DIAGNOSIS — K219 Gastro-esophageal reflux disease without esophagitis: Secondary | ICD-10-CM | POA: Diagnosis not present

## 2017-11-17 DIAGNOSIS — R1310 Dysphagia, unspecified: Secondary | ICD-10-CM | POA: Diagnosis not present

## 2017-11-17 DIAGNOSIS — K5730 Diverticulosis of large intestine without perforation or abscess without bleeding: Secondary | ICD-10-CM | POA: Insufficient documentation

## 2017-11-17 DIAGNOSIS — Z800 Family history of malignant neoplasm of digestive organs: Secondary | ICD-10-CM | POA: Insufficient documentation

## 2017-11-17 DIAGNOSIS — Z7901 Long term (current) use of anticoagulants: Secondary | ICD-10-CM | POA: Insufficient documentation

## 2017-11-17 DIAGNOSIS — J45909 Unspecified asthma, uncomplicated: Secondary | ICD-10-CM | POA: Insufficient documentation

## 2017-11-17 DIAGNOSIS — E119 Type 2 diabetes mellitus without complications: Secondary | ICD-10-CM | POA: Diagnosis not present

## 2017-11-17 DIAGNOSIS — Z6841 Body Mass Index (BMI) 40.0 and over, adult: Secondary | ICD-10-CM | POA: Insufficient documentation

## 2017-11-17 DIAGNOSIS — Z9989 Dependence on other enabling machines and devices: Secondary | ICD-10-CM | POA: Insufficient documentation

## 2017-11-17 HISTORY — PX: POLYPECTOMY: SHX5525

## 2017-11-17 HISTORY — PX: COLONOSCOPY: SHX5424

## 2017-11-17 HISTORY — PX: BIOPSY: SHX5522

## 2017-11-17 HISTORY — PX: ESOPHAGOGASTRODUODENOSCOPY: SHX5428

## 2017-11-17 HISTORY — PX: BALLOON DILATION: SHX5330

## 2017-11-17 SURGERY — EGD (ESOPHAGOGASTRODUODENOSCOPY)
Anesthesia: Monitor Anesthesia Care

## 2017-11-17 MED ORDER — SODIUM CHLORIDE 0.9 % IV SOLN: INTRAVENOUS | Status: DC

## 2017-11-17 MED ORDER — PROPOFOL 10 MG/ML IV BOLUS
INTRAVENOUS | Status: DC | PRN
  Administered 2017-11-17: 20 mg via INTRAVENOUS

## 2017-11-17 MED ORDER — LIDOCAINE 2% (20 MG/ML) 5 ML SYRINGE
INTRAMUSCULAR | Status: DC | PRN
  Administered 2017-11-17: 100 mg via INTRAVENOUS

## 2017-11-17 MED ORDER — PROPOFOL 10 MG/ML IV BOLUS
INTRAVENOUS | Status: AC
  Filled 2017-11-17: qty 60

## 2017-11-17 MED ORDER — PROPOFOL 10 MG/ML IV BOLUS
INTRAVENOUS | Status: AC
  Filled 2017-11-17: qty 20

## 2017-11-17 MED ORDER — ALBUTEROL SULFATE HFA 108 (90 BASE) MCG/ACT IN AERS
INHALATION_SPRAY | RESPIRATORY_TRACT | Status: AC
  Filled 2017-11-17: qty 6.7

## 2017-11-17 MED ORDER — LACTATED RINGERS IV SOLN
INTRAVENOUS | Status: DC
  Administered 2017-11-17: 1000 mL via INTRAVENOUS

## 2017-11-17 MED ORDER — PROPOFOL 500 MG/50ML IV EMUL
INTRAVENOUS | Status: DC | PRN
  Administered 2017-11-17: 125 ug/kg/min via INTRAVENOUS

## 2017-11-17 NOTE — Transfer of Care (Signed)
Immediate Anesthesia Transfer of Care Note  Patient: Karen Rasmussen  Procedure(s) Performed: ESOPHAGOGASTRODUODENOSCOPY (EGD) (N/A ) COLONOSCOPY (N/A ) BIOPSY POLYPECTOMY  Patient Location: PACU and Endoscopy Unit  Anesthesia Type:MAC  Level of Consciousness: awake and patient cooperative  Airway & Oxygen Therapy: Patient Spontanous Breathing and Patient connected to nasal cannula oxygen  Post-op Assessment: Report given to RN and Post -op Vital signs reviewed and stable  Post vital signs: Reviewed and stable  Last Vitals:  Vitals Value Taken Time  BP    Temp    Pulse    Resp    SpO2      Last Pain:  Vitals:   11/17/17 0736  TempSrc: Oral  PainSc: 10-Worst pain ever      Patients Stated Pain Goal: 2 (58/83/25 4982)  Complications: No apparent anesthesia complications

## 2017-11-17 NOTE — Op Note (Signed)
[facility] Patient Name: Karen Rasmussen Procedure Date: 11/17/2017 MRN: [id number] Attending MD: Ronnette Juniper , MD Date of Birth: April 13, 1945 CSN: [id number] Age: 72 Admit Type: Outpatient Procedure:                Upper GI endoscopy Indications:              Dysphagia, Diarrhea Providers:                Ronnette Juniper, MD, Zenon Mayo, RN, Tinnie Gens,                            Technician, Dione Booze, CRNA Referring MD:              Medicines:                Monitored Anesthesia Care Complications:            No immediate complications. Estimated blood loss:                            Minimal. Estimated Blood Loss:     Estimated blood loss was minimal. Procedure:                Pre-Anesthesia Assessment:                           - Prior to the procedure, a History and Physical                            was performed, and patient medications and                            allergies were reviewed. The patient's tolerance of                            previous anesthesia was also reviewed. The risks                            and benefits of the procedure and the sedation                            options and risks were discussed with the patient.                            All questions were answered, and informed consent                            was obtained. Prior Anticoagulants: The patient has                            taken Xarelto (rivaroxaban), last dose was 3 days                            prior to procedure. ASA Grade Assessment: III - A  patient with severe systemic disease. After                            reviewing the risks and benefits, the patient was                            deemed in satisfactory condition to undergo the                            procedure.                           After obtaining informed consent, the endoscope was                            passed under direct vision. Throughout the                procedure, the patient's blood pressure, pulse, and                            oxygen saturations were monitored continuously. The                            GIF-H190 ([id number]) Olympus adult endoscope was                            introduced through the mouth, and advanced to the                            second part of duodenum. The upper GI endoscopy was                            accomplished without difficulty. The patient                            tolerated the procedure well. Scope In: Scope Out: Findings:      A low-grade of narrowing Schatzki ring was found at the gastroesophageal       junction. A TTS dilator was passed through the scope. Dilation with an       18-19-20 mm x 5.5 cm CRE balloon dilator was performed from 26mm to 90mm       and then to 20 mm, each for 1 minute. The dilation site was examined       following endoscope reinsertion and showed complete resolution of       luminal narrowing. Estimated blood loss was minimal. Biopsies were       obtained from the proximal and distal esophagus with cold forceps for       histology of suspected eosinophilic esophagitis.      A 2 cm hiatal hernia was present.      Multiple dispersed, diminutive non-bleeding erosions were found in the       cardia, in the gastric fundus, in the gastric body and in the gastric       antrum. There were stigmata of recent bleeding.      Localized mildly erythematous mucosa without bleeding was found in the  gastric antrum. Biopsies were taken with a cold forceps for Helicobacter       pylori testing.      The cardia and gastric fundus were otherwise normal on retroflexion.      The examined duodenum was normal. Biopsies for histology were taken with       a cold forceps for evaluation of celiac disease. Impression:               - Low-grade of narrowing Schatzki ring. Dilated.                            Biopsied.                           - 2 cm hiatal hernia.                            - Non-bleeding erosive gastropathy.                           - Erythematous mucosa in the antrum. Biopsied.                           - Normal examined duodenum. Biopsied. Moderate Sedation:      Patient did not receive moderate sedation for this procedure, but       instead received monitored anesthesia care. Recommendation:           - Patient has a contact number available for                            emergencies. The signs and symptoms of potential                            delayed complications were discussed with the                            patient. Return to normal activities tomorrow.                            Written discharge instructions were provided to the                            patient.                           - Resume regular diet.                           - Continue present medications.                           - Await pathology results.                           - Resume Xarelto (rivaroxaban) at prior dose                            tomorrow.                           -  Use Protonix (pantoprazole) 40 mg PO daily                            indefinitely. Procedure Code(s):        --- Professional ---                           [phone], Esophagogastroduodenoscopy, flexible,                            transoral; with transendoscopic balloon dilation of                            esophagus (less than 30 mm diameter)                           43239, 59, Esophagogastroduodenoscopy, flexible,                            transoral; with biopsy, single or multiple Diagnosis Code(s):        --- Professional ---                           K22.2, Esophageal obstruction                           K44.9, Diaphragmatic hernia without obstruction or                            gangrene                           K31.89, Other diseases of stomach and duodenum                           R13.10, Dysphagia, unspecified                           R19.7, Diarrhea,  unspecified CPT copyright 2018 American Medical Association. All rights reserved. The codes documented in this report are preliminary and upon coder review may  be revised to meet current compliance requirements. Ronnette Juniper, MD 11/17/2017 9:05:42 AM This report has been signed electronically. Number of Addenda: 0

## 2017-11-17 NOTE — Anesthesia Procedure Notes (Signed)
Procedure Name: MAC Date/Time: 11/17/2017 8:15 AM Performed by: Dione Booze, CRNA Pre-anesthesia Checklist: Patient identified, Emergency Drugs available, Suction available and Patient being monitored Patient Re-evaluated:Patient Re-evaluated prior to induction Oxygen Delivery Method: Nasal cannula Placement Confirmation: positive ETCO2

## 2017-11-17 NOTE — Discharge Instructions (Signed)
Colonoscopy, Adult, Care After °This sheet gives you information about how to care for yourself after your procedure. Your doctor may also give you more specific instructions. If you have problems or questions, call your doctor. °Follow these instructions at home: °General instructions ° °· For the first 24 hours after the procedure: °? Do not drive or use machinery. °? Do not sign important documents. °? Do not drink alcohol. °? Do your daily activities more slowly than normal. °? Eat foods that are soft and easy to digest. °? Rest often. °· Take over-the-counter or prescription medicines only as told by your doctor. °· It is up to you to get the results of your procedure. Ask your doctor, or the department performing the procedure, when your results will be ready. °To help cramping and bloating: °· Try walking around. °· Put heat on your belly (abdomen) as told by your doctor. Use a heat source that your doctor recommends, such as a moist heat pack or a heating pad. °? Put a towel between your skin and the heat source. °? Leave the heat on for 20-30 minutes. °? Remove the heat if your skin turns bright red. This is especially important if you cannot feel pain, heat, or cold. You can get burned. °Eating and drinking °· Drink enough fluid to keep your pee (urine) clear or pale yellow. °· Return to your normal diet as told by your doctor. Avoid heavy or fried foods that are hard to digest. °· Avoid drinking alcohol for as long as told by your doctor. °Contact a doctor if: °· You have blood in your poop (stool) 2-3 days after the procedure. °Get help right away if: °· You have more than a small amount of blood in your poop. °· You see large clumps of tissue (blood clots) in your poop. °· Your belly is swollen. °· You feel sick to your stomach (nauseous). °· You throw up (vomit). °· You have a fever. °· You have belly pain that gets worse, and medicine does not help your pain. °This information is not intended to  replace advice given to you by your health care provider. Make sure you discuss any questions you have with your health care provider. °Document Released: 02/27/2010 Document Revised: 10/20/2015 Document Reviewed: 10/20/2015 °Elsevier Interactive Patient Education © 2017 [employer] °Esophagogastroduodenoscopy, Care After °Refer to this sheet in the next few weeks. These instructions provide you with information about caring for yourself after your procedure. Your health care provider may also give you more specific instructions. Your treatment has been planned according to current medical practices, but problems sometimes occur. Call your health care provider if you have any problems or questions after your procedure. °What can I expect after the procedure? °After the procedure, it is common to have: °· A sore throat. °· Nausea. °· Bloating. °· Dizziness. °· Fatigue. ° °Follow these instructions at home: °· Do not eat or drink anything until the numbing medicine (local anesthetic) has worn off and your gag reflex has returned. You will know that the local anesthetic has worn off when you can swallow comfortably. °· Do not drive for 24 hours if you received a medicine to help you relax (sedative). °· If your health care provider took a tissue sample for testing during the procedure, make sure to get your test results. This is your responsibility. Ask your health care provider or the department performing the test when your results will be ready. °· Keep all follow-up visits as told   by your health care provider. This is important. °Contact a health care provider if: °· You cannot stop coughing. °· You are not urinating. °· You are urinating less than usual. °Get help right away if: °· You have trouble swallowing. °· You cannot eat or drink. °· You have throat or chest pain that gets worse. °· You are dizzy or light-headed. °· You faint. °· You have nausea or vomiting. °· You have chills. °· You have a fever. °· You  have severe abdominal pain. °· You have black, tarry, or bloody stools. °This information is not intended to replace advice given to you by your health care provider. Make sure you discuss any questions you have with your health care provider. °Document Released: 01/12/2012 Document Revised: 07/03/2015 Document Reviewed: 12/19/2014 °Elsevier Interactive Patient Education © 2018 Elsevier Inc. ° °

## 2017-11-17 NOTE — H&P (Signed)
Karen Rasmussen is an 72 y.o. female.   Chief Complaint: Dysphagia and family history of colon cancer, multiple tubular adenomas removed in 2016, diarrhea HPI: 71/female with A fib on xarelto(last dose 3 days ago) is scheduled for an EGD with possible dilation due to dysphagia and diarrhea. She is also scheduled for a colonoscopy for surveillance( removal fo 3 tubular adenomas in 2016), family hsitory of colon cancer(both father and mother in their 2s) and diarrhea.  Past Medical History:  Diagnosis Date  . Abdominal pain, left lower quadrant   . Asthmatic bronchitis   . Benign essential HTN 04/26/2016  . Benign hypertensive heart disease without heart failure   . Bursitis of hip   . Chronic diastolic CHF (congestive heart failure) ([place])   . DCM (dilated cardiomyopathy) ([place])    EF 45-50%  . GERD (gastroesophageal reflux disease)   . Heart murmur   . High cholesterol   . History of kidney stones   . Hypersomnia   . Morbid obesity with BMI of 50.0-59.9, adult ([place])   . OSA on CPAP   . Osteoarthritis    "right hip; both knees" (09/21/2016)  . Persistent atrial fibrillation 04/26/2016  . Situational depression 2003   "when I was going thru my divorce"  . Type II diabetes mellitus ([place])   . Vitamin D deficiency disease     Past Surgical History:  Procedure Laterality Date  . ABDOMINAL HYSTERECTOMY    . APPENDECTOMY    . CARDIAC CATHETERIZATION    . CARDIOVERSION N/A 06/11/2016   Procedure: CARDIOVERSION;  Surgeon: Dorothy Spark, MD;  Location: [facility] ENDOSCOPY;  Service: Cardiovascular;  Laterality: N/A;  . CARDIOVERSION N/A 07/16/2016   Procedure: CARDIOVERSION;  Surgeon: Thayer Headings, MD;  Location: [facility] ENDOSCOPY;  Service: Cardiovascular;  Laterality: N/A;  . CARDIOVERSION N/A 09/23/2016   Procedure: CARDIOVERSION;  Surgeon: Sanda Klein, MD;  Location: [facility] ENDOSCOPY;  Service: Cardiovascular;  Laterality: N/A;  . CHOLECYSTECTOMY OPEN    . costisone injection to left knee   11/03/2017  . DILATION AND CURETTAGE OF UTERUS     S/P miscarriage  . NASAL SEPTUM SURGERY    . TONSILLECTOMY      Family History  Problem Relation Age of Onset  . Cancer Father   . Heart failure Father   . Diabetes Father   . CAD Mother   . Diabetes Brother   . Hypertension Brother   . Breast cancer Neg Hx    Social History:  reports that she has never smoked. She has never used smokeless tobacco. She reports that she drinks alcohol. She reports that she does not use drugs.  Allergies:  Allergies  Allergen Reactions  . No [employer] Food Allergies     Defibrillator pads cause rash.    Medications Prior to Admission  Medication Sig Dispense Refill  . acetaminophen (TYLENOL) 650 MG CR tablet Take 650 mg by mouth 2 (two) times daily as needed for pain.    Marland Kitchen acetaminophen-codeine (TYLENOL #3) 300-30 MG tablet Take 1 tablet by mouth 2 (two) times daily as needed for moderate pain.    Marland Kitchen alendronate (FOSAMAX) 70 MG tablet Take 70 mg by mouth every Sunday. Take with a full glass of water on an empty stomach.     . Biotin w/ Vitamins C & E (HAIR SKIN & NAILS GUMMIES PO) Take 2 tablets by mouth daily.     Marland Kitchen CALCIUM PO Take 650 mg by mouth daily.    Marland Kitchen  carvedilol (COREG) 3.125 MG tablet Take 1 tablet (3.125 mg total) by mouth 2 (two) times daily with a meal. 180 tablet 3  . Cholecalciferol (VITAMIN D3) 5000 units CAPS Take 5,000 Units by mouth daily.    Marland Kitchen glimepiride (AMARYL) 4 MG tablet Take 8 mg by mouth daily with breakfast.     . HYDROcodone-acetaminophen (NORCO/VICODIN) 5-325 MG tablet Take 1 tablet by mouth at bedtime as needed for moderate pain.     Marland Kitchen loperamide (IMODIUM) 2 MG capsule Take 4-6 mg by mouth as needed for diarrhea or loose stools.    . metFORMIN (GLUCOPHAGE) 500 MG tablet Take 500 mg by mouth 2 (two) times daily with a meal.     . Misc Natural Products (GLUCOSAMINE CHONDROITIN TRIPLE) TABS Take 1 tablet by mouth 2 (two) times daily.    . mometasone (ELOCON) 0.1 %  cream Apply 1 application topically as needed (rash).    . Multiple Vitamins-Minerals (CENTRUM SILVER) CHEW 2 soft chews by mouth daily    . potassium chloride (K-DUR,KLOR-CON) 10 MEQ tablet TAKE 2 TABLETS BY MOUTH TWICE DAILY 360 tablet 3  . psyllium (METAMUCIL SMOOTH TEXTURE) 28 % packet Take 1 packet by mouth daily as needed (stomach upset).     . simvastatin (ZOCOR) 10 MG tablet Take 10 mg by mouth at bedtime.    . sotalol (BETAPACE) 80 MG tablet TAKE 1 TABLET EVERY 12     HOURS 180 tablet 0  . spironolactone (ALDACTONE) 25 MG tablet TAKE 1/2 TABLET (12.5MG     TOTAL) DAILY (Patient taking differently: Take 12.5 mg by mouth daily. ) 45 tablet 3  . tiZANidine (ZANAFLEX) 2 MG tablet Take 2 mg by mouth every 8 (eight) hours as needed for muscle spasms.   0  . triamcinolone cream (KENALOG) 0.5 % Apply 1 application topically as needed (itching).    Alveda Reasons 20 MG TABS tablet TAKE 1 TABLET DAILY WITH   SUPPER (Patient taking differently: Take 20 mg by mouth every evening. ) 90 tablet 1  . diphenhydrAMINE-zinc acetate (BENADRYL) cream Apply 1 application topically 3 (three) times daily as needed for itching.    . fluocinonide (LIDEX) 0.05 % external solution Apply 1 application topically as needed (elbow rash).      No results found for this or any previous visit (from the past 48 hour(s)). No results found.  Review of Systems  Constitutional: Negative.   HENT: Negative.   Eyes: Negative.   Respiratory: Negative.   Cardiovascular: Negative.   Gastrointestinal: Positive for diarrhea.  Genitourinary: Negative.   Skin: Negative.   Neurological: Negative.   Endo/Heme/Allergies: Negative.   Psychiatric/Behavioral: Negative.     Blood pressure (!) 134/50, pulse (!) 58, temperature 97.8 F (36.6 C), temperature source Oral, resp. rate 15, height 5\' 2"  (1.575 m), weight 122.5 kg, SpO2 100 %. Physical Exam  Constitutional: She is oriented to person, place, and time. She appears well-developed.   HENT:  Head: Normocephalic and atraumatic.  Eyes: Conjunctivae are normal.  Neck: Neck supple.  Respiratory: Effort normal.  GI: Soft.  Neurological: She is alert and oriented to person, place, and time.  Psychiatric: She has a normal mood and affect.     Assessment/Plan Dysphagia and diarrhea Multiple tubular adenomas removed in 2016 Family history of colon cancer in both parents in their 30s  EGD with esophageal and duodenal biopsies and possible balloon dilation Colonoscopy with random colon biopsies.  Ronnette Juniper, MD 11/17/2017, 7:59 AM

## 2017-11-17 NOTE — Anesthesia Postprocedure Evaluation (Signed)
Anesthesia Post Note  Patient: Karen Rasmussen  Procedure(s) Performed: ESOPHAGOGASTRODUODENOSCOPY (EGD) (N/A ) COLONOSCOPY (N/A ) BIOPSY POLYPECTOMY     Patient location during evaluation: PACU Anesthesia Type: MAC Level of consciousness: awake and alert Pain management: pain level controlled Vital Signs Assessment: post-procedure vital signs reviewed and stable Respiratory status: spontaneous breathing, nonlabored ventilation and respiratory function stable Cardiovascular status: blood pressure returned to baseline and stable Postop Assessment: no apparent nausea or vomiting Anesthetic complications: no    Last Vitals:  Vitals:   11/17/17 0910 11/17/17 0920  BP: (!) 129/57 140/60  Pulse: 62 (!) 59  Resp: (!) 21 (!) 23  Temp:    SpO2: 99% 100%    Last Pain:  Vitals:   11/17/17 0736  TempSrc: Oral  PainSc: 10-Worst pain ever                 Brennan Bailey

## 2017-11-17 NOTE — Op Note (Addendum)
[facility] Patient Name: Karen Rasmussen Procedure Date: 11/17/2017 MRN: [id number] Attending MD: Ronnette Juniper , MD Date of Birth: 01/09/1946 CSN: [id number] Age: 72 Admit Type: Outpatient Procedure:                Colonoscopy Indications:              High risk colon cancer surveillance: Personal                            history of multiple (3 or more) adenomas, Family                            history of colon cancer in multiple first-degree                            relatives(both parents in their 74s), Last                            colonoscopy: 2016 Providers:                Ronnette Juniper, MD, Zenon Mayo, RN, Tinnie Gens,                            Technician, Dione Booze, CRNA Referring MD:              Medicines:                Monitored Anesthesia Care Complications:            No immediate complications. Estimated blood loss:                            Minimal. Estimated Blood Loss:     Estimated blood loss was minimal. Procedure:                Pre-Anesthesia Assessment:                           - Prior to the procedure, a History and Physical                            was performed, and patient medications and                            allergies were reviewed. The patient's tolerance of                            previous anesthesia was also reviewed. The risks                            and benefits of the procedure and the sedation                            options and risks were discussed with the patient.                            All questions were answered,  and informed consent                            was obtained. Prior Anticoagulants: The patient has                            taken Xarelto (rivaroxaban), last dose was 3 days                            prior to procedure. ASA Grade Assessment: III - A                            patient with severe systemic disease. After                            reviewing the risks and benefits, the  patient was                            deemed in satisfactory condition to undergo the                            procedure.                           - Prior to the procedure, a History and Physical                            was performed, and patient medications and                            allergies were reviewed. The patient's tolerance of                            previous anesthesia was also reviewed. The risks                            and benefits of the procedure and the sedation                            options and risks were discussed with the patient.                            All questions were answered, and informed consent                            was obtained. Prior Anticoagulants: The patient has                            taken Xarelto (rivaroxaban), last dose was 3 days                            prior to procedure. ASA Grade Assessment: III - A  patient with severe systemic disease. After                            reviewing the risks and benefits, the patient was                            deemed in satisfactory condition to undergo the                            procedure.                           After obtaining informed consent, the colonoscope                            was passed under direct vision. Throughout the                            procedure, the patient's blood pressure, pulse, and                            oxygen saturations were monitored continuously. The                            [id number]-[id number] ([id number]) Olympus peds colonscope was                            introduced through the anus and advanced to the the                            cecum, identified by appendiceal orifice and                            ileocecal valve. The colonoscopy was performed                            without difficulty. The patient tolerated the                            procedure well. The quality of the bowel                             preparation was good. Scope In: 8:40:24 AM Scope Out: 8:58:07 AM Scope Withdrawal Time: 0 hours 14 minutes 9 seconds  Total Procedure Duration: 0 hours 17 minutes 43 seconds  Findings:      The perianal and digital rectal examinations were normal.      Three sessile polyps were found in the ascending colon. The polyps were       3 to 5 mm in size. These polyps were removed with a piecemeal technique       using a cold biopsy forceps. Resection and retrieval were complete.      Two sessile polyps were found in the transverse colon. The polyps were 4       to 7 mm in size. These polyps were removed with a piecemeal  technique       using a cold biopsy forceps. Resection and retrieval were complete.      Biopsies for histology were taken with a cold forceps from the random       areas of right and left colon for evaluation of microscopic colitis.      A few small and large-mouthed diverticula were found in the sigmoid       colon and descending colon.      The exam was otherwise without abnormality on direct and retroflexion       views. Impression:               - Three 3 to 5 mm polyps in the ascending colon,                            removed piecemeal using a cold biopsy forceps.                            Resected and retrieved.                           - Two 4 to 7 mm polyps in the transverse colon,                            removed piecemeal using a cold biopsy forceps.                            Resected and retrieved.                           - Diverticulosis in the sigmoid colon and in the                            descending colon.                           - The examination was otherwise normal on direct                            and retroflexion views.                           - Biopsies were taken with a cold forceps from the                            random areas of right and left colon for evaluation                            of microscopic colitis. Moderate  Sedation:      Patient did not receive moderate sedation for this procedure, but       instead received monitored anesthesia care. Recommendation:           - Patient has a contact number available for                            emergencies. The signs and symptoms  of potential                            delayed complications were discussed with the                            patient. Return to normal activities tomorrow.                            Written discharge instructions were provided to the                            patient.                           - Resume regular diet.                           - Continue present medications.                           - Await pathology results.                           - Repeat colonoscopy for surveillance based on                            pathology results.                           - Resume Xarelto (rivaroxaban) at prior dose                            tomorrow. Procedure Code(s):        --- Professional ---                           [phone], Colonoscopy, flexible; with biopsy, single                            or multiple Diagnosis Code(s):        --- Professional ---                           Z86.010, Personal history of colonic polyps                           D12.2, Benign neoplasm of ascending colon                           D12.3, Benign neoplasm of transverse colon (hepatic                            flexure or splenic flexure)                           Z80.0, Family history of malignant neoplasm of  digestive organs                           K57.30, Diverticulosis of large intestine without                            perforation or abscess without bleeding CPT copyright 2018 American Medical Association. All rights reserved. The codes documented in this report are preliminary and upon coder review may  be revised to meet current compliance requirements. Ronnette Juniper, MD 11/17/2017 9:09:18 AM This report  has been signed electronically. Number of Addenda: 0

## 2017-11-17 NOTE — Brief Op Note (Signed)
11/17/2017  9:09 AM  PATIENT:  Karen Rasmussen  72 y.o. female  PRE-OPERATIVE DIAGNOSIS:  Esophageal dysphagia, History of adenomatous polyp of colon  POST-OPERATIVE DIAGNOSIS:  EGD: Dilation of esophagus up to 51m with heme present.  Tissue biopsies taken from esophagus, gastric antrum and duodenum. Colon: 3 ascending polyps, 2 transverse polyps, random colon biopsies. Diverticula in colon; hemorrhoids  PROCEDURE:  Procedure(s): ESOPHAGOGASTRODUODENOSCOPY (EGD) (N/A) COLONOSCOPY (N/A) BIOPSY POLYPECTOMY  SURGEON:  Surgeon(s) and Role:    *Ronnette Juniper MD - Primary  PHYSICIAN ASSISTANT:   ASSISTANTS: JZenon Mayo RN, GTinnie Gens Tech  ANESTHESIA:   MAC  EBL:  Minimal  BLOOD ADMINISTERED:none  DRAINS: none   LOCAL MEDICATIONS USED:  NONE  SPECIMEN:  Biopsy / Limited Resection  DISPOSITION OF SPECIMEN:  PATHOLOGY  COUNTS:  YES  TOURNIQUET:  * No tourniquets in log *  DICTATION: .Dragon Dictation  PLAN OF CARE: Discharge to home after PACU  PATIENT DISPOSITION:  PACU - hemodynamically stable.   Delay start of Pharmacological VTE agent (>24hrs) due to surgical blood loss or risk of bleeding: yes

## 2017-11-17 NOTE — Anesthesia Preprocedure Evaluation (Addendum)
Anesthesia Evaluation  Patient identified by MRN, date of birth, ID band Patient awake    Reviewed: Allergy & Precautions, NPO status , Patient's Chart, lab work & pertinent test results  Airway Mallampati: III  TM Distance: >3 FB Neck ROM: Full    Dental no notable dental hx. (+) Teeth Intact, Dental Advisory Given   Pulmonary asthma , sleep apnea and Continuous Positive Airway Pressure Ventilation ,    Pulmonary exam normal breath sounds clear to auscultation       Cardiovascular hypertension, Pt. on medications and Pt. on home beta blockers +CHF  Normal cardiovascular exam Rhythm:Regular Rate:Normal  EF 45% to 50%, Diffuse hypokinesis, Trivial TR, PA peak pressure: 25 mm Hg (S).   Neuro/Psych Depression negative neurological ROS     GI/Hepatic Neg liver ROS, GERD  Controlled,  Endo/Other  diabetes, Type 2, Oral Hypoglycemic Agents  Renal/GU negative Renal ROS  negative genitourinary   Musculoskeletal negative musculoskeletal ROS (+)   Abdominal   Peds negative pediatric ROS (+)  Hematology negative hematology ROS (+)   Anesthesia Other Findings   Reproductive/Obstetrics negative OB ROS                            Anesthesia Physical Anesthesia Plan  ASA: III  Anesthesia Plan: MAC   Post-op Pain Management:    Induction:   PONV Risk Score and Plan: 2 and Ondansetron and Treatment may vary due to age or medical condition  Airway Management Planned: Natural Airway and Nasal Cannula  Additional Equipment:   Intra-op Plan:   Post-operative Plan:   Informed Consent: I have reviewed the patients History and Physical, chart, labs and discussed the procedure including the risks, benefits and alternatives for the proposed anesthesia with the patient or authorized representative who has indicated his/her understanding and acceptance.   Dental advisory given  Plan Discussed with:  CRNA and Surgeon  Anesthesia Plan Comments:         Anesthesia Quick Evaluation

## 2017-11-19 ENCOUNTER — Encounter (HOSPITAL_COMMUNITY): Payer: Self-pay | Admitting: Gastroenterology

## 2017-12-06 ENCOUNTER — Telehealth: Payer: Self-pay | Admitting: Cardiology

## 2017-12-06 NOTE — Telephone Encounter (Signed)
New message:      Pt c/o medication issue:  1. Name of Medication: XARELTO 20 MG TABS tablet  2. How are you currently taking this medication (dosage and times per day)? TAKE 1 TABLET DAILY WITH SUPPER Patient taking differently: Take 20 mg by mouth every evening.   3. Are you having a reaction (difficulty breathing--STAT)? No  4. What is your medication issue? Pt states she was just sitting on the couch last night watching tv and all of a sudden her nose starts bleeding. She states it was bright red blood.

## 2017-12-06 NOTE — Telephone Encounter (Signed)
Please refer her to ENT

## 2017-12-06 NOTE — Telephone Encounter (Signed)
Spoke with pt and informed pt Xarelto does not cause nose bleeds it may make harder to stop but try using saline nasal spray as air may be dry .Also pt to call back if nose bleeds occur more frequently and become harder to stop.Pt agrees with plan .Will forward to Dr Radford Pax .Adonis Housekeeper

## 2017-12-08 NOTE — Telephone Encounter (Signed)
Left patient a voicemail to call back

## 2017-12-13 NOTE — Telephone Encounter (Signed)
Patient stated this was a one time event and that she did not need to go to an ENT. She expressed understanding and had no further questions.

## 2017-12-30 DIAGNOSIS — Z6841 Body Mass Index (BMI) 40.0 and over, adult: Secondary | ICD-10-CM | POA: Diagnosis not present

## 2017-12-30 DIAGNOSIS — R202 Paresthesia of skin: Secondary | ICD-10-CM | POA: Diagnosis not present

## 2017-12-30 DIAGNOSIS — E1169 Type 2 diabetes mellitus with other specified complication: Secondary | ICD-10-CM | POA: Diagnosis not present

## 2017-12-30 DIAGNOSIS — M1712 Unilateral primary osteoarthritis, left knee: Secondary | ICD-10-CM | POA: Diagnosis not present

## 2018-01-22 ENCOUNTER — Other Ambulatory Visit: Payer: Self-pay | Admitting: Cardiology

## 2018-01-25 ENCOUNTER — Telehealth: Payer: Self-pay | Admitting: Cardiology

## 2018-01-25 NOTE — Telephone Encounter (Signed)
Advised the patient that her medication was refilled on 01/23/18.

## 2018-01-25 NOTE — Telephone Encounter (Signed)
New Message          *STAT* If patient is at the pharmacy, call can be transferred to refill team.   1. Which medications need to be refilled? (please list name of each medication and dose if known) Sotalolhcl Tab 80 mg  2. Which pharmacy/location (including street and city if local pharmacy) is medication to be sent to?[facility]  3. Do they need a 30 day or 90 day supply? 180  Patient would like to make sure all of her refills are up to date and to fill any that need to be filled.

## 2018-01-27 DIAGNOSIS — G894 Chronic pain syndrome: Secondary | ICD-10-CM | POA: Diagnosis not present

## 2018-01-27 DIAGNOSIS — M7541 Impingement syndrome of right shoulder: Secondary | ICD-10-CM | POA: Diagnosis not present

## 2018-01-27 DIAGNOSIS — M1711 Unilateral primary osteoarthritis, right knee: Secondary | ICD-10-CM | POA: Diagnosis not present

## 2018-01-27 DIAGNOSIS — Z6841 Body Mass Index (BMI) 40.0 and over, adult: Secondary | ICD-10-CM | POA: Diagnosis not present

## 2018-01-27 DIAGNOSIS — M160 Bilateral primary osteoarthritis of hip: Secondary | ICD-10-CM | POA: Diagnosis not present

## 2018-01-27 DIAGNOSIS — M1712 Unilateral primary osteoarthritis, left knee: Secondary | ICD-10-CM | POA: Diagnosis not present

## 2018-01-31 ENCOUNTER — Other Ambulatory Visit (HOSPITAL_COMMUNITY): Payer: Self-pay | Admitting: Physician Assistant

## 2018-02-07 ENCOUNTER — Telehealth: Payer: Self-pay

## 2018-02-07 NOTE — Telephone Encounter (Signed)
Pt called stating that she will need a tier exception for 2020. She stated that this can not be done until after Jan. 1. She will have the same Insurance. The 1st Mth it will cost her $506, then it will be $224/mth out of pocket.   She ask that we call her Ins. Co at [phone] for this.

## 2018-02-09 NOTE — Telephone Encounter (Signed)
Tier exception done for [facility] and faxed to [facility] as requested by patient.

## 2018-02-10 NOTE — Telephone Encounter (Signed)
Letter received from [place] via fax stating that they agree with their initial determination to deny this tier exception request for Xarelto. Reason: The requested drug is on the pts formulary (list of covered drugs) at the preferred tier. Name brand Xarelto is listed at a tier 3, the lowest cost share allowed by the pts plan.  I have discussed with the pt and she states that she will remain in Xarelto as she thinks she can afford it at this time. She is aware to call us back if she can no longer afford her Xarelto.

## 2018-02-10 NOTE — Telephone Encounter (Signed)
**Note De-Identified Karen Rasmussen Obfuscation** Letter received from [place] stating that they have denied the tier exception for Xarelto. Reason: Xarelto is already at a tier 3 and under Medicare Part D rules once a name brand medication has been lowered to a tier 3 it cannot be lowered any further.  I have appealed this tier exception. Awaiting determination.

## 2018-04-04 ENCOUNTER — Encounter: Payer: Self-pay | Admitting: Cardiology

## 2018-04-04 ENCOUNTER — Ambulatory Visit (INDEPENDENT_AMBULATORY_CARE_PROVIDER_SITE_OTHER): Payer: Medicare Other | Admitting: Cardiology

## 2018-04-04 VITALS — BP 167/71 | HR 61 | Ht 62.0 in | Wt 281.1 lb

## 2018-04-04 DIAGNOSIS — R0789 Other chest pain: Secondary | ICD-10-CM

## 2018-04-04 DIAGNOSIS — I10 Essential (primary) hypertension: Secondary | ICD-10-CM | POA: Diagnosis not present

## 2018-04-04 DIAGNOSIS — G4733 Obstructive sleep apnea (adult) (pediatric): Secondary | ICD-10-CM | POA: Diagnosis not present

## 2018-04-04 DIAGNOSIS — I420 Dilated cardiomyopathy: Secondary | ICD-10-CM | POA: Diagnosis not present

## 2018-04-04 DIAGNOSIS — I4819 Other persistent atrial fibrillation: Secondary | ICD-10-CM

## 2018-04-04 DIAGNOSIS — I5032 Chronic diastolic (congestive) heart failure: Secondary | ICD-10-CM

## 2018-04-04 MED ORDER — SOTALOL HCL 80 MG PO TABS: ORAL_TABLET | ORAL | 3 refills | Status: DC

## 2018-04-04 MED ORDER — SPIRONOLACTONE 25 MG PO TABS: ORAL_TABLET | ORAL | 3 refills | Status: DC

## 2018-04-04 MED ORDER — CARVEDILOL 3.125 MG PO TABS: 3.1250 mg | ORAL_TABLET | Freq: Two times a day (BID) | ORAL | 3 refills | Status: DC

## 2018-04-04 MED ORDER — RIVAROXABAN 20 MG PO TABS: 20.0000 mg | ORAL_TABLET | Freq: Every evening | ORAL | 3 refills | Status: DC

## 2018-04-04 MED ORDER — POTASSIUM CHLORIDE CRYS ER 10 MEQ PO TBCR: EXTENDED_RELEASE_TABLET | ORAL | 3 refills | Status: DC

## 2018-04-04 MED ORDER — SIMVASTATIN 10 MG PO TABS: 10.0000 mg | ORAL_TABLET | Freq: Every day | ORAL | 3 refills | Status: DC

## 2018-04-04 NOTE — Patient Instructions (Signed)
Medication Instructions:  Your physician recommends that you continue on your current medications as directed. Please refer to the Current Medication list given to you today.  If you need a refill on your cardiac medications before your next appointment, please call your pharmacy.   Lab work: Today: CBC  If you have labs (blood work) drawn today and your tests are completely normal, you will receive your results only by: Marland Kitchen MyChart Message (if you have MyChart) OR . A paper copy in the mail If you have any lab test that is abnormal or we need to change your treatment, we will call you to review the results.  Testing/Procedures: None  Follow-Up: At [facility], you and your health needs are our priority.  As part of our continuing mission to provide you with exceptional heart care, we have created designated Provider Care Teams.  These Care Teams include your primary Cardiologist (physician) and Advanced Practice Providers (APPs -  Physician Assistants and Nurse Practitioners) who all work together to provide you with the care you need, when you need it. You will need a follow up appointment in 6 months.  Please call our office 2 months in advance to schedule this appointment.  You may see Fransico Him, MD or one of the following Advanced Practice Providers on your designated Care Team:   [place], PA-C Melina Copa, PA-C . Ermalinda Barrios, PA-C

## 2018-04-04 NOTE — Progress Notes (Signed)
Cardiology Office Note:    Date:  04/04/2018   ID:  Karen Rasmussen, DOB Oct 17, 1945, MRN [id number]  PCP:  Kathyrn Lass, MD  Cardiologist:  Fransico Him, MD    Referring MD: Kathyrn Lass, MD   Chief Complaint  Patient presents with  . Sleep Apnea  . Atrial Fibrillation  . Hypertension  . Cardiomyopathy    History of Present Illness:    Karen Rasmussen is a 73 y.o. female with a hx of HTN, presumed DCMEF 45-50%, DM, Morbid obesity, OSA on CPAP, and PAFibwith CHADSVASC=5 on Xarelto s/p Sotolol load and  DCCV 09/23/16.She had low risk Lexi scan stress test 05/2016 and coronary CTA 09/24/16 showed calcium score 0 and normal coronaries .   She is here today for followup and is doing well.  She denies any chest pain or pressure, SOB, DOE, PND, orthopnea, LE edema, dizziness, palpitations or syncope. she is compliant with her meds and is tolerating meds with no SE.  She is doing well with her CPAP device.  She tolerates the mask and feels the pressure is adequate.  Since going on CPAP she feels rested in the am and has no significant daytime sleepiness.  She denies any significant mouth or nasal dryness or nasal congestion.  She does not think that he snores.     Past Medical History:  Diagnosis Date  . Abdominal pain, left lower quadrant   . Asthmatic bronchitis   . Benign essential HTN 04/26/2016  . Benign hypertensive heart disease without heart failure   . Bursitis of hip   . Chronic diastolic CHF (congestive heart failure) ([place])   . DCM (dilated cardiomyopathy) ([place])    EF 45-50%  . GERD (gastroesophageal reflux disease)   . Heart murmur   . High cholesterol   . History of kidney stones   . Hypersomnia   . Morbid obesity with BMI of 50.0-59.9, adult ([place])   . OSA on CPAP   . Osteoarthritis    "right hip; both knees" (09/21/2016)  . Persistent atrial fibrillation 04/26/2016  . Situational depression 2003   "when I was going thru my divorce"  . Type II diabetes mellitus ([place])   .  Vitamin D deficiency disease     Past Surgical History:  Procedure Laterality Date  . ABDOMINAL HYSTERECTOMY    . APPENDECTOMY    . BALLOON DILATION N/A 11/17/2017   Procedure: BALLOON DILATION;  Surgeon: Ronnette Juniper, MD;  Location: [facility];  Service: Gastroenterology;  Laterality: N/A;  . BIOPSY  11/17/2017   Procedure: BIOPSY;  Surgeon: Ronnette Juniper, MD;  Location: [facility] ENDOSCOPY;  Service: Gastroenterology;;  . CARDIAC CATHETERIZATION    . CARDIOVERSION N/A 06/11/2016   Procedure: CARDIOVERSION;  Surgeon: Dorothy Spark, MD;  Location: [facility] ENDOSCOPY;  Service: Cardiovascular;  Laterality: N/A;  . CARDIOVERSION N/A 07/16/2016   Procedure: CARDIOVERSION;  Surgeon: Thayer Headings, MD;  Location: [facility] ENDOSCOPY;  Service: Cardiovascular;  Laterality: N/A;  . CARDIOVERSION N/A 09/23/2016   Procedure: CARDIOVERSION;  Surgeon: Sanda Klein, MD;  Location: [facility] ENDOSCOPY;  Service: Cardiovascular;  Laterality: N/A;  . CHOLECYSTECTOMY OPEN    . COLONOSCOPY N/A 11/17/2017   Procedure: COLONOSCOPY;  Surgeon: Ronnette Juniper, MD;  Location: [facility] ENDOSCOPY;  Service: Gastroenterology;  Laterality: N/A;  . costisone injection to left knee  11/03/2017  . DILATION AND CURETTAGE OF UTERUS     S/P miscarriage  . ESOPHAGOGASTRODUODENOSCOPY N/A 11/17/2017   Procedure: ESOPHAGOGASTRODUODENOSCOPY (EGD);  Surgeon: Ronnette Juniper, MD;  Location: [facility] ENDOSCOPY;  Service: Gastroenterology;  Laterality: N/A;  . NASAL SEPTUM SURGERY    . POLYPECTOMY  11/17/2017   Procedure: POLYPECTOMY;  Surgeon: Ronnette Juniper, MD;  Location: [facility] ENDOSCOPY;  Service: Gastroenterology;;  . TONSILLECTOMY      Current Medications: Current Meds  Medication Sig  . acetaminophen (TYLENOL) 650 MG CR tablet Take 650 mg by mouth 2 (two) times daily as needed for pain.  Marland Kitchen acetaminophen-codeine (TYLENOL #3) 300-30 MG tablet Take 1 tablet by mouth 2 (two) times daily as needed for moderate pain.  Marland Kitchen alendronate (FOSAMAX) 70 MG tablet Take 70 mg  by mouth every Sunday. Take with a full glass of water on an empty stomach.   . Biotin w/ Vitamins C & E (HAIR SKIN & NAILS GUMMIES PO) Take 2 tablets by mouth daily.   Marland Kitchen CALCIUM PO Take 650 mg by mouth daily.  . carvedilol (COREG) 3.125 MG tablet Take 1 tablet (3.125 mg total) by mouth 2 (two) times daily with a meal.  . Cholecalciferol (VITAMIN D3) 5000 units CAPS Take 5,000 Units by mouth daily.  . diphenhydrAMINE-zinc acetate (BENADRYL) cream Apply 1 application topically 3 (three) times daily as needed for itching.  . fluocinonide (LIDEX) 0.05 % external solution Apply 1 application topically as needed (elbow rash).  Marland Kitchen glimepiride (AMARYL) 4 MG tablet Take 8 mg by mouth daily with breakfast.   . HYDROcodone-acetaminophen (NORCO/VICODIN) 5-325 MG tablet Take 1 tablet by mouth at bedtime as needed for moderate pain.   Marland Kitchen loperamide (IMODIUM) 2 MG capsule Take 4-6 mg by mouth as needed for diarrhea or loose stools.  . metFORMIN (GLUCOPHAGE) 500 MG tablet Take 500 mg by mouth 2 (two) times daily with a meal.   . Misc Natural Products (GLUCOSAMINE CHONDROITIN TRIPLE) TABS Take 1 tablet by mouth 2 (two) times daily.  . mometasone (ELOCON) 0.1 % cream Apply 1 application topically as needed (rash).  . Multiple Vitamins-Minerals (CENTRUM SILVER) CHEW 2 soft chews by mouth daily  . potassium chloride (K-DUR,KLOR-CON) 10 MEQ tablet TAKE 2 TABLETS BY MOUTH TWICE DAILY  . psyllium (METAMUCIL SMOOTH TEXTURE) 28 % packet Take 1 packet by mouth daily as needed (stomach upset).   . simvastatin (ZOCOR) 10 MG tablet Take 10 mg by mouth at bedtime.  . sotalol (BETAPACE) 80 MG tablet TAKE 1 TABLET(80 MG) BY MOUTH EVERY 12 HOURS  . spironolactone (ALDACTONE) 25 MG tablet TAKE 1/2 TABLET (12.5MG     TOTAL) DAILY (Patient taking differently: Take 12.5 mg by mouth daily. )  . tiZANidine (ZANAFLEX) 2 MG tablet Take 2 mg by mouth every 8 (eight) hours as needed for muscle spasms.   Marland Kitchen triamcinolone cream (KENALOG) 0.5 %  Apply 1 application topically as needed (itching).  Alveda Reasons 20 MG TABS tablet TAKE 1 TABLET DAILY WITH   SUPPER (Patient taking differently: Take 20 mg by mouth every evening. )     Allergies:   No healthtouch food allergies   Social History   Socioeconomic History  . Marital status: Divorced    Spouse name: Not on file  . Number of children: Not on file  . Years of education: Not on file  . Highest education level: Not on file  Occupational History  . Not on file  Social Needs  . Financial resource strain: Not on file  . Food insecurity:    Worry: Not on file    Inability: Not on file  . Transportation needs:    Medical: Not on  file    Non-medical: Not on file  Tobacco Use  . Smoking status: Never Smoker  . Smokeless tobacco: Never Used  Substance and Sexual Activity  . Alcohol use: Yes    Comment: 09/21/2016 "might have a glass of wine once/year; if that"  . Drug use: No  . Sexual activity: Never  Lifestyle  . Physical activity:    Days per week: Not on file    Minutes per session: Not on file  . Stress: Not on file  Relationships  . Social connections:    Talks on phone: Not on file    Gets together: Not on file    Attends religious service: Not on file    Active member of club or organization: Not on file    Attends meetings of clubs or organizations: Not on file    Relationship status: Not on file  Other Topics Concern  . Not on file  Social History Narrative  . Not on file     Family History: The patient's family history includes CAD in her mother; Cancer in her father; Diabetes in her brother and father; Heart failure in her father; Hypertension in her brother. There is no history of Breast cancer.  ROS:   Please see the history of present illness.    ROS  All other systems reviewed and negative.   EKGs/Labs/Other Studies Reviewed:    The following studies were reviewed today: PAP download  EKG:  EKG is  ordered today.  The ekg ordered today  demonstrates normal sinus rhythm at 61 bpm with left anterior fascicular block and no acute ST changes.    Recent Labs: No results found for requested labs within last 8760 hours.   Recent Lipid Panel No results found for: CHOL, TRIG, HDL, CHOLHDL, VLDL, LDLCALC, LDLDIRECT  Physical Exam:    VS:  BP (!) 167/71   Pulse 61   Ht 5\' 2"  (1.575 m)   Wt 281 lb 1.9 oz (127.5 kg) Comment: Pt stated she weighs less without shoes on  BMI 51.42 kg/m     Wt Readings from Last 3 Encounters:  04/04/18 281 lb 1.9 oz (127.5 kg)  11/17/17 270 lb (122.5 kg)  11/02/17 284 lb (128.8 kg)     GEN:  Well nourished, well developed in no acute distress HEENT: Normal NECK: No JVD; No carotid bruits LYMPHATICS: No lymphadenopathy CARDIAC: RRR, no murmurs, rubs, gallops RESPIRATORY:  Clear to auscultation without rales, wheezing or rhonchi  ABDOMEN: Soft, non-tender, non-distended MUSCULOSKELETAL:  No edema; No deformity  SKIN: Warm and dry NEUROLOGIC:  Alert and oriented x 3 PSYCHIATRIC:  Normal affect   ASSESSMENT:    1. Chronic diastolic CHF (congestive heart failure) ([place])   2. Benign essential HTN   3. Persistent atrial fibrillation   4. OSA (obstructive sleep apnea)   5. DCM (dilated cardiomyopathy) ([place])   6. Atypical chest pain    PLAN:    In order of problems listed above:  1.  Chronic diastolic CHF -appears well compensated on exam today.  Continue on spironolactone 12.5 mg daily.  2.  HTN - BP is well controlled on exam today.  Continue on carvedilol 3.125 mg twice daily and spironolactone 12.5 mg daily.  I will refill her cardiac meds today.  3.  Persistent atrial fibrillation -she remains in normal sinus rhythm on exam.  Her QTC on EKG today was 446 ms.  She will continue on sotalol 80 mg twice daily and Xarelto 20 mg  daily as well as carvedilol 3.125 mg twice daily.  Creatinine was 0.79 potassium 4.4 on 12/30/2017.  She is due for a hemoglobin checked which I will do today.  4.   OSA - the patient is tolerating PAP therapy well without any problems. The PAP download was reviewed today and showed an AHI of 1.3/hr on auto PAP with 73% compliance in using more than 4 hours nightly.  The patient has been using and benefiting from PAP use and will continue to benefit from therapy.  She needs a refill on her prescription for CPAP supplies which I am sending in today to her DME.  5. Nonischemic  Dilated cardiomyopathy - EF 45-50% by echo 05/2016 with normal coronary arteries by coronary CTA.  6.  Atypical chest pain - she has had several episodes of pressure right below the xiphoid process that goes into her back between her shoulder blades usually right after she eats and she is also noted that she is spitting up more mucus than usual.  She does have a history of hiatal hernia and reflux and has been on medication.  EKG shows no acute changes and given that she had a normal coronary CTA a year and a half ago I do not think this is cardiac related.  I recommend she follow back up with her PCP.  Likely she will need a referral back to GI since she is also had esophageal dilatation in the past as well.     Medication Adjustments/Labs and Tests Ordered: Current medicines are reviewed at length with the patient today.  Concerns regarding medicines are outlined above.  No orders of the defined types were placed in this encounter.  No orders of the defined types were placed in this encounter.   Signed, Fransico Him, MD  04/04/2018 1:20 PM    [place]

## 2018-04-05 ENCOUNTER — Telehealth: Payer: Self-pay | Admitting: *Deleted

## 2018-04-05 ENCOUNTER — Telehealth: Payer: Self-pay | Admitting: Cardiology

## 2018-04-05 LAB — CBC
HEMOGLOBIN: 13.6 g/dL (ref 11.1–15.9)
Hematocrit: 41.5 % (ref 34.0–46.6)
MCH: 28.6 pg (ref 26.6–33.0)
MCHC: 32.8 g/dL (ref 31.5–35.7)
MCV: 87 fL (ref 79–97)
Platelets: 260 10*3/uL (ref 150–450)
RBC: 4.76 x10E6/uL (ref 3.77–5.28)
RDW: 13 % (ref 11.7–15.4)
WBC: 11 10*3/uL — ABNORMAL HIGH (ref 3.4–10.8)

## 2018-04-05 NOTE — Telephone Encounter (Signed)
New Message   Patient is calling to have her records updated to reflect that she has been given a new medication for pantoprazole  sodium DR 40mg  a day. No call back needed.

## 2018-04-05 NOTE — Telephone Encounter (Signed)
Order placed to [facility] fax today.

## 2018-04-05 NOTE — Telephone Encounter (Signed)
Updated EMR.

## 2018-04-05 NOTE — Telephone Encounter (Signed)
-----   Message from Sarina Ill, RN sent at 04/05/2018  8:25 AM EST ----- Regarding: [facility], Dr. Radford Pax ordered CPAP supplies. Please send. Thanks, [employer]

## 2018-05-15 DIAGNOSIS — J209 Acute bronchitis, unspecified: Secondary | ICD-10-CM | POA: Diagnosis not present

## 2018-05-15 DIAGNOSIS — J019 Acute sinusitis, unspecified: Secondary | ICD-10-CM | POA: Diagnosis not present

## 2018-07-25 DIAGNOSIS — D225 Melanocytic nevi of trunk: Secondary | ICD-10-CM | POA: Diagnosis not present

## 2018-07-25 DIAGNOSIS — L820 Inflamed seborrheic keratosis: Secondary | ICD-10-CM | POA: Diagnosis not present

## 2018-07-25 DIAGNOSIS — L245 Irritant contact dermatitis due to other chemical products: Secondary | ICD-10-CM | POA: Diagnosis not present

## 2018-07-25 DIAGNOSIS — L570 Actinic keratosis: Secondary | ICD-10-CM | POA: Diagnosis not present

## 2018-07-25 DIAGNOSIS — D2272 Melanocytic nevi of left lower limb, including hip: Secondary | ICD-10-CM | POA: Diagnosis not present

## 2018-07-25 DIAGNOSIS — D1801 Hemangioma of skin and subcutaneous tissue: Secondary | ICD-10-CM | POA: Diagnosis not present

## 2018-07-25 DIAGNOSIS — D2239 Melanocytic nevi of other parts of face: Secondary | ICD-10-CM | POA: Diagnosis not present

## 2018-07-25 DIAGNOSIS — L821 Other seborrheic keratosis: Secondary | ICD-10-CM | POA: Diagnosis not present

## 2018-08-05 ENCOUNTER — Other Ambulatory Visit: Payer: Self-pay | Admitting: Internal Medicine

## 2018-08-30 DIAGNOSIS — Z0000 Encounter for general adult medical examination without abnormal findings: Secondary | ICD-10-CM | POA: Diagnosis not present

## 2018-09-08 DIAGNOSIS — E119 Type 2 diabetes mellitus without complications: Secondary | ICD-10-CM | POA: Diagnosis not present

## 2018-09-08 DIAGNOSIS — M2550 Pain in unspecified joint: Secondary | ICD-10-CM | POA: Diagnosis not present

## 2018-09-08 DIAGNOSIS — I119 Hypertensive heart disease without heart failure: Secondary | ICD-10-CM | POA: Diagnosis not present

## 2018-09-08 DIAGNOSIS — E7800 Pure hypercholesterolemia, unspecified: Secondary | ICD-10-CM | POA: Diagnosis not present

## 2018-09-08 DIAGNOSIS — G4733 Obstructive sleep apnea (adult) (pediatric): Secondary | ICD-10-CM | POA: Diagnosis not present

## 2018-09-08 DIAGNOSIS — M810 Age-related osteoporosis without current pathological fracture: Secondary | ICD-10-CM | POA: Diagnosis not present

## 2018-09-08 DIAGNOSIS — K219 Gastro-esophageal reflux disease without esophagitis: Secondary | ICD-10-CM | POA: Diagnosis not present

## 2018-10-03 ENCOUNTER — Ambulatory Visit: Payer: Medicare Other | Admitting: Cardiology

## 2018-10-09 DIAGNOSIS — Z23 Encounter for immunization: Secondary | ICD-10-CM | POA: Diagnosis not present

## 2018-10-19 NOTE — Progress Notes (Addendum)
Cardiology Office Note:    Date:  10/20/2018   ID:  Karen Rasmussen, DOB [birth year], MRN [id number]  PCP:  Kathyrn Lass, MD  Cardiologist:  Fransico Him, MD  Electrophysiologist:  None   Referring MD: Kathyrn Lass, MD   Chief Complaint: Follow-Up of Atrial Fibrillation  History of Present Illness:    Karen Rasmussen is a 73 y.o. female with a history of normal coronary arteries with calcium score of 0 on coronary CTA in 07/2829, chronic diastolic CHF/non-ischemic cardiomyopathy with EF of 45-50%, paroxysmal atrial fibrillation s/p DCCV x2 and now on Sotalol and Xarelto, obstructive sleep apnea on CPAP, hypertension, diabetes mellitus, and morbid obesity.  Patient has been followed by Dr. Radford Pax for several years for obstructive sleep apnea and chronic diastolic CHF with chronic dyspnea. At office visit in 04/2016, she was noted to be in new onset atrial fibrillation. CHA2DS2-VASc = 5 so patient was started on Xarelto. Echo was ordered and showed LVEF of 45-50% with diffuse hypokinesis. Myoview was ordered for evaluation of ischemia. Study was low with fixed defect in basal septum and inferior walls. Felt to be attenuation vs artifact but coronary CT was ordered for confirmation. Coronary CT showed calcium score of 0 with normal coronary arteries. Patient underwent successful DCCV in 06/2016 after 4 weeks of uninterrupted anticoagulation. However, patient developed allergic reaction from pads and required a course of Prednisone. At follow-up visit in 07/2016, she was noted to be back in atrial fibrillation and was referred to EP. Patient underwent repeat DCCV which was initially successful was ultimately went back in atrial fibrillation. She was admitted in 09/2016 for initiation of Sotalol.   Patient was last seen by Dr. Radford Pax in 03/2018 at which time she reported continued chronic stable dyspnea on exertion as well as occasional lower extremity edema but was otherwise doing well from a cardiac standpoint.  She was maintaining sinus rhythm at that time.   Patient presents today for follow-up. Patient doing well today from a cardiac standpoint. Continues to have chronic dyspnea on exertion but she reports that this is stable and no worse than normal. She occasional has very brief episodes of substernal sharp chest pain that only usually last a minute and then resolves independently. This sometimes occurs with eating. Denies any exertional chest pain. Patient thinks this is do to her hiatal hernia and is not worried about it. I agree that it sounds non-cardiac. She continues to have some lower extremity edema. She states her left leg is always a little bit larger than the right. About 2 weeks ago, she was visiting family in [place] and noticed that her left leg was more swollen than normal. She also noticed an area behind her lower left calf that was red and slightly raised but not painful or warm to the touch. This area is still there but has improved. Patient states swelling has since improved and is now back to her baseline. She reports occasional lightheadedness/dizziness with standing to quickly so she is very careful. No syncope or falls. She bruises easily on the Xarelto but denies any abnormal bleeding.   Past Medical History:  Diagnosis Date   Abdominal pain, left lower quadrant    Asthmatic bronchitis    Benign essential HTN 04/26/2016   Benign hypertensive heart disease without heart failure    Bursitis of hip    Chronic diastolic CHF (congestive heart failure) (HCC)    DCM (dilated cardiomyopathy) (HCC)    EF 45-50%   GERD (gastroesophageal  reflux disease)    Heart murmur    High cholesterol    History of kidney stones    Hypersomnia    Morbid obesity with BMI of 50.0-59.9, adult (HCC)    OSA on CPAP    Osteoarthritis    "right hip; both knees" (09/21/2016)   Persistent atrial fibrillation 04/26/2016   Situational depression 2003   "when I was going thru my divorce"    Type II diabetes mellitus ([place])    Vitamin D deficiency disease     Past Surgical History:  Procedure Laterality Date   ABDOMINAL HYSTERECTOMY     APPENDECTOMY     BALLOON DILATION N/A 11/17/2017   Procedure: BALLOON DILATION;  Surgeon: Ronnette Juniper, MD;  Location: [facility] END[facility]COPY;  Service: Gastroenterology;  Laterality: N/A;   BIOPSY  11/17/2017   Procedure: BIOPSY;  Surgeon: Ronnette Juniper, MD;  Location: [facility];  Service: Gastroenterology;;   CARDIAC CATHETERIZATION     CARDIOVERSION N/A 06/11/2016   Procedure: CARDIOVERSION;  Surgeon: Dorothy Spark, MD;  Location[facility]COPY;  Service: Cardiovascular;  Laterality: N/A;   CARDIOVERSION N/A 07/16/2016   Procedure: CARDIOVERSION;  Surgeon: Thayer Headings, MD;  Location: [facility] ENDOSCOPY;  Service: Cardiovascular;  Laterality: N/A;   CARDIOVERSION N/A 09/23/2016   Procedure: CARDIOVERSION;  Surgeon: Sanda Klein, MD;  Location: [facility]OSCOPY;  Service: Cardiovascular;  Laterality: N/A;   CHOLECYSTECTOMY OPEN     COLONOSCOPY N/A 11/17/2017   Procedure: COLONOSCOPY;  Surgeon: Ronnette Juniper, MD;  Location: [facility] ENDOSCOPY;  Service: Gastroenterology;  Laterality: N/A;   costisone injection to left knee  11/03/2017   DILATION AND CURETTAGE OF UTERUS     S/P miscarriage   ESOPHAGOGASTRODUODENOSCOPY N/A 11/17/2017   Procedure: ESOPHAGOGASTRODUODENOSCOPY (EGD);  Surgeon: Ronnette Juniper, MD;  Location: [facility];  Service: Gastroenterology;  Laterality: N/A;   NASAL SEPTUM SURGERY     POLYPECTOMY  11/17/2017   Procedure: POLYPECTOMY;  Surgeon: Ronnette Juniper, MD;  Location: [facility] ENDOS[facility];  Service: Gastroenterology;;   TONSILLECTOMY      Current Medications: Current Meds  Medication Sig   acetaminophen (TYLENOL) 650 MG CR tablet Take 650 mg by mouth 2 (two) times daily as needed for pain.   acetaminophen-codeine (TYLENOL #3) 300-30 MG tablet Take 1 tablet by mouth 2 (two) times daily as needed for moderate pain.    alendronate (FOSAMAX) 70 MG tablet Take 70 mg by mouth every Sunday. Take with a full glass of water on an empty stomach.    Biotin w/ Vitamins C & E (HAIR SKIN & NAILS GUMMIES PO) Take 2 tablets by mouth daily.    CALCIUM PO Take 650 mg by mouth daily.   carvedilol (COREG) 3.125 MG tablet Take 1 tablet (3.125 mg total) by mouth 2 (two) times daily with a meal.   Cholecalciferol (VITAMIN D3) 5000 units CAPS Take 5,000 Units by mouth daily.   diphenhydrAMINE-zinc acetate (BENADRYL) cream Apply 1 application topically 3 (three) times daily as needed for itching.   fluocinonide (LIDEX) 0.05 % external solution Apply 1 application topically as needed (elbow rash).   glimepiride (AMARYL) 4 MG tablet Take 8 mg by mouth daily with breakfast.    HYDROcodone-acetaminophen (NORCO/VICODIN) 5-325 MG tablet Take 1 tablet by mouth at bedtime as needed for moderate pain.    loperamide (IMODIUM) 2 MG capsule Take 4-6 mg by mouth as needed for diarrhea or loose stools.   metFORMIN (GLUCOPHAGE) 500 MG tablet Take 500 mg by mouth 2 (two) times daily with a  meal.    Misc Natural Products (GLUCOSAMINE CHONDROITIN TRIPLE) TABS Take 1 tablet by mouth 2 (two) times daily.   mometasone (ELOCON) 0.1 % cream Apply 1 application topically as needed (rash).   Multiple Vitamins-Minerals (CENTRUM SILVER) CHEW 2 soft chews by mouth daily   pantoprazole (PROTONIX) 40 MG tablet Take 40 mg by mouth daily.   potassium chloride (K-DUR,KLOR-CON) 10 MEQ tablet TAKE 2 TABLETS BY MOUTH TWICE DAILY   psyllium (METAMUCIL SMOOTH TEXTURE) 28 % packet Take 1 packet by mouth daily as needed (stomach upset).    rivaroxaban (XARELTO) 20 MG TABS tablet Take 1 tablet (20 mg total) by mouth every evening.   simvastatin (ZOCOR) 10 MG tablet Take 1 tablet (10 mg total) by mouth at bedtime.   sotalol (BETAPACE) 80 MG tablet TAKE 1 TABLET(80 MG) BY MOUTH EVERY 12 HOURS   spironolactone (ALDACTONE) 25 MG tablet Take 0.5 tablets  (12.5 mg total) by mouth daily.   tiZANidine (ZANAFLEX) 2 MG tablet Take 2 mg by mouth every 8 (eight) hours as needed for muscle spasms.    triamcinolone cream (KENALOG) 0.5 % Apply 1 application topically as needed (itching).     Allergies:   No healthtouch food allergies   Social History   Socioeconomic History   Marital status: Divorced    Spouse name: Not on file   Number of children: Not on file   Years of education: Not on file   Highest education level: Not on file  Occupational History   Not on file  Social Needs   Financial resource strain: Not on file   Food insecurity    Worry: Not on file    Inability: Not on file   Transportation needs    Medical: Not on file    Non-medical: Not on file  Tobacco Use   Smoking status: Never Smoker   Smokeless tobacco: Never Used  Substance and Sexual Activity   Alcohol use: Yes    Comment: 09/21/2016 "might have a glass of wine once/year; if that"   Drug use: No   Sexual activity: Never  Lifestyle   Physical activity    Days per week: Not on file    Minutes per session: Not on file   Stress: Not on file  Relationships   Social connections    Talks on phone: Not on file    Gets together: Not on file    Attends religious service: Not on file    Active member of club or organization: Not on file    Attends meetings of clubs or organizations: Not on file    Relationship status: Not on file  Other Topics Concern   Not on file  Social History Narrative   Not on file     Family History: The patient's family history includes CAD in her mother; Cancer in her father; Diabetes in her brother and father; Heart failure in her father; Hypertension in her brother. There is no history of Breast cancer.  ROS:   Please see the history of present illness.    All other systems reviewed and are negative.  EKGs/Labs/Other Studies Reviewed:    The following studies were reviewed today:  Echocardiogram  05/26/2016: Study Conclusions: - Left ventricle: The cavity size was normal. There was moderate   concentric hypertrophy. Systolic function was mildly reduced. The   estimated ejection fraction was in the range of 45% to 50%.   Diffuse hypokinesis. - Aortic valve: Transvalvular velocity was within the normal range.  There was no stenosis. There was no regurgitation. - Mitral valve: Transvalvular velocity was within the normal range.   There was no evidence for stenosis. There was no regurgitation. - Right ventricle: The cavity size was normal. Wall thickness was   normal. Systolic function was normal. - Atrial septum: No defect or patent foramen ovale was identified. - Tricuspid valve: There was trivial regurgitation. - Pulmonary arteries: Systolic pressure was within the normal   range. PA peak pressure: 25 mm Hg (S). _______________  Myoview 06/01/2016:  There was no ST segment deviation noted during stress.  Defect 1: There is a medium defect of moderate severity present in the basal anteroseptal, basal inferior, mid inferior and apical inferior location.  This is a low risk study.   Low risk stress nuclear study with fixed defects in the basal septum and inferior walls (attenuation vs infarct); no ischemia; study not gated due to atrial fibrillation; suggest echo to assess LV function. No significant changes compared to prior tracings. _______________  Coronary CT 09/24/2016: Impression: 1) Calcium score 0 2) Normal right dominant coronary arteries 3) Dilated main PA 31 mm compared to aortic root 28 mm 4) Cannot r/o LAA thrombus but more likely poor mixing of contrast _______________  EKG:  EKG is ordered today. EKG personally reviewed and demonstrates normal sinus rhythm, rate 71 bpm, with LAFB, borderline LVH, and QTc of 465 ms. No significant changes compared to prior tracings.  Recent Labs: 04/04/2018: Hemoglobin 13.6; Platelets 260  Recent Lipid Panel No results found  for: CHOL, TRIG, HDL, CHOLHDL, VLDL, LDLCALC, LDLDIRECT  Physical Exam:    Vital Signs:  BP 126/72    Pulse 71    Ht _0  (1.575 m)    SpO2 96%    BMI 51.42 kg/m     Wt Readings from Last 3 Encounters:  04/04/18 281 lb 1.9 oz (127.5 kg)  11/17/17 270 lb (122.5 kg)  11/02/17 284 lb (128.8 kg)    General: 73 y.o. morbidly obese Caucasian female resting comfortably in no acute distress. HEENT: Normocephalic and atraumatic. Sclera clear. EOMs intact. Neck: Supple. No carotid bruits. Difficult to assess JVD due to body habitus. Heart: RRR. Distinct S1 and S2. No significant murmurs, gallops, or rubs. Radial pulses and posterior tibial pulses 2+ and equal bilaterally. Lungs: No increased work of breathing. Clear to ausculation bilaterally. No wheezes, rhonchi, or rales.  Abdomen: Soft, non-distended, and non-tender to palpation. Bowel sounds present. MSK: Normal strength and tone for age. Extremities: Mild nonpitting lower extremities (left slightly greater than right). Skin: Warm and dry. One area on posterior medial left calf that is slightly indurated and erythematous but not warm to the touch. Another similar area on more anterior side of lower leg that soft and less erythematous. Neuro: Alert and oriented x3. No focal deficits. Psych: Normal affect. Responds appropriately.  ASSESSMENT:    1. Paroxysmal atrial fibrillation (HCC)   2. Chronic diastolic CHF (congestive heart failure) ([place])   3. Benign essential HTN   4. Type 2 diabetes mellitus without complication, without long-term current use of insulin ([place])   5. Lower extremity edema   6. Obstructive sleep apnea   7. Atypical chest pain   8. Hyperlipidemia, unspecified hyperlipidemia type    PLAN:    Paroxysmal Atrial Fibrillation Maintaining sinus rhythm. EKG shows normal sinus rhythm, rate 71 bpm, with LAFB, borderline LVH, and stable QTc of 465 ms. No significant changes from prior tracings. Continue Sotalol 15m twice  daily and  Coreg 3.117m twice daily. Continue Xarelto 255mdaily. Will check CBC, BMET, and Mg.  Chronic Combined CHF/Non-Ischemic Cardiomyopathy Most recent Echo from 05/2016 showed LVEF of 45-50% with moderate LVH and diffuse hypokinesis. Appears well compensated. Patient continues to have chronic dyspnea on exertion and some lower extremity edema but does not appear significantly volume overloaded on exam. Continue Coreg 3.12588mwice daily. Continue Spironolactone 12.5mg52mily. Patient also on K-Dur 20mg39mly with this. If lower extremity swelling worsens, patient will call office and can increase Spironolactone to 25mg 44my.  Lower Extremity Edema Patient has chronic lower extremity edema. Suspect secondary to morbid obesity and more sedentary lifestyle. She states left lower extremity is always larger than the right. However, [place] and [place] noticed her left leg was significantly larger than her right. She also noticed an area on her left calf that was hard and red. On exam, she does have and mildly erythematous/indurated area on left posterior medial lower extremity. Not warm to the touch. Patient is on Xarelto so DVT would be very unlikely but patient is also more sedentary so will check STAT D-dimer to rule out DVT. Patient afebrile and she said redness is improving so do not think patient needs any antibiotics for cellulitis at this time. However, told patient to keep and eye on this and if redness worsens or area becomes painful to touch/warm or she develops a fever to call her PCP.  Atypical Chest Discomfort Patient reports occasional substernal non-radiating sharp chest pain that last for about 1 minute and then resolves independently. She has not had this pain in a while. No exertional pain. Patient thinks it is due to her hiatal hernia. Presentation atypical. Coronary CT in 09/2016 showed calcium score of 0 and clear coronaries. Do not feel like  this is cardiac chest pain. Advised patient to let us knoKoreaif she has exertional chest pain.  Hypertension BP well controlled at 126/72. Continue Coreg and Spironolactone as above. Given slightly reduced EF and diabetes, patient would benefit from ACEi/ARB. If potassium and renal function are okay, will add Lisinopril 5mg da48m and recheck BMET in 1 week.   Obstructive Sleep Apnea on CPAP Patient compliant with CPAP. Followed by Dr. Turner.Radford Paxneed annual sleep clinic visit with her in 03/2019.  Diabetes Mellitus Patient on Metformin 500mg tw51mdaily and Glimepiride 8mg dail34mHemoglobin A1c 7.4 in 12/2017. Per patient's request, will go ahead and recheck today with blood draw but will defer management to PCP.  Hyperlipidemia LDL 65 in 12/2017 (from [facility]). [facility] Simvastatin 10mg dail90m Morbid Obesity Patient bought a gym membership but then COVID hit.[place], she is in the process of getting an EllipticalVisual merchandiserhe can stay active. She feels uncomfortably going to the gym with COVID and asked for a letter to get her out of her gym membership. Provided her a letter that given her medical conditions she is at a higher risk than the general population.   Disposition: Follow-up with MD or APP in 6 months.   Medication Adjustments/Labs and Tests Ordered: Current medicines are reviewed at length with the patient today.  Concerns regarding medicines are outlined above.  Orders Placed This Encounter  Procedures   Comp Met (CMET)   CBC   D-Dimer, Quantitative   Magnesium   HgB A1c   EKG 12-Lead   No orders of the defined types were placed in this encounter.  Patient Instructions  Medication Instructions:  Your physician recommends that you continue on your current medications as directed. Please refer to the Current Medication list given to you today.  If you need a refill on your cardiac medications before your next appointment, please call your pharmacy.    Lab work: TODAY:  CMET, MAG, CBC, HGBA1C & STAT DDIMER   If you have labs (blood work) drawn today and your tests are completely normal, you will receive your results only by:  [place] (if you have MyChart) OR  A paper copy in the mail If you have any lab test that is abnormal or we need to change your treatment, we will call you to review the results.  Testing/Procedures: None ordered  Follow-Up: At [facility], you and your health needs are our priority.  As part of our continuing mission to provide you with exceptional heart care, we have created designated Provider Care Teams.  These Care Teams include your primary Cardiologist (physician) and Advanced Practice Providers (APPs -  Physician Assistants and Nurse Practitioners) who all work together to provide you with the care you need, when you need it. You will need a follow up appointment in 6 months.  Please call our office 2 months in advance to schedule this appointment.  You may see Fransico Him, MD or one of the following Advanced Practice Providers on your designated Care Team:   Lyda Jester, PA-C Dayna Dunn, PA-C  Ermalinda Barrios, PA-C  Any Other Special Instructions Will Be Listed Below (If Applicable).       Signed, Darreld Mclean, PA-C  10/20/2018 2:56 PM    [facility]

## 2018-10-20 ENCOUNTER — Encounter: Payer: Self-pay | Admitting: Physician Assistant

## 2018-10-20 ENCOUNTER — Ambulatory Visit (INDEPENDENT_AMBULATORY_CARE_PROVIDER_SITE_OTHER): Payer: Medicare Other | Admitting: Student

## 2018-10-20 ENCOUNTER — Other Ambulatory Visit: Payer: Self-pay

## 2018-10-20 ENCOUNTER — Encounter: Payer: Self-pay | Admitting: *Deleted

## 2018-10-20 VITALS — BP 126/72 | HR 71 | Ht 62.0 in

## 2018-10-20 DIAGNOSIS — E119 Type 2 diabetes mellitus without complications: Secondary | ICD-10-CM

## 2018-10-20 DIAGNOSIS — R600 Localized edema: Secondary | ICD-10-CM | POA: Diagnosis not present

## 2018-10-20 DIAGNOSIS — E785 Hyperlipidemia, unspecified: Secondary | ICD-10-CM | POA: Diagnosis not present

## 2018-10-20 DIAGNOSIS — G4733 Obstructive sleep apnea (adult) (pediatric): Secondary | ICD-10-CM | POA: Diagnosis not present

## 2018-10-20 DIAGNOSIS — R0789 Other chest pain: Secondary | ICD-10-CM

## 2018-10-20 DIAGNOSIS — I5032 Chronic diastolic (congestive) heart failure: Secondary | ICD-10-CM | POA: Diagnosis not present

## 2018-10-20 DIAGNOSIS — I480 Paroxysmal atrial fibrillation: Secondary | ICD-10-CM | POA: Diagnosis not present

## 2018-10-20 DIAGNOSIS — I10 Essential (primary) hypertension: Secondary | ICD-10-CM

## 2018-10-20 DIAGNOSIS — I4819 Other persistent atrial fibrillation: Secondary | ICD-10-CM | POA: Diagnosis not present

## 2018-10-20 LAB — D-DIMER, QUANTITATIVE: D-DIMER: <0.2 mg/L FEU (ref 0.00–0.49)

## 2018-10-20 NOTE — Patient Instructions (Addendum)
Medication Instructions:  Your physician recommends that you continue on your current medications as directed. Please refer to the Current Medication list given to you today.  If you need a refill on your cardiac medications before your next appointment, please call your pharmacy.   Lab work: TODAY:  CMET, MAG, CBC, HGBA1C & STAT DDIMER   If you have labs (blood work) drawn today and your tests are completely normal, you will receive your results only by: [place] MyChart Message (if you have MyChart) OR . A paper copy in the mail If you have any lab test that is abnormal or we need to change your treatment, we will call you to review the results.  Testing/Procedures: None ordered  Follow-Up: At [facility], you and your health needs are our priority.  As part of our continuing mission to provide you with exceptional heart care, we have created designated Provider Care Teams.  These Care Teams include your primary Cardiologist (physician) and Advanced Practice Providers (APPs -  Physician Assistants and Nurse Practitioners) who all work together to provide you with the care you need, when you need it. You will need a follow up appointment in 6 months.  Please call our office 2 months in advance to schedule this appointment.  You may see Fransico Him, MD or one of the following Advanced Practice Providers on your designated Care Team:   [place], PA-C Melina Copa, PA-C . Ermalinda Barrios, PA-C  Any Other Special Instructions Will Be Listed Below (If Applicable).

## 2018-10-21 LAB — COMPREHENSIVE METABOLIC PANEL
ALT: 28 IU/L (ref 0–32)
AST: 18 IU/L (ref 0–40)
Albumin/Globulin Ratio: 1.8 (ref 1.2–2.2)
Albumin: 4.2 g/dL (ref 3.8–4.8)
Alkaline Phosphatase: 94 IU/L (ref 39–117)
BUN/Creatinine Ratio: 16 (ref 12–28)
BUN: 14 mg/dL (ref 8–27)
Bilirubin Total: 0.6 mg/dL (ref 0.0–1.2)
CO2: 25 mmol/L (ref 20–29)
Calcium: 9.6 mg/dL (ref 8.7–10.3)
Chloride: 97 mmol/L (ref 96–106)
Creatinine, Ser: 0.9 mg/dL (ref 0.57–1.00)
GFR calc Af Amer: 75 mL/min/{1.73_m2} (ref >59)
GFR calc non Af Amer: 65 mL/min/{1.73_m2} (ref >59)
Globulin, Total: 2.3 g/dL (ref 1.5–4.5)
Glucose: 348 mg/dL — ABNORMAL HIGH (ref 65–99)
Potassium: 4.2 mmol/L (ref 3.5–5.2)
Sodium: 138 mmol/L (ref 134–144)
Total Protein: 6.5 g/dL (ref 6.0–8.5)

## 2018-10-21 LAB — CBC
Hematocrit: 46.3 % (ref 34.0–46.6)
Hemoglobin: 13.9 g/dL (ref 11.1–15.9)
MCH: 28.1 pg (ref 26.6–33.0)
MCHC: 30 g/dL — ABNORMAL LOW (ref 31.5–35.7)
MCV: 94 fL (ref 79–97)
Platelets: 261 10*3/uL (ref 150–450)
RBC: 4.94 x10E6/uL (ref 3.77–5.28)
RDW: 13 % (ref 11.7–15.4)
WBC: 9 10*3/uL (ref 3.4–10.8)

## 2018-10-21 LAB — HEMOGLOBIN A1C
Est. average glucose Bld gHb Est-mCnc: 240 mg/dL
Hgb A1c MFr Bld: 10 % — ABNORMAL HIGH (ref 4.8–5.6)

## 2018-10-21 LAB — MAGNESIUM: Magnesium: 2.2 mg/dL (ref 1.6–2.3)

## 2018-10-24 ENCOUNTER — Telehealth: Payer: Self-pay

## 2018-10-24 NOTE — Telephone Encounter (Signed)
-----   Message from Darreld Mclean, [place] sent at 10/23/2018  7:28 AM EDT ----- Also, her BP was OK at our visit. But if patient is okay with it, I would like to add Lisinopril 5mg  daily given slightly reduced pumping function of heart on last Echo and her diabetes. She will then need repeat BMET in 1 week. And can we see if she has a way to monitor her BP at home?  Thank you.

## 2018-10-24 NOTE — Telephone Encounter (Signed)
Notes recorded by Frederik Schmidt, RN on 10/24/2018 at 8:35 AM EDT  Discussed conversation between [facility], [place] and patient. Patient verbalized understanding.  ------

## 2018-10-30 ENCOUNTER — Other Ambulatory Visit: Payer: Self-pay | Admitting: Cardiology

## 2018-10-31 DIAGNOSIS — Z7984 Long term (current) use of oral hypoglycemic drugs: Secondary | ICD-10-CM | POA: Diagnosis not present

## 2018-10-31 DIAGNOSIS — L989 Disorder of the skin and subcutaneous tissue, unspecified: Secondary | ICD-10-CM | POA: Diagnosis not present

## 2018-10-31 DIAGNOSIS — E119 Type 2 diabetes mellitus without complications: Secondary | ICD-10-CM | POA: Diagnosis not present

## 2018-11-28 DIAGNOSIS — E119 Type 2 diabetes mellitus without complications: Secondary | ICD-10-CM | POA: Diagnosis not present

## 2018-12-06 DIAGNOSIS — I4891 Unspecified atrial fibrillation: Secondary | ICD-10-CM | POA: Diagnosis not present

## 2018-12-06 DIAGNOSIS — M160 Bilateral primary osteoarthritis of hip: Secondary | ICD-10-CM | POA: Diagnosis not present

## 2018-12-06 DIAGNOSIS — E119 Type 2 diabetes mellitus without complications: Secondary | ICD-10-CM | POA: Diagnosis not present

## 2018-12-06 DIAGNOSIS — I119 Hypertensive heart disease without heart failure: Secondary | ICD-10-CM | POA: Diagnosis not present

## 2018-12-06 DIAGNOSIS — E7800 Pure hypercholesterolemia, unspecified: Secondary | ICD-10-CM | POA: Diagnosis not present

## 2018-12-06 DIAGNOSIS — M1712 Unilateral primary osteoarthritis, left knee: Secondary | ICD-10-CM | POA: Diagnosis not present

## 2018-12-06 DIAGNOSIS — M1711 Unilateral primary osteoarthritis, right knee: Secondary | ICD-10-CM | POA: Diagnosis not present

## 2018-12-06 DIAGNOSIS — F329 Major depressive disorder, single episode, unspecified: Secondary | ICD-10-CM | POA: Diagnosis not present

## 2018-12-06 DIAGNOSIS — I10 Essential (primary) hypertension: Secondary | ICD-10-CM | POA: Diagnosis not present

## 2018-12-06 DIAGNOSIS — E1169 Type 2 diabetes mellitus with other specified complication: Secondary | ICD-10-CM | POA: Diagnosis not present

## 2018-12-06 DIAGNOSIS — M810 Age-related osteoporosis without current pathological fracture: Secondary | ICD-10-CM | POA: Diagnosis not present

## 2018-12-15 DIAGNOSIS — I8311 Varicose veins of right lower extremity with inflammation: Secondary | ICD-10-CM | POA: Diagnosis not present

## 2018-12-15 DIAGNOSIS — I872 Venous insufficiency (chronic) (peripheral): Secondary | ICD-10-CM | POA: Diagnosis not present

## 2018-12-15 DIAGNOSIS — I8312 Varicose veins of left lower extremity with inflammation: Secondary | ICD-10-CM | POA: Diagnosis not present

## 2018-12-15 DIAGNOSIS — L570 Actinic keratosis: Secondary | ICD-10-CM | POA: Diagnosis not present

## 2018-12-28 ENCOUNTER — Other Ambulatory Visit: Payer: Self-pay | Admitting: Family Medicine

## 2018-12-28 ENCOUNTER — Telehealth: Payer: Self-pay | Admitting: Cardiology

## 2018-12-28 DIAGNOSIS — R600 Localized edema: Secondary | ICD-10-CM

## 2018-12-28 DIAGNOSIS — Z1231 Encounter for screening mammogram for malignant neoplasm of breast: Secondary | ICD-10-CM

## 2018-12-28 NOTE — Telephone Encounter (Signed)
New message    Patient calling to report rash on both  Legs. Dr Amy Martinique advised her  it is a vein issue  Patient has questions regarding circulation in her legs. Patient is wearing support hose but has additional concerns

## 2018-12-29 NOTE — Telephone Encounter (Signed)
Patient calling and states that she continues to have blotches, red patches, swelling, and  hard places on both of her legs. Seen Sande Rives, PA on 9/11 and D-Dimer was negative. Patient is on Xarelto. Patient has seen, PCP, Dr. Drema Dallas and no changes were made. She called the [facility] and they told her that they could not do anything if the places were not open and draining. She saw Dr Martinique, dermatologist and was told that it was dermatitis. She has been wearing compression stockings and will see dermatologist again on Jan 6. Patient has questions regarding whether she has circulation problems and states that Sande Rives, [place] mentioned a medicine to help with that. I do not see an [place] on file. Will forward to Dr. Radford Pax for review and recommendation.

## 2018-12-29 NOTE — Telephone Encounter (Signed)
She had mentioned antibiotics but she did not think is was infected.  Please order LE arterial dopplers to rule out PAD.  I would like her to followup with Dr. Martinique and I will let her know if dopplers are abnormal

## 2018-12-29 NOTE — Telephone Encounter (Signed)
I spoke with pt and gave her information from Dr Radford Pax. Pt made aware doppler studies are done at [facility] office and that she would receive a call with appointment information.

## 2019-01-01 ENCOUNTER — Other Ambulatory Visit (HOSPITAL_COMMUNITY): Payer: Self-pay | Admitting: Cardiology

## 2019-01-01 DIAGNOSIS — I739 Peripheral vascular disease, unspecified: Secondary | ICD-10-CM

## 2019-01-03 ENCOUNTER — Telehealth: Payer: Self-pay | Admitting: Cardiology

## 2019-01-03 ENCOUNTER — Other Ambulatory Visit: Payer: Self-pay

## 2019-01-03 ENCOUNTER — Ambulatory Visit (HOSPITAL_COMMUNITY)
Admission: RE | Admit: 2019-01-03 | Discharge: 2019-01-03 | Disposition: A | Discharge status: Home / Self Care | Payer: Medicare Other | Source: Ambulatory Visit | Attending: Cardiovascular Disease | Admitting: Cardiovascular Disease

## 2019-01-03 DIAGNOSIS — I739 Peripheral vascular disease, unspecified: Secondary | ICD-10-CM | POA: Diagnosis not present

## 2019-01-03 DIAGNOSIS — R600 Localized edema: Secondary | ICD-10-CM | POA: Diagnosis not present

## 2019-01-03 NOTE — Telephone Encounter (Signed)
Patient states per her appt today for her LE Arterial, she was recommended she go see [facility]. Patient states she will need a referral sent to them before she can make an appt.

## 2019-01-03 NOTE — Telephone Encounter (Signed)
Called patient back about her message. Patient stated the tech who did her study told her she would need a referral to [facility] for her varicose veins. Patient would like referral as soon as possible. Patient complained of pain in her BLE due to the varicose veins. Informed patient that Dr. Radford Pax will need to review her study and Dr. Radford Pax would need to be the one to refer her if she thinks it is appropriate. Informed patient that she would hear about her results the beginning of next week, due to office being closed for the holiday. Patient verbalized understanding. Will forward to Dr. Radford Pax for advisement.

## 2019-01-03 NOTE — Telephone Encounter (Signed)
Follow Up:   Pt said [facility] said it depend on what Dr Radford Pax says is how soon she can be seen. SHe says she need this asp please, this problem have been going since September.   Pt also said she was not getting  Exercise , so she order a CRS [address]. It will be delivered next week.

## 2019-01-05 NOTE — Telephone Encounter (Signed)
I am fine with referral to [facility] for her varicose veins

## 2019-01-08 ENCOUNTER — Telehealth: Payer: Self-pay

## 2019-01-08 DIAGNOSIS — I83893 Varicose veins of bilateral lower extremities with other complications: Secondary | ICD-10-CM

## 2019-01-08 NOTE — Telephone Encounter (Signed)
-----   Message from [name], MD sent at 01/05/2019 10:03 AM EST ----- No evidence of significant LE arterial disease.  Please refer to [facility] for evaluation of varicose veins

## 2019-01-08 NOTE — Telephone Encounter (Signed)
The patient has been notified of the result and verbalized understanding.  All questions (if any) were answered. Wilma Flavin, RN 01/08/2019 8:22 AM    Pt referral to [facility] sent.

## 2019-01-12 ENCOUNTER — Emergency Department (HOSPITAL_COMMUNITY): Payer: Medicare Other

## 2019-01-12 ENCOUNTER — Other Ambulatory Visit: Payer: Self-pay

## 2019-01-12 ENCOUNTER — Inpatient Hospital Stay (HOSPITAL_COMMUNITY)
Admission: EM | Admit: 2019-01-12 | Discharge: 2019-01-20 | DRG: 659 | Disposition: A | Discharge status: Home Health | Payer: Medicare Other | Attending: Internal Medicine | Admitting: Internal Medicine

## 2019-01-12 ENCOUNTER — Encounter (HOSPITAL_COMMUNITY): Payer: Self-pay

## 2019-01-12 DIAGNOSIS — K5730 Diverticulosis of large intestine without perforation or abscess without bleeding: Secondary | ICD-10-CM | POA: Diagnosis present

## 2019-01-12 DIAGNOSIS — I420 Dilated cardiomyopathy: Secondary | ICD-10-CM | POA: Diagnosis present

## 2019-01-12 DIAGNOSIS — R0602 Shortness of breath: Secondary | ICD-10-CM | POA: Diagnosis not present

## 2019-01-12 DIAGNOSIS — Z7901 Long term (current) use of anticoagulants: Secondary | ICD-10-CM

## 2019-01-12 DIAGNOSIS — Z809 Family history of malignant neoplasm, unspecified: Secondary | ICD-10-CM

## 2019-01-12 DIAGNOSIS — D631 Anemia in chronic kidney disease: Secondary | ICD-10-CM | POA: Diagnosis present

## 2019-01-12 DIAGNOSIS — N1832 Chronic kidney disease, stage 3b: Secondary | ICD-10-CM | POA: Diagnosis present

## 2019-01-12 DIAGNOSIS — R778 Other specified abnormalities of plasma proteins: Secondary | ICD-10-CM | POA: Diagnosis not present

## 2019-01-12 DIAGNOSIS — M160 Bilateral primary osteoarthritis of hip: Secondary | ICD-10-CM | POA: Diagnosis present

## 2019-01-12 DIAGNOSIS — E871 Hypo-osmolality and hyponatremia: Secondary | ICD-10-CM | POA: Diagnosis present

## 2019-01-12 DIAGNOSIS — Z6841 Body Mass Index (BMI) 40.0 and over, adult: Secondary | ICD-10-CM

## 2019-01-12 DIAGNOSIS — I130 Hypertensive heart and chronic kidney disease with heart failure and stage 1 through stage 4 chronic kidney disease, or unspecified chronic kidney disease: Secondary | ICD-10-CM | POA: Diagnosis present

## 2019-01-12 DIAGNOSIS — E1165 Type 2 diabetes mellitus with hyperglycemia: Secondary | ICD-10-CM | POA: Diagnosis not present

## 2019-01-12 DIAGNOSIS — I5032 Chronic diastolic (congestive) heart failure: Secondary | ICD-10-CM | POA: Diagnosis present

## 2019-01-12 DIAGNOSIS — E872 Acidosis: Secondary | ICD-10-CM | POA: Diagnosis present

## 2019-01-12 DIAGNOSIS — N200 Calculus of kidney: Secondary | ICD-10-CM

## 2019-01-12 DIAGNOSIS — E860 Dehydration: Secondary | ICD-10-CM | POA: Diagnosis present

## 2019-01-12 DIAGNOSIS — Z7984 Long term (current) use of oral hypoglycemic drugs: Secondary | ICD-10-CM

## 2019-01-12 DIAGNOSIS — I10 Essential (primary) hypertension: Secondary | ICD-10-CM | POA: Diagnosis present

## 2019-01-12 DIAGNOSIS — N136 Pyonephrosis: Secondary | ICD-10-CM | POA: Diagnosis not present

## 2019-01-12 DIAGNOSIS — Z8719 Personal history of other diseases of the digestive system: Secondary | ICD-10-CM | POA: Diagnosis not present

## 2019-01-12 DIAGNOSIS — I4819 Other persistent atrial fibrillation: Secondary | ICD-10-CM | POA: Diagnosis present

## 2019-01-12 DIAGNOSIS — I248 Other forms of acute ischemic heart disease: Secondary | ICD-10-CM | POA: Diagnosis not present

## 2019-01-12 DIAGNOSIS — Z7983 Long term (current) use of bisphosphonates: Secondary | ICD-10-CM

## 2019-01-12 DIAGNOSIS — G4733 Obstructive sleep apnea (adult) (pediatric): Secondary | ICD-10-CM | POA: Diagnosis present

## 2019-01-12 DIAGNOSIS — E785 Hyperlipidemia, unspecified: Secondary | ICD-10-CM | POA: Diagnosis present

## 2019-01-12 DIAGNOSIS — N390 Urinary tract infection, site not specified: Secondary | ICD-10-CM | POA: Diagnosis not present

## 2019-01-12 DIAGNOSIS — Z20822 Contact with and (suspected) exposure to covid-19: Secondary | ICD-10-CM

## 2019-01-12 DIAGNOSIS — R0902 Hypoxemia: Secondary | ICD-10-CM | POA: Diagnosis not present

## 2019-01-12 DIAGNOSIS — E1100 Type 2 diabetes mellitus with hyperosmolarity without nonketotic hyperglycemic-hyperosmolar coma (NKHHC): Secondary | ICD-10-CM | POA: Diagnosis not present

## 2019-01-12 DIAGNOSIS — Z8249 Family history of ischemic heart disease and other diseases of the circulatory system: Secondary | ICD-10-CM

## 2019-01-12 DIAGNOSIS — R1310 Dysphagia, unspecified: Secondary | ICD-10-CM | POA: Diagnosis not present

## 2019-01-12 DIAGNOSIS — R531 Weakness: Secondary | ICD-10-CM | POA: Diagnosis not present

## 2019-01-12 DIAGNOSIS — K219 Gastro-esophageal reflux disease without esophagitis: Secondary | ICD-10-CM | POA: Diagnosis not present

## 2019-01-12 DIAGNOSIS — Z20828 Contact with and (suspected) exposure to other viral communicable diseases: Secondary | ICD-10-CM | POA: Diagnosis present

## 2019-01-12 DIAGNOSIS — N179 Acute kidney failure, unspecified: Secondary | ICD-10-CM | POA: Diagnosis not present

## 2019-01-12 DIAGNOSIS — Z1612 Extended spectrum beta lactamase (ESBL) resistance: Secondary | ICD-10-CM | POA: Diagnosis present

## 2019-01-12 DIAGNOSIS — Z833 Family history of diabetes mellitus: Secondary | ICD-10-CM

## 2019-01-12 DIAGNOSIS — N10 Acute tubulo-interstitial nephritis: Secondary | ICD-10-CM | POA: Diagnosis present

## 2019-01-12 DIAGNOSIS — K3189 Other diseases of stomach and duodenum: Secondary | ICD-10-CM | POA: Diagnosis not present

## 2019-01-12 DIAGNOSIS — N993 Prolapse of vaginal vault after hysterectomy: Secondary | ICD-10-CM | POA: Diagnosis present

## 2019-01-12 DIAGNOSIS — M170 Bilateral primary osteoarthritis of knee: Secondary | ICD-10-CM | POA: Diagnosis present

## 2019-01-12 DIAGNOSIS — R7989 Other specified abnormal findings of blood chemistry: Secondary | ICD-10-CM | POA: Diagnosis present

## 2019-01-12 DIAGNOSIS — B9620 Unspecified Escherichia coli [E. coli] as the cause of diseases classified elsewhere: Secondary | ICD-10-CM | POA: Diagnosis present

## 2019-01-12 DIAGNOSIS — R1032 Left lower quadrant pain: Secondary | ICD-10-CM | POA: Diagnosis present

## 2019-01-12 DIAGNOSIS — E876 Hypokalemia: Secondary | ICD-10-CM | POA: Diagnosis present

## 2019-01-12 DIAGNOSIS — I959 Hypotension, unspecified: Secondary | ICD-10-CM | POA: Diagnosis not present

## 2019-01-12 DIAGNOSIS — B9629 Other Escherichia coli [E. coli] as the cause of diseases classified elsewhere: Secondary | ICD-10-CM | POA: Diagnosis present

## 2019-01-12 DIAGNOSIS — E7800 Pure hypercholesterolemia, unspecified: Secondary | ICD-10-CM | POA: Diagnosis present

## 2019-01-12 DIAGNOSIS — E1122 Type 2 diabetes mellitus with diabetic chronic kidney disease: Secondary | ICD-10-CM | POA: Diagnosis present

## 2019-01-12 LAB — BASIC METABOLIC PANEL
Anion gap: 11 (ref 5–15)
BUN: 32 mg/dL — ABNORMAL HIGH (ref 8–23)
CO2: 19 mmol/L — ABNORMAL LOW (ref 22–32)
Calcium: 8.2 mg/dL — ABNORMAL LOW (ref 8.9–10.3)
Chloride: 101 mmol/L (ref 98–111)
Creatinine, Ser: 1.81 mg/dL — ABNORMAL HIGH (ref 0.44–1.00)
GFR calc Af Amer: 32 mL/min — ABNORMAL LOW (ref >60)
GFR calc non Af Amer: 28 mL/min — ABNORMAL LOW (ref >60)
Glucose, Bld: 465 mg/dL — ABNORMAL HIGH (ref 70–99)
Potassium: 4.6 mmol/L (ref 3.5–5.1)
Sodium: 131 mmol/L — ABNORMAL LOW (ref 135–145)

## 2019-01-12 LAB — CBC
HCT: 37.3 % (ref 36.0–46.0)
Hemoglobin: 11.8 g/dL — ABNORMAL LOW (ref 12.0–15.0)
MCH: 28.6 pg (ref 26.0–34.0)
MCHC: 31.6 g/dL (ref 30.0–36.0)
MCV: 90.3 fL (ref 80.0–100.0)
Platelets: 228 10*3/uL (ref 150–400)
RBC: 4.13 MIL/uL (ref 3.87–5.11)
RDW: 13.5 % (ref 11.5–15.5)
WBC: 14.6 10*3/uL — ABNORMAL HIGH (ref 4.0–10.5)
nRBC: 0 % (ref 0.0–0.2)

## 2019-01-12 LAB — CBG MONITORING, ED: Glucose-Capillary: 412 mg/dL — ABNORMAL HIGH (ref 70–99)

## 2019-01-12 NOTE — ED Triage Notes (Signed)
Pt BIB [facility] from home with multiple complaints including weakness, SHOB, back pain and Hyperglycemia. Pt stated that she was feeling shaky so she ate some sugary snack, CBG with EMS was 525. Pt is also c/o weakness, dry cough and night sweats x1 week. Pt also experienced 1 episode of diarrhea today. Pt denies N/V, loss of taste or smell, sore throat, congestion, headache or contact with COVID.

## 2019-01-13 ENCOUNTER — Emergency Department (HOSPITAL_COMMUNITY): Payer: Medicare Other

## 2019-01-13 ENCOUNTER — Encounter (HOSPITAL_COMMUNITY): Payer: Self-pay | Admitting: Emergency Medicine

## 2019-01-13 ENCOUNTER — Inpatient Hospital Stay (HOSPITAL_COMMUNITY): Payer: Medicare Other

## 2019-01-13 DIAGNOSIS — I248 Other forms of acute ischemic heart disease: Secondary | ICD-10-CM | POA: Diagnosis not present

## 2019-01-13 DIAGNOSIS — N179 Acute kidney failure, unspecified: Secondary | ICD-10-CM | POA: Diagnosis not present

## 2019-01-13 DIAGNOSIS — I5032 Chronic diastolic (congestive) heart failure: Secondary | ICD-10-CM | POA: Diagnosis present

## 2019-01-13 DIAGNOSIS — Z7984 Long term (current) use of oral hypoglycemic drugs: Secondary | ICD-10-CM | POA: Diagnosis not present

## 2019-01-13 DIAGNOSIS — M170 Bilateral primary osteoarthritis of knee: Secondary | ICD-10-CM | POA: Diagnosis present

## 2019-01-13 DIAGNOSIS — E7800 Pure hypercholesterolemia, unspecified: Secondary | ICD-10-CM | POA: Diagnosis present

## 2019-01-13 DIAGNOSIS — N189 Chronic kidney disease, unspecified: Secondary | ICD-10-CM

## 2019-01-13 DIAGNOSIS — E1100 Type 2 diabetes mellitus with hyperosmolarity without nonketotic hyperglycemic-hyperosmolar coma (NKHHC): Secondary | ICD-10-CM | POA: Diagnosis present

## 2019-01-13 DIAGNOSIS — I10 Essential (primary) hypertension: Secondary | ICD-10-CM | POA: Diagnosis not present

## 2019-01-13 DIAGNOSIS — Z20828 Contact with and (suspected) exposure to other viral communicable diseases: Secondary | ICD-10-CM

## 2019-01-13 DIAGNOSIS — N8110 Cystocele, unspecified: Secondary | ICD-10-CM | POA: Diagnosis not present

## 2019-01-13 DIAGNOSIS — R531 Weakness: Secondary | ICD-10-CM

## 2019-01-13 DIAGNOSIS — M160 Bilateral primary osteoarthritis of hip: Secondary | ICD-10-CM | POA: Diagnosis present

## 2019-01-13 DIAGNOSIS — R0602 Shortness of breath: Secondary | ICD-10-CM | POA: Diagnosis not present

## 2019-01-13 DIAGNOSIS — N135 Crossing vessel and stricture of ureter without hydronephrosis: Secondary | ICD-10-CM | POA: Diagnosis not present

## 2019-01-13 DIAGNOSIS — G4733 Obstructive sleep apnea (adult) (pediatric): Secondary | ICD-10-CM | POA: Diagnosis present

## 2019-01-13 DIAGNOSIS — I420 Dilated cardiomyopathy: Secondary | ICD-10-CM | POA: Diagnosis present

## 2019-01-13 DIAGNOSIS — R778 Other specified abnormalities of plasma proteins: Secondary | ICD-10-CM

## 2019-01-13 DIAGNOSIS — E1122 Type 2 diabetes mellitus with diabetic chronic kidney disease: Secondary | ICD-10-CM | POA: Diagnosis present

## 2019-01-13 DIAGNOSIS — E872 Acidosis: Secondary | ICD-10-CM | POA: Diagnosis present

## 2019-01-13 DIAGNOSIS — Z7901 Long term (current) use of anticoagulants: Secondary | ICD-10-CM | POA: Diagnosis not present

## 2019-01-13 DIAGNOSIS — I4819 Other persistent atrial fibrillation: Secondary | ICD-10-CM | POA: Diagnosis present

## 2019-01-13 DIAGNOSIS — N136 Pyonephrosis: Secondary | ICD-10-CM | POA: Diagnosis present

## 2019-01-13 DIAGNOSIS — N201 Calculus of ureter: Secondary | ICD-10-CM | POA: Diagnosis not present

## 2019-01-13 DIAGNOSIS — N1832 Chronic kidney disease, stage 3b: Secondary | ICD-10-CM | POA: Diagnosis present

## 2019-01-13 DIAGNOSIS — Z20822 Contact with and (suspected) exposure to covid-19: Secondary | ICD-10-CM | POA: Insufficient documentation

## 2019-01-13 DIAGNOSIS — Z6841 Body Mass Index (BMI) 40.0 and over, adult: Secondary | ICD-10-CM | POA: Diagnosis not present

## 2019-01-13 DIAGNOSIS — E1165 Type 2 diabetes mellitus with hyperglycemia: Secondary | ICD-10-CM | POA: Diagnosis present

## 2019-01-13 DIAGNOSIS — R739 Hyperglycemia, unspecified: Secondary | ICD-10-CM

## 2019-01-13 DIAGNOSIS — N10 Acute tubulo-interstitial nephritis: Secondary | ICD-10-CM | POA: Diagnosis not present

## 2019-01-13 DIAGNOSIS — R1032 Left lower quadrant pain: Secondary | ICD-10-CM | POA: Diagnosis not present

## 2019-01-13 DIAGNOSIS — N390 Urinary tract infection, site not specified: Secondary | ICD-10-CM | POA: Diagnosis present

## 2019-01-13 DIAGNOSIS — E871 Hypo-osmolality and hyponatremia: Secondary | ICD-10-CM | POA: Diagnosis present

## 2019-01-13 DIAGNOSIS — Z7983 Long term (current) use of bisphosphonates: Secondary | ICD-10-CM | POA: Diagnosis not present

## 2019-01-13 DIAGNOSIS — K219 Gastro-esophageal reflux disease without esophagitis: Secondary | ICD-10-CM | POA: Diagnosis present

## 2019-01-13 DIAGNOSIS — Z833 Family history of diabetes mellitus: Secondary | ICD-10-CM | POA: Diagnosis not present

## 2019-01-13 DIAGNOSIS — I130 Hypertensive heart and chronic kidney disease with heart failure and stage 1 through stage 4 chronic kidney disease, or unspecified chronic kidney disease: Secondary | ICD-10-CM | POA: Diagnosis present

## 2019-01-13 DIAGNOSIS — Z1612 Extended spectrum beta lactamase (ESBL) resistance: Secondary | ICD-10-CM | POA: Diagnosis present

## 2019-01-13 DIAGNOSIS — N132 Hydronephrosis with renal and ureteral calculous obstruction: Secondary | ICD-10-CM | POA: Diagnosis not present

## 2019-01-13 LAB — CBC
HCT: 34.5 % — ABNORMAL LOW (ref 36.0–46.0)
Hemoglobin: 10.9 g/dL — ABNORMAL LOW (ref 12.0–15.0)
MCH: 28.5 pg (ref 26.0–34.0)
MCHC: 31.6 g/dL (ref 30.0–36.0)
MCV: 90.3 fL (ref 80.0–100.0)
Platelets: 230 10*3/uL (ref 150–400)
RBC: 3.82 MIL/uL — ABNORMAL LOW (ref 3.87–5.11)
RDW: 13.7 % (ref 11.5–15.5)
WBC: 19.4 10*3/uL — ABNORMAL HIGH (ref 4.0–10.5)
nRBC: 0 % (ref 0.0–0.2)

## 2019-01-13 LAB — COMPREHENSIVE METABOLIC PANEL
ALT: 33 U/L (ref 0–44)
AST: 16 U/L (ref 15–41)
Albumin: 2.5 g/dL — ABNORMAL LOW (ref 3.5–5.0)
Alkaline Phosphatase: 123 U/L (ref 38–126)
Anion gap: 12 (ref 5–15)
BUN: 30 mg/dL — ABNORMAL HIGH (ref 8–23)
CO2: 19 mmol/L — ABNORMAL LOW (ref 22–32)
Calcium: 8 mg/dL — ABNORMAL LOW (ref 8.9–10.3)
Chloride: 102 mmol/L (ref 98–111)
Creatinine, Ser: 1.49 mg/dL — ABNORMAL HIGH (ref 0.44–1.00)
GFR calc Af Amer: 41 mL/min — ABNORMAL LOW (ref >60)
GFR calc non Af Amer: 35 mL/min — ABNORMAL LOW (ref >60)
Glucose, Bld: 451 mg/dL — ABNORMAL HIGH (ref 70–99)
Potassium: 4.9 mmol/L (ref 3.5–5.1)
Sodium: 133 mmol/L — ABNORMAL LOW (ref 135–145)
Total Bilirubin: 0.6 mg/dL (ref 0.3–1.2)
Total Protein: 6.1 g/dL — ABNORMAL LOW (ref 6.5–8.1)

## 2019-01-13 LAB — HIV ANTIBODY (ROUTINE TESTING W REFLEX): HIV Screen 4th Generation wRfx: NONREACTIVE

## 2019-01-13 LAB — TROPONIN I (HIGH SENSITIVITY)
Troponin I (High Sensitivity): 48 ng/L — ABNORMAL HIGH (ref <18)
Troponin I (High Sensitivity): 45 ng/L — ABNORMAL HIGH (ref <18)
Troponin I (High Sensitivity): 33 ng/L — ABNORMAL HIGH (ref <18)
Troponin I (High Sensitivity): 31 ng/L — ABNORMAL HIGH (ref <18)

## 2019-01-13 LAB — SARS CORONAVIRUS 2 (TAT 6-24 HRS): SARS Coronavirus 2: NEGATIVE

## 2019-01-13 LAB — URINALYSIS, ROUTINE W REFLEX MICROSCOPIC
Bacteria, UA: NONE SEEN
Bilirubin Urine: NEGATIVE
Glucose, UA: >=500 mg/dL — AB
Ketones, ur: 5 mg/dL — AB
Nitrite: NEGATIVE
Protein, ur: NEGATIVE mg/dL
RBC / HPF: >50 RBC/hpf — ABNORMAL HIGH (ref 0–5)
Specific Gravity, Urine: 1.025 (ref 1.005–1.030)
WBC, UA: >50 WBC/hpf — ABNORMAL HIGH (ref 0–5)
pH: 5 (ref 5.0–8.0)

## 2019-01-13 LAB — GLUCOSE, CAPILLARY
Glucose-Capillary: 214 mg/dL — ABNORMAL HIGH (ref 70–99)
Glucose-Capillary: 226 mg/dL — ABNORMAL HIGH (ref 70–99)

## 2019-01-13 LAB — BRAIN NATRIURETIC PEPTIDE: B Natriuretic Peptide: 242.1 pg/mL — ABNORMAL HIGH (ref 0.0–100.0)

## 2019-01-13 LAB — HEMOGLOBIN A1C
Hgb A1c MFr Bld: 8.7 % — ABNORMAL HIGH (ref 4.8–5.6)
Mean Plasma Glucose: 202.99 mg/dL

## 2019-01-13 LAB — CBG MONITORING, ED
Glucose-Capillary: 424 mg/dL — ABNORMAL HIGH (ref 70–99)
Glucose-Capillary: 339 mg/dL — ABNORMAL HIGH (ref 70–99)
Glucose-Capillary: 339 mg/dL — ABNORMAL HIGH (ref 70–99)

## 2019-01-13 LAB — TSH: TSH: 2.426 u[IU]/mL (ref 0.350–4.500)

## 2019-01-13 LAB — MAGNESIUM: Magnesium: 2.4 mg/dL (ref 1.7–2.4)

## 2019-01-13 LAB — POC SARS CORONAVIRUS 2 AG -  ED: SARS Coronavirus 2 Ag: NEGATIVE

## 2019-01-13 MED ORDER — FOLIC ACID 1 MG PO TABS
1.0000 mg | ORAL_TABLET | Freq: Every day | ORAL | Status: DC
  Administered 2019-01-13 – 2019-01-20 (×8): 1 mg via ORAL
  Filled 2019-01-13 (×8): qty 1

## 2019-01-13 MED ORDER — ASPIRIN EC 81 MG PO TBEC
81.0000 mg | DELAYED_RELEASE_TABLET | Freq: Every day | ORAL | Status: DC
  Administered 2019-01-13 – 2019-01-20 (×8): 81 mg via ORAL
  Filled 2019-01-13 (×9): qty 1

## 2019-01-13 MED ORDER — ENOXAPARIN SODIUM 120 MG/0.8ML ~~LOC~~ SOLN
120.0000 mg | Freq: Once | SUBCUTANEOUS | Status: AC
  Administered 2019-01-13: 120 mg via SUBCUTANEOUS
  Filled 2019-01-13: qty 0.8

## 2019-01-13 MED ORDER — INSULIN GLARGINE 100 UNIT/ML ~~LOC~~ SOLN
10.0000 [IU] | Freq: Every day | SUBCUTANEOUS | Status: DC
  Filled 2019-01-13: qty 0.1

## 2019-01-13 MED ORDER — CARVEDILOL 3.125 MG PO TABS
3.1250 mg | ORAL_TABLET | Freq: Two times a day (BID) | ORAL | Status: DC
  Administered 2019-01-13 – 2019-01-20 (×15): 3.125 mg via ORAL
  Filled 2019-01-13 (×16): qty 1

## 2019-01-13 MED ORDER — TAMSULOSIN HCL 0.4 MG PO CAPS
0.4000 mg | ORAL_CAPSULE | Freq: Every day | ORAL | Status: DC
  Administered 2019-01-13 – 2019-01-20 (×8): 0.4 mg via ORAL
  Filled 2019-01-13 (×8): qty 1

## 2019-01-13 MED ORDER — INSULIN ASPART 100 UNIT/ML ~~LOC~~ SOLN
4.0000 [IU] | Freq: Once | SUBCUTANEOUS | Status: AC
  Administered 2019-01-13: 4 [IU] via SUBCUTANEOUS
  Filled 2019-01-13: qty 0.04

## 2019-01-13 MED ORDER — SODIUM CHLORIDE 0.9 % IV SOLN
1.0000 g | Freq: Every day | INTRAVENOUS | Status: DC
  Administered 2019-01-13 – 2019-01-14 (×2): 1 g via INTRAVENOUS
  Filled 2019-01-13: qty 10
  Filled 2019-01-13 (×2): qty 1

## 2019-01-13 MED ORDER — PANTOPRAZOLE SODIUM 40 MG PO TBEC
40.0000 mg | DELAYED_RELEASE_TABLET | Freq: Every day | ORAL | Status: DC
  Administered 2019-01-13 – 2019-01-20 (×8): 40 mg via ORAL
  Filled 2019-01-13 (×8): qty 1

## 2019-01-13 MED ORDER — ALBUTEROL SULFATE HFA 108 (90 BASE) MCG/ACT IN AERS
8.0000 | INHALATION_SPRAY | Freq: Once | RESPIRATORY_TRACT | Status: AC
  Administered 2019-01-13: 8 via RESPIRATORY_TRACT
  Filled 2019-01-13: qty 6.7

## 2019-01-13 MED ORDER — ACETAMINOPHEN 325 MG PO TABS
650.0000 mg | ORAL_TABLET | Freq: Four times a day (QID) | ORAL | Status: DC | PRN
  Administered 2019-01-13 – 2019-01-14 (×2): 650 mg via ORAL
  Filled 2019-01-13 (×2): qty 2

## 2019-01-13 MED ORDER — PSYLLIUM 28 % PO PACK: 1.0000 | PACK | Freq: Two times a day (BID) | ORAL | Status: DC

## 2019-01-13 MED ORDER — SIMVASTATIN 20 MG PO TABS
10.0000 mg | ORAL_TABLET | Freq: Every day | ORAL | Status: DC
  Administered 2019-01-13 – 2019-01-19 (×7): 10 mg via ORAL
  Filled 2019-01-13 (×8): qty 1

## 2019-01-13 MED ORDER — HEPARIN SODIUM (PORCINE) 5000 UNIT/ML IJ SOLN
5000.0000 [IU] | Freq: Three times a day (TID) | INTRAMUSCULAR | Status: DC
  Filled 2019-01-13 (×2): qty 1

## 2019-01-13 MED ORDER — INSULIN ASPART 100 UNIT/ML ~~LOC~~ SOLN
6.0000 [IU] | Freq: Three times a day (TID) | SUBCUTANEOUS | Status: DC
  Administered 2019-01-13 – 2019-01-16 (×7): 6 [IU] via SUBCUTANEOUS
  Filled 2019-01-13: qty 0.06

## 2019-01-13 MED ORDER — SOTALOL HCL 80 MG PO TABS
80.0000 mg | ORAL_TABLET | Freq: Two times a day (BID) | ORAL | Status: DC
  Administered 2019-01-13 – 2019-01-20 (×15): 80 mg via ORAL
  Filled 2019-01-13 (×17): qty 1

## 2019-01-13 MED ORDER — RIVAROXABAN 10 MG PO TABS
20.0000 mg | ORAL_TABLET | Freq: Every evening | ORAL | Status: DC
  Administered 2019-01-13 – 2019-01-19 (×7): 20 mg via ORAL
  Filled 2019-01-13 (×5): qty 1
  Filled 2019-01-13: qty 2
  Filled 2019-01-13 (×2): qty 1

## 2019-01-13 MED ORDER — HYDROCODONE-ACETAMINOPHEN 5-325 MG PO TABS
1.0000 | ORAL_TABLET | Freq: Three times a day (TID) | ORAL | Status: DC | PRN
  Administered 2019-01-13 – 2019-01-14 (×3): 1 via ORAL
  Filled 2019-01-13 (×3): qty 1

## 2019-01-13 MED ORDER — ACETAMINOPHEN 500 MG PO TABS
1000.0000 mg | ORAL_TABLET | Freq: Once | ORAL | Status: AC
  Administered 2019-01-13: 1000 mg via ORAL
  Filled 2019-01-13: qty 2

## 2019-01-13 MED ORDER — INSULIN ASPART 100 UNIT/ML ~~LOC~~ SOLN
0.0000 [IU] | Freq: Three times a day (TID) | SUBCUTANEOUS | Status: DC
  Administered 2019-01-13: 5 [IU] via SUBCUTANEOUS
  Administered 2019-01-13 (×2): 11 [IU] via SUBCUTANEOUS
  Administered 2019-01-14: 5 [IU] via SUBCUTANEOUS
  Administered 2019-01-14 (×2): 3 [IU] via SUBCUTANEOUS
  Administered 2019-01-15: 8 [IU] via SUBCUTANEOUS
  Administered 2019-01-15 (×2): 5 [IU] via SUBCUTANEOUS
  Administered 2019-01-16: 3 [IU] via SUBCUTANEOUS
  Administered 2019-01-16: 5 [IU] via SUBCUTANEOUS
  Administered 2019-01-16 – 2019-01-17 (×3): 3 [IU] via SUBCUTANEOUS
  Administered 2019-01-17: 2 [IU] via SUBCUTANEOUS
  Filled 2019-01-13: qty 0.15

## 2019-01-13 MED ORDER — PSYLLIUM 95 % PO PACK
1.0000 | PACK | Freq: Two times a day (BID) | ORAL | Status: DC
  Administered 2019-01-13 – 2019-01-20 (×14): 1 via ORAL
  Filled 2019-01-13 (×17): qty 1

## 2019-01-13 MED ORDER — SODIUM CHLORIDE 0.9 % IV SOLN
INTRAVENOUS | Status: AC
  Administered 2019-01-13 (×2): via INTRAVENOUS

## 2019-01-13 MED ORDER — INSULIN GLARGINE 100 UNIT/ML ~~LOC~~ SOLN
24.0000 [IU] | Freq: Every day | SUBCUTANEOUS | Status: DC
  Administered 2019-01-13 – 2019-01-15 (×3): 24 [IU] via SUBCUTANEOUS
  Filled 2019-01-13 (×4): qty 0.24

## 2019-01-13 MED ORDER — THIAMINE HCL 100 MG/ML IJ SOLN
Freq: Once | INTRAVENOUS | Status: AC
  Administered 2019-01-13: 06:00:00 via INTRAVENOUS
  Filled 2019-01-13: qty 1000

## 2019-01-13 MED ORDER — SODIUM CHLORIDE 0.9 % IV SOLN
1.0000 g | Freq: Once | INTRAVENOUS | Status: AC
  Administered 2019-01-13: 1 g via INTRAVENOUS
  Filled 2019-01-13: qty 10

## 2019-01-13 NOTE — Plan of Care (Signed)

## 2019-01-13 NOTE — Progress Notes (Signed)
Inpatient Diabetes Program Recommendations  AACE/ADA: New Consensus Statement on Inpatient Glycemic Control (2015)  Target Ranges:  Prepandial:   less than 140 mg/dL      Peak postprandial:   less than 180 mg/dL (1-2 hours)      Critically ill patients:  140 - 180 mg/dL   Lab Results  Component Value Date   GLUCAP 339 (H) 01/13/2019   HGBA1C 8.7 (H) 01/13/2019   Results for Feenstra, Karen Rasmussen (MRN [id number]) as of 01/13/2019 11:05  Ref. Range 10/20/2018 14:25 01/13/2019 05:42  Hemoglobin A1C Latest Ref Range: 4.8 - 5.6 % 10.0 (H) 8.7 (H)   Review of Glycemic Control  Diabetes history: DM 2 Outpatient Diabetes medications:  Amaryl 8 mg daily, Metformin 500 mg bid Current orders for Inpatient glycemic control:  Novolog moderate tid with meals Novolog 6 units tid with meals Lantus 10 units q HS  Inpatient Diabetes Program Recommendations:    Please consider increasing Lantus to 24 units daily (0.2 units/kg) ans start now.  Interestingly, A1C is improved from September, 2020.  Likely blood sugars increased with infection.  May need addition of basal insulin at d/c or possibly start of GLP-1 such as weekly Trulicity.  Needs f/u with PCP as well.   Thanks  Adah Perl, RN, BC-ADM Inpatient Diabetes Coordinator Pager [phone] (8a-5p)

## 2019-01-13 NOTE — ED Provider Notes (Signed)
Case d/w [facility] of cardiology continue to cycle troponins.  Anticoagulation, which she is already on and cardiology will consult in the am.     Britta Louth, MD 01/13/19 [id number]

## 2019-01-13 NOTE — Progress Notes (Signed)
Pt prefers to go on nocturnal cpap around midnight.  This Probation officer will return at that time per pt request.  RN aware.

## 2019-01-13 NOTE — H&P (Signed)
History and Physical    Karen Rasmussen [id number] DOB: 04/02/1945 DOA: 01/12/2019  PCP: None noted I have personally briefly reviewed patient's records in the emergency room  Chief Complaint: Weakness with hyperglycemia  HPI: Karen Rasmussen is a 73 y.o. female with medical history significant for diabetes mellitus, orbit obesity sleep apnea hyperlipidemia chronic diastolic heart failure with last ejection fraction being 45 to 50% who presented with generalized weakness.  It is moderate has gradually worsened is worse than any time before.  It is not relieved or exacerbated by any elements. ED Course: She has been gently hydrated.  And seems to be getting a little stronger according to her own statement.  Review of Systems: Pertinent positives #1 generalized weakness #2 blood sugars running high #3 mild worsening dyspnea on exertion #4 fatigue and possibly low-grade fever.  #5 edema of legs Pertinent negatives: No chest pain, no dyspnea at rest, no significant abdominal pain, no nausea or vomiting, no significant headaches dizziness or falls, (she has not been very mobile at home). She uses CPAP for obstructive sleep apnea  Past Medical History:  Diagnosis Date  . Abdominal pain, left lower quadrant   . Asthmatic bronchitis   . Benign essential HTN 04/26/2016  . Benign hypertensive heart disease without heart failure   . Bursitis of hip   . Chronic diastolic CHF (congestive heart failure) ([place])   . DCM (dilated cardiomyopathy) ([place])    EF 45-50%  . GERD (gastroesophageal reflux disease)   . Heart murmur   . High cholesterol   . History of kidney stones   . Hypersomnia   . Morbid obesity with BMI of 50.0-59.9, adult ([place])   . OSA on CPAP   . Osteoarthritis    "right hip; both knees" (09/21/2016)  . Persistent atrial fibrillation ([place]) 04/26/2016  . Situational depression 2003   "when I was going thru my divorce"  . Type II diabetes mellitus ([place])   . Vitamin D deficiency disease      Past Surgical History:  Procedure Laterality Date  . ABDOMINAL HYSTERECTOMY    . APPENDECTOMY    . BALLOON DILATION N/A 11/17/2017   Procedure: BALLOON DILATION;  Surgeon: Ronnette Juniper, MD;  Location: [facility];  Service: Gastroenterology;  Laterality: N/A;  . BIOPSY  11/17/2017   Procedure: BIOPSY;  Surgeon: Ronnette Juniper, MD;  Location: [facility] ENDOSCOPY;  Service: Gastroenterology;;  . CARDIAC CATHETERIZATION    . CARDIOVERSION N/A 06/11/2016   Procedure: CARDIOVERSION;  Surgeon: Dorothy Spark, MD;  Location: [facility] ENDOSCOPY;  Service: Cardiovascular;  Laterality: N/A;  . CARDIOVERSION N/A 07/16/2016   Procedure: CARDIOVERSION;  Surgeon: Thayer Headings, MD;  Location: [facility] ENDOSCOPY;  Service: Cardiovascular;  Laterality: N/A;  . CARDIOVERSION N/A 09/23/2016   Procedure: CARDIOVERSION;  Surgeon: Sanda Klein, MD;  Location: [facility] ENDOSCOPY;  Service: Cardiovascular;  Laterality: N/A;  . CHOLECYSTECTOMY OPEN    . COLONOSCOPY N/A 11/17/2017   Procedure: COLONOSCOPY;  Surgeon: Ronnette Juniper, MD;  Location: [facility] ENDOSCOPY;  Service: Gastroenterology;  Laterality: N/A;  . costisone injection to left knee  11/03/2017  . DILATION AND CURETTAGE OF UTERUS     S/P miscarriage  . ESOPHAGOGASTRODUODENOSCOPY N/A 11/17/2017   Procedure: ESOPHAGOGASTRODUODENOSCOPY (EGD);  Surgeon: Ronnette Juniper, MD;  Location: [facility];  Service: Gastroenterology;  Laterality: N/A;  . NASAL SEPTUM SURGERY    . POLYPECTOMY  11/17/2017   Procedure: POLYPECTOMY;  Surgeon: Ronnette Juniper, MD;  Location: [facility] ENDOSCOPY;  Service: Gastroenterology;;  . Darrick Huntsman  reports that she has never smoked. She has never used smokeless tobacco. She reports current alcohol use. She reports that she does not use drugs.  Allergies  Allergen Reactions  . No [employer] Food Allergies     Defibrillator pads cause rash.    Family History  Problem Relation Age of Onset  . Cancer Father   . Heart failure Father   . Diabetes  Father   . CAD Mother   . Diabetes Brother   . Hypertension Brother   . Breast cancer Neg Hx     Prior to Admission medications   Medication Sig Start Date End Date Taking? Authorizing Provider  acetaminophen (TYLENOL) 650 MG CR tablet Take 650 mg by mouth 2 (two) times daily as needed for pain.   Yes [provider]  alendronate (FOSAMAX) 70 MG tablet Take 70 mg by mouth every Sunday. Take with a full glass of water on an empty stomach.    Yes [provider]  Biotin w/ Vitamins C & E (HAIR SKIN & NAILS GUMMIES PO) Take 2 tablets by mouth daily.    Yes [provider]  CALCIUM PO Take 650 mg by mouth daily.   Yes [provider]  carvedilol (COREG) 3.125 MG tablet Take 1 tablet (3.125 mg total) by mouth 2 (two) times daily with a meal. 04/04/18 03/30/19 Yes Turner, Eber Hong, MD  Cholecalciferol (VITAMIN D3) 5000 units CAPS Take 5,000 Units by mouth daily.   Yes [provider]  diphenhydrAMINE-zinc acetate (BENADRYL) cream Apply 1 application topically 3 (three) times daily as needed for itching.   Yes [provider]  glimepiride (AMARYL) 4 MG tablet Take 8 mg by mouth daily with breakfast.    Yes [provider]  HYDROcodone-acetaminophen (NORCO/VICODIN) 5-325 MG tablet Take 1 tablet by mouth every 8 (eight) hours as needed for moderate pain.    Yes [provider]  metFORMIN (GLUCOPHAGE) 500 MG tablet Take 500 mg by mouth 2 (two) times daily with a meal.    Yes [provider]  Misc Natural Products (GLUCOSAMINE CHONDROITIN TRIPLE) TABS Take 2 tablets by mouth daily.    Yes [provider]  Multiple Vitamins-Minerals (CENTRUM SILVER) CHEW Chew 2 tablets by mouth daily. soft chews   Yes [provider]  pantoprazole (PROTONIX) 40 MG tablet Take 40 mg by mouth daily.   Yes [provider]  potassium chloride (K-DUR,KLOR-CON) 10 MEQ tablet TAKE 2 TABLETS BY MOUTH TWICE DAILY Patient taking  differently: Take 20 mEq by mouth 2 (two) times daily.  04/04/18  Yes Turner, Traci R, MD  psyllium (METAMUCIL SMOOTH TEXTURE) 28 % packet Take 1 packet by mouth 2 (two) times daily.    Yes [provider]  rivaroxaban (XARELTO) 20 MG TABS tablet Take 1 tablet (20 mg total) by mouth every evening. 04/04/18 04/04/19 Yes Turner, Eber Hong, MD  simvastatin (ZOCOR) 10 MG tablet Take 1 tablet (10 mg total) by mouth at bedtime. 04/04/18 04/04/19 Yes Turner, Eber Hong, MD  sotalol (BETAPACE) 80 MG tablet TAKE 1 TABLET(80 MG) BY MOUTH EVERY 12 HOURS Patient taking differently: Take 80 mg by mouth 2 (two) times daily.  04/04/18  Yes Turner, Eber Hong, MD  spironolactone (ALDACTONE) 25 MG tablet TAKE 1/2 TABLET (12.5MG     TOTAL) DAILY Patient taking differently: Take 12.5 mg by mouth daily.  10/30/18  Yes Turner, Eber Hong, MD  tamsulosin (FLOMAX) 0.4 MG CAPS capsule Take 0.4 mg by mouth daily.  Yes [provider]  triamcinolone cream (KENALOG) 0.1 % Apply 1 application topically 2 (two) times daily as needed (itching legs).  12/15/18  Yes [provider]    Physical Exam: Vitals:   01/13/19 0224 01/13/19 0229 01/13/19 0300 01/13/19 0330  BP: (!) 132/59  (!) 137/57 127/60  Pulse: 76  76 76  Resp: (!) 23  (!) 25 20  Temp:  98.3 F (36.8 C)    TempSrc:  Oral    SpO2: 96%  91% 90%  Weight:      Height:        Constitutional: NAD, calm, comfortable Vitals:   01/13/19 0224 01/13/19 0229 01/13/19 0300 01/13/19 0330  BP: (!) 132/59  (!) 137/57 127/60  Pulse: 76  76 76  Resp: (!) 23  (!) 25 20  Temp:  98.3 F (36.8 C)    TempSrc:  Oral    SpO2: 96%  91% 90%  Weight:      Height:      Eyes are normally reactive Ear nose throat: No erythema or exudate Skin: Turgor is fair. Neck: Supple with no JVD. Chest symmetric movements. Lungs: Diminished breath sounds in the lower lung fields but no adventitious sounds. Heart: Regular rhythm with a positive S4 no ventricular gallop noted no  murmurs. Extremities: Trace edema.  No calf tenderness.  No focal swelling or erythema. Neurologic examination: Is grossly unremarkable.  She is alert oriented and insightful Psychiatric: Mood and affect are normal.  She does not appear to be depressed (Labs on Admission: I have personally reviewed following labs and imaging studies White count elevated at 14.6.  Hemoglobin 11.8.  Sodium 131 CO2 is low at 19 blood sugar 465 BUN 32 creatinine 1.81 calcium low at 8.2 GFR is 36 UA reveals leukocyte esterase glucosuria and some ketonuria. CBC: Recent Labs  Lab 01/12/19 2259  WBC 14.6*  HGB 11.8*  HCT 37.3  MCV 90.3  PLT [id number]   Basic Metabolic Panel: Recent Labs  Lab 01/12/19 2259  NA 131*  K 4.6  CL 101  CO2 19*  GLUCOSE 465*  BUN 32*  CREATININE 1.81*  CALCIUM 8.2*   GFR: Estimated Creatinine Clearance: 36.3 mL/min (A) (by C-G formula based on SCr of 1.81 mg/dL (H)). Liver Function Tests: No results for input(s): AST, ALT, ALKPHOS, BILITOT, PROT, ALBUMIN in the last 168 hours. No results for input(s): LIPASE, AMYLASE in the last 168 hours. No results for input(s): AMMONIA in the last 168 hours. Coagulation Profile: No results for input(s): INR, PROTIME in the last 168 hours. Cardiac Enzymes: No results for input(s): CKTOTAL, CKMB, CKMBINDEX, TROPONINI in the last 168 hours. BNP (last 3 results) No results for input(s): PROBNP in the last 8760 hours. HbA1C: No results for input(s): HGBA1C in the last 72 hours. CBG: Recent Labs  Lab 01/12/19 2307 01/13/19 0354  GLUCAP 412* 424*   Lipid Profile: No results for input(s): CHOL, HDL, LDLCALC, TRIG, CHOLHDL, LDLDIRECT in the last 72 hours. Thyroid Function Tests: No results for input(s): TSH, T4TOTAL, FREET4, T3FREE, THYROIDAB in the last 72 hours. Anemia Panel: No results for input(s): VITAMINB12, FOLATE, FERRITIN, TIBC, IRON, RETICCTPCT in the last 72 hours. Urine analysis:    Component Value Date/Time   COLORURINE  YELLOW 01/13/2019 0024   APPEARANCEUR HAZY (A) 01/13/2019 0024   LABSPEC 1.025 01/13/2019 0024   PHURINE 5.0 01/13/2019 0024   GLUCOSEU >=500 (A) 01/13/2019 0024   HGBUR LARGE (A) 01/13/2019 0024   BILIRUBINUR NEGATIVE 01/13/2019  0024   KETONESUR 5 (A) 01/13/2019 0024   PROTEINUR NEGATIVE 01/13/2019 0024   UROBILINOGEN 0.2 11/08/2010 0050   NITRITE NEGATIVE 01/13/2019 0024   LEUKOCYTESUR LARGE (A) 01/13/2019 0024    Radiological Exams on Admission: Nm Pulmonary Perfusion  Result Date: 01/13/2019 CLINICAL DATA:  Shortness of breath. EXAM: NUCLEAR MEDICINE PERFUSION LUNG SCAN TECHNIQUE: Perfusion images were obtained in multiple projections after intravenous injection of radiopharmaceutical. Ventilation scans intentionally deferred if perfusion scan and chest x-ray adequate for interpretation during COVID 19 epidemic. RADIOPHARMACEUTICALS:  1.5 mCi Tc-24m MAA IV COMPARISON:  Chest x-ray January 12, 2019 FINDINGS: No segmental defects to suggest pulmonary emboli. IMPRESSION: No evidence of pulmonary embolus on this study. Electronically Signed   By: Dorise Bullion III M.D   On: 01/13/2019 02:32   Dg Chest Portable 1 View  Result Date: 01/12/2019 CLINICAL DATA:  Weakness.  Shortness of breath. EXAM: PORTABLE CHEST 1 VIEW COMPARISON:  November 08, 2010 FINDINGS: Cardiomegaly. The hila and mediastinum are normal. No pneumothorax. Haziness over the bases may simply be due to overlapping soft tissues and patient body habitus. No definitive infiltrate identified. No over pulmonary edema noted. IMPRESSION: The study is limited due to patient body habitus. Within this limitation, no focal infiltrates or overt edema are identified. Electronically Signed   By: Dorise Bullion III M.D   On: 01/12/2019 23:46    EKG: Rate 80 sinus rhythm with PACs left axis criteria of LVH  Assessment/Plan #1 urinary tract infection #2 elevated troponin. #3 dehydration. #4 generalized weakness. #5 hyperglycemia. #6  obstructive sleep apnea. #7 CKD 3B Plan: Hydration will be gentle given her history of diastolic heart failure, monitor kidney function, Continue Rocephin coverage and continue CPAP. She is not frankly acidotic.  Will cover with sliding scale. Cardiology was consulted by the ED physician they would like to cycle the cardiac enzymes and see the patient in the morning.    DVT prophylaxis: Heparin Code Status: Full Family Communication: No family present Disposition Plan: Home with home care Consults called: Cardiology was consulted Admission status: Full   Pearlean Brownie MD [facility] Pager [phone]  If 7PM-7AM, please contact night-coverage [email or web address] Password [facility]  01/13/2019, 4:37 AM

## 2019-01-13 NOTE — ED Notes (Signed)
ED TO INPATIENT HANDOFF REPORT  Name/Age/Gender Karen Rasmussen 73 y.o. female  Code Status    Code Status Orders  (From admission, onward)         Start     Ordered   01/13/19 0503  Full code  Continuous     01/13/19 0518        Code Status History    Date Active Date Inactive Code Status Order ID Comments User Context   09/21/2016 1628 09/24/2016 1749 Full Code [id number]  Baldwin Jamaica, PA-C Inpatient   Advance Care Planning Activity    Advance Directive Documentation     Most Recent Value  Type of Advance Directive  Living will  Pre-existing out of facility DNR order (yellow form or pink MOST form)  -  "MOST" Form in Place?  -      Home/SNF/Other Home  Chief Complaint Lauretta Chester and Hyperglycemia  Level of Care/Admitting Diagnosis ED Disposition    ED Disposition Condition [place]: [place] [[id number]]  Level of Care: Telemetry [5]  Admit to tele based on following criteria: Monitor for Ischemic changes  Covid Evaluation: Asymptomatic Screening Protocol (No Symptoms)  Diagnosis: Hyperosmolar hyperglycemic state (HHS) [facility]) [[id number]]  Admitting Physician: Pearlean Brownie [[id number]]  Attending Physician: HAQ, AHMAD T [id number]  Estimated length of stay: 3 - 4 days  Certification:: I certify this patient will need inpatient services for at least 2 midnights  PT Class (Do Not Modify): Inpatient [101]  PT Acc Code (Do Not Modify): Private [1]       Medical History Past Medical History:  Diagnosis Date  . Abdominal pain, left lower quadrant   . Asthmatic bronchitis   . Benign essential HTN 04/26/2016  . Benign hypertensive heart disease without heart failure   . Bursitis of hip   . Chronic diastolic CHF (congestive heart failure) ([place])   . DCM (dilated cardiomyopathy) ([place])    EF 45-50%  . GERD (gastroesophageal reflux disease)   . Heart murmur   . High cholesterol   . History of kidney stones   . Hypersomnia   .  Morbid obesity with BMI of 50.0-59.9, adult ([place])   . OSA on CPAP   . Osteoarthritis    "right hip; both knees" (09/21/2016)  . Persistent atrial fibrillation ([place]) 04/26/2016  . Situational depression 2003   "when I was going thru my divorce"  . Type II diabetes mellitus ([place])   . Vitamin D deficiency disease     Allergies Allergies  Allergen Reactions  . No [employer] Allergies     Defibrillator pads cause rash.    IV Location/Drains/Wounds Patient Lines/Drains/Airways Status   Active Line/Drains/Airways    Name:   Placement date:   Placement time:   Site:   Days:   Peripheral IV 01/12/19 Anterior Antecubital   01/12/19    2258    Antecubital   1          Labs/Imaging Results for orders placed or performed during the hospital encounter of 01/12/19 (from the past 48 hour(s))  Basic metabolic panel     Status: Abnormal   Collection Time: 01/12/19 10:59 PM  Result Value Ref Range   Sodium 131 (L) 135 - 145 mmol/L   Potassium 4.6 3.5 - 5.1 mmol/L   Chloride 101 98 - 111 mmol/L   CO2 19 (L) 22 - 32 mmol/L   Glucose, Bld 465 (H) 70 - 99 mg/dL  BUN 32 (H) 8 - 23 mg/dL   Creatinine, Ser 1.81 (H) 0.44 - 1.00 mg/dL   Calcium 8.2 (L) 8.9 - 10.3 mg/dL   GFR calc non Af Amer 28 (L) >60 mL/min   GFR calc Af Amer 32 (L) >60 mL/min   Anion gap 11 5 - 15    Comment: Performed at [facility], [place] [address] [place], [place] [address]  CBC     Status: Abnormal   Collection Time: 01/12/19 10:59 PM  Result Value Ref Range   WBC 14.6 (H) 4.0 - 10.5 K/uL   RBC 4.13 3.87 - 5.11 MIL/uL   Hemoglobin 11.8 (L) 12.0 - 15.0 g/dL   HCT 37.3 36.0 - 46.0 %   MCV 90.3 80.0 - 100.0 fL   MCH 28.6 26.0 - 34.0 pg   MCHC 31.6 30.0 - 36.0 g/dL   RDW 13.5 11.5 - 15.5 %   Platelets 228 150 - 400 K/uL   nRBC 0.0 0.0 - 0.2 %    Comment: Performed at [facility], [place] [address] [place], [place] [address]  Troponin I (High Sensitivity)     Status:  Abnormal   Collection Time: 01/12/19 10:59 PM  Result Value Ref Range   Troponin I (High Sensitivity) 48 (H) <18 ng/L    Comment: (NOTE) Elevated high sensitivity troponin I (hsTnI) values and significant  changes across serial measurements may suggest ACS but many other  chronic and acute conditions are known to elevate hsTnI results.  Refer to the "Links" section for chest pain algorithms and additional  guidance. Performed at [facility], [place] [address] [place], [place] [address]   CBG monitoring, ED     Status: Abnormal   Collection Time: 01/12/19 11:07 PM  Result Value Ref Range   Glucose-Capillary 412 (H) 70 - 99 mg/dL  POC SARS Coronavirus 2 Ag-ED - Nasal Swab (BD Veritor Kit)     Status: None   Collection Time: 01/13/19 12:11 AM  Result Value Ref Range   SARS Coronavirus 2 Ag NEGATIVE NEGATIVE    Comment: (NOTE) SARS-CoV-2 antigen NOT DETECTED.  Negative results are presumptive.  Negative results do not preclude SARS-CoV-2 infection and should not be used as the sole basis for treatment or other patient management decisions, including infection  control decisions, particularly in the presence of clinical signs and  symptoms consistent with COVID-19, or in those who have been in contact with the virus.  Negative results must be combined with clinical observations, patient history, and epidemiological information. The expected result is Negative. Fact Sheet for Patients: PodPark.tn Fact Sheet for Healthcare Providers: GiftContent.is This test is not yet approved or cleared by the [place] FDA and  has been authorized for detection and/or diagnosis of SARS-CoV-2 by FDA under an Emergency Use Authorization (EUA).  This EUA will remain in effect (meaning this test can be used) for the duration of  the COVID-19 de claration under Section 564(b)(1) of the Act, 21 U.S.C. section 360bbb-3(b)(1),  unless the authorization is terminated or revoked sooner.   Urinalysis, Routine w reflex microscopic     Status: Abnormal   Collection Time: 01/13/19 12:24 AM  Result Value Ref Range   Color, Urine YELLOW YELLOW   APPearance HAZY (A) CLEAR   Specific Gravity, Urine 1.025 1.005 - 1.030   pH 5.0 5.0 - 8.0   Glucose, UA >=500 (A) NEGATIVE mg/dL   Hgb urine dipstick LARGE (A) NEGATIVE   Bilirubin  Urine NEGATIVE NEGATIVE   Ketones, ur 5 (A) NEGATIVE mg/dL   Protein, ur NEGATIVE NEGATIVE mg/dL   Nitrite NEGATIVE NEGATIVE   Leukocytes,Ua LARGE (A) NEGATIVE   RBC / HPF >50 (H) 0 - 5 RBC/hpf   WBC, UA >50 (H) 0 - 5 WBC/hpf   Bacteria, UA NONE SEEN NONE SEEN   Squamous Epithelial / LPF 0-5 0 - 5   Mucus PRESENT    Budding Yeast PRESENT     Comment: Performed at [facility], [place] [address] [place], [place] [address]  SARS CORONAVIRUS 2 (TAT 6-24 HRS) Nasopharyngeal Nasopharyngeal Swab     Status: None   Collection Time: 01/13/19 12:24 AM   Specimen: Nasopharyngeal Swab  Result Value Ref Range   SARS Coronavirus 2 NEGATIVE NEGATIVE    Comment: (NOTE) SARS-CoV-2 target nucleic acids are NOT DETECTED. The SARS-CoV-2 RNA is generally detectable in upper and lower respiratory specimens during the acute phase of infection. Negative results do not preclude SARS-CoV-2 infection, do not rule out co-infections with other pathogens, and should not be used as the sole basis for treatment or other patient management decisions. Negative results must be combined with clinical observations, patient history, and epidemiological information. The expected result is Negative. Fact Sheet for Patients: SugarRoll.be Fact Sheet for Healthcare Providers: https://www.woods-mathews.com/ This test is not yet approved or cleared by the [place] FDA and  has been authorized for detection and/or diagnosis of SARS-CoV-2 by FDA under an Emergency Use  Authorization (EUA). This EUA will remain  in effect (meaning this test can be used) for the duration of the COVID-19 declaration under Section 56 4(b)(1) of the Act, 21 U.S.C. section 360bbb-3(b)(1), unless the authorization is terminated or revoked sooner. Performed at [facility], [place] [address], [place], [place] [address]   Troponin I (High Sensitivity)     Status: Abnormal   Collection Time: 01/13/19  2:23 AM  Result Value Ref Range   Troponin I (High Sensitivity) 45 (H) <18 ng/L    Comment: (NOTE) Elevated high sensitivity troponin I (hsTnI) values and significant  changes across serial measurements may suggest ACS but many other  chronic and acute conditions are known to elevate hsTnI results.  Refer to the "Links" section for chest pain algorithms and additional  guidance. Performed at [facility], [place] [address], [place], [place] [address]   CBG monitoring, ED     Status: Abnormal   Collection Time: 01/13/19  3:54 AM  Result Value Ref Range   Glucose-Capillary 424 (H) 70 - 99 mg/dL  Brain natriuretic peptide     Status: Abnormal   Collection Time: 01/13/19  5:42 AM  Result Value Ref Range   B Natriuretic Peptide 242.1 (H) 0.0 - 100.0 pg/mL    Comment: Performed at [facility], [place] [address] [place],  [address]  HIV Antibody (routine testing w rflx)     Status: None   Collection Time: 01/13/19  5:42 AM  Result Value Ref Range   HIV Screen 4th Generation wRfx NON REACTIVE NON REACTIVE    Comment: Performed at [place] [address] [place] [address]  CBC     Status: Abnormal   Collection Time: 01/13/19  5:42 AM  Result Value Ref Range   WBC 19.4 (H) 4.0 - 10.5 K/uL   RBC 3.82 (L) 3.87 - 5.11 MIL/uL   Hemoglobin 10.9 (L) 12.0 - 15.0 g/dL   HCT 34.5 (L)  36.0 - 46.0 %   MCV 90.3 80.0 - 100.0 fL   MCH 28.5 26.0 - 34.0 pg   MCHC 31.6 30.0 - 36.0 g/dL   RDW 13.7 11.5 - 15.5 %   Platelets 230  150 - 400 K/uL   nRBC 0.0 0.0 - 0.2 %    Comment: Performed at [facility], [place] [address], [place], [place] [address]  TSH     Status: None   Collection Time: 01/13/19  5:42 AM  Result Value Ref Range   TSH 2.426 0.350 - 4.500 uIU/mL    Comment: Performed by a 3rd Generation assay with a functional sensitivity of <=0.01 uIU/mL. Performed at [facility], [place] [address], [place], [place] [address]   Comprehensive metabolic panel     Status: Abnormal   Collection Time: 01/13/19  5:42 AM  Result Value Ref Range   Sodium 133 (L) 135 - 145 mmol/L   Potassium 4.9 3.5 - 5.1 mmol/L   Chloride 102 98 - 111 mmol/L   CO2 19 (L) 22 - 32 mmol/L   Glucose, Bld 451 (H) 70 - 99 mg/dL   BUN 30 (H) 8 - 23 mg/dL   Creatinine, Ser 1.49 (H) 0.44 - 1.00 mg/dL   Calcium 8.0 (L) 8.9 - 10.3 mg/dL   Total Protein 6.1 (L) 6.5 - 8.1 g/dL   Albumin 2.5 (L) 3.5 - 5.0 g/dL   AST 16 15 - 41 U/L   ALT 33 0 - 44 U/L   Alkaline Phosphatase 123 38 - 126 U/L   Total Bilirubin 0.6 0.3 - 1.2 mg/dL   GFR calc non Af Amer 35 (L) >60 mL/min   GFR calc Af Amer 41 (L) >60 mL/min   Anion gap 12 5 - 15    Comment: Performed at [facility], [place] [address] [place], [place] [address]  Hemoglobin A1c     Status: Abnormal   Collection Time: 01/13/19  5:42 AM  Result Value Ref Range   Hgb A1c MFr Bld 8.7 (H) 4.8 - 5.6 %    Comment: (NOTE) Pre diabetes:          5.7%-6.4% Diabetes:              >6.4% Glycemic control for   <7.0% adults with diabetes    Mean Plasma Glucose 202.99 mg/dL    Comment: Performed at [place] [address], [place], [place] [address]  Troponin I (High Sensitivity)     Status: Abnormal   Collection Time: 01/13/19  5:42 AM  Result Value Ref Range   Troponin I (High Sensitivity) 33 (H) <18 ng/L    Comment: (NOTE) Elevated high sensitivity troponin I (hsTnI) values and significant  changes across serial measurements may  suggest ACS but many other  chronic and acute conditions are known to elevate hsTnI results.  Refer to the "Links" section for chest pain algorithms and additional  guidance. Performed at [facility], [place] [address] [place], [place] [address]   CBG monitoring, ED     Status: Abnormal   Collection Time: 01/13/19  9:39 AM  Result Value Ref Range   Glucose-Capillary 339 (H) 70 - 99 mg/dL  Troponin I (High Sensitivity)     Status: Abnormal   Collection Time: 01/13/19  9:47 AM  Result Value Ref Range   Troponin I (High Sensitivity) 31 (H) <18 ng/L    Comment: (NOTE) Elevated high sensitivity troponin I (hsTnI)  values and significant  changes across serial measurements may suggest ACS but many other  chronic and acute conditions are known to elevate hsTnI results.  Refer to the "Links" section for chest pain algorithms and additional  guidance. Performed at [facility], [place] [address] [place], [place] [address]   Magnesium     Status: None   Collection Time: 01/13/19  9:48 AM  Result Value Ref Range   Magnesium 2.4 1.7 - 2.4 mg/dL    Comment: Performed at [facility], [place] [address] [place], [place] [address]  CBG monitoring, ED     Status: Abnormal   Collection Time: 01/13/19 12:25 PM  Result Value Ref Range   Glucose-Capillary 339 (H) 70 - 99 mg/dL   Ct Abdomen Pelvis Wo Contrast  Result Date: 01/13/2019 CLINICAL DATA:  Weakness of fevers. EXAM: CT ABDOMEN AND PELVIS WITHOUT CONTRAST TECHNIQUE: Multidetector CT imaging of the abdomen and pelvis was performed following the standard protocol without IV contrast. COMPARISON:  CT 03/12/2011 FINDINGS: Lower chest: Lung bases are clear. Hepatobiliary: No focal hepatic lesion. Postcholecystectomy. No biliary dilatation. Pancreas: Pancreas is normal. No ductal dilatation. No pancreatic inflammation. Spleen: Normal spleen Adrenals/urinary tract: Adrenal glands normal. Mild  hydronephrosis of the LEFT kidney secondary to obstructing calculus at the LEFT ureteropelvic junction. The calculus measures 7 mm (image 34/2). There is mild renal edema on the LEFT. Two additional nonobstructing calculi in lower pole of the LEFT kidney. No distal LEFT ureterolithiasis. No bladder calculi. The RIGHT kidney is stone free. No RIGHT ureterolithiasis. Stomach/Bowel: Stomach, small-bowel and cecum are normal. The appendix is not identified but there is no pericecal inflammation to suggest appendicitis. Multiple diverticula of the descending colon and sigmoid colon without acute inflammation. Vascular/Lymphatic: Abdominal aorta is normal caliber. No periportal or retroperitoneal adenopathy. No pelvic adenopathy. Reproductive: Post hysterectomy Other: No free fluid. Musculoskeletal: No aggressive osseous lesion. IMPRESSION: 1. Obstructing calculus at the LEFT ureteropelvic junction. Mild LEFT hydronephrosis and renal edema. 2. LEFT nephrolithiasis. 3. LEFT colon diverticulosis without evidence diverticulitis. Electronically Signed   By: Suzy Bouchard M.D.   On: 01/13/2019 10:29   Nm Pulmonary Perfusion  Result Date: 01/13/2019 CLINICAL DATA:  Shortness of breath. EXAM: NUCLEAR MEDICINE PERFUSION LUNG SCAN TECHNIQUE: Perfusion images were obtained in multiple projections after intravenous injection of radiopharmaceutical. Ventilation scans intentionally deferred if perfusion scan and chest x-ray adequate for interpretation during COVID 19 epidemic. RADIOPHARMACEUTICALS:  1.5 mCi Tc-35mMAA IV COMPARISON:  Chest x-ray January 12, 2019 FINDINGS: No segmental defects to suggest pulmonary emboli. IMPRESSION: No evidence of pulmonary embolus on this study. Electronically Signed   By: DDorise BullionIII M.D   On: 01/13/2019 02:32   Dg Chest Portable 1 View  Result Date: 01/12/2019 CLINICAL DATA:  Weakness.  Shortness of breath. EXAM: PORTABLE CHEST 1 VIEW COMPARISON:  November 08, 2010 FINDINGS:  Cardiomegaly. The hila and mediastinum are normal. No pneumothorax. Haziness over the bases may simply be due to overlapping soft tissues and patient body habitus. No definitive infiltrate identified. No over pulmonary edema noted. IMPRESSION: The study is limited due to patient body habitus. Within this limitation, no focal infiltrates or overt edema are identified. Electronically Signed   By: DDorise BullionIII M.D   On: 01/12/2019 23:46    Pending Labs Unresulted Labs (From admission, onward)    Start     Ordered   01/14/19 0500  CBC  Tomorrow morning,   R     01/13/19  0518   01/14/19 0500  Magnesium  Tomorrow morning,   R     01/13/19 1037   01/14/19 0500  Phosphorus  Tomorrow morning,   R     01/13/19 1037   01/14/19 0500  Comprehensive metabolic panel  Tomorrow morning,   R     01/13/19 1037   01/14/19 0500  CBC with Differential/Platelet  Tomorrow morning,   R     01/13/19 1037   01/13/19 0804  Culture, Urine  Once,   STAT     01/13/19 0803   01/13/19 0003  Blood culture (routine x 2)  BLOOD CULTURE X 2,   STAT     01/13/19 0003          Vitals/Pain Today's Vitals   01/13/19 1545 01/13/19 1600 01/13/19 1615 01/13/19 1630  BP:  127/83  126/75  Pulse: 67 66 67 66  Resp: 17 (!) 21 (!) 33 (!) 29  Temp:      TempSrc:      SpO2: 91% 92% 95% 92%  Weight:      Height:      PainSc:        Isolation Precautions No active isolations  Medications Medications  0.9 %  sodium chloride infusion ( Intravenous Stopped 30/1/31 4388)  folic acid (FOLVITE) tablet 1 mg (1 mg Oral Given 01/13/19 0940)  acetaminophen (TYLENOL) tablet 650 mg (650 mg Oral Given 01/13/19 0940)  aspirin EC tablet 81 mg (81 mg Oral Given 01/13/19 0941)  insulin aspart (novoLOG) injection 0-15 Units (11 Units Subcutaneous Given 01/13/19 1234)  insulin aspart (novoLOG) injection 6 Units (6 Units Subcutaneous Given 01/13/19 1233)  cefTRIAXone (ROCEPHIN) 1 g in sodium chloride 0.9 % 100 mL IVPB (has no  administration in time range)  rivaroxaban (XARELTO) tablet 20 mg (has no administration in time range)  carvedilol (COREG) tablet 3.125 mg (3.125 mg Oral Given 01/13/19 1222)  sotalol (BETAPACE) tablet 80 mg (80 mg Oral Given 01/13/19 1222)  pantoprazole (PROTONIX) EC tablet 40 mg (40 mg Oral Given 01/13/19 1221)  simvastatin (ZOCOR) tablet 10 mg (has no administration in time range)  tamsulosin (FLOMAX) capsule 0.4 mg (has no administration in time range)  insulin glargine (LANTUS) injection 24 Units (24 Units Subcutaneous Given 01/13/19 1235)  psyllium (HYDROCIL/METAMUCIL) packet 1 packet (1 packet Oral Given 01/13/19 1330)  enoxaparin (LOVENOX) injection 120 mg (120 mg Subcutaneous Given 01/13/19 0145)  albuterol (VENTOLIN HFA) 108 (90 Base) MCG/ACT inhaler 8 puff (8 puffs Inhalation Given 01/13/19 0020)  acetaminophen (TYLENOL) tablet 1,000 mg (1,000 mg Oral Given 01/13/19 0020)  cefTRIAXone (ROCEPHIN) 1 g in sodium chloride 0.9 % 100 mL IVPB (0 g Intravenous Stopped 01/13/19 0537)  insulin aspart (novoLOG) injection 4 Units (4 Units Subcutaneous Given 01/13/19 0301)  sodium chloride 0.9 % 1,000 mL with thiamine 875 mg, folic acid 1 mg, multivitamins adult 10 mL infusion ( Intravenous Rate/Dose Verify 01/13/19 1637)    Mobility walks

## 2019-01-13 NOTE — ED Notes (Addendum)
Pt ambulated in room on RA - pt became SHOB and tachypneic with O2 saturations dropping to 92% on RA.   Dr. Randal Buba made aware.

## 2019-01-13 NOTE — Progress Notes (Signed)
Care started prior to midnight in the emergency room yesterday and patient was admitted early this morning after midnight by Dr. Hulan Saas and I am in current agreement with his assessment and plan.  Additional changes of the plan of care been made accordingly.  Patient is a obese African-American female with past medical history significant for but not limited to benign essential hypertension, chronic diastolic CHF and dilated cardiomyopathy, GERD, hyperlipidemia, history of nephrolithiasis, morbid obesity, OSA on CPAP, persistent atrial fibrillation, type 2 diabetes mellitus, vitamin D deficiency and other comorbidities who presents with a chief complaint of weakness which is global, dry cough, night sweats and increased sugars.  She reports that she is also had diarrhea recently.  She states that symptoms have been going on for a little while now for at least 1 to 2 weeks and she has been more fatigued.  She presented to the emergency room for weakness and she is being admitted and treated for the following but not limited to:  UTI in the setting of suspected Nephrolithiasis -WBC went from 14.6 is now 19.4 -Urinalysis showed a hazy appearance with greater than 500 glucose, large hemoglobin, 5 ketones, large leukocytes, no bacteria seen, greater than 50 RBCs per high-power field, and greater than 50 WBCs Patient is complaining of low back pain more so on the left side compared to the right -Blood cultures x2 were obtained however unfortunately urine cultures not obtained until after she was started on antibiotics -Received IV ceftriaxone which we will continue for now -Reduce fluid rate to 75 mL's per hour to 12 more hours given her history of CHF -Check CT of the abdomen pelvis without contrast  Persistent Atrial Fibrillation currently in NSR -Continue with carvedilol and sotalol -Continue with rivaroxaban -She received subcu Lovenox last night -cardiology is consulted for further evaluation  recommendations  Uncontrolled Diabetes Mellitus Type 2  -Hemoglobin A1c was 8.7 -Takes Metformin and glimepiride at home which have both been held  -In the setting of infection -Patient blood sugar on admission was 465 and repeat was 451 and CBGs ranging from 3 39-424  -Continue with moderate NovoLog/scale insulin and was given insulin aspart 6 units 3 times daily with meals and also given Lantus 10 units subcu nightly For diabetes education coordinator consulted for further evaluation recommendations  Dilated Cardiomyopathy and history of Diastolic CHF with an EF of 45 to 50% -Continue with carvedilol and hold spironolactone for now -Strict I's and O's and daily weights  -BNP was 242.1 on admission and troponin was initially elevated at 48 and trending down to 33 -Cardiology is consulted for further evaluation recommendations they suspect that her troponin elevation in the context of her infection next-date recommend no further cardiac work-up is indicated and recommending diuresing gently however given her dehydration we will continue fluids for today and resume her spironolactone in the a.m. -Continue to monitor for signs symptoms of volume overload -Repeat chest x-ray in the a.m.  AKI on CKD Stage 3b Metabolic Acidosis -In the setting of Infection and Dehydration -Patient's BUN/creatinine went from 32/1.81 and is now 30/1.49 -Hold Spironolactone for now -patient CO2 is 19, chloride level 102, and anion gap was 12 -Given IVF with NS but reduced rate to 75 mL/hr and will stop after 12 hours -Avoid nephrotoxic medications, contrast dyes, hypotension if possible and renally adjust medications next-continue monitor trend renal function next-repeat CMP in the a.m.  Generalized Weakness likely in the setting of infection rule out other etiologies -Get PT/OT  evaluation -Of note SARS-CoV-2 testing was negative -Gentle IVF Hydration with normal saline at a rate of 75 mL's per hour for another  12 more hours -Continue to treat infection and follow clinical response  HLD -Resume Statin   Hyponatremia -Mild and trending upwards next-patient sodium went from 131 is now 133  -Continue with gentle IV fluid hydration with normal saline at 75 mL's per hour for another 12 hours times -Continue to monitor and trend and repeat CMP in a.m.   Morbid Obesity -Estimated body mass index is 49.75 kg/m as calculated from the following:   Height as of this encounter: 5\' 2"  (1.575 m).   Weight as of this encounter: 123.4 kg. -Weight Loss and Dietary Counseling given  OSA -Will order CPAP  We will continue to monitor patient's clinical response to intervention and repeat blood work and imaging in the a.m and follow-up on imaging studies and Cardiology recommendations.

## 2019-01-13 NOTE — Consult Note (Signed)
Cardiology Consultation:   Patient ID: Karen Rasmussen MRN: [id number]; DOB: 09/11/1945  Admit date: 01/12/2019 Date of Consult: 01/13/2019  Primary Care Provider: Kathyrn Lass, MD Primary Cardiologist: Fransico Him, MD     Patient Profile:   [name] is a 73 y.o. female with a hx of AFib-persistent on Rivaroxaban and sotalol (2018) , EF 45-50%; abnormal myoview but CaScore 0 who is being seen today for the evaluation ofElevated troponinat the request of Dr HAq.  History of Present Illness:   Karen Rasmussen lady with known mild cardiomyopathy persistent atrial fibrillation as noted above who presents with weakness and fevers.  She is found to have UTI and presumed pyelonephritis.  Troponins were borderline elevated.  Calcium score done 2 years ago was normal.  No exertional chest discomfort.  Limited chest imaging demonstrates no obvious heart failure but BNP is modestly elevated.  Heart Pathway Score:     Past Medical History:  Diagnosis Date  . Abdominal pain, left lower quadrant   . Asthmatic bronchitis   . Benign essential HTN 04/26/2016  . Benign hypertensive heart disease without heart failure   . Bursitis of hip   . Chronic diastolic CHF (congestive heart failure) ([place])   . DCM (dilated cardiomyopathy) ([place])    EF 45-50%  . GERD (gastroesophageal reflux disease)   . Heart murmur   . High cholesterol   . History of kidney stones   . Hypersomnia   . Morbid obesity with BMI of 50.0-59.9, adult ([place])   . OSA on CPAP   . Osteoarthritis    "right hip; both knees" (09/21/2016)  . Persistent atrial fibrillation ([place]) 04/26/2016  . Situational depression 2003   "when I was going thru my divorce"  . Type II diabetes mellitus ([place])   . Vitamin D deficiency disease     Past Surgical History:  Procedure Laterality Date  . ABDOMINAL HYSTERECTOMY    . APPENDECTOMY    . BALLOON DILATION N/A 11/17/2017   Procedure: BALLOON DILATION;  Surgeon: Ronnette Juniper, MD;  Location: [facility]  ENDOSCOPY;  Service: Gastroenterology;  Laterality: N/A;  . BIOPSY  11/17/2017   Procedure: BIOPSY;  Surgeon: Ronnette Juniper, MD;  Location: [facility] ENDOSCOPY;  Service: Gastroenterology;;  . CARDIAC CATHETERIZATION    . CARDIOVERSION N/A 06/11/2016   Procedure: CARDIOVERSION;  Surgeon: Dorothy Spark, MD;  Location: [facility] ENDOSCOPY;  Service: Cardiovascular;  Laterality: N/A;  . CARDIOVERSION N/A 07/16/2016   Procedure: CARDIOVERSION;  Surgeon: Thayer Headings, MD;  Location[facility] ENDOSCOPY;  Service: Cardiovascular;  Laterality: N/A;  . CARDIOVERSION N/A 09/23/2016   Procedure: CARDIOVERSION;  Surgeon: Sanda Klein, MD;  Location: [facility] ENDOSCOPY;  Service: Cardiovascular;  Laterality: N/A;  . CHOLECYSTECTOMY OPEN    . COLONOSCOPY N/A 11/17/2017   Procedure: COLONOSCOPY;  Surgeon: Ronnette Juniper, MD;  Location: [facility] ENDOSCOPY;  Service: Gastroenterology;  Laterality: N/A;  . costisone injection to left knee  11/03/2017  . DILATION AND CURETTAGE OF UTERUS     S/P miscarriage  . ESOPHAGOGASTRODUODENOSCOPY N/A 11/17/2017   Procedure: ESOPHAGOGASTRODUODENOSCOPY (EGD);  Surgeon: Ronnette Juniper, MD;  Location: [facility] ENDOSCOPY;  Service: Gastroenterology;  Laterality: N/A;  . NASAL SEPTUM SURGERY    . POLYPECTOMY  11/17/2017   Procedure: POLYPECTOMY;  Surgeon: Ronnette Juniper, MD;  Location: [facility] ENDOSCOPY;  Service: Gastroenterology;;  . TONSILLECTOMY      No current facility-administered medications on file prior to encounter.    Current Outpatient Medications on File Prior to Encounter  Medication Sig Dispense Refill  .  acetaminophen (TYLENOL) 650 MG CR tablet Take 650 mg by mouth 2 (two) times daily as needed for pain.    Marland Kitchen alendronate (FOSAMAX) 70 MG tablet Take 70 mg by mouth every Sunday. Take with a full glass of water on an empty stomach.     . Biotin w/ Vitamins C & E (HAIR SKIN & NAILS GUMMIES PO) Take 2 tablets by mouth daily.     Marland Kitchen CALCIUM PO Take 650 mg by mouth daily.    . carvedilol (COREG) 3.125 MG  tablet Take 1 tablet (3.125 mg total) by mouth 2 (two) times daily with a meal. 180 tablet 3  . Cholecalciferol (VITAMIN D3) 5000 units CAPS Take 5,000 Units by mouth daily.    . diphenhydrAMINE-zinc acetate (BENADRYL) cream Apply 1 application topically 3 (three) times daily as needed for itching.    Marland Kitchen glimepiride (AMARYL) 4 MG tablet Take 8 mg by mouth daily with breakfast.     . HYDROcodone-acetaminophen (NORCO/VICODIN) 5-325 MG tablet Take 1 tablet by mouth every 8 (eight) hours as needed for moderate pain.     . metFORMIN (GLUCOPHAGE) 500 MG tablet Take 500 mg by mouth 2 (two) times daily with a meal.     . Misc Natural Products (GLUCOSAMINE CHONDROITIN TRIPLE) TABS Take 2 tablets by mouth daily.     . Multiple Vitamins-Minerals (CENTRUM SILVER) CHEW Chew 2 tablets by mouth daily. soft chews    . pantoprazole (PROTONIX) 40 MG tablet Take 40 mg by mouth daily.    . potassium chloride (K-DUR,KLOR-CON) 10 MEQ tablet TAKE 2 TABLETS BY MOUTH TWICE DAILY (Patient taking differently: Take 20 mEq by mouth 2 (two) times daily. ) 360 tablet 3  . psyllium (METAMUCIL SMOOTH TEXTURE) 28 % packet Take 1 packet by mouth 2 (two) times daily.     . rivaroxaban (XARELTO) 20 MG TABS tablet Take 1 tablet (20 mg total) by mouth every evening. 90 tablet 3  . simvastatin (ZOCOR) 10 MG tablet Take 1 tablet (10 mg total) by mouth at bedtime. 90 tablet 3  . sotalol (BETAPACE) 80 MG tablet TAKE 1 TABLET(80 MG) BY MOUTH EVERY 12 HOURS (Patient taking differently: Take 80 mg by mouth 2 (two) times daily. ) 245 tablet 3  . spironolactone (ALDACTONE) 25 MG tablet TAKE 1/2 TABLET (12.5MG     TOTAL) DAILY (Patient taking differently: Take 12.5 mg by mouth daily. ) 45 tablet 3  . tamsulosin (FLOMAX) 0.4 MG CAPS capsule Take 0.4 mg by mouth daily.    Marland Kitchen triamcinolone cream (KENALOG) 0.1 % Apply 1 application topically 2 (two) times daily as needed (itching legs).          Inpatient Medications: Scheduled Meds: . aspirin EC   81 mg Oral Daily  . folic acid  1 mg Oral Daily  . heparin  5,000 Units Subcutaneous Q8H  . insulin aspart  0-15 Units Subcutaneous TID WC  . insulin aspart  6 Units Subcutaneous TID WC  . insulin glargine  10 Units Subcutaneous QHS   Continuous Infusions: . sodium chloride Stopped (01/13/19 0557)  . cefTRIAXone (ROCEPHIN)  IV     PRN Meds: acetaminophen  Allergies:    Allergies  Allergen Reactions  . No [employer] Food Allergies     Defibrillator pads cause rash.    Social History:   Social History   Socioeconomic History  . Marital status: Divorced    Spouse name: Not on file  . Number of children: Not on file  . Years  of education: Not on file  . Highest education level: Not on file  Occupational History  . Not on file  Social Needs  . Financial resource strain: Not on file  . Food insecurity    Worry: Not on file    Inability: Not on file  . Transportation needs    Medical: Not on file    Non-medical: Not on file  Tobacco Use  . Smoking status: Never Smoker  . Smokeless tobacco: Never Used  Substance and Sexual Activity  . Alcohol use: Yes    Comment: 09/21/2016 "might have a glass of wine once/year; if that"  . Drug use: No  . Sexual activity: Never  Lifestyle  . Physical activity    Days per week: Not on file    Minutes per session: Not on file  . Stress: Not on file  Relationships  . Social Herbalist on phone: Not on file    Gets together: Not on file    Attends religious service: Not on file    Active member of club or organization: Not on file    Attends meetings of clubs or organizations: Not on file    Relationship status: Not on file  . Intimate partner violence    Fear of current or ex partner: Not on file    Emotionally abused: Not on file    Physically abused: Not on file    Forced sexual activity: Not on file  Other Topics Concern  . Not on file  Social History Narrative  . Not on file    Family History:     Family  History  Problem Relation Age of Onset  . Cancer Father   . Heart failure Father   . Diabetes Father   . CAD Mother   . Diabetes Brother   . Hypertension Brother   . Breast cancer Neg Hx      ROS:  Please see the history of present illness.    All other ROS reviewed and negative.     Physical Exam/Data:   Vitals:   01/13/19 0430 01/13/19 0500 01/13/19 0530 01/13/19 0600  BP: 131/62 119/64 (!) 124/58 123/69  Pulse: 72 72 71 72  Resp: (!) 22 16 (!) 24 (!) 23  Temp:      TempSrc:      SpO2: 92% 92% 91% 94%  Weight:      Height:        Intake/Output Summary (Last 24 hours) at 01/13/2019 0834 Last data filed at 01/13/2019 0739 Gross per 24 hour  Intake 100 ml  Output 650 ml  Net -550 ml   Last 3 Weights 01/13/2019 10/20/2018 04/04/2018  Weight (lbs) 272 lb (No Data) 281 lb 1.9 oz  Weight (kg) 123.378 kg (No Data) 127.515 kg     Body mass index is 49.75 kg/m.  General:  Well nourished, well developed, in no acute distress  HEENT: normal Lymph: no adenopathy Neck: no JVD Endocrine:  No thryomegaly Vascular: No carotid bruits; FA pulses 2+ bilaterally without bruits  Cardiac:  normal S1, S2; RRR; no murmur   Lungs:  clear to auscultation bilaterally, no wheezing, rhonchi or rales  Abd: soft, nontender, no hepatomegaly  Ext: no edema Musculoskeletal:  No deformities, BUE and BLE strength normal and equal Skin: warm and dry  Neuro:  CNs 2-12 intact, no focal abnormalities noted Psych:  Normal affect   EKG:  The EKG was personally reviewed and demonstrates:  Sinus @  79 with freq PACs 14/11/40  Axis -60  PRWP TWI avL  Personally reviewed  09/20 >>similar x TWI avL   Relevant CV Studies:    Laboratory Data:  High Sensitivity Troponin:   Recent Labs  Lab 01/12/19 2259 01/13/19 0223 01/13/19 0542  TROPONINIHS 48* 45* 33*     Chemistry Recent Labs  Lab 01/12/19 2259 01/13/19 0542  NA 131* 133*  K 4.6 4.9  CL 101 102  CO2 19* 19*  GLUCOSE 465* 451*  BUN  32* 30*  CREATININE 1.81* 1.49*  CALCIUM 8.2* 8.0*  GFRNONAA 28* 35*  GFRAA 32* 41*  ANIONGAP 11 12    Recent Labs  Lab 01/13/19 0542  PROT 6.1*  ALBUMIN 2.5*  AST 16  ALT 33  ALKPHOS 123  BILITOT 0.6   Hematology Recent Labs  Lab 01/12/19 2259 01/13/19 0542  WBC 14.6* 19.4*  RBC 4.13 3.82*  HGB 11.8* 10.9*  HCT 37.3 34.5*  MCV 90.3 90.3  MCH 28.6 28.5  MCHC 31.6 31.6  RDW 13.5 13.7  PLT 228 230   BNP Recent Labs  Lab 01/13/19 0542  BNP 242.1*    DDimer No results for input(s): DDIMER in the last 168 hours.   Radiology/Studies:  Nm Pulmonary Perfusion  Result Date: 01/13/2019 CLINICAL DATA:  Shortness of breath. EXAM: NUCLEAR MEDICINE PERFUSION LUNG SCAN TECHNIQUE: Perfusion images were obtained in multiple projections after intravenous injection of radiopharmaceutical. Ventilation scans intentionally deferred if perfusion scan and chest x-ray adequate for interpretation during COVID 19 epidemic. RADIOPHARMACEUTICALS:  1.5 mCi Tc-46m MAA IV COMPARISON:  Chest x-ray January 12, 2019 FINDINGS: No segmental defects to suggest pulmonary emboli. IMPRESSION: No evidence of pulmonary embolus on this study. Electronically Signed   By: Dorise Bullion III M.D   On: 01/13/2019 02:32   Dg Chest Portable 1 View  Result Date: 01/12/2019 CLINICAL DATA:  Weakness.  Shortness of breath. EXAM: PORTABLE CHEST 1 VIEW COMPARISON:  November 08, 2010 FINDINGS: Cardiomegaly. The hila and mediastinum are normal. No pneumothorax. Haziness over the bases may simply be due to overlapping soft tissues and patient body habitus. No definitive infiltrate identified. No over pulmonary edema noted. IMPRESSION: The study is limited due to patient body habitus. Within this limitation, no focal infiltrates or overt edema are identified. Electronically Signed   By: Dorise Bullion III M.D   On: 01/12/2019 23:46    Assessment and Plan:   1. Tn elevated  2. No known CAD ( Ca score 0 2018 )  Power of  0 3. Cardiomyopathy 4. Elevated BNP  5. Weakness  6. Leukocytosis  7. Afib persistent 8. High Risk Medication Surveillance -Sotalol /Aldactone   Patient has borderline elevated troponins not consistent with acute coronary syndrome.  In the context of a calcium score of 0, the likelihood that this is a cardiac event is exceedingly low.  Suspect in the context of her infection.  Would diurese gently.  No further cardiac work-up is indicated.  Important to maintain normokalemia on her sotalol.  ECG currently is with normal QTC.  Call us if we can be of further assistance  [facility] will sign off.   Medication Recommendations:  none y  Other recommendations (labs, testing, etc):  none Follow up as an outpatient:  As previously scheduled   For questions or updates, please contact [place] Please consult [email or web address] for contact info under     Signed, Virl Axe, MD  01/13/2019 8:34 AM

## 2019-01-13 NOTE — ED Provider Notes (Signed)
[facility] DEPT Provider Note   CSN: [id number] Arrival date & time: 01/12/19  2227     History   Chief Complaint Chief Complaint  Patient presents with  . Weakness  . Hyperglycemia    HPI Karen Rasmussen is a 73 y.o. female.     The history is provided by the patient.  Weakness Severity:  Moderate Onset quality:  Gradual Timing:  Constant Progression:  Worsening Chronicity:  New Context: not alcohol use and not stress   Relieved by:  Nothing Worsened by:  Nothing Ineffective treatments:  None tried Associated symptoms: cough, diarrhea, fever, shortness of breath and vision change   Associated symptoms: no abdominal pain, no aphasia, no ataxia, no chest pain, no drooling, no dysphagia, no numbness in extremities, no loss of consciousness, no myalgias, no seizures, no stroke symptoms and no vomiting   Hyperglycemia Associated symptoms: fatigue, fever, shortness of breath and weakness   Associated symptoms: no abdominal pain, no chest pain and no vomiting   Patient with DM presents with 1 weeks of global weakness, dry cough, night sweats and increased sugars.  She has had diarrhea also but states she has this not infrequently.  She denies anosmia or loss of taste.  She denies covid exposures.  She has not been using her CPAP for the past week due to her symptoms.    Past Medical History:  Diagnosis Date  . Abdominal pain, left lower quadrant   . Asthmatic bronchitis   . Benign essential HTN 04/26/2016  . Benign hypertensive heart disease without heart failure   . Bursitis of hip   . Chronic diastolic CHF (congestive heart failure) ([place])   . DCM (dilated cardiomyopathy) ([place])    EF 45-50%  . GERD (gastroesophageal reflux disease)   . Heart murmur   . High cholesterol   . History of kidney stones   . Hypersomnia   . Morbid obesity with BMI of 50.0-59.9, adult ([place])   . OSA on CPAP   . Osteoarthritis    "right hip; both knees" (09/21/2016)   . Persistent atrial fibrillation ([place]) 04/26/2016  . Situational depression 2003   "when I was going thru my divorce"  . Type II diabetes mellitus ([place])   . Vitamin D deficiency disease     Patient Active Problem List   Diagnosis Date Noted  . Preoperative clearance 11/02/2017  . Hypokalemia 09/23/2016  . Encounter for monitoring sotalol therapy 09/21/2016  . DCM (dilated cardiomyopathy) ([place])   . Benign essential HTN 04/26/2016  . Persistent atrial fibrillation ([place]) 04/26/2016  . Morbid obesity with BMI of 40.0-44.9, adult ([place])   . Chronic diastolic CHF (congestive heart failure) ([place])   . Abdominal pain, left lower quadrant   . OSA (obstructive sleep apnea)   . Benign hypertensive heart disease without heart failure   . GERD (gastroesophageal reflux disease)   . Hypersomnia   . Depression   . Asthmatic bronchitis   . Bursitis of hip     Past Surgical History:  Procedure Laterality Date  . ABDOMINAL HYSTERECTOMY    . APPENDECTOMY    . BALLOON DILATION N/A 11/17/2017   Procedure: BALLOON DILATION;  Surgeon: Ronnette Juniper, MD;  Location: [facility];  Service: Gastroenterology;  Laterality: N/A;  . BIOPSY  11/17/2017   Procedure: BIOPSY;  Surgeon: Ronnette Juniper, MD;  Location: [facility] ENDOSCOPY;  Service: Gastroenterology;;  . CARDIAC CATHETERIZATION    . CARDIOVERSION N/A 06/11/2016   Procedure: CARDIOVERSION;  Surgeon: Meda Coffee,  Jamse Belfast, MD;  Location: [facility] ENDOSCOPY;  Service: Cardiovascular;  Laterality: N/A;  . CARDIOVERSION N/A 07/16/2016   Procedure: CARDIOVERSION;  Surgeon: Thayer Headings, MD;  Location: [facility] ENDOSCOPY;  Service: Cardiovascular;  Laterality: N/A;  . CARDIOVERSION N/A 09/23/2016   Procedure: CARDIOVERSION;  Surgeon: Sanda Klein, MD;  Location: [facility] ENDOSCOPY;  Service: Cardiovascular;  Laterality: N/A;  . CHOLECYSTECTOMY OPEN    . COLONOSCOPY N/A 11/17/2017   Procedure: COLONOSCOPY;  Surgeon: Ronnette Juniper, MD;  Location: [facility] ENDOSCOPY;  Service:  Gastroenterology;  Laterality: N/A;  . costisone injection to left knee  11/03/2017  . DILATION AND CURETTAGE OF UTERUS     S/P miscarriage  . ESOPHAGOGASTRODUODENOSCOPY N/A 11/17/2017   Procedure: ESOPHAGOGASTRODUODENOSCOPY (EGD);  Surgeon: Ronnette Juniper, MD;  Location: [facility] ENDOS[facility];  Service: Gastroenterology;  Laterality: N/A;  . NASAL SEPTUM SURGERY    . POLYPECTOMY  11/17/2017   Procedure: POLYPECTOMY;  Surgeon: Ronnette Juniper, MD;  Location: [facility] ENDOSCOPY;  Service: Gastroenterology;;  . TONSILLECTOMY       OB History   No obstetric history on file.      Home Medications    Prior to Admission medications   Medication Sig Start Date End Date Taking? Authorizing Provider  acetaminophen (TYLENOL) 650 MG CR tablet Take 650 mg by mouth 2 (two) times daily as needed for pain.   Yes [provider]  alendronate (FOSAMAX) 70 MG tablet Take 70 mg by mouth every Sunday. Take with a full glass of water on an empty stomach.    Yes [provider]  Biotin w/ Vitamins C & E (HAIR SKIN & NAILS GUMMIES PO) Take 2 tablets by mouth daily.    Yes [provider]  CALCIUM PO Take 650 mg by mouth daily.   Yes [provider]  carvedilol (COREG) 3.125 MG tablet Take 1 tablet (3.125 mg total) by mouth 2 (two) times daily with a meal. 04/04/18 03/30/19 Yes Turner, Eber Hong, MD  Cholecalciferol (VITAMIN D3) 5000 units CAPS Take 5,000 Units by mouth daily.   Yes [provider]  diphenhydrAMINE-zinc acetate (BENADRYL) cream Apply 1 application topically 3 (three) times daily as needed for itching.   Yes [provider]  glimepiride (AMARYL) 4 MG tablet Take 8 mg by mouth daily with breakfast.    Yes [provider]  HYDROcodone-acetaminophen (NORCO/VICODIN) 5-325 MG tablet Take 1 tablet by mouth every 8 (eight) hours as needed for moderate pain.    Yes [provider]  metFORMIN (GLUCOPHAGE) 500 MG tablet Take 500 mg by mouth 2 (two) times  daily with a meal.    Yes [provider]  Misc Natural Products (GLUCOSAMINE CHONDROITIN TRIPLE) TABS Take 2 tablets by mouth daily.    Yes [provider]  Multiple Vitamins-Minerals (CENTRUM SILVER) CHEW Chew 2 tablets by mouth daily. soft chews   Yes [provider]  pantoprazole (PROTONIX) 40 MG tablet Take 40 mg by mouth daily.   Yes [provider]  potassium chloride (K-DUR,KLOR-CON) 10 MEQ tablet TAKE 2 TABLETS BY MOUTH TWICE DAILY Patient taking differently: Take 20 mEq by mouth 2 (two) times daily.  04/04/18  Yes Turner, Traci R, MD  psyllium (METAMUCIL SMOOTH TEXTURE) 28 % packet Take 1 packet by mouth 2 (two) times daily.    Yes [provider]  rivaroxaban (XARELTO) 20 MG TABS tablet Take 1 tablet (20 mg total) by mouth every evening. 04/04/18 04/04/19 Yes Turner, Eber Hong, MD  simvastatin (ZOCOR) 10 MG tablet  Take 1 tablet (10 mg total) by mouth at bedtime. 04/04/18 04/04/19 Yes Turner, Eber Hong, MD  sotalol (BETAPACE) 80 MG tablet TAKE 1 TABLET(80 MG) BY MOUTH EVERY 12 HOURS Patient taking differently: Take 80 mg by mouth 2 (two) times daily.  04/04/18  Yes Turner, Eber Hong, MD  spironolactone (ALDACTONE) 25 MG tablet TAKE 1/2 TABLET (12.5MG     TOTAL) DAILY Patient taking differently: Take 12.5 mg by mouth daily.  10/30/18  Yes Turner, Eber Hong, MD  tamsulosin (FLOMAX) 0.4 MG CAPS capsule Take 0.4 mg by mouth daily.   Yes [provider]  triamcinolone cream (KENALOG) 0.1 % Apply 1 application topically 2 (two) times daily as needed (itching legs).  12/15/18  Yes [provider]    Family History Family History  Problem Relation Age of Onset  . Cancer Father   . Heart failure Father   . Diabetes Father   . CAD Mother   . Diabetes Brother   . Hypertension Brother   . Breast cancer Neg Hx     Social History Social History   Tobacco Use  . Smoking status: Never Smoker  . Smokeless tobacco: Never Used  Substance Use  Topics  . Alcohol use: Yes    Comment: 09/21/2016 "might have a glass of wine once/year; if that"  . Drug use: No     Allergies   No healthtouch food allergies   Review of Systems Review of Systems  Constitutional: Positive for fatigue and fever.  HENT: Negative for drooling.   Eyes: Negative for visual disturbance.  Respiratory: Positive for cough and shortness of breath.   Cardiovascular: Negative for chest pain, palpitations and leg swelling.  Gastrointestinal: Positive for diarrhea. Negative for abdominal pain, dysphagia and vomiting.  Genitourinary: Negative for difficulty urinating.  Musculoskeletal: Negative for myalgias and neck pain.  Skin: Negative for rash.  Neurological: Positive for weakness. Negative for seizures, loss of consciousness, speech difficulty and numbness.  Psychiatric/Behavioral: Negative for agitation.  All other systems reviewed and are negative.    Physical Exam Updated Vital Signs BP (!) 132/59   Pulse 76   Temp 100.3 F (37.9 C)   Resp (!) 23   Ht 5\' 2"  (1.575 m)   Wt 123.4 kg   SpO2 96%   BMI 49.75 kg/m   Physical Exam Vitals signs and nursing note reviewed.  Constitutional:      General: She is not in acute distress.    Appearance: Normal appearance.  HENT:     Head: Normocephalic and atraumatic.     Nose: Nose normal.  Eyes:     Conjunctiva/sclera: Conjunctivae normal.     Pupils: Pupils are equal, round, and reactive to light.  Neck:     Musculoskeletal: Normal range of motion and neck supple.  Cardiovascular:     Rate and Rhythm: Normal rate and regular rhythm.     Pulses: Normal pulses.     Heart sounds: Normal heart sounds.  Pulmonary:     Effort: Pulmonary effort is normal.     Breath sounds: Normal breath sounds.  Abdominal:     General: Abdomen is flat. Bowel sounds are normal.     Tenderness: There is no abdominal tenderness. There is no guarding.  Musculoskeletal: Normal range of motion.  Skin:    General:  Skin is warm and dry.     Capillary Refill: Capillary refill takes less than 2 seconds.  Neurological:     General: No focal deficit present.  Mental Status: She is alert and oriented to person, place, and time.     Deep Tendon Reflexes: Reflexes normal.  Psychiatric:        Mood and Affect: Mood normal.        Behavior: Behavior normal.      ED Treatments / Results  Labs (all labs ordered are listed, but only abnormal results are displayed) Labs Reviewed  BASIC METABOLIC PANEL - Abnormal; Notable for the following components:      Result Value   Sodium 131 (*)    CO2 19 (*)    Glucose, Bld 465 (*)    BUN 32 (*)    Creatinine, Ser 1.81 (*)    Calcium 8.2 (*)    GFR calc non Af Amer 28 (*)    GFR calc Af Amer 32 (*)    All other components within normal limits  CBC - Abnormal; Notable for the following components:   WBC 14.6 (*)    Hemoglobin 11.8 (*)    All other components within normal limits  URINALYSIS, ROUTINE W REFLEX MICROSCOPIC - Abnormal; Notable for the following components:   APPearance HAZY (*)    Glucose, UA >=500 (*)    Hgb urine dipstick LARGE (*)    Ketones, ur 5 (*)    Leukocytes,Ua LARGE (*)    RBC / HPF >50 (*)    WBC, UA >50 (*)    All other components within normal limits  CBG MONITORING, ED - Abnormal; Notable for the following components:   Glucose-Capillary 412 (*)    All other components within normal limits  TROPONIN I (HIGH SENSITIVITY) - Abnormal; Notable for the following components:   Troponin I (High Sensitivity) 48 (*)    All other components within normal limits  SARS CORONAVIRUS 2 (TAT 6-24 HRS)  CULTURE, BLOOD (ROUTINE X 2)  CULTURE, BLOOD (ROUTINE X 2)  POC SARS CORONAVIRUS 2 AG -  ED  TROPONIN I (HIGH SENSITIVITY)    EKG EKG Interpretation  Date/Time:  Friday January 12 2019 23:41:09 EST Ventricular Rate:  79 PR Interval:    QRS Duration: 112 QT Interval:  411 QTC Calculation: 472 R Axis:   -60 Text  Interpretation: Sinus rhythm Atrial premature complexes Incomplete left bundle branch block Probable left ventricular hypertrophy Anterior Q waves, possibly due to LVH Confirmed by Randal Buba, Lonie Newsham ([id number]) on 01/13/2019 12:00:21 AM   Radiology Dg Chest Portable 1 View  Result Date: 01/12/2019 CLINICAL DATA:  Weakness.  Shortness of breath. EXAM: PORTABLE CHEST 1 VIEW COMPARISON:  November 08, 2010 FINDINGS: Cardiomegaly. The hila and mediastinum are normal. No pneumothorax. Haziness over the bases may simply be due to overlapping soft tissues and patient body habitus. No definitive infiltrate identified. No over pulmonary edema noted. IMPRESSION: The study is limited due to patient body habitus. Within this limitation, no focal infiltrates or overt edema are identified. Electronically Signed   By: Dorise Bullion III M.D   On: 01/12/2019 23:46    Procedures Procedures (including critical care time)  Medications Ordered in ED Medications  cefTRIAXone (ROCEPHIN) 1 g in sodium chloride 0.9 % 100 mL IVPB (has no administration in time range)  insulin aspart (novoLOG) injection 4 Units (has no administration in time range)  0.9 %  sodium chloride infusion (has no administration in time range)  enoxaparin (LOVENOX) injection 120 mg (120 mg Subcutaneous Given 01/13/19 0145)  albuterol (VENTOLIN HFA) 108 (90 Base) MCG/ACT inhaler 8 puff (8 puffs Inhalation Given 01/13/19 0020)  acetaminophen (TYLENOL) tablet 1,000 mg (1,000 mg Oral Given 01/13/19 0020)     Initial Impression / Assessment and Plan / ED Course    Karen Rasmussen was evaluated in Emergency Department on 01/13/2019 for the symptoms described in the history of present illness. She was evaluated in the context of the global COVID-19 pandemic, which necessitated consideration that the patient might be at risk for infection with the SARS-CoV-2 virus that causes COVID-19. Institutional protocols and algorithms that pertain to the evaluation of  patients at risk for COVID-19 are in a state of rapid change based on information released by regulatory bodies including the CDC and federal and state organizations. These policies and algorithms were followed during the patient's care in the ED.  Final Clinical Impressions(s) / ED Diagnoses   Will admit to medicine.  Patient will need cardiac enzymes cycled.  I have a suspicion of covid.  I have obtained a VQ scan which does not show defect.     Mayari Matus, MD 01/13/19 [phone]

## 2019-01-14 ENCOUNTER — Inpatient Hospital Stay (HOSPITAL_COMMUNITY): Payer: Medicare Other | Admitting: Certified Registered"

## 2019-01-14 ENCOUNTER — Encounter (HOSPITAL_COMMUNITY): Payer: Self-pay | Admitting: Certified Registered"

## 2019-01-14 ENCOUNTER — Inpatient Hospital Stay (HOSPITAL_COMMUNITY): Payer: Medicare Other

## 2019-01-14 ENCOUNTER — Encounter (HOSPITAL_COMMUNITY)
Admission: EM | Disposition: A | Discharge status: Home Health | Payer: Self-pay | Source: Home / Self Care | Attending: Internal Medicine

## 2019-01-14 DIAGNOSIS — N8110 Cystocele, unspecified: Secondary | ICD-10-CM | POA: Diagnosis not present

## 2019-01-14 DIAGNOSIS — N200 Calculus of kidney: Secondary | ICD-10-CM

## 2019-01-14 DIAGNOSIS — N201 Calculus of ureter: Secondary | ICD-10-CM | POA: Diagnosis not present

## 2019-01-14 DIAGNOSIS — R1032 Left lower quadrant pain: Secondary | ICD-10-CM

## 2019-01-14 DIAGNOSIS — N390 Urinary tract infection, site not specified: Secondary | ICD-10-CM | POA: Diagnosis not present

## 2019-01-14 DIAGNOSIS — N135 Crossing vessel and stricture of ureter without hydronephrosis: Secondary | ICD-10-CM | POA: Diagnosis not present

## 2019-01-14 HISTORY — PX: CYSTOSCOPY WITH RETROGRADE PYELOGRAM, URETEROSCOPY AND STENT PLACEMENT: SHX5789

## 2019-01-14 LAB — CBC WITH DIFFERENTIAL/PLATELET
Abs Immature Granulocytes: 0.32 10*3/uL — ABNORMAL HIGH (ref 0.00–0.07)
Basophils Absolute: 0.1 10*3/uL (ref 0.0–0.1)
Basophils Relative: 0 %
Eosinophils Absolute: 0.1 10*3/uL (ref 0.0–0.5)
Eosinophils Relative: 1 %
HCT: 34.4 % — ABNORMAL LOW (ref 36.0–46.0)
Hemoglobin: 10.4 g/dL — ABNORMAL LOW (ref 12.0–15.0)
Immature Granulocytes: 3 %
Lymphocytes Relative: 9 %
Lymphs Abs: 1.1 10*3/uL (ref 0.7–4.0)
MCH: 27.5 pg (ref 26.0–34.0)
MCHC: 30.2 g/dL (ref 30.0–36.0)
MCV: 91 fL (ref 80.0–100.0)
Monocytes Absolute: 0.9 10*3/uL (ref 0.1–1.0)
Monocytes Relative: 7 %
Neutro Abs: 10.4 10*3/uL — ABNORMAL HIGH (ref 1.7–7.7)
Neutrophils Relative %: 80 %
Platelets: 237 10*3/uL (ref 150–400)
RBC: 3.78 MIL/uL — ABNORMAL LOW (ref 3.87–5.11)
RDW: 13.7 % (ref 11.5–15.5)
WBC: 12.8 10*3/uL — ABNORMAL HIGH (ref 4.0–10.5)
nRBC: 0 % (ref 0.0–0.2)

## 2019-01-14 LAB — COMPREHENSIVE METABOLIC PANEL
ALT: 24 U/L (ref 0–44)
AST: 14 U/L — ABNORMAL LOW (ref 15–41)
Albumin: 2.5 g/dL — ABNORMAL LOW (ref 3.5–5.0)
Alkaline Phosphatase: 98 U/L (ref 38–126)
Anion gap: 11 (ref 5–15)
BUN: 21 mg/dL (ref 8–23)
CO2: 20 mmol/L — ABNORMAL LOW (ref 22–32)
Calcium: 8.2 mg/dL — ABNORMAL LOW (ref 8.9–10.3)
Chloride: 105 mmol/L (ref 98–111)
Creatinine, Ser: 1.43 mg/dL — ABNORMAL HIGH (ref 0.44–1.00)
GFR calc Af Amer: 43 mL/min — ABNORMAL LOW (ref >60)
GFR calc non Af Amer: 37 mL/min — ABNORMAL LOW (ref >60)
Glucose, Bld: 203 mg/dL — ABNORMAL HIGH (ref 70–99)
Potassium: 4.5 mmol/L (ref 3.5–5.1)
Sodium: 136 mmol/L (ref 135–145)
Total Bilirubin: 1 mg/dL (ref 0.3–1.2)
Total Protein: 6.1 g/dL — ABNORMAL LOW (ref 6.5–8.1)

## 2019-01-14 LAB — MAGNESIUM: Magnesium: 2.3 mg/dL (ref 1.7–2.4)

## 2019-01-14 LAB — PHOSPHORUS: Phosphorus: 2.4 mg/dL — ABNORMAL LOW (ref 2.5–4.6)

## 2019-01-14 LAB — GLUCOSE, CAPILLARY
Glucose-Capillary: 186 mg/dL — ABNORMAL HIGH (ref 70–99)
Glucose-Capillary: 238 mg/dL — ABNORMAL HIGH (ref 70–99)
Glucose-Capillary: 199 mg/dL — ABNORMAL HIGH (ref 70–99)
Glucose-Capillary: 191 mg/dL — ABNORMAL HIGH (ref 70–99)

## 2019-01-14 LAB — MRSA PCR SCREENING: MRSA by PCR: NEGATIVE

## 2019-01-14 SURGERY — CYSTOURETEROSCOPY, WITH RETROGRADE PYELOGRAM AND STENT INSERTION
Anesthesia: General | Site: Ureter | Laterality: Left

## 2019-01-14 MED ORDER — PROPOFOL 10 MG/ML IV BOLUS
INTRAVENOUS | Status: AC
  Filled 2019-01-14: qty 20

## 2019-01-14 MED ORDER — OXYCODONE HCL 5 MG PO TABS: 5.0000 mg | ORAL_TABLET | Freq: Once | ORAL | Status: DC | PRN

## 2019-01-14 MED ORDER — FENTANYL CITRATE (PF) 100 MCG/2ML IJ SOLN: 25.0000 ug | INTRAMUSCULAR | Status: DC | PRN

## 2019-01-14 MED ORDER — PROMETHAZINE HCL 25 MG/ML IJ SOLN: 6.2500 mg | INTRAMUSCULAR | Status: DC | PRN

## 2019-01-14 MED ORDER — PHENYLEPHRINE HCL-NACL 10-0.9 MG/250ML-% IV SOLN
INTRAVENOUS | Status: AC
  Filled 2019-01-14: qty 250

## 2019-01-14 MED ORDER — DEXAMETHASONE SODIUM PHOSPHATE 10 MG/ML IJ SOLN
INTRAMUSCULAR | Status: DC | PRN
  Administered 2019-01-14: 4 mg via INTRAVENOUS

## 2019-01-14 MED ORDER — FENTANYL CITRATE (PF) 100 MCG/2ML IJ SOLN
INTRAMUSCULAR | Status: AC
  Filled 2019-01-14: qty 2

## 2019-01-14 MED ORDER — K PHOS MONO-SOD PHOS DI & MONO 155-852-130 MG PO TABS
500.0000 mg | ORAL_TABLET | Freq: Once | ORAL | Status: AC
  Administered 2019-01-14: 500 mg via ORAL
  Filled 2019-01-14 (×2): qty 2

## 2019-01-14 MED ORDER — PROPOFOL 10 MG/ML IV BOLUS
INTRAVENOUS | Status: DC | PRN
  Administered 2019-01-14: 160 mg via INTRAVENOUS

## 2019-01-14 MED ORDER — ONDANSETRON HCL 4 MG/2ML IJ SOLN
INTRAMUSCULAR | Status: DC | PRN
  Administered 2019-01-14: 4 mg via INTRAVENOUS

## 2019-01-14 MED ORDER — ACETAMINOPHEN 500 MG PO TABS
1000.0000 mg | ORAL_TABLET | Freq: Once | ORAL | Status: AC
  Administered 2019-01-14: 1000 mg via ORAL
  Filled 2019-01-14: qty 2

## 2019-01-14 MED ORDER — ALBUTEROL SULFATE (2.5 MG/3ML) 0.083% IN NEBU
2.5000 mg | INHALATION_SOLUTION | Freq: Once | RESPIRATORY_TRACT | Status: AC
  Administered 2019-01-14: 2.5 mg via RESPIRATORY_TRACT

## 2019-01-14 MED ORDER — SODIUM CHLORIDE 0.9 % IR SOLN
Status: DC | PRN
  Administered 2019-01-14: 3000 mL

## 2019-01-14 MED ORDER — PHENYLEPHRINE HCL-NACL 10-0.9 MG/250ML-% IV SOLN
INTRAVENOUS | Status: DC | PRN
  Administered 2019-01-14: 25 ug/min via INTRAVENOUS

## 2019-01-14 MED ORDER — ALBUTEROL SULFATE (2.5 MG/3ML) 0.083% IN NEBU
INHALATION_SOLUTION | RESPIRATORY_TRACT | Status: AC
  Filled 2019-01-14: qty 3

## 2019-01-14 MED ORDER — SODIUM CHLORIDE 0.9 % IV SOLN
INTRAVENOUS | Status: AC
  Filled 2019-01-14: qty 10

## 2019-01-14 MED ORDER — DEXTROSE 5 % IV SOLN
INTRAVENOUS | Status: DC | PRN
  Administered 2019-01-14: 1 g via INTRAVENOUS

## 2019-01-14 MED ORDER — SUCCINYLCHOLINE CHLORIDE 20 MG/ML IJ SOLN
INTRAMUSCULAR | Status: DC | PRN
  Administered 2019-01-14: 140 mg via INTRAVENOUS

## 2019-01-14 MED ORDER — SODIUM CHLORIDE 0.9 % IV SOLN
INTRAVENOUS | Status: DC | PRN
  Administered 2019-01-14: 17:00:00 via INTRAVENOUS

## 2019-01-14 MED ORDER — OXYCODONE HCL 5 MG/5ML PO SOLN: 5.0000 mg | Freq: Once | ORAL | Status: DC | PRN

## 2019-01-14 MED ORDER — FENTANYL CITRATE (PF) 100 MCG/2ML IJ SOLN
INTRAMUSCULAR | Status: DC | PRN
  Administered 2019-01-14: 25 ug via INTRAVENOUS

## 2019-01-14 MED ORDER — LIDOCAINE 2% (20 MG/ML) 5 ML SYRINGE
INTRAMUSCULAR | Status: DC | PRN
  Administered 2019-01-14: 60 mg via INTRAVENOUS
  Administered 2019-01-14: 40 mg via INTRAVENOUS

## 2019-01-14 MED ORDER — MIDAZOLAM HCL 2 MG/2ML IJ SOLN
INTRAMUSCULAR | Status: AC
  Filled 2019-01-14: qty 2

## 2019-01-14 MED ORDER — MIDAZOLAM HCL 2 MG/2ML IJ SOLN
INTRAMUSCULAR | Status: DC | PRN
  Administered 2019-01-14: 0.5 mg via INTRAVENOUS

## 2019-01-14 SURGICAL SUPPLY — 14 items
BAG URO CATCHER STRL LF (MISCELLANEOUS) ×3 IMPLANT
CATH INTERMIT  6FR 70CM (CATHETERS) ×3 IMPLANT
CLOTH BEACON ORANGE TIMEOUT ST (SAFETY) ×3 IMPLANT
GLOVE BIO SURGEON STRL SZ 6 (GLOVE) ×3 IMPLANT
GOWN SPEC L3 MED W/TWL (GOWN DISPOSABLE) ×3 IMPLANT
GOWN STRL REUS W/TWL LRG LVL3 (GOWN DISPOSABLE) ×6 IMPLANT
GUIDEWIRE STR DUAL SENSOR (WIRE) ×3 IMPLANT
KIT TURNOVER KIT A (KITS) IMPLANT
MANIFOLD NEPTUNE II (INSTRUMENTS) ×3 IMPLANT
PACK CYSTO (CUSTOM PROCEDURE TRAY) ×3 IMPLANT
STENT URET 6FRX24 CONTOUR (STENTS) ×2 IMPLANT
TUBING CONNECTING 10 (TUBING) ×2 IMPLANT
TUBING CONNECTING 10' (TUBING) ×1
TUBING UROLOGY SET (TUBING) IMPLANT

## 2019-01-14 NOTE — Plan of Care (Signed)

## 2019-01-14 NOTE — Progress Notes (Signed)
Nebulizer treatment completed, tol well, Lungs auscultated by Dr. Daiva Huge and are much clearer. Attemped to place on bedpan due to urge to void, unable to get pan beneath patient. Padded bed and encourage to void if needed.

## 2019-01-14 NOTE — Progress Notes (Signed)
Pt. Stated that she did not want to wear CPAP tonight, she is c/o sore throat.  RN present in room.  Will be available if she changes her mind.

## 2019-01-14 NOTE — Op Note (Signed)
PATIENT:  Karen Rasmussen  Preoperative diagnosis:  1. Left 7 mm obstructing ureteral calculus with infection  Postoperative diagnosis:  1. Same  Procedure:  1. Cystoscopy 2. left ureteral stent placement  (6 x 24)   Surgeon: Jacalyn Lefevre, M.D.  Findings: 1. Normal urethra 2. Moderate cystocele 3. Debris/clot in bladder 4. Efflux of purulent urine after wire placement; significant efflux of purulent urine after stent placement 5. Left 6 x 24 JJ ureteral stent  Anesthesia: General  Complications: None  EBL: Minimal  Specimens: None  Indication: 73 year old woman presenting to the ER with fever, chills found to have WBC of 19 and a 7 mm left proximal obstructing ureteral calculus.  Description of procedure:  The patient was taken to the operating room and general anesthesia was induced.  The patient was placed in the dorsal lithotomy position, prepped and draped in the usual sterile fashion, and preoperative antibiotics were administered. A preoperative time-out was performed.   Cystourethroscopy was performed.  The patient's urethra was examined and was normal. The bladder was then systematically examined in its entirety.  There was some debris and blood clot floating in the bladder.  Attention then turned to the left ureteral orifice and a 0.038 sensor guidewire was then advanced up the left ureter into the renal pelvis under fluoroscopic guidance. A ureteral stent was advance over the wire using Seldinger technique.  The stent was positioned appropriately under fluoroscopic and cystoscopic guidance.  The wire was then removed with an adequate stent curl noted in the renal pelvis as well as in the bladder.  The bladder was then emptied and the procedure ended.  The patient appeared to tolerate the procedure well and without complications.  The patient was able to be awakened and transferred to the recovery unit in satisfactory condition.   Disposition following procedure:  stable  Follow up: Patient will need to be scheduled for ureteroscopy with laser lithotripsy for definitive stone management approximately 3 weeks.

## 2019-01-14 NOTE — Anesthesia Procedure Notes (Signed)
Date/Time: 01/14/2019 5:52 PM Performed by: Cynda Familia, CRNA Oxygen Delivery Method: Simple face mask Placement Confirmation: positive ETCO2 and breath sounds checked- equal and bilateral Dental Injury: Teeth and Oropharynx as per pre-operative assessment

## 2019-01-14 NOTE — Interval H&P Note (Signed)
History and Physical Interval Note:  01/14/2019 4:46 PM  Karen Rasmussen  has presented today for surgery, with the diagnosis of left ureteral obstruction.  The various methods of treatment have been discussed with the patient and family. After consideration of risks, benefits and other options for treatment, the patient has consented to  Procedure(s): [place], URETEROSCOPY AND STENT PLACEMENT (Left) as a surgical intervention.  The patient's history has been reviewed, patient examined, no change in status, stable for surgery.  I have reviewed the patient's chart and labs.  Questions were answered to the patient's satisfaction.     Neelah Mannings D Marea Reasner

## 2019-01-14 NOTE — Consult Note (Signed)
I have been asked to see the patient by Dr. Alfredia Ferguson, for evaluation and management of left obstructing ureteral calculus with fever.  History of present illness: 73 year old woman presented to the ER with generalized weakness.  She was found to have WBC 19 and imaging showed a 7 mm left proximal ureteral calculus.  She was febrile.  Patient also had poorly controlled blood sugar.  Review of systems: A 12 point comprehensive review of systems was obtained and is negative unless otherwise stated in the history of present illness.  Patient Active Problem List   Diagnosis Date Noted  . Hyperosmolar hyperglycemic state (HHS) ([place]) 01/13/2019  . Acute renal injury ([place])   . Urinary tract infection without hematuria   . Elevated troponin   . Person under investigation for COVID-19   . Preoperative clearance 11/02/2017  . Hypokalemia 09/23/2016  . Encounter for monitoring sotalol therapy 09/21/2016  . DCM (dilated cardiomyopathy) ([place])   . Benign essential HTN 04/26/2016  . Persistent atrial fibrillation ([place]) 04/26/2016  . Morbid obesity with BMI of 40.0-44.9, adult ([place])   . Chronic diastolic CHF (congestive heart failure) ([place])   . Abdominal pain, left lower quadrant   . OSA (obstructive sleep apnea)   . Benign hypertensive heart disease without heart failure   . GERD (gastroesophageal reflux disease)   . Hypersomnia   . Depression   . Asthmatic bronchitis   . Bursitis of hip     No current facility-administered medications on file prior to encounter.    Current Outpatient Medications on File Prior to Encounter  Medication Sig Dispense Refill  . acetaminophen (TYLENOL) 650 MG CR tablet Take 650 mg by mouth 2 (two) times daily as needed for pain.    Marland Kitchen alendronate (FOSAMAX) 70 MG tablet Take 70 mg by mouth every Sunday. Take with a full glass of water on an empty stomach.     . Biotin w/ Vitamins C & E (HAIR SKIN & NAILS GUMMIES PO) Take 2 tablets by mouth daily.     Marland Kitchen CALCIUM PO Take  650 mg by mouth daily.    . carvedilol (COREG) 3.125 MG tablet Take 1 tablet (3.125 mg total) by mouth 2 (two) times daily with a meal. 180 tablet 3  . Cholecalciferol (VITAMIN D3) 5000 units CAPS Take 5,000 Units by mouth daily.    . diphenhydrAMINE-zinc acetate (BENADRYL) cream Apply 1 application topically 3 (three) times daily as needed for itching.    Marland Kitchen glimepiride (AMARYL) 4 MG tablet Take 8 mg by mouth daily with breakfast.     . HYDROcodone-acetaminophen (NORCO/VICODIN) 5-325 MG tablet Take 1 tablet by mouth every 8 (eight) hours as needed for moderate pain.     . metFORMIN (GLUCOPHAGE) 500 MG tablet Take 500 mg by mouth 2 (two) times daily with a meal.     . Misc Natural Products (GLUCOSAMINE CHONDROITIN TRIPLE) TABS Take 2 tablets by mouth daily.     . Multiple Vitamins-Minerals (CENTRUM SILVER) CHEW Chew 2 tablets by mouth daily. soft chews    . pantoprazole (PROTONIX) 40 MG tablet Take 40 mg by mouth daily.    . potassium chloride (K-DUR,KLOR-CON) 10 MEQ tablet TAKE 2 TABLETS BY MOUTH TWICE DAILY (Patient taking differently: Take 20 mEq by mouth 2 (two) times daily. ) 360 tablet 3  . psyllium (METAMUCIL SMOOTH TEXTURE) 28 % packet Take 1 packet by mouth 2 (two) times daily.     . rivaroxaban (XARELTO) 20 MG TABS tablet Take 1 tablet (20  mg total) by mouth every evening. 90 tablet 3  . simvastatin (ZOCOR) 10 MG tablet Take 1 tablet (10 mg total) by mouth at bedtime. 90 tablet 3  . sotalol (BETAPACE) 80 MG tablet TAKE 1 TABLET(80 MG) BY MOUTH EVERY 12 HOURS (Patient taking differently: Take 80 mg by mouth 2 (two) times daily. ) 245 tablet 3  . spironolactone (ALDACTONE) 25 MG tablet TAKE 1/2 TABLET (12.5MG     TOTAL) DAILY (Patient taking differently: Take 12.5 mg by mouth daily. ) 45 tablet 3  . tamsulosin (FLOMAX) 0.4 MG CAPS capsule Take 0.4 mg by mouth daily.    Marland Kitchen triamcinolone cream (KENALOG) 0.1 % Apply 1 application topically 2 (two) times daily as needed (itching legs).        Past Medical History:  Diagnosis Date  . Abdominal pain, left lower quadrant   . Asthmatic bronchitis   . Benign essential HTN 04/26/2016  . Benign hypertensive heart disease without heart failure   . Bursitis of hip   . Chronic diastolic CHF (congestive heart failure) ([place])   . DCM (dilated cardiomyopathy) ([place])    EF 45-50%  . GERD (gastroesophageal reflux disease)   . Heart murmur   . High cholesterol   . History of kidney stones   . Hypersomnia   . Morbid obesity with BMI of 50.0-59.9, adult ([place])   . OSA on CPAP   . Osteoarthritis    "right hip; both knees" (09/21/2016)  . Persistent atrial fibrillation ([place]) 04/26/2016  . Situational depression 2003   "when I was going thru my divorce"  . Type II diabetes mellitus ([place])   . Vitamin D deficiency disease     Past Surgical History:  Procedure Laterality Date  . ABDOMINAL HYSTERECTOMY    . APPENDECTOMY    . BALLOON DILATION N/A 11/17/2017   Procedure: BALLOON DILATION;  Surgeon: Ronnette Juniper, MD;  Location: [facility];  Service: Gastroenterology;  Laterality: N/A;  . BIOPSY  11/17/2017   Procedure: BIOPSY;  Surgeon: Ronnette Juniper, MD;  Location: [facility] ENDOSCOPY;  Service: Gastroenterology;;  . CARDIAC CATHETERIZATION    . CARDIOVERSION N/A 06/11/2016   Procedure: CARDIOVERSION;  Surgeon: Dorothy Spark, MD;  Location: [facility] ENDOSCOPY;  Service: Cardiovascular;  Laterality: N/A;  . CARDIOVERSION N/A 07/16/2016   Procedure: CARDIOVERSION;  Surgeon: Thayer Headings, MD;  Location: [facility] ENDOSCOPY;  Service: Cardiovascular;  Laterality: N/A;  . CARDIOVERSION N/A 09/23/2016   Procedure: CARDIOVERSION;  Surgeon: Sanda Klein, MD;  Location: [facility] ENDOSCOPY;  Service: Cardiovascular;  Laterality: N/A;  . CHOLECYSTECTOMY OPEN    . COLONOSCOPY N/A 11/17/2017   Procedure: COLONOSCOPY;  Surgeon: Ronnette Juniper, MD;  Location: [facility] ENDOSCOPY;  Service: Gastroenterology;  Laterality: N/A;  . costisone injection to left knee  11/03/2017  .  DILATION AND CURETTAGE OF UTERUS     S/P miscarriage  . ESOPHAGOGASTRODUODENOSCOPY N/A 11/17/2017   Procedure: ESOPHAGOGASTRODUODENOSCOPY (EGD);  Surgeon: Ronnette Juniper, MD;  Location: [facility] ENDOS[facility];  Service: Gastroenterology;  Laterality: N/A;  . NASAL SEPTUM SURGERY    . POLYPECTOMY  11/17/2017   Procedure: POLYPECTOMY;  Surgeon: Ronnette Juniper, MD;  Location: [facility] ENDOS[facility];  Service: Gastroenterology;;  . TONSILLECTOMY      Social History   Tobacco Use  . Smoking status: Never Smoker  . Smokeless tobacco: Never Used  Substance Use Topics  . Alcohol use: Yes    Comment: 09/21/2016 "might have a glass of wine once/year; if that"  . Drug use: No    Family History  Problem Relation Age of Onset  . Cancer Father   . Heart failure Father   . Diabetes Father   . CAD Mother   . Diabetes Brother   . Hypertension Brother   . Breast cancer Neg Hx     PE: Vitals:   01/13/19 2032 01/13/19 2347 01/14/19 0446 01/14/19 0741  BP: 118/65 128/67 131/62 124/61  Pulse: 68 77 71 66  Resp: 20  18   Temp: 98.1 F (36.7 C)  98.4 F (36.9 C)   TempSrc: Oral  Oral   SpO2: 92%  94% 94%  Weight:      Height:       Patient appears to be in no acute distress  patient is alert and oriented x3 Atraumatic normocephalic head No cervical or supraclavicular lymphadenopathy appreciated No increased work of breathing, no audible wheezes/rhonchi Regular sinus rhythm/rate Abdomen is soft, nontender, nondistended, no CVA or suprapubic tenderness Lower extremities are symmetric without appreciable edema Grossly neurologically intact No identifiable skin lesions  Recent Labs    01/12/19 2259 01/13/19 0542 01/14/19 0537  WBC 14.6* 19.4* 12.8*  HGB 11.8* 10.9* 10.4*  HCT 37.3 34.5* 34.4*   Recent Labs    01/12/19 2259 01/13/19 0542 01/14/19 0537  NA 131* 133* 136  K 4.6 4.9 4.5  CL 101 102 105  CO2 19* 19* 20*  GLUCOSE 465* 451* 203*  BUN 32* 30* 21  CREATININE 1.81* 1.49* 1.43*  CALCIUM  8.2* 8.0* 8.2*   No results for input(s): LABPT, INR in the last 72 hours. No results for input(s): LABURIN in the last 72 hours. Results for orders placed or performed during the hospital encounter of 01/12/19  SARS CORONAVIRUS 2 (TAT 6-24 HRS) Nasopharyngeal Nasopharyngeal Swab     Status: None   Collection Time: 01/13/19 12:24 AM   Specimen: Nasopharyngeal Swab  Result Value Ref Range Status   SARS Coronavirus 2 NEGATIVE NEGATIVE Final    Comment: (NOTE) SARS-CoV-2 target nucleic acids are NOT DETECTED. The SARS-CoV-2 RNA is generally detectable in upper and lower respiratory specimens during the acute phase of infection. Negative results do not preclude SARS-CoV-2 infection, do not rule out co-infections with other pathogens, and should not be used as the sole basis for treatment or other patient management decisions. Negative results must be combined with clinical observations, patient history, and epidemiological information. The expected result is Negative. Fact Sheet for Patients: SugarRoll.be Fact Sheet for Healthcare Providers: https://www.woods-mathews.com/ This test is not yet approved or cleared by the [place] FDA and  has been authorized for detection and/or diagnosis of SARS-CoV-2 by FDA under an Emergency Use Authorization (EUA). This EUA will remain  in effect (meaning this test can be used) for the duration of the COVID-19 declaration under Section 56 4(b)(1) of the Act, 21 U.S.C. section 360bbb-3(b)(1), unless the authorization is terminated or revoked sooner. Performed at [facility], [place] [address], [place], [place] [address]   Blood culture (routine x 2)     Status: None (Preliminary result)   Collection Time: 01/13/19 12:24 AM   Specimen: BLOOD  Result Value Ref Range Status   Specimen Description   Final    BLOOD RIGHT ANTECUBITAL Performed at [facility] [address],  [place], [place] [address]    Special Requests   Final    BOTTLES DRAWN AEROBIC AND ANAEROBIC Blood Culture adequate volume Performed at [facility] [address] [place], [place] [address]  Culture   Final    NO GROWTH 1 DAY Performed at [facility], [place] [address], [place], [place] [address]    Report Status PENDING  Incomplete  Blood culture (routine x 2)     Status: None (Preliminary result)   Collection Time: 01/13/19  2:23 AM   Specimen: BLOOD  Result Value Ref Range Status   Specimen Description   Final    BLOOD LEFT ANTECUBITAL Performed at [facility] [address] [place], [place] [address]    Special Requests   Final    BOTTLES DRAWN AEROBIC AND ANAEROBIC Blood Culture adequate volume Performed at [facility] [address] [place], [place] [address]    Culture   Final    NO GROWTH < 24 HOURS Performed at [facility] [address], [place], [place] [address]    Report Status PENDING  Incomplete    Imaging: CT Abd/Pelvis 01/13/19 IMPRESSION: 1. Obstructing calculus at the LEFT ureteropelvic junction. Mild LEFT hydronephrosis and renal edema. 2. LEFT nephrolithiasis. 3. LEFT colon diverticulosis without evidence diverticulitis.  Imp: 73 year old poorly controlled diabetic woman with a 7 mm obstructing left ureteral calculus in the setting of suspected infection with fever, elevated WBC 19 and WBCs in urine.  Recommendations: -Risks and benefits of cystoscopy with left ureteral stent placement including but not limited to pain, bleeding, inability to remove stone, need for staged procedure, need for additional procedures, damage to surrounding structures, stent discomfort discussed with patient and informed consent obtained -to OR for cystoscopy with left ureteral stent placement today -Patient will need staged ureteroscopy laser lithotripsy for definitive stone management to be scheduled  as an outpatient  Thank you for involving me in this patient's care, I will continue to follow along.Please page with any further questions or concerns. Aalaiyah Yassin D Frantz Quattrone

## 2019-01-14 NOTE — Transfer of Care (Signed)
Immediate Anesthesia Transfer of Care Note  Patient: Karen Rasmussen  Procedure(s) Performed: CYSTOSCOPy  STENT PLACEMENT (Left Ureter)  Patient Location: PACU  Anesthesia Type:General  Level of Consciousness: awake and alert   Airway & Oxygen Therapy: Patient Spontanous Breathing and Patient connected to face mask oxygen  Post-op Assessment: Report given to RN and Post -op Vital signs reviewed and stable  Post vital signs: Reviewed and stable  Last Vitals:  Vitals Value Taken Time  BP 115/62 01/14/19 1815  Temp    Pulse 70 01/14/19 1815  Resp 22 01/14/19 1815  SpO2 94 % 01/14/19 1815  Vitals shown include unvalidated device data.  Last Pain:  Vitals:   01/14/19 1803  TempSrc:   PainSc: (P) 0-No pain      Patients Stated Pain Goal: 3 ([id number])  Complications: No apparent anesthesia complications

## 2019-01-14 NOTE — Anesthesia Preprocedure Evaluation (Addendum)
Anesthesia Evaluation  Patient identified by MRN, date of birth, ID band Patient awake    Reviewed: Allergy & Precautions, Patient's Chart, lab work & pertinent test results, reviewed documented beta blocker date and time   History of Anesthesia Complications Negative for: history of anesthetic complications  Airway Mallampati: III  TM Distance: >3 FB Neck ROM: Full    Dental no notable dental hx.    Pulmonary asthma , sleep apnea and Continuous Positive Airway Pressure Ventilation ,    Pulmonary exam normal        Cardiovascular hypertension, Pt. on medications and Pt. on home beta blockers +CHF  Normal cardiovascular exam+ dysrhythmias Atrial Fibrillation   TTE 2018: moderate LVH, EF 45-50%, diffuse hypokinesis    Neuro/Psych Depression negative neurological ROS     GI/Hepatic Neg liver ROS, GERD  Controlled and Medicated,  Endo/Other  diabetes, Type 2, Oral Hypoglycemic AgentsMorbid obesity  Renal/GU ARFRenal disease (Cr 1.43)  negative genitourinary   Musculoskeletal  (+) Arthritis ,   Abdominal   Peds  Hematology  (+) anemia , Hgb 10.4   Anesthesia Other Findings Day of surgery medications reviewed with patient.  Reproductive/Obstetrics negative OB ROS                            Anesthesia Physical Anesthesia Plan  ASA: III and emergent  Anesthesia Plan: General   Post-op Pain Management:    Induction: Intravenous, Rapid sequence and Cricoid pressure planned  PONV Risk Score and Plan: 3 and Treatment may vary due to age or medical condition, Ondansetron, Dexamethasone and Midazolam  Airway Management Planned: Oral ETT  Additional Equipment: None  Intra-op Plan:   Post-operative Plan: Extubation in OR  Informed Consent: I have reviewed the patients History and Physical, chart, labs and discussed the procedure including the risks, benefits and alternatives for the proposed  anesthesia with the patient or authorized representative who has indicated his/her understanding and acceptance.     Dental advisory given  Plan Discussed with: CRNA  Anesthesia Plan Comments: (Ate breakfast at 09:45 this morning. )       Anesthesia Quick Evaluation

## 2019-01-14 NOTE — H&P (View-Only) (Signed)
I have been asked to see the patient by Dr. Alfredia Ferguson, for evaluation and management of left obstructing ureteral calculus with fever.  History of present illness: 73 year old woman presented to the ER with generalized weakness.  She was found to have WBC 19 and imaging showed a 7 mm left proximal ureteral calculus.  She was febrile.  Patient also had poorly controlled blood sugar.  Review of systems: A 12 point comprehensive review of systems was obtained and is negative unless otherwise stated in the history of present illness.  Patient Active Problem List   Diagnosis Date Noted  . Hyperosmolar hyperglycemic state (HHS) ([place]) 01/13/2019  . Acute renal injury ([place])   . Urinary tract infection without hematuria   . Elevated troponin   . Person under investigation for COVID-19   . Preoperative clearance 11/02/2017  . Hypokalemia 09/23/2016  . Encounter for monitoring sotalol therapy 09/21/2016  . DCM (dilated cardiomyopathy) ([place])   . Benign essential HTN 04/26/2016  . Persistent atrial fibrillation ([place]) 04/26/2016  . Morbid obesity with BMI of 40.0-44.9, adult ([place])   . Chronic diastolic CHF (congestive heart failure) ([place])   . Abdominal pain, left lower quadrant   . OSA (obstructive sleep apnea)   . Benign hypertensive heart disease without heart failure   . GERD (gastroesophageal reflux disease)   . Hypersomnia   . Depression   . Asthmatic bronchitis   . Bursitis of hip     No current facility-administered medications on file prior to encounter.    Current Outpatient Medications on File Prior to Encounter  Medication Sig Dispense Refill  . acetaminophen (TYLENOL) 650 MG CR tablet Take 650 mg by mouth 2 (two) times daily as needed for pain.    Marland Kitchen alendronate (FOSAMAX) 70 MG tablet Take 70 mg by mouth every Sunday. Take with a full glass of water on an empty stomach.     . Biotin w/ Vitamins C & E (HAIR SKIN & NAILS GUMMIES PO) Take 2 tablets by mouth daily.     Marland Kitchen CALCIUM PO Take  650 mg by mouth daily.    . carvedilol (COREG) 3.125 MG tablet Take 1 tablet (3.125 mg total) by mouth 2 (two) times daily with a meal. 180 tablet 3  . Cholecalciferol (VITAMIN D3) 5000 units CAPS Take 5,000 Units by mouth daily.    . diphenhydrAMINE-zinc acetate (BENADRYL) cream Apply 1 application topically 3 (three) times daily as needed for itching.    Marland Kitchen glimepiride (AMARYL) 4 MG tablet Take 8 mg by mouth daily with breakfast.     . HYDROcodone-acetaminophen (NORCO/VICODIN) 5-325 MG tablet Take 1 tablet by mouth every 8 (eight) hours as needed for moderate pain.     . metFORMIN (GLUCOPHAGE) 500 MG tablet Take 500 mg by mouth 2 (two) times daily with a meal.     . Misc Natural Products (GLUCOSAMINE CHONDROITIN TRIPLE) TABS Take 2 tablets by mouth daily.     . Multiple Vitamins-Minerals (CENTRUM SILVER) CHEW Chew 2 tablets by mouth daily. soft chews    . pantoprazole (PROTONIX) 40 MG tablet Take 40 mg by mouth daily.    . potassium chloride (K-DUR,KLOR-CON) 10 MEQ tablet TAKE 2 TABLETS BY MOUTH TWICE DAILY (Patient taking differently: Take 20 mEq by mouth 2 (two) times daily. ) 360 tablet 3  . psyllium (METAMUCIL SMOOTH TEXTURE) 28 % packet Take 1 packet by mouth 2 (two) times daily.     . rivaroxaban (XARELTO) 20 MG TABS tablet Take 1 tablet (20  mg total) by mouth every evening. 90 tablet 3  . simvastatin (ZOCOR) 10 MG tablet Take 1 tablet (10 mg total) by mouth at bedtime. 90 tablet 3  . sotalol (BETAPACE) 80 MG tablet TAKE 1 TABLET(80 MG) BY MOUTH EVERY 12 HOURS (Patient taking differently: Take 80 mg by mouth 2 (two) times daily. ) 245 tablet 3  . spironolactone (ALDACTONE) 25 MG tablet TAKE 1/2 TABLET (12.5MG     TOTAL) DAILY (Patient taking differently: Take 12.5 mg by mouth daily. ) 45 tablet 3  . tamsulosin (FLOMAX) 0.4 MG CAPS capsule Take 0.4 mg by mouth daily.    Marland Kitchen triamcinolone cream (KENALOG) 0.1 % Apply 1 application topically 2 (two) times daily as needed (itching legs).        Past Medical History:  Diagnosis Date  . Abdominal pain, left lower quadrant   . Asthmatic bronchitis   . Benign essential HTN 04/26/2016  . Benign hypertensive heart disease without heart failure   . Bursitis of hip   . Chronic diastolic CHF (congestive heart failure) ([place])   . DCM (dilated cardiomyopathy) ([place])    EF 45-50%  . GERD (gastroesophageal reflux disease)   . Heart murmur   . High cholesterol   . History of kidney stones   . Hypersomnia   . Morbid obesity with BMI of 50.0-59.9, adult ([place])   . OSA on CPAP   . Osteoarthritis    "right hip; both knees" (09/21/2016)  . Persistent atrial fibrillation ([place]) 04/26/2016  . Situational depression 2003   "when I was going thru my divorce"  . Type II diabetes mellitus ([place])   . Vitamin D deficiency disease     Past Surgical History:  Procedure Laterality Date  . ABDOMINAL HYSTERECTOMY    . APPENDECTOMY    . BALLOON DILATION N/A 11/17/2017   Procedure: BALLOON DILATION;  Surgeon: Ronnette Juniper, MD;  Location: [facility] END[facility];  Service: Gastroenterology;  Laterality: N/A;  . BIOPSY  11/17/2017   Procedure: BIOPSY;  Surgeon: Ronnette Juniper, MD;  Location: [facility] ENDOSCOPY;  Service: Gastroenterology;;  . CARDIAC CATHETERIZATION    . CARDIOVERSION N/A 06/11/2016   Procedure: CARDIOVERSION;  Surgeon: Dorothy Spark, MD;  Location: [facility] ENDOSCOPY;  Service: Cardiovascular;  Laterality: N/A;  . CARDIOVERSION N/A 07/16/2016   Procedure: CARDIOVERSION;  Surgeon: Thayer Headings, MD;  Location: [facility] ENDOSCOPY;  Service: Cardiovascular;  Laterality: N/A;  . CARDIOVERSION N/A 09/23/2016   Procedure: CARDIOVERSION;  Surgeon: Sanda Klein, MD;  Location: [facility] ENDOSCOPY;  Service: Cardiovascular;  Laterality: N/A;  . CHOLECYSTECTOMY OPEN    . COLONOSCOPY N/A 11/17/2017   Procedure: COLONOSCOPY;  Surgeon: Ronnette Juniper, MD;  Location: [facility] ENDOSCOPY;  Service: Gastroenterology;  Laterality: N/A;  . costisone injection to left knee  11/03/2017  .  DILATION AND CURETTAGE OF UTERUS     S/P miscarriage  . ESOPHAGOGASTRODUODENOSCOPY N/A 11/17/2017   Procedure: ESOPHAGOGASTRODUODENOSCOPY (EGD);  Surgeon: Ronnette Juniper, MD;  Location: [facility] ENDOS[facility];  Service: Gastroenterology;  Laterality: N/A;  . NASAL SEPTUM SURGERY    . POLYPECTOMY  11/17/2017   Procedure: POLYPECTOMY;  Surgeon: Ronnette Juniper, MD;  Location: [facility] ENDOS[facility];  Service: Gastroenterology;;  . TONSILLECTOMY      Social History   Tobacco Use  . Smoking status: Never Smoker  . Smokeless tobacco: Never Used  Substance Use Topics  . Alcohol use: Yes    Comment: 09/21/2016 "might have a glass of wine once/year; if that"  . Drug use: No    Family History  Problem Relation Age of Onset  . Cancer Father   . Heart failure Father   . Diabetes Father   . CAD Mother   . Diabetes Brother   . Hypertension Brother   . Breast cancer Neg Hx     PE: Vitals:   01/13/19 2032 01/13/19 2347 01/14/19 0446 01/14/19 0741  BP: 118/65 128/67 131/62 124/61  Pulse: 68 77 71 66  Resp: 20  18   Temp: 98.1 F (36.7 C)  98.4 F (36.9 C)   TempSrc: Oral  Oral   SpO2: 92%  94% 94%  Weight:      Height:       Patient appears to be in no acute distress  patient is alert and oriented x3 Atraumatic normocephalic head No cervical or supraclavicular lymphadenopathy appreciated No increased work of breathing, no audible wheezes/rhonchi Regular sinus rhythm/rate Abdomen is soft, nontender, nondistended, no CVA or suprapubic tenderness Lower extremities are symmetric without appreciable edema Grossly neurologically intact No identifiable skin lesions  Recent Labs    01/12/19 2259 01/13/19 0542 01/14/19 0537  WBC 14.6* 19.4* 12.8*  HGB 11.8* 10.9* 10.4*  HCT 37.3 34.5* 34.4*   Recent Labs    01/12/19 2259 01/13/19 0542 01/14/19 0537  NA 131* 133* 136  K 4.6 4.9 4.5  CL 101 102 105  CO2 19* 19* 20*  GLUCOSE 465* 451* 203*  BUN 32* 30* 21  CREATININE 1.81* 1.49* 1.43*  CALCIUM  8.2* 8.0* 8.2*   No results for input(s): LABPT, INR in the last 72 hours. No results for input(s): LABURIN in the last 72 hours. Results for orders placed or performed during the hospital encounter of 01/12/19  SARS CORONAVIRUS 2 (TAT 6-24 HRS) Nasopharyngeal Nasopharyngeal Swab     Status: None   Collection Time: 01/13/19 12:24 AM   Specimen: Nasopharyngeal Swab  Result Value Ref Range Status   SARS Coronavirus 2 NEGATIVE NEGATIVE Final    Comment: (NOTE) SARS-CoV-2 target nucleic acids are NOT DETECTED. The SARS-CoV-2 RNA is generally detectable in upper and lower respiratory specimens during the acute phase of infection. Negative results do not preclude SARS-CoV-2 infection, do not rule out co-infections with other pathogens, and should not be used as the sole basis for treatment or other patient management decisions. Negative results must be combined with clinical observations, patient history, and epidemiological information. The expected result is Negative. Fact Sheet for Patients: SugarRoll.be Fact Sheet for Healthcare Providers: https://www.woods-mathews.com/ This test is not yet approved or cleared by the [place] FDA and  has been authorized for detection and/or diagnosis of SARS-CoV-2 by FDA under an Emergency Use Authorization (EUA). This EUA will remain  in effect (meaning this test can be used) for the duration of the COVID-19 declaration under Section 56 4(b)(1) of the Act, 21 U.S.C. section 360bbb-3(b)(1), unless the authorization is terminated or revoked sooner. Performed at [facility], [place] [address], [place], [place] [address]   Blood culture (routine x 2)     Status: None (Preliminary result)   Collection Time: 01/13/19 12:24 AM   Specimen: BLOOD  Result Value Ref Range Status   Specimen Description   Final    BLOOD RIGHT ANTECUBITAL Performed at [facility] [address],  [place], [place] [address]    Special Requests   Final    BOTTLES DRAWN AEROBIC AND ANAEROBIC Blood Culture adequate volume Performed at [facility] [address] [place],  [address]  Culture   Final    NO GROWTH 1 DAY Performed at [facility], [place] [address], [place], [place] [address]    Report Status PENDING  Incomplete  Blood culture (routine x 2)     Status: None (Preliminary result)   Collection Time: 01/13/19  2:23 AM   Specimen: BLOOD  Result Value Ref Range Status   Specimen Description   Final    BLOOD LEFT ANTECUBITAL Performed at [facility] [address], [place], [place] [address]    Special Requests   Final    BOTTLES DRAWN AEROBIC AND ANAEROBIC Blood Culture adequate volume Performed at [facility] [address] [place], [place] [address]    Culture   Final    NO GROWTH < 24 HOURS Performed at [facility] [address], [place], [place] [address]    Report Status PENDING  Incomplete    Imaging: CT Abd/Pelvis 01/13/19 IMPRESSION: 1. Obstructing calculus at the LEFT ureteropelvic junction. Mild LEFT hydronephrosis and renal edema. 2. LEFT nephrolithiasis. 3. LEFT colon diverticulosis without evidence diverticulitis.  Imp: 73 year old poorly controlled diabetic woman with a 7 mm obstructing left ureteral calculus in the setting of suspected infection with fever, elevated WBC 19 and WBCs in urine.  Recommendations: -Risks and benefits of cystoscopy with left ureteral stent placement including but not limited to pain, bleeding, inability to remove stone, need for staged procedure, need for additional procedures, damage to surrounding structures, stent discomfort discussed with patient and informed consent obtained -to OR for cystoscopy with left ureteral stent placement today -Patient will need staged ureteroscopy laser lithotripsy for definitive stone management to be scheduled  as an outpatient  Thank you for involving me in this patient's care, I will continue to follow along.Please page with any further questions or concerns. Leoni Goodness D Numa Schroeter

## 2019-01-14 NOTE — Progress Notes (Signed)
Per RN, pt did not want cpap at midnight and now has refused it for tonight.  RT will follow-up with pt later tonight and assist with cpap as needed.

## 2019-01-14 NOTE — Anesthesia Procedure Notes (Signed)
Procedure Name: Intubation Date/Time: 01/14/2019 5:31 PM Performed by: Cynda Familia, CRNA Pre-anesthesia Checklist: Patient identified, Emergency Drugs available, Suction available and Patient being monitored Patient Re-evaluated:Patient Re-evaluated prior to induction Oxygen Delivery Method: Circle System Utilized Preoxygenation: Pre-oxygenation with 100% oxygen Induction Type: IV induction Ventilation: Mask ventilation without difficulty Laryngoscope Size: Glidescope Grade View: Grade I Tube type: Oral Tube size: 7.0 mm Number of attempts: 1 Airway Equipment and Method: Stylet and Oral airway Placement Confirmation: ETT inserted through vocal cords under direct vision,  positive ETCO2 and breath sounds checked- equal and bilateral Secured at: 22 cm Tube secured with: Tape Dental Injury: Teeth and Oropharynx as per pre-operative assessment  Difficulty Due To: Difficulty was anticipated, Difficult Airway- due to reduced neck mobility, Difficult Airway- due to anterior larynx and Difficult Airway- due to limited oral opening Future Recommendations: Recommend- induction with short-acting agent, and alternative techniques readily available Comments: Smooth RSI Howze-- intubation AM CRNA-- Glidescope-- atraumatic-- difficulty anticipated-- short chin-- no neck limited mouth and neck mobility-- bilat BS Howze

## 2019-01-14 NOTE — Progress Notes (Signed)
PROGRESS NOTE    Karen Rasmussen  [id number] DOB: 10-21-1945 DOA: 01/12/2019 PCP: Kathyrn Lass, MD   Brief Narrative:  Patient is a morbidly obese Caucasain female with past medical history significant for but not limited to benign essential hypertension, chronic diastolic CHF and dilated cardiomyopathy, GERD, hyperlipidemia, history of nephrolithiasis, morbid obesity, OSA on CPAP, persistent atrial fibrillation, type 2 diabetes mellitus, vitamin D deficiency and other comorbidities who presents with a chief complaint of weakness which is global, dry cough, night sweats and increased sugars.  She reports that she is also had diarrhea recently. She states that symptoms have been going on for a little while now for at least 1 to 2 weeks and she has been more fatigued.   Of note she also had some flank pain and was worked up and found to have urinary tract infection.  A CT scan of the abdomen pelvis was done which showed nonobstructing left ureteral calculus.  Urology was consulted and took the patient to the OR for stent placement and definitive laser lithotripsy eventually in the outpatient setting.  Currently she is on antibiotics with IV ceftriaxone which we will continue  Assessment & Plan:   Active Problems:   Abdominal pain, left lower quadrant   OSA (obstructive sleep apnea)   GERD (gastroesophageal reflux disease)   Morbid obesity with BMI of 40.0-44.9, adult (HCC)   Chronic diastolic CHF (congestive heart failure) (HCC)   Benign essential HTN   Persistent atrial fibrillation (HCC)   DCM (dilated cardiomyopathy) (HCC)   Hypokalemia   Hyperosmolar hyperglycemic state (HHS) (HCC)   Acute renal injury ([place])   Urinary tract infection without hematuria   Elevated troponin   Left nephrolithiasis  UTI in the setting of obstructing Left Ureteral Nephrolithiasis -WBC went from 14.6 -> 19.4 -> 12.8 -Urinalysis showed a hazy appearance with greater than 500 glucose, large hemoglobin, 5  ketones, large leukocytes, no bacteria seen, greater than 50 RBCs per high-power field, and greater than 50 WBCs Patient is complaining of low back pain more so on the left side compared to the right -Blood cultures x2 were obtained however unfortunately urine cultures not obtained until after she was started on antibiotics -Received IV ceftriaxone which we will continue for now -Reduce fluid rate to 75 mL's per hour to 12 more hours given her history of CHF -Check CT of the abdomen pelvis without contrast and showed an obstructing calculus at the left uteropelvic junction and mild left hydronephrosis and renal edema along with left nephrolithiasis and left colon diverticulosis without evidence of diverticulitis -Urine culture showed 10,000 colonies of E. coli -Urology was consulted for further evaluation recommendations and Dr. Claudia Desanctis recommended cystoscopy with left ureteral stent placement with eventual staged ureteroscopic laser lithotripsy for definitive stone management -Continue with IV antibiotics and further care per Urology; I spoke with urology in the patient currently does not need a Foley catheter  Persistent Atrial Fibrillation currently in NSR -Continue with carvedilol and sotalol -Continue with Rivaroxaban -She received subcu Lovenox last night -Cardiology was consulted for further evaluation recommendations  Uncontrolled Diabetes Mellitus Type 2  -Hemoglobin A1c was 8.7 -Takes Metformin and glimepiride at home which have both been held  -In the setting of infection -Patient blood sugar on admission was 465 and repeat was 451 and CBGs ranging from 339-424; Now CBG's ranging from 186-238 -Continue with moderate NovoLog/scale insulin and was given insulin aspart 6 units 3 times daily with meals and also given Lantus 10 units subcu  nightly For diabetes education coordinator consulted for further evaluation recommendations  Dilated Cardiomyopathy and history of Diastolic CHF with  an EF of 45 to 50% -Continue with carvedilol and hold spironolactone for now -Strict I's and O's and daily weights  -BNP was 242.1 on admission and troponin was initially elevated at 48 and trending down to 33 -Cardiology was consulted for further evaluation recommendations they suspect that her troponin elevation in the context of her infection \ -They recommend no further cardiac work-up is indicated and recommending diuresing gently however given her dehydration we will continue fluids for today and resume her spironolactone in the a.m. -Continue to monitor for signs symptoms of volume overload -Repeat chest x-ray in the a.m.  AKI on CKD Stage 3b Metabolic Acidosis -In the setting of Infection and Dehydration -Patient's BUN/creatinine went from 32/1.81 and is now 20/1.43 -Hold Spironolactone for now -Patient's CO2 is 19, chloride level 102, and anion gap was 12; Now patient's CO2 is 20, anion gap is 11, and chloride levels 105 -Given IVF with NS but reduced rate to 75 mL/hr and will stop after 12 hours -Avoid nephrotoxic medications, contrast dyes, hypotension if possible and renally adjust medications  -Continue monitor trend renal function n -Repeat CMP in the a.m.  Generalized Weakness likely in the setting of infection rule out other etiologies -Get PT/OT evaluation -Of note SARS-CoV-2 testing was negative -Gentle IVF Hydration with normal saline at a rate of 75 mL's per hour for another 12 more hours and stopped now -Continue to treat infection and follow clinical response  HLD -C/w SimvaStatin 10 mg po qHS  Hyponatremia -Mild on admission and trending upwards  -Patient's sodium went from 131 and is now improved to 136 -Continue with gentle IV fluid hydration with normal saline at 75 mL's per hour for another 12 hours and now IVF stopped -Continue to monitor and trend and repeat CMP in a.m.  OSA -Will order CPAP  Morbid Obesity -Estimated body mass index is 49.75  kg/m as calculated from the following:   Height as of this encounter: 5\' 2"  (1.575 m).   Weight as of this encounter: 123.4 kg. -Weight Loss and Dietary Counseling given   Hypophosphatemia -Patient's phosphorus level was 2.4  -Replete with p.o K Phos Neutral 500 mg x1  -Continue to Monitor and Replete as Necessary -Repeat Phos Level in AM .  DVT prophylaxis: Anticoagulated with Xarelto  Code Status: FULL CODE  Family Communication: No family present at bedside  Disposition Plan: Pending further workup and treatment  Consultants:   Cardiology  Urology   Procedures:  Cystoscopy    Antimicrobials:  Anti-infectives (From admission, onward)   Start     Dose/Rate Route Frequency Ordered Stop   01/14/19 1655  sodium chloride 0.9 % with cefTRIAXone (ROCEPHIN) ADS Med    Note to Pharmacy: Octavio Graves   : cabinet override      01/14/19 1655 01/15/19 0459   01/13/19 2200  [MAR Hold]  cefTRIAXone (ROCEPHIN) 1 g in sodium chloride 0.9 % 100 mL IVPB     (MAR Hold since Sun 01/14/2019 at 1717.Hold Reason: Transfer to a Procedural area.)   1 g 200 mL/hr over 30 Minutes Intravenous Daily at bedtime 01/13/19 0532     01/13/19 0230  cefTRIAXone (ROCEPHIN) 1 g in sodium chloride 0.9 % 100 mL IVPB     1 g 200 mL/hr over 30 Minutes Intravenous  Once 01/13/19 0223 01/13/19 0537     Subjective: Seen and examined  at bedside and she was still complaining of some left-sided back pain and flank pain.  No nausea or vomiting but still feels tired and fatigued.  Happy that her blood sugars are improving.  Has some shakes this morning and tremors.  No other concerns or points at this time.  Objective: Vitals:   01/14/19 0446 01/14/19 0741 01/14/19 1241 01/14/19 1803  BP: 131/62 124/61 140/66 (P) 117/63  Pulse: 71 66 73 72  Resp: 18  18 (!) 22  Temp: 98.4 F (36.9 C)  99.8 F (37.7 C) (P) 99.1 F (37.3 C)  TempSrc: Oral  Oral   SpO2: 94% 94% 92% 97%  Weight:      Height:         Intake/Output Summary (Last 24 hours) at 01/14/2019 1811 Last data filed at 01/14/2019 1646 Gross per 24 hour  Intake 841.82 ml  Output 850 ml  Net -8.18 ml   Filed Weights   01/13/19 0013  Weight: 123.4 kg   Examination: Physical Exam:  Constitutional: WN/WD morbidly obese Caucasian female currently in NAD and appears calm  Eyes: Lids and conjunctivae normal, sclerae anicteric  ENMT: External Ears, Nose appear normal. Grossly normal hearing.  Neck: Appears normal, supple, no cervical masses, normal ROM, no appreciable thyromegaly; no JVD Respiratory: Diminished to auscultation bilaterally, no wheezing, rales, rhonchi or crackles. Normal respiratory effort and patient is not tachypenic. No accessory muscle use.  Cardiovascular: RRR, no murmurs / rubs / gallops. S1 and S2 auscultated. Trace extremity edema. Abdomen: Soft, Tender on the LLQ, Distended due to body habitus. Bowel sounds positive.  GU: Deferred. Musculoskeletal: No clubbing / cyanosis of digits/nails. No joint deformity upper and lower extremities.  Skin: No rashes, lesions, ulcers on a limited skin evaluation. No induration; Warm and dry.  Neurologic: CN 2-12 grossly intact with no focal deficits. Romberg sign and cerebellar reflexes not assessed.  Psychiatric: Normal judgment and insight. Alert and oriented x 3. Normal mood and appropriate affect.   Data Reviewed: I have personally reviewed following labs and imaging studies  CBC: Recent Labs  Lab 01/12/19 2259 01/13/19 0542 01/14/19 0537  WBC 14.6* 19.4* 12.8*  NEUTROABS  --   --  10.4*  HGB 11.8* 10.9* 10.4*  HCT 37.3 34.5* 34.4*  MCV 90.3 90.3 91.0  PLT 228 230 [id number]   Basic Metabolic Panel: Recent Labs  Lab 01/12/19 2259 01/13/19 0542 01/13/19 0948 01/14/19 0537  NA 131* 133*  --  136  K 4.6 4.9  --  4.5  CL 101 102  --  105  CO2 19* 19*  --  20*  GLUCOSE 465* 451*  --  203*  BUN 32* 30*  --  21  CREATININE 1.81* 1.49*  --  1.43*  CALCIUM  8.2* 8.0*  --  8.2*  MG  --   --  2.4 2.3  PHOS  --   --   --  2.4*   GFR: Estimated Creatinine Clearance: 45.9 mL/min (A) (by C-G formula based on SCr of 1.43 mg/dL (H)). Liver Function Tests: Recent Labs  Lab 01/13/19 0542 01/14/19 0537  AST 16 14*  ALT 33 24  ALKPHOS 123 98  BILITOT 0.6 1.0  PROT 6.1* 6.1*  ALBUMIN 2.5* 2.5*   No results for input(s): LIPASE, AMYLASE in the last 168 hours. No results for input(s): AMMONIA in the last 168 hours. Coagulation Profile: No results for input(s): INR, PROTIME in the last 168 hours. Cardiac Enzymes: No results for input(s): CKTOTAL,  CKMB, CKMBINDEX, TROPONINI in the last 168 hours. BNP (last 3 results) No results for input(s): PROBNP in the last 8760 hours. HbA1C: Recent Labs    01/13/19 0542  HGBA1C 8.7*   CBG: Recent Labs  Lab 01/13/19 1714 01/13/19 2025 01/14/19 0748 01/14/19 1149 01/14/19 1632  GLUCAP 214* 226* 186* 238* 199*   Lipid Profile: No results for input(s): CHOL, HDL, LDLCALC, TRIG, CHOLHDL, LDLDIRECT in the last 72 hours. Thyroid Function Tests: Recent Labs    01/13/19 0542  TSH 2.426   Anemia Panel: No results for input(s): VITAMINB12, FOLATE, FERRITIN, TIBC, IRON, RETICCTPCT in the last 72 hours. Sepsis Labs: No results for input(s): PROCALCITON, LATICACIDVEN in the last 168 hours.  Recent Results (from the past 240 hour(s))  SARS CORONAVIRUS 2 (TAT 6-24 HRS) Nasopharyngeal Nasopharyngeal Swab     Status: None   Collection Time: 01/13/19 12:24 AM   Specimen: Nasopharyngeal Swab  Result Value Ref Range Status   SARS Coronavirus 2 NEGATIVE NEGATIVE Final    Comment: (NOTE) SARS-CoV-2 target nucleic acids are NOT DETECTED. The SARS-CoV-2 RNA is generally detectable in upper and lower respiratory specimens during the acute phase of infection. Negative results do not preclude SARS-CoV-2 infection, do not rule out co-infections with other pathogens, and should not be used as the sole basis for  treatment or other patient management decisions. Negative results must be combined with clinical observations, patient history, and epidemiological information. The expected result is Negative. Fact Sheet for Patients: SugarRoll.be Fact Sheet for Healthcare Providers: https://www.woods-mathews.com/ This test is not yet approved or cleared by the [place] FDA and  has been authorized for detection and/or diagnosis of SARS-CoV-2 by FDA under an Emergency Use Authorization (EUA). This EUA will remain  in effect (meaning this test can be used) for the duration of the COVID-19 declaration under Section 56 4(b)(1) of the Act, 21 U.S.C. section 360bbb-3(b)(1), unless the authorization is terminated or revoked sooner. Performed at [facility], [place] [address], [place], [place] [address]   Blood culture (routine x 2)     Status: None (Preliminary result)   Collection Time: 01/13/19 12:24 AM   Specimen: BLOOD  Result Value Ref Range Status   Specimen Description   Final    BLOOD RIGHT ANTECUBITAL Performed at [facility] [address], [place], [place] [address]    Special Requests   Final    BOTTLES DRAWN AEROBIC AND ANAEROBIC Blood Culture adequate volume Performed at [facility] [address] [place], [place] [address]    Culture   Final    NO GROWTH 1 DAY Performed at [facility], [place] [address] [place], [place] [address]    Report Status PENDING  Incomplete  Blood culture (routine x 2)     Status: None (Preliminary result)   Collection Time: 01/13/19  2:23 AM   Specimen: BLOOD  Result Value Ref Range Status   Specimen Description   Final    BLOOD LEFT ANTECUBITAL Performed at [facility] [address] [place], [place] [address]    Special Requests   Final    BOTTLES DRAWN AEROBIC AND ANAEROBIC Blood Culture adequate volume Performed at [facility] [address] [place], [place] [address]    Culture   Final    NO GROWTH < 24 HOURS Performed at [facility] [address], [place],  [address]    Report Status PENDING  Incomplete  Culture, Urine     Status: Abnormal (Preliminary result)   Collection Time: 01/13/19  8:04 AM   Specimen: Urine, Clean Catch  Result Value Ref Range Status   Specimen Description   Final    URINE, CLEAN CATCH Performed at [facility], [place] [address] [place], [place] [address]    Special Requests   Final    NONE Performed at [facility], [place] [address], [place], [place] [address]    Culture 10,000 COLONIES/mL ESCHERICHIA COLI (A)  Final   Report Status PENDING  Incomplete    Radiology Studies: Ct Abdomen Pelvis Wo Contrast  Result Date: 01/13/2019 CLINICAL DATA:  Weakness of fevers. EXAM: CT ABDOMEN AND PELVIS WITHOUT CONTRAST TECHNIQUE: Multidetector CT imaging of the abdomen and pelvis was performed following the standard protocol without IV contrast. COMPARISON:  CT 03/12/2011 FINDINGS: Lower chest: Lung bases are clear. Hepatobiliary: No focal hepatic lesion. Postcholecystectomy. No biliary dilatation. Pancreas: Pancreas is normal. No ductal dilatation. No pancreatic inflammation. Spleen: Normal spleen Adrenals/urinary tract: Adrenal glands normal. Mild hydronephrosis of the LEFT kidney secondary to obstructing calculus at the LEFT ureteropelvic junction. The calculus measures 7 mm (image 34/2). There is mild renal edema on the LEFT. Two additional nonobstructing calculi in lower pole of the LEFT kidney. No distal LEFT ureterolithiasis. No bladder calculi. The RIGHT kidney is stone free. No RIGHT ureterolithiasis. Stomach/Bowel: Stomach, small-bowel and cecum are normal. The appendix is not identified but there is no pericecal inflammation to suggest appendicitis. Multiple diverticula of the descending colon and sigmoid colon  without acute inflammation. Vascular/Lymphatic: Abdominal aorta is normal caliber. No periportal or retroperitoneal adenopathy. No pelvic adenopathy. Reproductive: Post hysterectomy Other: No free fluid. Musculoskeletal: No aggressive osseous lesion. IMPRESSION: 1. Obstructing calculus at the LEFT ureteropelvic junction. Mild LEFT hydronephrosis and renal edema. 2. LEFT nephrolithiasis. 3. LEFT colon diverticulosis without evidence diverticulitis. Electronically Signed   By: Suzy Bouchard M.D.   On: 01/13/2019 10:29   Nm Pulmonary Perfusion  Result Date: 01/13/2019 CLINICAL DATA:  Shortness of breath. EXAM: NUCLEAR MEDICINE PERFUSION LUNG SCAN TECHNIQUE: Perfusion images were obtained in multiple projections after intravenous injection of radiopharmaceutical. Ventilation scans intentionally deferred if perfusion scan and chest x-ray adequate for interpretation during COVID 19 epidemic. RADIOPHARMACEUTICALS:  1.5 mCi Tc-81m MAA IV COMPARISON:  Chest x-ray January 12, 2019 FINDINGS: No segmental defects to suggest pulmonary emboli. IMPRESSION: No evidence of pulmonary embolus on this study. Electronically Signed   By: Dorise Bullion III M.D   On: 01/13/2019 02:32   Dg Chest Portable 1 View  Result Date: 01/12/2019 CLINICAL DATA:  Weakness.  Shortness of breath. EXAM: PORTABLE CHEST 1 VIEW COMPARISON:  November 08, 2010 FINDINGS: Cardiomegaly. The hila and mediastinum are normal. No pneumothorax. Haziness over the bases may simply be due to overlapping soft tissues and patient body habitus. No definitive infiltrate identified. No over pulmonary edema noted. IMPRESSION: The study is limited due to patient body habitus. Within this limitation, no focal infiltrates or overt edema are identified. Electronically Signed   By: Dorise Bullion III M.D   On: 01/12/2019 23:46   Dg C-arm 1-60 Min-no Report  Result Date: 01/14/2019 Fluoroscopy was utilized by the requesting physician.  No radiographic  interpretation.   Scheduled Meds:  albuterol       [MAR Hold] aspirin EC  81 mg Oral Daily   [MAR Hold] carvedilol  3.125 mg Oral BID WC   [MAR Hold] folic acid  1 mg Oral  Daily   [MAR Hold] insulin aspart  0-15 Units Subcutaneous TID WC   [MAR Hold] insulin aspart  6 Units Subcutaneous TID WC   [MAR Hold] insulin glargine  24 Units Subcutaneous Daily   [MAR Hold] pantoprazole  40 mg Oral Daily   phosphorus  500 mg Oral Once   [MAR Hold] psyllium  1 packet Oral BID   [MAR Hold] rivaroxaban  20 mg Oral QPM   [MAR Hold] simvastatin  10 mg Oral QHS   [MAR Hold] sotalol  80 mg Oral BID   [MAR Hold] tamsulosin  0.4 mg Oral Daily   Continuous Infusions:  [MAR Hold] cefTRIAXone (ROCEPHIN)  IV 1 g (01/13/19 2350)   cefTRIAXone (ROCEPHIN) IVPB 1 gram/100 mL NS (Mini-Bag Plus)      LOS: 1 day   Kerney Elbe, DO [facility] PAGER is on AMION  If 7PM-7AM, please contact night-coverage [email or web address]

## 2019-01-15 ENCOUNTER — Encounter (HOSPITAL_COMMUNITY): Payer: Self-pay | Admitting: Urology

## 2019-01-15 ENCOUNTER — Inpatient Hospital Stay (HOSPITAL_COMMUNITY): Payer: Medicare Other

## 2019-01-15 LAB — COMPREHENSIVE METABOLIC PANEL
ALT: 27 U/L (ref 0–44)
AST: 14 U/L — ABNORMAL LOW (ref 15–41)
Albumin: 2.4 g/dL — ABNORMAL LOW (ref 3.5–5.0)
Alkaline Phosphatase: 105 U/L (ref 38–126)
Anion gap: 11 (ref 5–15)
BUN: 21 mg/dL (ref 8–23)
CO2: 22 mmol/L (ref 22–32)
Calcium: 8.4 mg/dL — ABNORMAL LOW (ref 8.9–10.3)
Chloride: 105 mmol/L (ref 98–111)
Creatinine, Ser: 1.26 mg/dL — ABNORMAL HIGH (ref 0.44–1.00)
GFR calc Af Amer: 50 mL/min — ABNORMAL LOW (ref >60)
GFR calc non Af Amer: 43 mL/min — ABNORMAL LOW (ref >60)
Glucose, Bld: 270 mg/dL — ABNORMAL HIGH (ref 70–99)
Potassium: 4.3 mmol/L (ref 3.5–5.1)
Sodium: 138 mmol/L (ref 135–145)
Total Bilirubin: 0.5 mg/dL (ref 0.3–1.2)
Total Protein: 6.1 g/dL — ABNORMAL LOW (ref 6.5–8.1)

## 2019-01-15 LAB — CBC WITH DIFFERENTIAL/PLATELET
Abs Immature Granulocytes: 0.38 10*3/uL — ABNORMAL HIGH (ref 0.00–0.07)
Basophils Absolute: 0.1 10*3/uL (ref 0.0–0.1)
Basophils Relative: 1 %
Eosinophils Absolute: 0 10*3/uL (ref 0.0–0.5)
Eosinophils Relative: 0 %
HCT: 35 % — ABNORMAL LOW (ref 36.0–46.0)
Hemoglobin: 10.3 g/dL — ABNORMAL LOW (ref 12.0–15.0)
Immature Granulocytes: 4 %
Lymphocytes Relative: 7 %
Lymphs Abs: 0.8 10*3/uL (ref 0.7–4.0)
MCH: 27.7 pg (ref 26.0–34.0)
MCHC: 29.4 g/dL — ABNORMAL LOW (ref 30.0–36.0)
MCV: 94.1 fL (ref 80.0–100.0)
Monocytes Absolute: 0.5 10*3/uL (ref 0.1–1.0)
Monocytes Relative: 4 %
Neutro Abs: 9.2 10*3/uL — ABNORMAL HIGH (ref 1.7–7.7)
Neutrophils Relative %: 84 %
Platelets: 241 10*3/uL (ref 150–400)
RBC: 3.72 MIL/uL — ABNORMAL LOW (ref 3.87–5.11)
RDW: 14.1 % (ref 11.5–15.5)
WBC: 10.9 10*3/uL — ABNORMAL HIGH (ref 4.0–10.5)
nRBC: 0 % (ref 0.0–0.2)

## 2019-01-15 LAB — URINE CULTURE: Culture: 10000 — AB

## 2019-01-15 LAB — GLUCOSE, CAPILLARY
Glucose-Capillary: 237 mg/dL — ABNORMAL HIGH (ref 70–99)
Glucose-Capillary: 243 mg/dL — ABNORMAL HIGH (ref 70–99)
Glucose-Capillary: 263 mg/dL — ABNORMAL HIGH (ref 70–99)
Glucose-Capillary: 275 mg/dL — ABNORMAL HIGH (ref 70–99)
Glucose-Capillary: 293 mg/dL — ABNORMAL HIGH (ref 70–99)

## 2019-01-15 LAB — PHOSPHORUS: Phosphorus: 4.4 mg/dL (ref 2.5–4.6)

## 2019-01-15 LAB — MAGNESIUM: Magnesium: 2.4 mg/dL (ref 1.7–2.4)

## 2019-01-15 MED ORDER — LIVING WELL WITH DIABETES BOOK
Freq: Once | Status: AC
  Administered 2019-01-15: 22:00:00
  Filled 2019-01-15: qty 1

## 2019-01-15 MED ORDER — INSULIN ASPART 100 UNIT/ML ~~LOC~~ SOLN
5.0000 [IU] | Freq: Once | SUBCUTANEOUS | Status: AC
  Administered 2019-01-15: 5 [IU] via SUBCUTANEOUS

## 2019-01-15 MED ORDER — SODIUM CHLORIDE 0.9 % IV SOLN
1.0000 g | Freq: Three times a day (TID) | INTRAVENOUS | Status: AC
  Administered 2019-01-15 – 2019-01-20 (×14): 1 g via INTRAVENOUS
  Filled 2019-01-15 (×15): qty 1

## 2019-01-15 MED ORDER — IPRATROPIUM-ALBUTEROL 0.5-2.5 (3) MG/3ML IN SOLN
3.0000 mL | Freq: Four times a day (QID) | RESPIRATORY_TRACT | Status: DC
  Administered 2019-01-15: 3 mL via RESPIRATORY_TRACT
  Filled 2019-01-15: qty 3

## 2019-01-15 MED ORDER — IPRATROPIUM-ALBUTEROL 0.5-2.5 (3) MG/3ML IN SOLN
3.0000 mL | Freq: Two times a day (BID) | RESPIRATORY_TRACT | Status: DC
  Administered 2019-01-16 – 2019-01-17 (×3): 3 mL via RESPIRATORY_TRACT
  Filled 2019-01-15 (×3): qty 3

## 2019-01-15 NOTE — Anesthesia Postprocedure Evaluation (Signed)
Anesthesia Post Note  Patient: Karen Rasmussen  Procedure(s) Performed: CYSTOSCOPy  STENT PLACEMENT (Left Ureter)     Patient location during evaluation: PACU Anesthesia Type: General Level of consciousness: awake and alert and oriented Pain management: pain level controlled Vital Signs Assessment: post-procedure vital signs reviewed and stable Respiratory status: spontaneous breathing, nonlabored ventilation and respiratory function stable Cardiovascular status: blood pressure returned to baseline Postop Assessment: no apparent nausea or vomiting Anesthetic complications: no                Brennan Bailey

## 2019-01-15 NOTE — Progress Notes (Signed)
Patient mentioned she wanted to contact her GI doctor to inform them that she is in the hospital. The patient stated she was suppose to have a modified barium swallow test done, because she has trouble swallowing. Patient stated she coughs up phlegm after eating or swallowing pills.

## 2019-01-15 NOTE — Progress Notes (Signed)
PROGRESS NOTE    LACOSTA AX  [id number] DOB: 17-Jul-1945 DOA: 01/12/2019 PCP: Kathyrn Lass, MD   Brief Narrative:  Patient is a morbidly obese Caucasain female with past medical history significant for but not limited to benign essential hypertension, chronic diastolic CHF and dilated cardiomyopathy, GERD, hyperlipidemia, history of nephrolithiasis, morbid obesity, OSA on CPAP, persistent atrial fibrillation, type 2 diabetes mellitus, vitamin D deficiency and other comorbidities who presents with a chief complaint of weakness which is global, dry cough, night sweats and increased sugars.  She reports that she is also had diarrhea recently. She states that symptoms have been going on for a little while now for at least 1 to 2 weeks and she has been more fatigued.   Of note she also had some flank pain and was worked up and found to have urinary tract infection.  A CT scan of the abdomen pelvis was done which showed an obstructing left ureteral calculus.  Urology was consulted and took the patient to the OR for stent placement and definitive laser lithotripsy eventually in the outpatient setting.  Currently she is on antibiotics with IV ceftriaxone which we will continue for now.  She is postoperative day 1 stenting and renal function is improving but patient continues to be significantly weak so PT and OT will be consulted for further evaluation.  She is also having some dysphagia so SLP evaluation has also been ordered  Assessment & Plan:   Active Problems:   Abdominal pain, left lower quadrant   OSA (obstructive sleep apnea)   GERD (gastroesophageal reflux disease)   Morbid obesity with BMI of 40.0-44.9, adult (HCC)   Chronic diastolic CHF (congestive heart failure) (HCC)   Benign essential HTN   Persistent atrial fibrillation (HCC)   DCM (dilated cardiomyopathy) (HCC)   Hypokalemia   Hyperosmolar hyperglycemic state (HHS) (HCC)   Acute renal injury ([place])   Urinary tract infection  without hematuria   Elevated troponin   Left nephrolithiasis  UTI in the setting of obstructing Left Ureteral Nephrolithiasis s/p Left Ureteral Stenting -WBC went from 14.6 -> 19.4 -> 12.8 -> 10.8 -Urinalysis showed a hazy appearance with greater than 500 glucose, large hemoglobin, 5 ketones, large leukocytes, no bacteria seen, greater than 50 RBCs per high-power field, and greater than 50 WBCs Patient is complaining of low back pain more so on the left side compared to the right -Blood cultures x2 were obtained however unfortunately urine cultures not obtained until after she was started on antibiotics -Received IV ceftriaxone which we will continue for now -IVF now stopped  -Check CT of the abdomen pelvis without contrast and showed an obstructing calculus at the left uteropelvic junction and mild left hydronephrosis and renal edema along with left nephrolithiasis and left colon diverticulosis without evidence of diverticulitis -Urine culture showed 10,000 colonies of E. Coli with Sensitivities pending  -Urology was consulted for further evaluation recommendations and Dr. Claudia Desanctis recommended cystoscopy with left ureteral stent placement with eventual staged ureteroscopic laser lithotripsy for definitive stone management -Continue with IV antibiotics and further care per Urology; I spoke with Urology in the patient currently does not need a Foley catheter  Persistent Atrial Fibrillation currently in NSR -Continue with Carvedilol and Sotalol -Continue with Rivaroxaban -She received subcu Lovenox last night -Cardiology was consulted for further evaluation recommendations given her Elevated Troponin by Admitting Physician   Uncontrolled Diabetes Mellitus Type 2  -Hemoglobin A1c was 8.7 -Takes Metformin and glimepiride at home which have both been held  -  In the setting of infection -Patient blood sugar on admission was 465 and repeat was 451 and CBGs ranging from 339-424; Now CBG's ranging from  191-275 -Continue with moderate NovoLog/scale insulin and was given insulin aspart 6 units 3 times daily with meals and also given Lantus 10 units subcu nightly -Consulted diabetes education coordinator consulted for further evaluation recommendations  Dilated Cardiomyopathy and history of Diastolic CHF with an EF of 45 to 50% -Continue with carvedilol and hold spironolactone for now -Strict I's and O's and daily weights  -BNP was 242.1 on admission and troponin was initially elevated at 48 and trending down to 33 -Cardiology was consulted for further evaluation recommendations they suspect that her troponin elevation in the context of her infection \ -They recommend no further cardiac work-up is indicated and recommending diuresing gently however given her dehydration we will continue fluids for today and resume her spironolactone in the a.m. -Continue to monitor for signs symptoms of volume overload -Repeat chest x-ray in the a.m.  AKI on CKD Stage 3b Metabolic Acidosis -In the setting of Infection and Dehydration -Patient's BUN/creatinine went from 32/1.81 and is now 21/1.26 -Hold Spironolactone for now -Patient's CO2 is 19, chloride level 102, and anion gap was 12; Now patient's CO2 is 20, anion gap is 11, and chloride levels 105 -Given IVF with NS but stopped  -Avoid nephrotoxic medications, contrast dyes, hypotension if possible and renally adjust medications  -Continue monitor trend renal function n -Repeat CMP in the a.m.  Generalized Weakness likely in the setting of infection rule out other etiologies -Get PT/OT evaluation and still pending  -Of note SARS-CoV-2 testing was negative -Gentle IVF Hydration with normal saline at a rate of 75 mL's per hour for another 12 more hours and stopped now -Continue to treat infection and follow clinical response  HLD -C/w SimvaStatin 10 mg po qHS  Hyponatremia -Mild on admission and trending upwards  -Patient's sodium went from  131 and is now improved to 138 -IVF now stopped -Continue to monitor and trend and repeat CMP in a.m.  OSA -Will order CPAP  Morbid Obesity -Estimated body mass index is 49.75 kg/m as calculated from the following:   Height as of this encounter: 5\' 2"  (1.575 m).   Weight as of this encounter: 123.4 kg. -Weight Loss and Dietary Counseling given   Hypophosphatemia -Patient's phosphorus level was 4.4 -Continue to Monitor and Replete as Necessary -Repeat Phos Level in AM   Normocytic Anemia -Patient's Hgb/Hct went from 10.4/34.4 -> 10.3/35.0 -Check Anemia Panel in the AM -In the Setting of Chronic Kidney Diseased and Dilutional Drop -Continue to Monitor for S/Sx of Bleeding as she is on Anticoagulation with Xarelto; Currently no overt bleeding noted.  -Repeat CBC in AM   Dysphagia -Check SLP Evaluation; Was to have a MBS in the outpatient setting  DVT prophylaxis: Anticoagulated with Xarelto  Code Status: FULL CODE  Family Communication: No family present at bedside  Disposition Plan: Pending further workup and treatment  Consultants:   Cardiology  Urology   Procedures:  Cystoscopy    Antimicrobials:  Anti-infectives (From admission, onward)   Start     Dose/Rate Route Frequency Ordered Stop   01/14/19 1655  sodium chloride 0.9 % with cefTRIAXone (ROCEPHIN) ADS Med    Note to Pharmacy: Octavio Graves   : cabinet override      01/14/19 1655 01/15/19 0459   01/13/19 2200  cefTRIAXone (ROCEPHIN) 1 g in sodium chloride 0.9 % 100 mL IVPB  1 g 200 mL/hr over 30 Minutes Intravenous Daily at bedtime 01/13/19 0532     01/13/19 0230  cefTRIAXone (ROCEPHIN) 1 g in sodium chloride 0.9 % 100 mL IVPB     1 g 200 mL/hr over 30 Minutes Intravenous  Once 01/13/19 0223 01/13/19 0537     Subjective: Seen and examined at bedside and she is still having some left-sided pain radiating to her groin.  Also complained of some burning discomfort in her urine.  No nausea or  vomiting.  Still feels significantly weak and fatigued.  Denies any other concerns or complaints at this time.  Objective: Vitals:   01/14/19 1844 01/14/19 2029 01/15/19 0419 01/15/19 0836  BP: (!) 102/57 131/69 (!) 126/56 (!) 141/55  Pulse: 68 66 (!) 59 66  Resp: 20 18 18 18   Temp: 98.3 F (36.8 C) 97.8 F (36.6 C) 97.7 F (36.5 C) 97.8 F (36.6 C)  TempSrc: Oral Oral Tympanic Oral  SpO2: 95% 94% 95% 99%  Weight:      Height:        Intake/Output Summary (Last 24 hours) at 01/15/2019 1359 Last data filed at 01/15/2019 0600 Gross per 24 hour  Intake 840 ml  Output 1700 ml  Net -860 ml   Filed Weights   01/13/19 0013  Weight: 123.4 kg   Examination: Physical Exam:  Constitutional: WN/WD morbidly obese Caucasian female in NAD and appears anxious a little tearful Eyes: Lids and conjunctivae normal, sclerae anicteric  ENMT: External Ears, Nose appear normal. Grossly normal hearing.  Neck: Appears normal, supple, no cervical masses, normal ROM, no appreciable thyromegaly; no JVD Respiratory: Diminished to auscultation bilaterally, no wheezing, rales, rhonchi or crackles. Normal respiratory effort and patient is not tachypenic. No accessory muscle use. Unlabored breathing  Cardiovascular: RRR, no murmurs / rubs / gallops. S1 and S2 auscultated. Mild to 1+ LE extremity edema.  Abdomen: Soft, Tender to palpate in the LLQ, Distended 2/2 body habitus. Bowel sounds positive x4.  GU: Deferred. Musculoskeletal: No clubbing / cyanosis of digits/nails. No joint deformity upper and lower extremities. G Skin: No rashes, lesions, ulcers. No induration; Warm and dry.  Neurologic: CN 2-12 grossly intact with no focal deficits. Romberg sign and cerebellar reflexes not assessed.  Psychiatric: Normal judgment and insight. Alert and oriented x 3. Mildly anxious mood and appropriate affect.   Data Reviewed: I have personally reviewed following labs and imaging studies  CBC: Recent Labs  Lab  01/12/19 2259 01/13/19 0542 01/14/19 0537 01/15/19 0428  WBC 14.6* 19.4* 12.8* 10.9*  NEUTROABS  --   --  10.4* 9.2*  HGB 11.8* 10.9* 10.4* 10.3*  HCT 37.3 34.5* 34.4* 35.0*  MCV 90.3 90.3 91.0 94.1  PLT 228 230 237 [id number]   Basic Metabolic Panel: Recent Labs  Lab 01/12/19 2259 01/13/19 0542 01/13/19 0948 01/14/19 0537 01/15/19 0428  NA 131* 133*  --  136 138  K 4.6 4.9  --  4.5 4.3  CL 101 102  --  105 105  CO2 19* 19*  --  20* 22  GLUCOSE 465* 451*  --  203* 270*  BUN 32* 30*  --  21 21  CREATININE 1.81* 1.49*  --  1.43* 1.26*  CALCIUM 8.2* 8.0*  --  8.2* 8.4*  MG  --   --  2.4 2.3 2.4  PHOS  --   --   --  2.4* 4.4   GFR: Estimated Creatinine Clearance: 52.1 mL/min (A) (by C-G formula based on SCr  of 1.26 mg/dL (H)). Liver Function Tests: Recent Labs  Lab 01/13/19 0542 01/14/19 0537 01/15/19 0428  AST 16 14* 14*  ALT 33 24 27  ALKPHOS 123 98 105  BILITOT 0.6 1.0 0.5  PROT 6.1* 6.1* 6.1*  ALBUMIN 2.5* 2.5* 2.4*   No results for input(s): LIPASE, AMYLASE in the last 168 hours. No results for input(s): AMMONIA in the last 168 hours. Coagulation Profile: No results for input(s): INR, PROTIME in the last 168 hours. Cardiac Enzymes: No results for input(s): CKTOTAL, CKMB, CKMBINDEX, TROPONINI in the last 168 hours. BNP (last 3 results) No results for input(s): PROBNP in the last 8760 hours. HbA1C: Recent Labs    01/13/19 0542  HGBA1C 8.7*   CBG: Recent Labs  Lab 01/14/19 1632 01/14/19 1811 01/15/19 0000 01/15/19 0728 01/15/19 1151  GLUCAP 199* 191* 275* 243* 237*   Lipid Profile: No results for input(s): CHOL, HDL, LDLCALC, TRIG, CHOLHDL, LDLDIRECT in the last 72 hours. Thyroid Function Tests: Recent Labs    01/13/19 0542  TSH 2.426   Anemia Panel: No results for input(s): VITAMINB12, FOLATE, FERRITIN, TIBC, IRON, RETICCTPCT in the last 72 hours. Sepsis Labs: No results for input(s): PROCALCITON, LATICACIDVEN in the last 168 hours.  Recent  Results (from the past 240 hour(s))  SARS CORONAVIRUS 2 (TAT 6-24 HRS) Nasopharyngeal Nasopharyngeal Swab     Status: None   Collection Time: 01/13/19 12:24 AM   Specimen: Nasopharyngeal Swab  Result Value Ref Range Status   SARS Coronavirus 2 NEGATIVE NEGATIVE Final    Comment: (NOTE) SARS-CoV-2 target nucleic acids are NOT DETECTED. The SARS-CoV-2 RNA is generally detectable in upper and lower respiratory specimens during the acute phase of infection. Negative results do not preclude SARS-CoV-2 infection, do not rule out co-infections with other pathogens, and should not be used as the sole basis for treatment or other patient management decisions. Negative results must be combined with clinical observations, patient history, and epidemiological information. The expected result is Negative. Fact Sheet for Patients: SugarRoll.be Fact Sheet for Healthcare Providers: https://www.woods-mathews.com/ This test is not yet approved or cleared by the [place] FDA and  has been authorized for detection and/or diagnosis of SARS-CoV-2 by FDA under an Emergency Use Authorization (EUA). This EUA will remain  in effect (meaning this test can be used) for the duration of the COVID-19 declaration under Section 56 4(b)(1) of the Act, 21 U.S.C. section 360bbb-3(b)(1), unless the authorization is terminated or revoked sooner. Performed at [facility], [place] [address] [place], [place] [address]   Blood culture (routine x 2)     Status: None (Preliminary result)   Collection Time: 01/13/19 12:24 AM   Specimen: BLOOD  Result Value Ref Range Status   Specimen Description   Final    BLOOD RIGHT ANTECUBITAL Performed at [facility] [address] [place], [place] [address]    Special Requests   Final    BOTTLES DRAWN AEROBIC AND ANAEROBIC Blood Culture adequate volume Performed at [facility]  [address] [place], [place] [address]    Culture   Final    NO GROWTH 1 DAY Performed at [facility], [place] [address], [place], [place] [address]    Report Status PENDING  Incomplete  Blood culture (routine x 2)     Status: None (Preliminary result)   Collection Time: 01/13/19  2:23 AM   Specimen: BLOOD  Result Value Ref Range Status   Specimen Description  Final    BLOOD LEFT ANTECUBITAL Performed at [facility] [address] [place], [place] [address]    Special Requests   Final    BOTTLES DRAWN AEROBIC AND ANAEROBIC Blood Culture adequate volume Performed at [facility] [address], [place], [place] [address]    Culture   Final    NO GROWTH < 24 HOURS Performed at [facility] [address], [place], [place] [address]    Report Status PENDING  Incomplete  Culture, Urine     Status: Abnormal (Preliminary result)   Collection Time: 01/13/19  8:04 AM   Specimen: Urine, Clean Catch  Result Value Ref Range Status   Specimen Description   Final    URINE, CLEAN CATCH Performed at [facility], [place] [address], [place], [place] [address]    Special Requests   Final    NONE Performed at [facility], [place] [address], [place], [place] [address]    Culture 10,000 COLONIES/mL ESCHERICHIA COLI (A)  Final   Report Status PENDING  Incomplete  MRSA PCR Screening     Status: None   Collection Time: 01/14/19  5:10 PM   Specimen: Nasal Mucosa; Nasopharyngeal  Result Value Ref Range Status   MRSA by PCR NEGATIVE NEGATIVE Final    Comment:        The GeneXpert MRSA Assay (FDA approved for NASAL specimens only), is one component of a comprehensive MRSA colonization surveillance program. It is not intended to diagnose MRSA infection nor to guide or monitor treatment for MRSA infections. Performed at [facility], [place] [address], [place],  [address]      Radiology Studies: Dg C-arm 1-60 Min-no Report  Result Date: 01/14/2019 Fluoroscopy was utilized by the requesting physician.  No radiographic interpretation.   Scheduled Meds: . aspirin EC  81 mg Oral Daily  . carvedilol  3.125 mg Oral BID WC  . folic acid  1 mg Oral Daily  . insulin aspart  0-15 Units Subcutaneous TID WC  . insulin aspart  6 Units Subcutaneous TID WC  . insulin glargine  24 Units Subcutaneous Daily  . pantoprazole  40 mg Oral Daily  . psyllium  1 packet Oral BID  . rivaroxaban  20 mg Oral QPM  . simvastatin  10 mg Oral QHS  . sotalol  80 mg Oral BID  . tamsulosin  0.4 mg Oral Daily   Continuous Infusions: . cefTRIAXone (ROCEPHIN)  IV 1 g (01/14/19 2128)    LOS: 2 days   Kerney Elbe, DO [facility] PAGER is on [place]  If 7PM-7AM, please contact night-coverage [email or web address]

## 2019-01-15 NOTE — Progress Notes (Signed)
Pt is not ready for cpap at this time.  This writer will return around midnight per pt request.

## 2019-01-15 NOTE — Progress Notes (Signed)
Inpatient Diabetes Program Recommendations  AACE/ADA: New Consensus Statement on Inpatient Glycemic Control (2015)  Target Ranges:  Prepandial:   less than 140 mg/dL      Peak postprandial:   less than 180 mg/dL (1-2 hours)      Critically ill patients:  140 - 180 mg/dL   Lab Results  Component Value Date   GLUCAP 293 (H) 01/15/2019   HGBA1C 8.7 (H) 01/13/2019    Review of Glycemic Control  Current: Lantus 24 units QD, Novolog 0-15 units tidwc + 6 units tidwc  Blood sugars continue > goal of 180 mg/dL. Needs titration.  Spoke with pt regarding her glucose control, HgbA1C results, and glucose monitoring at home. Pt states she was given a meter, but has not used it. Recently ordered exercise equipment to use at home. Trying to eat healthier but states "I know I just eat too much." Is not opposed to being discharged on insulin if needed.  Inpatient Diabetes Program Recommendations:     Increase Lantus to 30 units QD Increase Novolog to 8 units tidwc for meal coverage insulin.  Discussed lifestyle modification to better control blood sugars with weight loss, healthy diet and exercising daily. Pt said she is motivated to make these changes.  Will order Living Well with Diabetes book and will f/u in am. Spoke with RN.  Thank you. Lorenda Peck, RD, LDN, CDE Inpatient Diabetes Coordinator [phone]

## 2019-01-16 DIAGNOSIS — A498 Other bacterial infections of unspecified site: Secondary | ICD-10-CM

## 2019-01-16 LAB — CBC WITH DIFFERENTIAL/PLATELET
Abs Immature Granulocytes: 0.79 10*3/uL — ABNORMAL HIGH (ref 0.00–0.07)
Basophils Absolute: 0.1 10*3/uL (ref 0.0–0.1)
Basophils Relative: 1 %
Eosinophils Absolute: 0.1 10*3/uL (ref 0.0–0.5)
Eosinophils Relative: 1 %
HCT: 35.3 % — ABNORMAL LOW (ref 36.0–46.0)
Hemoglobin: 10.6 g/dL — ABNORMAL LOW (ref 12.0–15.0)
Immature Granulocytes: 6 %
Lymphocytes Relative: 19 %
Lymphs Abs: 2.5 10*3/uL (ref 0.7–4.0)
MCH: 27.7 pg (ref 26.0–34.0)
MCHC: 30 g/dL (ref 30.0–36.0)
MCV: 92.2 fL (ref 80.0–100.0)
Monocytes Absolute: 1.1 10*3/uL — ABNORMAL HIGH (ref 0.1–1.0)
Monocytes Relative: 8 %
Neutro Abs: 8.6 10*3/uL — ABNORMAL HIGH (ref 1.7–7.7)
Neutrophils Relative %: 65 %
Platelets: 330 10*3/uL (ref 150–400)
RBC: 3.83 MIL/uL — ABNORMAL LOW (ref 3.87–5.11)
RDW: 14.1 % (ref 11.5–15.5)
WBC: 13.1 10*3/uL — ABNORMAL HIGH (ref 4.0–10.5)
nRBC: 0 % (ref 0.0–0.2)

## 2019-01-16 LAB — COMPREHENSIVE METABOLIC PANEL
ALT: 25 U/L (ref 0–44)
AST: 15 U/L (ref 15–41)
Albumin: 2.5 g/dL — ABNORMAL LOW (ref 3.5–5.0)
Alkaline Phosphatase: 90 U/L (ref 38–126)
Anion gap: 10 (ref 5–15)
BUN: 23 mg/dL (ref 8–23)
CO2: 23 mmol/L (ref 22–32)
Calcium: 8.3 mg/dL — ABNORMAL LOW (ref 8.9–10.3)
Chloride: 106 mmol/L (ref 98–111)
Creatinine, Ser: 1.02 mg/dL — ABNORMAL HIGH (ref 0.44–1.00)
GFR calc Af Amer: >60 mL/min (ref >60)
GFR calc non Af Amer: 56 mL/min — ABNORMAL LOW (ref >60)
Glucose, Bld: 227 mg/dL — ABNORMAL HIGH (ref 70–99)
Potassium: 4.1 mmol/L (ref 3.5–5.1)
Sodium: 139 mmol/L (ref 135–145)
Total Bilirubin: 1 mg/dL (ref 0.3–1.2)
Total Protein: 6 g/dL — ABNORMAL LOW (ref 6.5–8.1)

## 2019-01-16 LAB — RETICULOCYTES
Immature Retic Fract: 37.8 % — ABNORMAL HIGH (ref 2.3–15.9)
RBC.: 3.79 MIL/uL — ABNORMAL LOW (ref 3.87–5.11)
Retic Count, Absolute: 70.9 10*3/uL (ref 19.0–186.0)
Retic Ct Pct: 1.9 % (ref 0.4–3.1)

## 2019-01-16 LAB — PHOSPHORUS: Phosphorus: 3 mg/dL (ref 2.5–4.6)

## 2019-01-16 LAB — GLUCOSE, CAPILLARY
Glucose-Capillary: 172 mg/dL — ABNORMAL HIGH (ref 70–99)
Glucose-Capillary: 201 mg/dL — ABNORMAL HIGH (ref 70–99)
Glucose-Capillary: 175 mg/dL — ABNORMAL HIGH (ref 70–99)
Glucose-Capillary: 175 mg/dL — ABNORMAL HIGH (ref 70–99)

## 2019-01-16 LAB — FOLATE: Folate: 9.6 ng/mL (ref >5.9)

## 2019-01-16 LAB — IRON AND TIBC
Iron: 57 ug/dL (ref 28–170)
Saturation Ratios: 17 % (ref 10.4–31.8)
TIBC: 329 ug/dL (ref 250–450)
UIBC: 272 ug/dL

## 2019-01-16 LAB — MAGNESIUM: Magnesium: 2.2 mg/dL (ref 1.7–2.4)

## 2019-01-16 LAB — FERRITIN: Ferritin: 226 ng/mL (ref 11–307)

## 2019-01-16 LAB — VITAMIN B12: Vitamin B-12: 924 pg/mL — ABNORMAL HIGH (ref 180–914)

## 2019-01-16 MED ORDER — INSULIN GLARGINE 100 UNIT/ML ~~LOC~~ SOLN
30.0000 [IU] | Freq: Every day | SUBCUTANEOUS | Status: DC
  Administered 2019-01-16 – 2019-01-17 (×2): 30 [IU] via SUBCUTANEOUS
  Filled 2019-01-16 (×3): qty 0.3

## 2019-01-16 MED ORDER — INSULIN ASPART 100 UNIT/ML ~~LOC~~ SOLN
8.0000 [IU] | Freq: Three times a day (TID) | SUBCUTANEOUS | Status: DC
  Administered 2019-01-16 – 2019-01-17 (×5): 8 [IU] via SUBCUTANEOUS

## 2019-01-16 MED ORDER — ADULT MULTIVITAMIN W/MINERALS CH
1.0000 | ORAL_TABLET | Freq: Every day | ORAL | Status: DC
  Administered 2019-01-16 – 2019-01-20 (×5): 1 via ORAL
  Filled 2019-01-16 (×5): qty 1

## 2019-01-16 MED ORDER — CENTRUM SILVER PO CHEW: 2.0000 | CHEWABLE_TABLET | Freq: Every day | ORAL | Status: DC

## 2019-01-16 MED ORDER — SPIRONOLACTONE 12.5 MG HALF TABLET
12.5000 mg | ORAL_TABLET | Freq: Every day | ORAL | Status: DC
  Administered 2019-01-16 – 2019-01-20 (×5): 12.5 mg via ORAL
  Filled 2019-01-16 (×5): qty 1

## 2019-01-16 MED ORDER — VITAMIN D3 25 MCG (1000 UNIT) PO TABS
5000.0000 [IU] | ORAL_TABLET | Freq: Every day | ORAL | Status: DC
  Administered 2019-01-16 – 2019-01-20 (×5): 5000 [IU] via ORAL
  Filled 2019-01-16 (×5): qty 5

## 2019-01-16 NOTE — Progress Notes (Signed)
PT Cancellation Note  Patient Details Name: Karen Rasmussen MRN: [id number] DOB: [birth year]   Cancelled Treatment:    Reason Eval/Treat Not Completed: Fatigue/lethargy limiting ability to participate Pt reports no sleep last night and falling asleep while answering questions.  Pt requested PT to check back at a later time.   Nuha Degner,KATHrine E 01/16/2019, 12:30 PM Carmelia Bake, PT, DPT Acute Rehabilitation Services Office: [phone] Pager: [phone]

## 2019-01-16 NOTE — Progress Notes (Signed)
Pt refused CPAP qhs.  Pt states she is unable to tolerate our nasal mask.  Pt informed that she may have someone bring her nasal pillows in from home and we would adapt them to our hose.  Pt encouraged to contact RT should she change her mind.

## 2019-01-16 NOTE — Evaluation (Signed)
Physical Therapy Evaluation Patient Details Name: Karen Rasmussen MRN: [id number] DOB: Jun 29, 1945 Today's Date: 01/16/2019   History of Present Illness  73 yo morbidly obese Caucasain female with past medical history of benign essential hypertension, chronic diastolic CHF and dilated cardiomyopathy, GERD, hyperlipidemia, history of nephrolithiasis, morbid obesity, OSA on CPAP, persistent atrial fibrillation, type 2 diabetes mellitus, vitamin D deficiency and other comorbidities who presents with a chief complaint of weakness which is global, dry cough, night sweats and increased sugars. pt is postoperative day 2 stenting and lithotripsy, however  continues to be significantly weak  Clinical Impression  Pt admitted with above diagnosis.  Pt agreeable only with max encouragement, then quite cooperative with PT. Doubt pt will need f/u, may need RW for home. Will follow in acute setting.  Pt currently with functional limitations due to the deficits listed below (see PT Problem List). Pt will benefit from skilled PT to increase their independence and safety with mobility to allow discharge to the venue listed below.       Follow Up Recommendations No PT follow up    Equipment Recommendations  Rolling walker with 5" wheels(wide)    Recommendations for Other Services       Precautions / Restrictions Precautions Precautions: None Restrictions Weight Bearing Restrictions: No      Mobility  Bed Mobility Overal bed mobility: Modified Independent             General bed mobility comments: HOB elevated  Transfers Overall transfer level: Needs assistance Equipment used: Rolling walker (2 wheeled) Transfers: Sit to/from Stand Sit to Stand: Min guard         General transfer comment: cues for hand placement, min/guard for safety  Ambulation/Gait Ambulation/Gait assistance: Min guard Gait Distance (Feet): 80 Feet(15' more) Assistive device: Rolling walker (2 wheeled) Gait  Pattern/deviations: Step-through pattern;Wide base of support     General Gait Details: wide BOS d/t body habitus, cues for RW safety, unsteady initially however no overt LOB  Stairs            Wheelchair Mobility    Modified Rankin (Stroke Patients Only)       Balance Overall balance assessment: Mild deficits observed, not formally tested                                           Pertinent Vitals/Pain Pain Assessment: No/denies pain    Home Living Family/patient expects to be discharged to:: Private residence Living Arrangements: Alone Available Help at Discharge: Family Type of Home: House Home Access: Stairs to enter   Technical brewer of Steps: 4 short steps Home Layout: One level Home Equipment: Cane - single point      Prior Function Level of Independence: Independent with assistive device(s)         Comments: amb with cane at times     Hand Dominance        Extremity/Trunk Assessment   Upper Extremity Assessment Upper Extremity Assessment: Overall WFL for tasks assessed    Lower Extremity Assessment Lower Extremity Assessment: Overall WFL for tasks assessed       Communication   Communication: No difficulties  Cognition Arousal/Alertness: Awake/alert Behavior During Therapy: WFL for tasks assessed/performed Overall Cognitive Status: Within Functional Limits for tasks assessed  General Comments      Exercises     Assessment/Plan    PT Assessment Patient needs continued PT services  PT Problem List Decreased activity tolerance;Decreased balance;Decreased knowledge of use of DME       PT Treatment Interventions DME instruction;Gait training;Functional mobility training;Therapeutic activities;Patient/family education;Therapeutic exercise    PT Goals (Current goals can be found in the Care Plan section)  Acute Rehab PT Goals PT Goal Formulation: With  patient Time For Goal Achievement: 01/30/19 Potential to Achieve Goals: Good    Frequency Min 3X/week   Barriers to discharge        Co-evaluation               AM-PAC PT "6 Clicks" Mobility  Outcome Measure Help needed turning from your back to your side while in a flat bed without using bedrails?: None Help needed moving from lying on your back to sitting on the side of a flat bed without using bedrails?: None Help needed moving to and from a bed to a chair (including a wheelchair)?: A Little Help needed standing up from a chair using your arms (e.g., wheelchair or bedside chair)?: A Little Help needed to walk in hospital room?: A Little Help needed climbing 3-5 steps with a railing? : A Little 6 Click Score: 20    End of Session   Activity Tolerance: Patient tolerated treatment well Patient left: in bed;with call bell/phone within reach;with bed alarm set Nurse Communication: Mobility status PT Visit Diagnosis: Difficulty in walking, not elsewhere classified (R26.2)    Time: [id number] PT Time Calculation (min) (ACUTE ONLY): 17 min   Charges:   PT Evaluation $PT Eval Low Complexity: 1 Low          Kenyon Ana, PT  Pager: [phone] Acute Rehab Dept [facility]): [phone]   01/16/2019   [facility] 01/16/2019, 4:02 PM

## 2019-01-16 NOTE — Care Management Important Message (Signed)
Important Message  Patient Details IM Letter given to Dessa Phi RN Case Manager to present to the Patient Name: Karen Rasmussen MRN: [id number] Date of Birth: [birth year]   Medicare Important Message Given:  Yes     Kerin Salen 01/16/2019, 10:38 AM

## 2019-01-16 NOTE — Evaluation (Signed)
Clinical/Bedside Swallow Evaluation Patient Details  Name: Karen Rasmussen MRN: [id number] Date of Birth: [birth year]  Today's Date: 01/16/2019 Time: SLP Start Time (ACUTE ONLY): 90 SLP Stop Time (ACUTE ONLY): 1030 SLP Time Calculation (min) (ACUTE ONLY): 24 min  Past Medical History:  Past Medical History:  Diagnosis Date  . Abdominal pain, left lower quadrant   . Asthmatic bronchitis   . Benign essential HTN 04/26/2016  . Bursitis of hip   . Chronic diastolic CHF (congestive heart failure) ([place])   . DCM (dilated cardiomyopathy) ([place])    EF 45-50%  . GERD (gastroesophageal reflux disease)   . High cholesterol   . History of kidney stones   . Hypersomnia   . Morbid obesity with BMI of 50.0-59.9, adult ([place])   . OSA on CPAP   . Osteoarthritis    "right hip; both knees" (09/21/2016)  . Persistent atrial fibrillation ([place]) 04/26/2016  . Type II diabetes mellitus ([place])   . Vitamin D deficiency disease    Past Surgical History:  Past Surgical History:  Procedure Laterality Date  . ABDOMINAL HYSTERECTOMY    . APPENDECTOMY    . BALLOON DILATION N/A 11/17/2017   Procedure: BALLOON DILATION;  Surgeon: Ronnette Juniper, MD;  Location: [facility];  Service: Gastroenterology;  Laterality: N/A;  . BIOPSY  11/17/2017   Procedure: BIOPSY;  Surgeon: Ronnette Juniper, MD;  Location: [facility] ENDOSCOPY;  Service: Gastroenterology;;  . CARDIAC CATHETERIZATION    . CARDIOVERSION N/A 06/11/2016   Procedure: CARDIOVERSION;  Surgeon: Dorothy Spark, MD;  Location: [facility] ENDOSCOPY;  Service: Cardiovascular;  Laterality: N/A;  . CARDIOVERSION N/A 07/16/2016   Procedure: CARDIOVERSION;  Surgeon: Thayer Headings, MD;  Location: [facility] ENDOSCOPY;  Service: Cardiovascular;  Laterality: N/A;  . CARDIOVERSION N/A 09/23/2016   Procedure: CARDIOVERSION;  Surgeon: Sanda Klein, MD;  Location: [facility] ENDOSCOPY;  Service: Cardiovascular;  Laterality: N/A;  . CHOLECYSTECTOMY OPEN    . COLONOSCOPY N/A 11/17/2017   Procedure:  COLONOSCOPY;  Surgeon: Ronnette Juniper, MD;  Location: [facility] ENDOSCOPY;  Service: Gastroenterology;  Laterality: N/A;  . costisone injection to left knee  11/03/2017  . CYSTOSCOPY WITH RETROGRADE PYELOGRAM, URETEROSCOPY AND STENT PLACEMENT Left 01/14/2019   Procedure: CYSTOSCOPy  STENT PLACEMENT;  Surgeon: Robley Fries, MD;  Location: [facility] ORS;  Service: Urology;  Laterality: Left;  . DILATION AND CURETTAGE OF UTERUS     S/P miscarriage  . ESOPHAGOGASTRODUODENOSCOPY N/A 11/17/2017   Procedure: ESOPHAGOGASTRODUODENOSCOPY (EGD);  Surgeon: Ronnette Juniper, MD;  Location: [facility];  Service: Gastroenterology;  Laterality: N/A;  . NASAL SEPTUM SURGERY    . POLYPECTOMY  11/17/2017   Procedure: POLYPECTOMY;  Surgeon: Ronnette Juniper, MD;  Location: [facility];  Service: Gastroenterology;;  . TONSILLECTOMY     HPI:  73 yo female admitted to hospital with hypersmolarglymecic state - She is s/p cystoscopywith stent placement for ureteral obstruction.  Pt is s/p stenting.  PMH + for CHF, GERD, OSA.  She also has h/o dysphagia hiatal hernia, Schatzki's ring s/p dilatation 11/2017.  She reports increasing problems with swalloiwng causing her to sense lodging distally in esophagua and pain as well as backflowing at times.  She reports symptoms are consistent with findings as prior to endoscopy procedure/dilatatoin 11/2017.  MD ordered SlP for evaluation.   Assessment / Plan / Recommendation Clinical Impression  Pt with no indications of oropharyngeal dysphagia and CN exam is unremarkable.  Articulation is clear with strong voice.  She also has h/o dysphagia hiatal hernia, Schatzki's ring s/p dilatation  11/2017.  She reports increasing problems with swalloiwng causing her to sense lodging distally in esophagua and pain as well as backflowing at times.  She reports symptoms are consistent with findings as prior to endoscopy procedure/dilatation 2019.    SLP provided pt with verbal, and written compensation strategies  to mitigate her dysphagia pending bariusm swallow completion as an OP.  Advised small frequent meals, masticating thoroughly and assuring stay hydrated.  Pt reports she will eat one meal a day often - SLP advised against and recommended she stay upright after meals.  Further advised she take her Fosomax with plenty of liquids to assure clears due to her h/o Schatzki's ring.  Recommend to continue diet as tolerated due to pt's awareness of her dysphagia and education regarding compensation strategies completed.   SLP Visit Diagnosis: Dysphagia, unspecified (R13.10)    Aspiration Risk  Mild aspiration risk    Diet Recommendation (diet as tolerated as pt aware of dysphagia and educated to compensations)   Liquid Administration via: Cup Medication Administration: (as tolerated) Compensations: Slow rate;Small sips/bites(small frequent meals) Postural Changes: Seated upright at 90 degrees;Remain upright for at least 30 minutes after po intake    Other  Recommendations     Follow up Recommendations        Frequency and Duration            Prognosis        Swallow Study   General Date of Onset: 01/16/19 HPI: 73 yo female admitted to hospital with hypersmolarglymecic state - She is s/p cystoscopywith stent placement for ureteral obstruction.  Pt is s/p stenting.  PMH + for CHF, GERD, OSA.  She also has h/o dysphagia hiatal hernia, Schatzki's ring s/p dilatation 11/2017.  She reports increasing problems with swalloiwng causing her to sense lodging distally in esophagua and pain as well as backflowing at times.  She reports symptoms are consistent with findings as prior to endoscopy procedure/dilatatoin 11/2017.  MD ordered SlP for evaluation. Type of Study: Bedside Swallow Evaluation Diet Prior to this Study: Regular Temperature Spikes Noted: No Respiratory Status: Nasal cannula History of Recent Intubation: No Behavior/Cognition: Alert;Cooperative;Pleasant mood Oral Cavity Assessment:  Within Functional Limits Oral Care Completed by SLP: No Oral Cavity - Dentition: Adequate natural dentition Vision: Functional for self-feeding Self-Feeding Abilities: Able to feed self Patient Positioning: Upright in bed Baseline Vocal Quality: Normal Volitional Cough: Strong Volitional Swallow: Able to elicit    Oral/Motor/Sensory Function     Ice Chips Ice chips: Not tested   Thin Liquid Thin Liquid: Within functional limits Presentation: Straw;Self Fed Other Comments: did not conduct 3 ounce Yale test due to pt's report of dysphagia symptoms/backflow and "choking sensation" if consume liquids too rapidly =  likley 2ndeffects from esophageal deficits    Nectar Thick Nectar Thick Liquid: Not tested   Honey Thick Honey Thick Liquid: Not tested   Puree Puree: Not tested   Solid     Solid: Not tested      Macario Golds 01/16/2019,10:46 AM    Luanna Salk, MS [place] Pager [phone] Office [phone]

## 2019-01-16 NOTE — Plan of Care (Signed)

## 2019-01-16 NOTE — Progress Notes (Signed)
PROGRESS NOTE    Karen Rasmussen  [id number] DOB: 01/02/1946 DOA: 01/12/2019 PCP: Kathyrn Lass, MD   Brief Narrative:  Patient is a morbidly obese Caucasain female with past medical history significant for but not limited to benign essential hypertension, chronic diastolic CHF and dilated cardiomyopathy, GERD, hyperlipidemia, history of nephrolithiasis, morbid obesity, OSA on CPAP, persistent atrial fibrillation, type 2 diabetes mellitus, vitamin D deficiency and other comorbidities who presents with a chief complaint of weakness which is global, dry cough, night sweats and increased sugars.  She reports that she is also had diarrhea recently. She states that symptoms have been going on for a little while now for at least 1 to 2 weeks and she has been more fatigued.   Of note she also had some flank pain and was worked up and found to have urinary tract infection.  A CT scan of the abdomen pelvis was done which showed an obstructing left ureteral calculus.  Urology was consulted and took the patient to the OR for stent placement and definitive laser lithotripsy eventually in the outpatient setting.  Currently she is on antibiotics with IV ceftriaxone which we will continue for now.  She is postoperative day 2 stenting and renal function is improving but patient continues to be significantly weak so PT and OT will be consulted for further evaluation.  She is also having some dysphagia so SLP evaluation has also been ordered and they recommended techniques given her Schatzki's ring.  Urine culture grew out ESBL E. coli so antibiotics were escalated to IV meropenem.  Patient was too fatigued to work with therapy today and will try again later.  Assessment & Plan:   Active Problems:   Abdominal pain, left lower quadrant   OSA (obstructive sleep apnea)   GERD (gastroesophageal reflux disease)   Morbid obesity with BMI of 40.0-44.9, adult (HCC)   Chronic diastolic CHF (congestive heart failure) (HCC)   Benign essential HTN   Persistent atrial fibrillation (HCC)   DCM (dilated cardiomyopathy) (HCC)   Hypokalemia   Hyperosmolar hyperglycemic state (HHS) ([place])   Acute renal injury ([place])   Urinary tract infection without hematuria   Elevated troponin   Left nephrolithiasis  ESBL E. coli UTI in the setting of obstructing Left Ureteral Nephrolithiasis s/p Left Ureteral Stenting -WBC went from 14.6 -> 19.4 -> 12.8 -> 10.8 -> 13.1 -Urinalysis showed a hazy appearance with greater than 500 glucose, large hemoglobin, 5 ketones, large leukocytes, no bacteria seen, greater than 50 RBCs per high-power field, and greater than 50 WBCs -Patient is complaining of low back pain more so on the left side compared to the right -Blood cultures x2 were obtained however unfortunately urine cultures not obtained until after she was started on antibiotics; urine culture showed 10,000 colonies forming units of ESBL E. coli -Received IV Ceftriaxone and have escalated antibiotics to IV meropenem and will likely discharge with p.o. ciprofloxacin at discharge -IVF now stopped  -Check CT of the abdomen pelvis without contrast and showed an obstructing calculus at the left uteropelvic junction and mild left hydronephrosis and renal edema along with left nephrolithiasis and left colon diverticulosis without evidence of diverticulitis -Urine culture showed 10,000 colonies of E. Coli with Sensitivities pending  -Urology was consulted for further evaluation recommendations and Dr. Claudia Desanctis recommended cystoscopy with left ureteral stent placement with eventual staged ureteroscopic laser lithotripsy for definitive stone management -Continue with IV antibiotics and further care per Urology; I spoke with Urology in the patient currently does  not need a Foley catheter at this time -Antibiotics have been escalated and she will have at least 7 to 10 days of treatment since she is now on appropriate antibiotic therapy  Persistent Atrial  Fibrillation currently in NSR -Continue with Carvedilol and Sotalol -Continue with Rivaroxaban; received subcu Lovenox on admission -Cardiology was consulted for further evaluation recommendations given her Elevated Troponin by Admitting Physician   Uncontrolled Diabetes Mellitus Type 2  -Hemoglobin A1c was 8.7 -Takes Metformin and glimepiride at home which have both been held  -In the setting of infection -Patient blood sugar on admission was 465 and repeat was 451 and CBGs ranging from 339-424; Now CBG's ranging from 172-293 -Continue with moderate NovoLog/scale insulin and was given insulin aspart 6 units 3 times daily with meals and also given Lantus 10 units subcu nightly; Lantus was increased to 30 units now daily and NovoLog is now 8 units 3 times daily with meals for insulin coverage and she will be continued on moderate NovoLog/scale insulin AC -Consulted diabetes education coordinator consulted for further evaluation recommendations  Dilated Cardiomyopathy and history of Diastolic CHF with an EF of 45 to 50% -Continue with carvedilol and hold spironolactone for now -Strict I's and O's and daily weights  -BNP was 242.1 on admission and troponin was initially elevated at 48 and trending down to 33 -Cardiology was consulted for further evaluation recommendations they suspect that her troponin elevation in the context of her infection  -Patient's Spironolactone was resumed now -Continue to monitor for signs symptoms of volume overload -Repeat chest x-ray in the a.m.  AKI on CKD Stage 3b Metabolic Acidosis -In the setting of Infection and Dehydration -Patient's BUN/creatinine went from 32/1.81 and is now 21/1.26 -> 23/1.02 -Initially held Spironolactone but resume today -Patient's CO2 is 19, chloride level 102, and anion gap was 12; Now patient's CO2 is 23, anion gap is 10, and chloride levels 106 -Given IVF with NS but now stopped -Avoid nephrotoxic medications, contrast dyes,  hypotension if possible and renally adjust medications  -Continue monitor trend renal function n -Repeat CMP in the a.m.  Generalized Weakness likely in the setting of infection rule out other etiologies -Get PT/OT evaluation and still pending and patient was too fatigued to even work with him today -Of note SARS-CoV-2 testing was negative -Gentle IVF Hydration with normal saline at a rate of 75 mL's per hour for another 12 more hours and stopped now -Continue to treat infection and follow clinical response  HLD -C/w SimvaStatin 10 mg po qHS  Hyponatremia -Mild on admission and trending upwards  -Patient's sodium went from 131 and is now improved to 139 -IVF now stopped -Continue to monitor and trend and repeat CMP in a.m.  OSA -Will order CPAP  Morbid Obesity -Estimated body mass index is 49.75 kg/m as calculated from the following:   Height as of this encounter: 5\' 2"  (1.575 m).   Weight as of this encounter: 123.4 kg. -Weight Loss and Dietary Counseling given   Hypophosphatemia -Patient's phosphorus level was 3.0 -Continue to Monitor and Replete as Necessary -Repeat Phos Level in AM   Normocytic Anemia -Patient's Hgb/Hct went from 10.4/34.4 -> 10.3/35.0 -> 10.6/35.3 -Checked Anemia Panel and showed an iron level of 57, U IBC 272, TIBC of 329, saturation ratios of 17%, ferritin level 226, folate level 9.6, vitamin B12 level 924 -In the Setting of Chronic Kidney Diseased and Dilutional Drop -Continue to Monitor for S/Sx of Bleeding as she is on Anticoagulation with Xarelto;  Currently no overt bleeding noted.  -Repeat CBC in AM   Dysphagia -Check SLP Evaluation; Was to have a MBS in the outpatient setting -SLP done and she has a history of dysphagia with a hiatal hernia and Schatzki's ring status post dilatation October 2018 -SLP recommends continuing diet as tolerated given patient's awareness of her dysphagia and education was provided regarding her compensation  strategies by the SLP -If worsening will consider GI consultation for dilatation of Schatzki's ring  DVT prophylaxis: Anticoagulated with Xarelto  Code Status: FULL CODE  Family Communication: No family present at bedside  Disposition Plan: Pending further workup and treatment and anticipating discharge to his skilled nursing facility next 2 to 3 days  Consultants:   Cardiology  Urology   Procedures:  Cystoscopy    Antimicrobials:  Anti-infectives (From admission, onward)   Start     Dose/Rate Route Frequency Ordered Stop   01/15/19 1800  meropenem (MERREM) 1 g in sodium chloride 0.9 % 100 mL IVPB     1 g 200 mL/hr over 30 Minutes Intravenous Every 8 hours 01/15/19 1752     01/14/19 1655  sodium chloride 0.9 % with cefTRIAXone (ROCEPHIN) ADS Med    Note to Pharmacy: Octavio Graves   : cabinet override      01/14/19 1655 01/15/19 0459   01/13/19 2200  cefTRIAXone (ROCEPHIN) 1 g in sodium chloride 0.9 % 100 mL IVPB  Status:  Discontinued     1 g 200 mL/hr over 30 Minutes Intravenous Daily at bedtime 01/13/19 0532 01/15/19 1752   01/13/19 0230  cefTRIAXone (ROCEPHIN) 1 g in sodium chloride 0.9 % 100 mL IVPB     1 g 200 mL/hr over 30 Minutes Intravenous  Once 01/13/19 0223 01/13/19 0537     Subjective: Seen and examined at bedside and she was sitting in the chair at bedside and states that she did not sleep very well last night.  No nausea or vomiting.  Denies any chest pain, lightheadedness or dizziness.  States that her abdominal pain is improved.  No other concerns or complaints at this time.  Objective: Vitals:   01/15/19 2005 01/15/19 2053 01/16/19 0015 01/16/19 0744  BP:  139/61    Pulse:  66 64   Resp:   16   Temp:  (!) 97.3 F (36.3 C)    TempSrc:  Oral    SpO2: 97% 97% 94% 95%  Weight:      Height:        Intake/Output Summary (Last 24 hours) at 01/16/2019 1519 Last data filed at 01/16/2019 0930 Gross per 24 hour  Intake 616.75 ml  Output 2500 ml  Net  -1883.25 ml   Filed Weights   01/13/19 0013  Weight: 123.4 kg   Examination: Physical Exam:  Constitutional: WN/WD morbidly obese Caucasian female currently in no acute distress sitting in the chair at bedside appears calm but a little drowsy Eyes: Lids and conjunctivae normal, sclerae anicteric  ENMT: External Ears, Nose appear normal. Grossly normal hearing. Neck: Appears normal, supple, no cervical masses, normal ROM, no appreciable thyromegaly; no JVD Respiratory: Diminished to auscultation bilaterally, no wheezing, rales, rhonchi or crackles. Normal respiratory effort and patient is not tachypenic. No accessory muscle use. Unlabored breathing  Cardiovascular: RRR, no murmurs / rubs / gallops. S1 and S2 auscultated. 1+ LE extremity edema. Abdomen: Soft, non-tender, Distended due to body habitus. Bowel sounds positive x4.  GU: Deferred. Musculoskeletal: No clubbing / cyanosis of digits/nails. No joint deformity upper  and lower extremities Skin: No rashes, lesions, ulcers on limited skin evaluation. No induration; Warm and dry.  Neurologic: CN 2-12 grossly intact with no focal deficits. Romberg sign and cerebellar reflexes not assessed.  Psychiatric: Normal judgment and insight. Awake and oriented x 3 but is slightly drowsy. Normal mood and appropriate affect.   Data Reviewed: I have personally reviewed following labs and imaging studies  CBC: Recent Labs  Lab 01/12/19 2259 01/13/19 0542 01/14/19 0537 01/15/19 0428 01/16/19 0528  WBC 14.6* 19.4* 12.8* 10.9* 13.1*  NEUTROABS  --   --  10.4* 9.2* 8.6*  HGB 11.8* 10.9* 10.4* 10.3* 10.6*  HCT 37.3 34.5* 34.4* 35.0* 35.3*  MCV 90.3 90.3 91.0 94.1 92.2  PLT 228 230 237 241 [id number]   Basic Metabolic Panel: Recent Labs  Lab 01/12/19 2259 01/13/19 0542 01/13/19 0948 01/14/19 0537 01/15/19 0428 01/16/19 0528  NA 131* 133*  --  136 138 139  K 4.6 4.9  --  4.5 4.3 4.1  CL 101 102  --  105 105 106  CO2 19* 19*  --  20* 22 23    GLUCOSE 465* 451*  --  203* 270* 227*  BUN 32* 30*  --  21 21 23   CREATININE 1.81* 1.49*  --  1.43* 1.26* 1.02*  CALCIUM 8.2* 8.0*  --  8.2* 8.4* 8.3*  MG  --   --  2.4 2.3 2.4 2.2  PHOS  --   --   --  2.4* 4.4 3.0   GFR: Estimated Creatinine Clearance: 64.3 mL/min (A) (by C-G formula based on SCr of 1.02 mg/dL (H)). Liver Function Tests: Recent Labs  Lab 01/13/19 0542 01/14/19 0537 01/15/19 0428 01/16/19 0528  AST 16 14* 14* 15  ALT 33 24 27 25   ALKPHOS 123 98 105 90  BILITOT 0.6 1.0 0.5 1.0  PROT 6.1* 6.1* 6.1* 6.0*  ALBUMIN 2.5* 2.5* 2.4* 2.5*   No results for input(s): LIPASE, AMYLASE in the last 168 hours. No results for input(s): AMMONIA in the last 168 hours. Coagulation Profile: No results for input(s): INR, PROTIME in the last 168 hours. Cardiac Enzymes: No results for input(s): CKTOTAL, CKMB, CKMBINDEX, TROPONINI in the last 168 hours. BNP (last 3 results) No results for input(s): PROBNP in the last 8760 hours. HbA1C: No results for input(s): HGBA1C in the last 72 hours. CBG: Recent Labs  Lab 01/15/19 1151 01/15/19 1632 01/15/19 2057 01/16/19 0753 01/16/19 1108  GLUCAP 237* 293* 263* 172* 201*   Lipid Profile: No results for input(s): CHOL, HDL, LDLCALC, TRIG, CHOLHDL, LDLDIRECT in the last 72 hours. Thyroid Function Tests: No results for input(s): TSH, T4TOTAL, FREET4, T3FREE, THYROIDAB in the last 72 hours. Anemia Panel: Recent Labs    01/16/19 0528  VITAMINB12 924*  FOLATE 9.6  FERRITIN 226  TIBC 329  IRON 57  RETICCTPCT 1.9   Sepsis Labs: No results for input(s): PROCALCITON, LATICACIDVEN in the last 168 hours.  Recent Results (from the past 240 hour(s))  SARS CORONAVIRUS 2 (TAT 6-24 HRS) Nasopharyngeal Nasopharyngeal Swab     Status: None   Collection Time: 01/13/19 12:24 AM   Specimen: Nasopharyngeal Swab  Result Value Ref Range Status   SARS Coronavirus 2 NEGATIVE NEGATIVE Final    Comment: (NOTE) SARS-CoV-2 target nucleic acids  are NOT DETECTED. The SARS-CoV-2 RNA is generally detectable in upper and lower respiratory specimens during the acute phase of infection. Negative results do not preclude SARS-CoV-2 infection, do not rule out co-infections with other pathogens,  and should not be used as the sole basis for treatment or other patient management decisions. Negative results must be combined with clinical observations, patient history, and epidemiological information. The expected result is Negative. Fact Sheet for Patients: SugarRoll.be Fact Sheet for Healthcare Providers: https://www.woods-mathews.com/ This test is not yet approved or cleared by the [place] FDA and  has been authorized for detection and/or diagnosis of SARS-CoV-2 by FDA under an Emergency Use Authorization (EUA). This EUA will remain  in effect (meaning this test can be used) for the duration of the COVID-19 declaration under Section 56 4(b)(1) of the Act, 21 U.S.C. section 360bbb-3(b)(1), unless the authorization is terminated or revoked sooner. Performed at [facility], [place] [address] [place], [place] [address]   Blood culture (routine x 2)     Status: None (Preliminary result)   Collection Time: 01/13/19 12:24 AM   Specimen: BLOOD  Result Value Ref Range Status   Specimen Description   Final    BLOOD RIGHT ANTECUBITAL Performed at [place] [address], [place], [place] [address]    Special Requests   Final    BOTTLES DRAWN AEROBIC AND ANAEROBIC Blood Culture adequate volume Performed at [facility] [address] [place], [place] [address]    Culture   Final    NO GROWTH 3 DAYS Performed at [facility], [place] [address], [place], [place] [address]    Report Status PENDING  Incomplete  Blood culture (routine x 2)     Status: None (Preliminary result)   Collection Time: 01/13/19  2:23 AM   Specimen: BLOOD  Result  Value Ref Range Status   Specimen Description   Final    BLOOD LEFT ANTECUBITAL Performed at [place] [address] [place], [place] [address]    Special Requests   Final    BOTTLES DRAWN AEROBIC AND ANAEROBIC Blood Culture adequate volume Performed at [place] [address], [place], [place] [address]    Culture   Final    NO GROWTH 3 DAYS Performed at [facility], [place] [address] [place], [place] [address]    Report Status PENDING  Incomplete  Culture, Urine     Status: Abnormal   Collection Time: 01/13/19  8:04 AM   Specimen: Urine, Clean Catch  Result Value Ref Range Status   Specimen Description   Final    URINE, CLEAN CATCH Performed at [facility], [place] [address], [place], [place] [address]    Special Requests   Final    NONE Performed at [facility], [place] [address], [place], [place] [address]    Culture (A)  Final    10,000 COLONIES/mL ESCHERICHIA COLI Confirmed Extended Spectrum Beta-Lactamase Producer (ESBL).  In bloodstream infections from ESBL organisms, carbapenems are preferred over piperacillin/tazobactam. They are shown to have a lower risk of mortality.    Report Status 01/15/2019 FINAL  Final   Organism ID, Bacteria ESCHERICHIA COLI (A)  Final      Susceptibility   Escherichia coli - MIC*    AMPICILLIN >=32 RESISTANT Resistant     CEFAZOLIN >=64 RESISTANT Resistant     CEFTRIAXONE RESISTANT Resistant     CIPROFLOXACIN <=0.25 SENSITIVE Sensitive     GENTAMICIN <=1 SENSITIVE Sensitive     IMIPENEM 0.5 SENSITIVE Sensitive     NITROFURANTOIN 64 INTERMEDIATE Intermediate     TRIMETH/SULFA <=20 SENSITIVE Sensitive  AMPICILLIN/SULBACTAM >=32 RESISTANT Resistant     Extended ESBL POSITIVE Resistant     * 10,000 COLONIES/mL ESCHERICHIA COLI  MRSA PCR Screening     Status: None   Collection Time: 01/14/19  5:10 PM   Specimen: Nasal Mucosa; Nasopharyngeal  Result  Value Ref Range Status   MRSA by PCR NEGATIVE NEGATIVE Final    Comment:        The GeneXpert MRSA Assay (FDA approved for NASAL specimens only), is one component of a comprehensive MRSA colonization surveillance program. It is not intended to diagnose MRSA infection nor to guide or monitor treatment for MRSA infections. Performed at [facility], [place] [address], [place], [place] [address]     Radiology Studies: Dg Chest Port 1 View  Result Date: 01/15/2019 CLINICAL DATA:  Shortness of breath EXAM: PORTABLE CHEST 1 VIEW COMPARISON:  01/12/2019 FINDINGS: Lungs are clear.  No pleural effusion or pneumothorax. The heart is mildly enlarged. IMPRESSION: No evidence of acute cardiopulmonary disease. Electronically Signed   By: Julian Hy M.D.   On: 01/15/2019 19:10   Dg C-arm 1-60 Min-no Report  Result Date: 01/14/2019 Fluoroscopy was utilized by the requesting physician.  No radiographic interpretation.   Scheduled Meds:  aspirin EC  81 mg Oral Daily   carvedilol  3.125 mg Oral BID WC   cholecalciferol  5,000 Units Oral Daily   folic acid  1 mg Oral Daily   insulin aspart  0-15 Units Subcutaneous TID WC   insulin aspart  8 Units Subcutaneous TID WC   insulin glargine  30 Units Subcutaneous Daily   ipratropium-albuterol  3 mL Nebulization BID   multivitamin with minerals  1 tablet Oral Daily   pantoprazole  40 mg Oral Daily   psyllium  1 packet Oral BID   rivaroxaban  20 mg Oral QPM   simvastatin  10 mg Oral QHS   sotalol  80 mg Oral BID   spironolactone  12.5 mg Oral Daily   tamsulosin  0.4 mg Oral Daily   Continuous Infusions:  meropenem (MERREM) IV 1 g (01/16/19 1116)    LOS: 3 days   Kerney Elbe, DO [facility] PAGER is on AMION  If 7PM-7AM, please contact night-coverage [email or web address]

## 2019-01-17 DIAGNOSIS — I5032 Chronic diastolic (congestive) heart failure: Secondary | ICD-10-CM

## 2019-01-17 DIAGNOSIS — N179 Acute kidney failure, unspecified: Secondary | ICD-10-CM

## 2019-01-17 DIAGNOSIS — Z1612 Extended spectrum beta lactamase (ESBL) resistance: Secondary | ICD-10-CM

## 2019-01-17 DIAGNOSIS — G4733 Obstructive sleep apnea (adult) (pediatric): Secondary | ICD-10-CM

## 2019-01-17 DIAGNOSIS — N10 Acute tubulo-interstitial nephritis: Secondary | ICD-10-CM | POA: Diagnosis present

## 2019-01-17 DIAGNOSIS — N200 Calculus of kidney: Secondary | ICD-10-CM

## 2019-01-17 DIAGNOSIS — E876 Hypokalemia: Secondary | ICD-10-CM

## 2019-01-17 DIAGNOSIS — K219 Gastro-esophageal reflux disease without esophagitis: Secondary | ICD-10-CM

## 2019-01-17 DIAGNOSIS — I420 Dilated cardiomyopathy: Secondary | ICD-10-CM

## 2019-01-17 DIAGNOSIS — N390 Urinary tract infection, site not specified: Secondary | ICD-10-CM

## 2019-01-17 DIAGNOSIS — B9629 Other Escherichia coli [E. coli] as the cause of diseases classified elsewhere: Secondary | ICD-10-CM | POA: Diagnosis present

## 2019-01-17 DIAGNOSIS — Z6841 Body Mass Index (BMI) 40.0 and over, adult: Secondary | ICD-10-CM

## 2019-01-17 DIAGNOSIS — I4819 Other persistent atrial fibrillation: Secondary | ICD-10-CM

## 2019-01-17 LAB — CBC WITH DIFFERENTIAL/PLATELET
Abs Immature Granulocytes: 0.83 10*3/uL — ABNORMAL HIGH (ref 0.00–0.07)
Basophils Absolute: 0 10*3/uL (ref 0.0–0.1)
Basophils Relative: 0 %
Eosinophils Absolute: 0.2 10*3/uL (ref 0.0–0.5)
Eosinophils Relative: 2 %
HCT: 38.3 % (ref 36.0–46.0)
Hemoglobin: 11.4 g/dL — ABNORMAL LOW (ref 12.0–15.0)
Immature Granulocytes: 9 %
Lymphocytes Relative: 23 %
Lymphs Abs: 2.2 10*3/uL (ref 0.7–4.0)
MCH: 27.7 pg (ref 26.0–34.0)
MCHC: 29.8 g/dL — ABNORMAL LOW (ref 30.0–36.0)
MCV: 93.2 fL (ref 80.0–100.0)
Monocytes Absolute: 1.1 10*3/uL — ABNORMAL HIGH (ref 0.1–1.0)
Monocytes Relative: 11 %
Neutro Abs: 5.3 10*3/uL (ref 1.7–7.7)
Neutrophils Relative %: 55 %
Platelets: 343 10*3/uL (ref 150–400)
RBC: 4.11 MIL/uL (ref 3.87–5.11)
RDW: 14.1 % (ref 11.5–15.5)
WBC: 9.7 10*3/uL (ref 4.0–10.5)
nRBC: 0 % (ref 0.0–0.2)

## 2019-01-17 LAB — MAGNESIUM: Magnesium: 1.9 mg/dL (ref 1.7–2.4)

## 2019-01-17 LAB — COMPREHENSIVE METABOLIC PANEL
ALT: 26 U/L (ref 0–44)
AST: 16 U/L (ref 15–41)
Albumin: 2.3 g/dL — ABNORMAL LOW (ref 3.5–5.0)
Alkaline Phosphatase: 81 U/L (ref 38–126)
Anion gap: 9 (ref 5–15)
BUN: 21 mg/dL (ref 8–23)
CO2: 26 mmol/L (ref 22–32)
Calcium: 9 mg/dL (ref 8.9–10.3)
Chloride: 106 mmol/L (ref 98–111)
Creatinine, Ser: 0.91 mg/dL (ref 0.44–1.00)
GFR calc Af Amer: >60 mL/min (ref >60)
GFR calc non Af Amer: >60 mL/min (ref >60)
Glucose, Bld: 175 mg/dL — ABNORMAL HIGH (ref 70–99)
Potassium: 3.9 mmol/L (ref 3.5–5.1)
Sodium: 141 mmol/L (ref 135–145)
Total Bilirubin: 0.5 mg/dL (ref 0.3–1.2)
Total Protein: 5.6 g/dL — ABNORMAL LOW (ref 6.5–8.1)

## 2019-01-17 LAB — GLUCOSE, CAPILLARY
Glucose-Capillary: 162 mg/dL — ABNORMAL HIGH (ref 70–99)
Glucose-Capillary: 147 mg/dL — ABNORMAL HIGH (ref 70–99)
Glucose-Capillary: 181 mg/dL — ABNORMAL HIGH (ref 70–99)
Glucose-Capillary: 373 mg/dL — ABNORMAL HIGH (ref 70–99)
Glucose-Capillary: 366 mg/dL — ABNORMAL HIGH (ref 70–99)

## 2019-01-17 LAB — PHOSPHORUS: Phosphorus: 4.5 mg/dL (ref 2.5–4.6)

## 2019-01-17 MED ORDER — INSULIN ASPART 100 UNIT/ML ~~LOC~~ SOLN
0.0000 [IU] | Freq: Three times a day (TID) | SUBCUTANEOUS | Status: DC
  Administered 2019-01-17: 15 [IU] via SUBCUTANEOUS
  Administered 2019-01-18 (×2): 5 [IU] via SUBCUTANEOUS
  Administered 2019-01-18: 3 [IU] via SUBCUTANEOUS
  Administered 2019-01-18 – 2019-01-19 (×2): 5 [IU] via SUBCUTANEOUS
  Administered 2019-01-19 – 2019-01-20 (×2): 8 [IU] via SUBCUTANEOUS

## 2019-01-17 MED ORDER — IPRATROPIUM-ALBUTEROL 0.5-2.5 (3) MG/3ML IN SOLN
3.0000 mL | Freq: Three times a day (TID) | RESPIRATORY_TRACT | Status: DC
  Administered 2019-01-17 – 2019-01-20 (×9): 3 mL via RESPIRATORY_TRACT
  Filled 2019-01-17 (×9): qty 3

## 2019-01-17 MED ORDER — METHYLPREDNISOLONE SODIUM SUCC 125 MG IJ SOLR
60.0000 mg | Freq: Once | INTRAMUSCULAR | Status: AC
  Administered 2019-01-17: 60 mg via INTRAVENOUS
  Filled 2019-01-17: qty 2

## 2019-01-17 NOTE — Plan of Care (Signed)
Continue current POC 

## 2019-01-17 NOTE — Progress Notes (Signed)
PROGRESS NOTE    Karen Rasmussen  [id number] DOB: Sep 30, 1945 DOA: 01/12/2019 PCP: Kathyrn Lass, MD    Brief Narrative:  Patient is a morbidly obese Caucasain female with past medical history significant for but not limited tobenign essential hypertension, chronic diastolic CHF and dilated cardiomyopathy, GERD, hyperlipidemia, history of nephrolithiasis, morbid obesity, OSA on CPAP, persistent atrial fibrillation, type 2 diabetes mellitus, vitamin D deficiency and other comorbidities who presents with a chief complaint of weakness which is global, dry cough, night sweats and increased sugars. She reports that she is also had diarrhea recently. She states that symptoms have been going on for a little while now for at least 1 to 2 weeks and she has been more fatigued.  Of note she also had some flank pain and was worked up and found to have urinary tract infection.  A CT scan of the abdomen pelvis was done which showed an obstructing left ureteral calculus.  Urology was consulted and took the patient to the OR for stent placement and definitive laser lithotripsy eventually in the outpatient setting.  Currently she is on antibiotics with IV ceftriaxone which we will continue for now.  She is postoperative day 3 stenting and renal function is improving but patient continues to be significantly weak so PT and OT will be consulted for further evaluation.  She is also having some dysphagia so SLP evaluation has also been ordered and they recommended techniques given her Schatzki's ring.  Urine culture grew out ESBL E. coli so antibiotics were escalated to IV meropenem.     Assessment & Plan:   Principal Problem:   Acute pyelonephritis Active Problems:   UTI due to extended-spectrum beta lactamase (ESBL) producing Escherichia coli   Abdominal pain, left lower quadrant   OSA (obstructive sleep apnea)   GERD (gastroesophageal reflux disease)   Morbid obesity with BMI of 40.0-44.9, adult (HCC)  Chronic diastolic CHF (congestive heart failure) (HCC)   Benign essential HTN   Persistent atrial fibrillation (HCC)   DCM (dilated cardiomyopathy) (HCC)   Hypokalemia   Hyperosmolar hyperglycemic state (HHS) (HCC)   Acute renal injury ([place])   Urinary tract infection without hematuria   Elevated troponin   Left nephrolithiasis  1 acute pyelonephritis secondary to ESBL E. coli UTI in the setting of obstructing left ureteral nephrolithiasis status post left ureteral stenting Patient had presented with abdominal pain, noted to have left flank tenderness.  Urinalysis consistent with UTI.  Patient noted to have a leukocytosis.  Blood cultures ordered with no growth to date.  Urine cultures which were done with 10,000 colonies of of ESBL E. coli however cultures were not obtained until antibiotics were started.  Patient was initially on IV Rocephin and antibiotics coverage broadened to IV meropenem.  Will likely need empiric antibiotic treatment for about 7 to 10 days.  CT abdomen and pelvis which was done on admission that showed obstructing calculus at the left ureteral pelvic junction with mild left hydronephrosis as well as renal edema along the left nephrolithiasis and left colon diverticulosis without evidence of diverticulitis.  Urology consulted and patient underwent cystoscopy with left ureteral stent placement with eventual staging uteroscopic laser lithotripsy for definite stone management in approximately 3 weeks per urology.  Patient slowly improving clinically however still with significant weakness.  Renal function improved.  Continue IV antibiotics and follow.  2.  Hypokalemia Repleted.  3.  Persistent atrial fibrillation Currently normal sinus rhythm.  Continue Coreg and sotalol.  Xarelto for anticoagulation.  Outpatient follow-up with cardiology.  4.  Elevated troponin Felt secondary to a demand ischemia from problem #1.  Patient seen in consultation by cardiology.  No further  cardiac work-up needed at this time.  5.  Uncontrolled diabetes mellitus type 2 Hemoglobin A1c 8.7.  Oral hypoglycemic agents on hold.  On admission patient noted to have a elevated blood glucose level of 465.  CBG of 162 this morning.  Patient currently on Lantus which has been increased to 30 units daily in addition to meal coverage NovoLog 8 units 3 times a day as well as sliding scale insulin.  Diabetes coordinator following and appreciate input and recommendations.  6.  Dilated cardiomyopathy/history of chronic diastolic CHF Currently stable.  Continue cardiac regimen of carvedilol and spironolactone.  Spironolactone was initially held on admission and have subsequently been resumed.  Monitor for signs of volume overload.  Strict I's and O's.  Daily weights.  7.  Wheezing Noted on examination.  Change duo nebs to 3 times daily.  We will give a dose of Solu-Medrol 60 mg IV x1.  Patient does not appear significantly volume overloaded on examination.  Follow.  8.  Acute kidney injury on chronic kidney disease stage IIIb/metabolic acidosis Improved.  Spironolactone initially held but has been resumed.  IV fluids discontinued.  Avoid nephrotoxic agents.  Follow.  9.  Generalized weakness Likely secondary to acute infection.  Patient seen by PT OT.  Continue empiric treatment for acute pyelonephritis.  Follow.  10.  Hyperlipidemia Continue statin.  11.  Hyponatremia Improved.  12.  Hypophosphatemia Repleted.  13.  Dysphagia Patient has been seen by speech therapy.  Patient noted to have a history of dysphagia with high Isharani/Keates ring status post dilatation October 2018.  Speech therapy recommending continue current diet as tolerated given patient's awareness for her dysphagia and education regarding compensation strategies.  Outpatient follow-up with GI.  14.  OSA CPAP nightly.  Patient states will have her family bring in her nasal pillows.  15.  GERD PPI.  16.  Morbid  obesity   DVT prophylaxis: Xarelto Code Status: Full Family Communication: Updated patient.  No family at bedside. Disposition Plan: Likely home when clinically improved.   Consultants:   Urology: Dr. Claudia Desanctis 01/14/2019  Cardiology: Dr. Caryl Comes 01/13/2019  Procedures:   CT abdomen and pelvis 01/13/2019  Chest x-ray 01/12/2019, 01/15/2019  VQ scan 01/13/2019  Cystoscopy with left ureteral stent placement per Dr. Claudia Desanctis urology 01/14/2019  Antimicrobials:   IV Rocephin 01/13/2019>>>> 01/15/2019  IV Merrem 01/15/2019   Subjective: Patient states some improvement with left flank pain.  Patient states dysuria improvement.  Denies any chest pain.  Does not feel too well today.  Objective: Vitals:   01/16/19 2119 01/17/19 0448 01/17/19 0914 01/17/19 1345  BP: 106/88 136/62  123/71  Pulse: 66 66  69  Resp: 18 20  18   Temp: 98.2 F (36.8 C) 98.1 F (36.7 C)  97.8 F (36.6 C)  TempSrc: Oral Oral  Oral  SpO2: 96% 96% 95% 96%  Weight:      Height:        Intake/Output Summary (Last 24 hours) at 01/17/2019 1836 Last data filed at 01/17/2019 1500 Gross per 24 hour  Intake 200 ml  Output 1697 ml  Net -1497 ml   Filed Weights   01/13/19 0013  Weight: 123.4 kg    Examination:  General exam: Appears calm and comfortable  Respiratory system: expiratory wheezing.  No crackles.  Speaking in full sentences.  Respiratory effort normal. Cardiovascular system: S1 & S2 heard, RRR. No JVD, murmurs, rubs, gallops or clicks. No pedal edema. Gastrointestinal system: Abdomen is nondistended, soft and nontender. No organomegaly or masses felt. Normal bowel sounds heard. Central nervous system: Alert and oriented. No focal neurological deficits. Extremities: Symmetric 5 x 5 power. Skin: No rashes, lesions or ulcers Psychiatry: Judgement and insight appear normal. Mood & affect appropriate.     Data Reviewed: I have personally reviewed following labs and imaging studies  CBC: Recent Labs   Lab 01/13/19 0542 01/14/19 0537 01/15/19 0428 01/16/19 0528 01/17/19 0522  WBC 19.4* 12.8* 10.9* 13.1* 9.7  NEUTROABS  --  10.4* 9.2* 8.6* 5.3  HGB 10.9* 10.4* 10.3* 10.6* 11.4*  HCT 34.5* 34.4* 35.0* 35.3* 38.3  MCV 90.3 91.0 94.1 92.2 93.2  PLT 230 237 241 330 [id number]   Basic Metabolic Panel: Recent Labs  Lab 01/13/19 0542 01/13/19 0948 01/14/19 0537 01/15/19 0428 01/16/19 0528 01/17/19 0522  NA 133*  --  136 138 139 141  K 4.9  --  4.5 4.3 4.1 3.9  CL 102  --  105 105 106 106  CO2 19*  --  20* 22 23 26   GLUCOSE 451*  --  203* 270* 227* 175*  BUN 30*  --  21 21 23 21   CREATININE 1.49*  --  1.43* 1.26* 1.02* 0.91  CALCIUM 8.0*  --  8.2* 8.4* 8.3* 9.0  MG  --  2.4 2.3 2.4 2.2 1.9  PHOS  --   --  2.4* 4.4 3.0 4.5   GFR: Estimated Creatinine Clearance: 72.1 mL/min (by C-G formula based on SCr of 0.91 mg/dL). Liver Function Tests: Recent Labs  Lab 01/13/19 0542 01/14/19 0537 01/15/19 0428 01/16/19 0528 01/17/19 0522  AST 16 14* 14* 15 16  ALT 33 24 27 25 26   ALKPHOS 123 98 105 90 81  BILITOT 0.6 1.0 0.5 1.0 0.5  PROT 6.1* 6.1* 6.1* 6.0* 5.6*  ALBUMIN 2.5* 2.5* 2.4* 2.5* 2.3*   No results for input(s): LIPASE, AMYLASE in the last 168 hours. No results for input(s): AMMONIA in the last 168 hours. Coagulation Profile: No results for input(s): INR, PROTIME in the last 168 hours. Cardiac Enzymes: No results for input(s): CKTOTAL, CKMB, CKMBINDEX, TROPONINI in the last 168 hours. BNP (last 3 results) No results for input(s): PROBNP in the last 8760 hours. HbA1C: No results for input(s): HGBA1C in the last 72 hours. CBG: Recent Labs  Lab 01/16/19 1708 01/16/19 2118 01/17/19 0749 01/17/19 1223 01/17/19 1621  GLUCAP 175* 175* 162* 147* 181*   Lipid Profile: No results for input(s): CHOL, HDL, LDLCALC, TRIG, CHOLHDL, LDLDIRECT in the last 72 hours. Thyroid Function Tests: No results for input(s): TSH, T4TOTAL, FREET4, T3FREE, THYROIDAB in the last 72 hours.  Anemia Panel: Recent Labs    01/16/19 0528  VITAMINB12 924*  FOLATE 9.6  FERRITIN 226  TIBC 329  IRON 57  RETICCTPCT 1.9   Sepsis Labs: No results for input(s): PROCALCITON, LATICACIDVEN in the last 168 hours.  Recent Results (from the past 240 hour(s))  SARS CORONAVIRUS 2 (TAT 6-24 HRS) Nasopharyngeal Nasopharyngeal Swab     Status: None   Collection Time: 01/13/19 12:24 AM   Specimen: Nasopharyngeal Swab  Result Value Ref Range Status   SARS Coronavirus 2 NEGATIVE NEGATIVE Final    Comment: (NOTE) SARS-CoV-2 target nucleic acids are NOT DETECTED. The SARS-CoV-2 RNA is generally detectable in upper and lower respiratory specimens during the acute phase of  infection. Negative results do not preclude SARS-CoV-2 infection, do not rule out co-infections with other pathogens, and should not be used as the sole basis for treatment or other patient management decisions. Negative results must be combined with clinical observations, patient history, and epidemiological information. The expected result is Negative. Fact Sheet for Patients: SugarRoll.be Fact Sheet for Healthcare Providers: https://www.woods-mathews.com/ This test is not yet approved or cleared by the [place] FDA and  has been authorized for detection and/or diagnosis of SARS-CoV-2 by FDA under an Emergency Use Authorization (EUA). This EUA will remain  in effect (meaning this test can be used) for the duration of the COVID-19 declaration under Section 56 4(b)(1) of the Act, 21 U.S.C. section 360bbb-3(b)(1), unless the authorization is terminated or revoked sooner. Performed at [facility], [place] [address] [place], [place] [address]   Blood culture (routine x 2)     Status: None (Preliminary result)   Collection Time: 01/13/19 12:24 AM   Specimen: BLOOD  Result Value Ref Range Status   Specimen Description   Final    BLOOD RIGHT ANTECUBITAL Performed at  [place] [address] [place], [place], [place] [address]    Special Requests   Final    BOTTLES DRAWN AEROBIC AND ANAEROBIC Blood Culture adequate volume Performed at [place] [address], [place], [place] [address]    Culture   Final    NO GROWTH 4 DAYS Performed at [facility], [place] [address], [place], [place] [address]    Report Status PENDING  Incomplete  Blood culture (routine x 2)     Status: None (Preliminary result)   Collection Time: 01/13/19  2:23 AM   Specimen: BLOOD  Result Value Ref Range Status   Specimen Description   Final    BLOOD LEFT ANTECUBITAL Performed at [place] [address] [place], [place] [address]    Special Requests   Final    BOTTLES DRAWN AEROBIC AND ANAEROBIC Blood Culture adequate volume Performed at [facility] [address], [place], [place] [address]    Culture   Final    NO GROWTH 4 DAYS Performed at [facility], [place] [address], [place], [place] [address]    Report Status PENDING  Incomplete  Culture, Urine     Status: Abnormal   Collection Time: 01/13/19  8:04 AM   Specimen: Urine, Clean Catch  Result Value Ref Range Status   Specimen Description   Final    URINE, CLEAN CATCH Performed at [facility], [place] [address] [place], [place] [address]    Special Requests   Final    NONE Performed at [facility], [place] [address], [place], [place] [address]    Culture (A)  Final    10,000 COLONIES/mL ESCHERICHIA COLI Confirmed Extended Spectrum Beta-Lactamase Producer (ESBL).  In bloodstream infections from ESBL organisms, carbapenems are preferred over piperacillin/tazobactam. They are shown to have a lower risk of mortality.    Report Status 01/15/2019 FINAL  Final   Organism ID, Bacteria ESCHERICHIA COLI (A)  Final      Susceptibility   Escherichia coli - MIC*    AMPICILLIN >=32 RESISTANT  Resistant     CEFAZOLIN >=64 RESISTANT Resistant     CEFTRIAXONE RESISTANT Resistant     CIPROFLOXACIN <=0.25 SENSITIVE Sensitive     GENTAMICIN <=1 SENSITIVE Sensitive     IMIPENEM 0.5 SENSITIVE Sensitive  NITROFURANTOIN 64 INTERMEDIATE Intermediate     TRIMETH/SULFA <=20 SENSITIVE Sensitive     AMPICILLIN/SULBACTAM >=32 RESISTANT Resistant     Extended ESBL POSITIVE Resistant     * 10,000 COLONIES/mL ESCHERICHIA COLI  MRSA PCR Screening     Status: None   Collection Time: 01/14/19  5:10 PM   Specimen: Nasal Mucosa; Nasopharyngeal  Result Value Ref Range Status   MRSA by PCR NEGATIVE NEGATIVE Final    Comment:        The GeneXpert MRSA Assay (FDA approved for NASAL specimens only), is one component of a comprehensive MRSA colonization surveillance program. It is not intended to diagnose MRSA infection nor to guide or monitor treatment for MRSA infections. Performed at [facility], [place] [address], [place],  [address]          Radiology Studies: Dg Chest Port 1 View  Result Date: 01/15/2019 CLINICAL DATA:  Shortness of breath EXAM: PORTABLE CHEST 1 VIEW COMPARISON:  01/12/2019 FINDINGS: Lungs are clear.  No pleural effusion or pneumothorax. The heart is mildly enlarged. IMPRESSION: No evidence of acute cardiopulmonary disease. Electronically Signed   By: Julian Hy M.D.   On: 01/15/2019 19:10        Scheduled Meds: . aspirin EC  81 mg Oral Daily  . carvedilol  3.125 mg Oral BID WC  . cholecalciferol  5,000 Units Oral Daily  . folic acid  1 mg Oral Daily  . insulin aspart  0-15 Units Subcutaneous TID WC  . insulin aspart  8 Units Subcutaneous TID WC  . insulin glargine  30 Units Subcutaneous Daily  . ipratropium-albuterol  3 mL Nebulization TID  . multivitamin with minerals  1 tablet Oral Daily  . pantoprazole  40 mg Oral Daily  . psyllium  1 packet Oral BID  . rivaroxaban  20 mg Oral QPM  . simvastatin  10 mg Oral QHS  .  sotalol  80 mg Oral BID  . spironolactone  12.5 mg Oral Daily  . tamsulosin  0.4 mg Oral Daily   Continuous Infusions: . meropenem (MERREM) IV 1 g (01/17/19 1737)     LOS: 4 days    Time spent: 40 minutes    Irine Seal, MD [facility]  If 7PM-7AM, please contact night-coverage [email or web address] 01/17/2019, 6:36 PM

## 2019-01-18 ENCOUNTER — Inpatient Hospital Stay: Payer: Self-pay

## 2019-01-18 LAB — BASIC METABOLIC PANEL
Anion gap: 9 (ref 5–15)
BUN: 20 mg/dL (ref 8–23)
CO2: 27 mmol/L (ref 22–32)
Calcium: 8.7 mg/dL — ABNORMAL LOW (ref 8.9–10.3)
Chloride: 102 mmol/L (ref 98–111)
Creatinine, Ser: 0.78 mg/dL (ref 0.44–1.00)
GFR calc Af Amer: >60 mL/min (ref >60)
GFR calc non Af Amer: >60 mL/min (ref >60)
Glucose, Bld: 248 mg/dL — ABNORMAL HIGH (ref 70–99)
Potassium: 4 mmol/L (ref 3.5–5.1)
Sodium: 138 mmol/L (ref 135–145)

## 2019-01-18 LAB — CBC WITH DIFFERENTIAL/PLATELET
Abs Immature Granulocytes: 0.62 10*3/uL — ABNORMAL HIGH (ref 0.00–0.07)
Basophils Absolute: 0 10*3/uL (ref 0.0–0.1)
Basophils Relative: 0 %
Eosinophils Absolute: 0 10*3/uL (ref 0.0–0.5)
Eosinophils Relative: 0 %
HCT: 38.8 % (ref 36.0–46.0)
Hemoglobin: 11.9 g/dL — ABNORMAL LOW (ref 12.0–15.0)
Immature Granulocytes: 5 %
Lymphocytes Relative: 12 %
Lymphs Abs: 1.5 10*3/uL (ref 0.7–4.0)
MCH: 28.1 pg (ref 26.0–34.0)
MCHC: 30.7 g/dL (ref 30.0–36.0)
MCV: 91.7 fL (ref 80.0–100.0)
Monocytes Absolute: 0.6 10*3/uL (ref 0.1–1.0)
Monocytes Relative: 5 %
Neutro Abs: 9.5 10*3/uL — ABNORMAL HIGH (ref 1.7–7.7)
Neutrophils Relative %: 78 %
Platelets: 389 10*3/uL (ref 150–400)
RBC: 4.23 MIL/uL (ref 3.87–5.11)
RDW: 13.7 % (ref 11.5–15.5)
WBC: 12.2 10*3/uL — ABNORMAL HIGH (ref 4.0–10.5)
nRBC: 0 % (ref 0.0–0.2)

## 2019-01-18 LAB — CULTURE, BLOOD (ROUTINE X 2)
Culture: NO GROWTH
Culture: NO GROWTH
Special Requests: ADEQUATE
Special Requests: ADEQUATE

## 2019-01-18 LAB — GLUCOSE, CAPILLARY
Glucose-Capillary: 225 mg/dL — ABNORMAL HIGH (ref 70–99)
Glucose-Capillary: 205 mg/dL — ABNORMAL HIGH (ref 70–99)
Glucose-Capillary: 172 mg/dL — ABNORMAL HIGH (ref 70–99)
Glucose-Capillary: 232 mg/dL — ABNORMAL HIGH (ref 70–99)

## 2019-01-18 LAB — MAGNESIUM: Magnesium: 1.9 mg/dL (ref 1.7–2.4)

## 2019-01-18 LAB — PHOSPHORUS: Phosphorus: 3.6 mg/dL (ref 2.5–4.6)

## 2019-01-18 MED ORDER — INSULIN GLARGINE 100 UNIT/ML ~~LOC~~ SOLN
34.0000 [IU] | Freq: Every day | SUBCUTANEOUS | Status: DC
  Administered 2019-01-18 – 2019-01-20 (×3): 34 [IU] via SUBCUTANEOUS
  Filled 2019-01-18 (×3): qty 0.34

## 2019-01-18 MED ORDER — INSULIN ASPART 100 UNIT/ML ~~LOC~~ SOLN
10.0000 [IU] | Freq: Three times a day (TID) | SUBCUTANEOUS | Status: DC
  Administered 2019-01-18 – 2019-01-20 (×5): 10 [IU] via SUBCUTANEOUS

## 2019-01-18 NOTE — Progress Notes (Signed)
Physical Therapy Treatment Patient Details Name: Karen Rasmussen MRN: [id number] DOB: 23-Feb-1945 Today's Date: 01/18/2019    History of Present Illness 73 yo morbidly obese Caucasain female with past medical history of benign essential hypertension, chronic diastolic CHF and dilated cardiomyopathy, GERD, hyperlipidemia, history of nephrolithiasis, morbid obesity, OSA on CPAP, persistent atrial fibrillation, type 2 diabetes mellitus, vitamin D deficiency and other comorbidities who presents with a chief complaint of weakness which is global, dry cough, night sweats and increased sugars. pt is postoperative day 2 stenting and lithotripsy, however  continues to be significantly weak    PT Comments    Pt demonstrated great motivation today. Pt was very pleasant. We were able to get up and go to the bathroom and take a walk in the hall. General bed mobility comments: HOB elevated, Pt able to sit up on EOB with supervision General transfer comment: Pt was able to stand with supervision for safety General Gait Details: Pt able to ambulate 100 ft on RW. Pt walked to the bathroom with no AD and we tried a [facility] (that she uses at home) in the room but pt stated that she felt more secure on the walker. Pt was CGA/supervision for safety.    Follow Up Recommendations  No PT follow up     Equipment Recommendations  Rolling walker with 5" wheels    Recommendations for Other Services       Precautions / Restrictions Precautions Precautions: None Restrictions Weight Bearing Restrictions: No    Mobility  Bed Mobility Overal bed mobility: Modified Independent             General bed mobility comments: HOB elevated, Pt able to sit up on EOB with supervision  Transfers Overall transfer level: Modified independent Equipment used: Rolling walker (2 wheeled) Transfers: Sit to/from Stand Sit to Stand: Supervision         General transfer comment: Pt was able to stand with supervision for  safety  Ambulation/Gait Ambulation/Gait assistance: Supervision;Min guard Gait Distance (Feet): 100 Feet Assistive device: Rolling walker (2 wheeled) Gait Pattern/deviations: Step-through pattern;Wide base of support     General Gait Details: Pt able to ambulate 100 ft on RW. Pt walked to the bathroom with no AD and we tried a [facility] (that she uses at home) but pt stated that she felt more secure on the walker. Pt was CGA/supervision for safety   Stairs             Wheelchair Mobility    Modified Rankin (Stroke Patients Only)       Balance                                            Cognition Arousal/Alertness: Awake/alert Behavior During Therapy: WFL for tasks assessed/performed Overall Cognitive Status: Within Functional Limits for tasks assessed                                        Exercises      General Comments        Pertinent Vitals/Pain Pain Assessment: No/denies pain    Home Living                      Prior Function  PT Goals (current goals can now be found in the care plan section) Progress towards PT goals: Progressing toward goals    Frequency    Min 3X/week      PT Plan Current plan remains appropriate    Co-evaluation              AM-PAC PT "6 Clicks" Mobility   Outcome Measure  Help needed turning from your back to your side while in a flat bed without using bedrails?: None Help needed moving from lying on your back to sitting on the side of a flat bed without using bedrails?: None Help needed moving to and from a bed to a chair (including a wheelchair)?: None Help needed standing up from a chair using your arms (e.g., wheelchair or bedside chair)?: None Help needed to walk in hospital room?: A Little Help needed climbing 3-5 steps with a railing? : A Little 6 Click Score: 22    End of Session Equipment Utilized During Treatment: Gait belt Activity Tolerance:  Patient tolerated treatment well Patient left: with call bell/phone within reach;in chair;with chair alarm set Nurse Communication: Mobility status PT Visit Diagnosis: Difficulty in walking, not elsewhere classified (R26.2)     Time: [id number] PT Time Calculation (min) (ACUTE ONLY): 25 min  Charges:  $Gait Training: 8-22 mins $Therapeutic Activity: 8-22 mins                     Excell Seltzer, [facility]

## 2019-01-18 NOTE — TOC Initial Note (Signed)
Transition of Care [facility]) - Initial/Assessment Note    Patient Details  Name: Karen Rasmussen MRN: [id number] Date of Birth: 1945/10/26  Transition of Care [facility]) CM/SW Contact:    Dessa Phi, RN Phone Number: 01/18/2019, 1:26 PM  Clinical Narrative:  Kerney Elbe following for long term iv abx-will check coverage;[employer] rep Cory-HHRN-ongoing iv abx instruction. [employer]-Bariatric YRW to be delivered to rm prior d/c.                 Expected Discharge Plan: [place] Barriers to Discharge: Continued Medical Work up   Patient Goals and CMS Choice Patient states their goals for this hospitalization and ongoing recovery are:: go home CMS Medicare.gov Compare Post Acute Care list provided to:: Patient Choice offered to / list presented to : Patient  Expected Discharge Plan and Services Expected Discharge Plan: [place]   Discharge Planning Services: CM Consult Post Acute Care Choice: Durable Medical Equipment, Home Health Living arrangements for the past 2 months: Single Family Home                 DME Arranged: Walker rolling(Bariatric YRW) DME Agency: [employer] Date DME Agency Contacted: 01/18/19 Time DME Agency Contacted: [id number] Representative spoke with at DME Agency: [place] Arranged: RN, IV Antibiotics [place] Agency: [employer]) Date [place]: 01/18/19 Time [place]: 61 Representative spoke with at [place]: Pam-[employer]/Cory-[employer]  Prior Living Arrangements/Services Living arrangements for the past 2 months: [place] with:: Self Patient language and need for interpreter reviewed:: Yes Do you feel safe going back to the place where you live?: Yes      Need for Family Participation in Patient Care: No (Comment) Care giver support system in place?: Yes (comment)   Criminal Activity/Legal Involvement Pertinent to Current Situation/Hospitalization: No - Comment as needed  Activities  of Daily Living Home Assistive Devices/Equipment: Cane (specify quad or straight) ADL Screening (condition at time of admission) Patient's cognitive ability adequate to safely complete daily activities?: Yes Is the patient deaf or have difficulty hearing?: No Does the patient have difficulty seeing, even when wearing glasses/contacts?: No Does the patient have difficulty concentrating, remembering, or making decisions?: No Patient able to express need for assistance with ADLs?: Yes Does the patient have difficulty dressing or bathing?: No Independently performs ADLs?: Yes (appropriate for developmental age) Does the patient have difficulty walking or climbing stairs?: Yes Weakness of Legs: Both Weakness of Arms/Hands: None  Permission Sought/Granted Permission sought to share information with : Case Manager Permission granted to share information with : Yes, Verbal Permission Granted     Permission granted to share info w AGENCY: [employer]        Emotional Assessment Appearance:: Appears stated age Attitude/Demeanor/Rapport: Gracious Affect (typically observed): Accepting Orientation: : Oriented to Self, Oriented to Place, Oriented to  Time, Oriented to Situation Alcohol / Substance Use: Not Applicable Psych Involvement: No (comment)  Admission diagnosis:  Elevated troponin [R77.8] Acute renal injury ([place]) [N17.9] Urinary tract infection without hematuria, site unspecified [N39.0] Person under investigation for COVID-19 [Z20.828] Patient Active Problem List   Diagnosis Date Noted  . Acute pyelonephritis 01/17/2019  . UTI due to extended-spectrum beta lactamase (ESBL) producing Escherichia coli   . Left nephrolithiasis 01/14/2019  . Hyperosmolar hyperglycemic state (HHS) ([place]) 01/13/2019  . Acute renal injury ([place])   . Urinary tract infection without hematuria   . Elevated troponin   . Person under investigation for COVID-19   .  Preoperative clearance 11/02/2017  .  Hypokalemia 09/23/2016  . Encounter for monitoring sotalol therapy 09/21/2016  . DCM (dilated cardiomyopathy) ([place])   . Benign essential HTN 04/26/2016  . Persistent atrial fibrillation ([place]) 04/26/2016  . Morbid obesity with BMI of 40.0-44.9, adult ([place])   . Chronic diastolic CHF (congestive heart failure) ([place])   . Abdominal pain, left lower quadrant   . OSA (obstructive sleep apnea)   . Benign hypertensive heart disease without heart failure   . GERD (gastroesophageal reflux disease)   . Hypersomnia   . Depression   . Asthmatic bronchitis   . Bursitis of hip    PCP:  Kathyrn Lass, MD Pharmacy:   [id number] - [place], [address] AT [address]. [place] [address] Phone: [phone] Fax: [phone]     Social Determinants of Health (SDOH) Interventions    Readmission Risk Interventions No flowsheet data found.

## 2019-01-18 NOTE — Progress Notes (Signed)
PROGRESS NOTE    Karen Rasmussen  [id number] DOB: 01-03-1946 DOA: 01/12/2019 PCP: Kathyrn Lass, MD    Brief Narrative:  Patient is a morbidly obese Caucasain female with past medical history significant for but not limited tobenign essential hypertension, chronic diastolic CHF and dilated cardiomyopathy, GERD, hyperlipidemia, history of nephrolithiasis, morbid obesity, OSA on CPAP, persistent atrial fibrillation, type 2 diabetes mellitus, vitamin D deficiency and other comorbidities who presents with a chief complaint of weakness which is global, dry cough, night sweats and increased sugars. She reports that she is also had diarrhea recently. She states that symptoms have been going on for a little while now for at least 1 to 2 weeks and she has been more fatigued.  Of note she also had some flank pain and was worked up and found to have urinary tract infection.  A CT scan of the abdomen pelvis was done which showed an obstructing left ureteral calculus.  Urology was consulted and took the patient to the OR for stent placement and definitive laser lithotripsy eventually in the outpatient setting.  Currently she is on antibiotics with IV ceftriaxone which we will continue for now.  She is postoperative day 3 stenting and renal function is improving but patient continues to be significantly weak so PT and OT will be consulted for further evaluation.  She is also having some dysphagia so SLP evaluation has also been ordered and they recommended techniques given her Schatzki's ring.  Urine culture grew out ESBL E. coli so antibiotics were escalated to IV meropenem.     Assessment & Plan:   Principal Problem:   Acute pyelonephritis Active Problems:   UTI due to extended-spectrum beta lactamase (ESBL) producing Escherichia coli   Abdominal pain, left lower quadrant   OSA (obstructive sleep apnea)   GERD (gastroesophageal reflux disease)   Morbid obesity with BMI of 40.0-44.9, adult (HCC)  Chronic diastolic CHF (congestive heart failure) (HCC)   Benign essential HTN   Persistent atrial fibrillation (HCC)   DCM (dilated cardiomyopathy) (HCC)   Hypokalemia   Hyperosmolar hyperglycemic state (HHS) (HCC)   Acute renal injury ([place])   Urinary tract infection without hematuria   Elevated troponin   Left nephrolithiasis  1 acute pyelonephritis secondary to ESBL E. coli UTI in the setting of obstructing left ureteral nephrolithiasis status post left ureteral stenting Patient had presented with abdominal pain, noted to have left flank tenderness.  Urinalysis consistent with UTI.  Patient noted to have a leukocytosis.  Blood cultures ordered with no growth to date.  Urine cultures which were done with 10,000 colonies of of ESBL E. coli however cultures were not obtained until antibiotics were started.  Patient was initially on IV Rocephin and antibiotics coverage broadened to IV meropenem.  Will likely need empiric antibiotic treatment for about 10 days.  CT abdomen and pelvis which was done on admission that showed obstructing calculus at the left ureteral pelvic junction with mild left hydronephrosis as well as renal edema along the left nephrolithiasis and left colon diverticulosis without evidence of diverticulitis.  Urology consulted and patient underwent cystoscopy with left ureteral stent placement with eventual staging uteroscopic laser lithotripsy for definite stone management in approximately 3 weeks per urology.  Patient slowly improving clinically however still with significant weakness.  Renal function improved.  Continue IV antibiotics and could likely transition to IV ertapenem on discharge to complete a 10-day course of antibiotic treatment.  Follow.  2.  Hypokalemia Repleted.  3.  Persistent atrial  fibrillation Currently normal sinus rhythm.  Continue Coreg and sotalol.  Xarelto for anticoagulation.  Outpatient follow-up with cardiology.   4.  Elevated troponin Felt  secondary to a demand ischemia from problem #1.  Patient seen in consultation by cardiology.  No further cardiac work-up needed at this time.  5.  Uncontrolled diabetes mellitus type 2 Hemoglobin A1c 8.7.  Oral hypoglycemic agents on hold.  On admission patient noted to have a elevated blood glucose level of 465.  CBG of 225 this morning.  Patient currently on Lantus which will increase to 34 units daily and increase meal coverage NovoLog to 10 units 3 times daily with meals.  Continue sliding scale insulin.  Diabetes coordinator following and appreciate their input and recommendations.    6.  Dilated cardiomyopathy/history of chronic diastolic CHF Currently stable.  Continue cardiac regimen of carvedilol and spironolactone.  Spironolactone was initially held on admission and have subsequently been resumed.  Monitor for signs of volume overload.  Strict I's and O's.  Daily weights.  7.  Wheezing Noted on examination on 01/17/2019.  Clinical improvement after a dose of IV Solu-Medrol and duo nebs changed to 3 times daily.  Continue current scheduled duo nebs..   8.  Acute kidney injury on chronic kidney disease stage IIIb/metabolic acidosis Improved.  Spironolactone initially held but has been resumed.  IV fluids discontinued.  Avoid nephrotoxic agents.  Follow.  9.  Generalized weakness Likely secondary to acute infection.  Patient seen by PT/ OT.  Continue empiric treatment for acute pyelonephritis.  Follow.  10.  Hyperlipidemia Continue statin.  11.  Hyponatremia Resolved.   12.  Hypophosphatemia Repleted.  13.  Dysphagia Patient has been seen by speech therapy.  Patient noted to have a history of dysphagia with high Isharani/Keates ring status post dilatation October 2018.  Speech therapy recommending continue current diet as tolerated given patient's awareness for her dysphagia and education regarding compensation strategies.  Outpatient follow-up with GI.  14.  OSA CPAP nightly.    15.  GERD Continue PPI.  16.  Morbid obesity   DVT prophylaxis: Xarelto Code Status: Full Family Communication: Updated patient.  No family at bedside. Disposition Plan: Likely home when clinically improved hopefully in 48 hours.   Consultants:   Urology: Dr. Claudia Desanctis 01/14/2019  Cardiology: Dr. Caryl Comes 01/13/2019  Procedures:   CT abdomen and pelvis 01/13/2019  Chest x-ray 01/12/2019, 01/15/2019  VQ scan 01/13/2019  Cystoscopy with left ureteral stent placement per Dr. Claudia Desanctis urology 01/14/2019  Antimicrobials:   IV Rocephin 01/13/2019>>>> 01/15/2019  IV Merrem 01/15/2019   Subjective: Patient sitting up in chair.  Feels breathing improved with steroid and breathing treatments.  Left flank pain slowly improving.  Dysuria slowly improving.  Denies any chest pain.  No shortness of breath.  Objective: Vitals:   01/17/19 2015 01/17/19 2053 01/18/19 0609 01/18/19 0747  BP:  (!) 130/56 (!) 143/65   Pulse:  73 60   Resp:  18 18   Temp:  97.7 F (36.5 C) 97.9 F (36.6 C)   TempSrc:  Oral Oral   SpO2: 96% 95% 95% 98%  Weight:      Height:        Intake/Output Summary (Last 24 hours) at 01/18/2019 1224 Last data filed at 01/18/2019 0900 Gross per 24 hour  Intake 680 ml  Output 1450 ml  Net -770 ml   Filed Weights   01/13/19 0013  Weight: 123.4 kg    Examination:  General exam: NAD Respiratory  system: Improved air movement.  No crackles.  No wheezing.  Normal respiratory effort.  Cardiovascular system: Regular rate rhythm no murmurs rubs or gallops.  No JVD.  No lower extremity edema. Gastrointestinal system: Abdomen is soft, nontender, nondistended, positive bowel sounds.  No rebound.  No guarding.  Left CVA tenderness to palpation.  Central nervous system: Alert and oriented. No focal neurological deficits. Extremities: Symmetric 5 x 5 power. Skin: No rashes, lesions or ulcers Psychiatry: Judgement and insight appear normal. Mood & affect appropriate.     Data  Reviewed: I have personally reviewed following labs and imaging studies  CBC: Recent Labs  Lab 01/14/19 0537 01/15/19 0428 01/16/19 0528 01/17/19 0522 01/18/19 0538  WBC 12.8* 10.9* 13.1* 9.7 12.2*  NEUTROABS 10.4* 9.2* 8.6* 5.3 9.5*  HGB 10.4* 10.3* 10.6* 11.4* 11.9*  HCT 34.4* 35.0* 35.3* 38.3 38.8  MCV 91.0 94.1 92.2 93.2 91.7  PLT 237 241 330 343 [id number]   Basic Metabolic Panel: Recent Labs  Lab 01/14/19 0537 01/15/19 0428 01/16/19 0528 01/17/19 0522 01/18/19 0538  NA 136 138 139 141 138  K 4.5 4.3 4.1 3.9 4.0  CL 105 105 106 106 102  CO2 20* 22 23 26 27   GLUCOSE 203* 270* 227* 175* 248*  BUN 21 21 23 21 20   CREATININE 1.43* 1.26* 1.02* 0.91 0.78  CALCIUM 8.2* 8.4* 8.3* 9.0 8.7*  MG 2.3 2.4 2.2 1.9 1.9  PHOS 2.4* 4.4 3.0 4.5 3.6   GFR: Estimated Creatinine Clearance: 82 mL/min (by C-G formula based on SCr of 0.78 mg/dL). Liver Function Tests: Recent Labs  Lab 01/13/19 0542 01/14/19 0537 01/15/19 0428 01/16/19 0528 01/17/19 0522  AST 16 14* 14* 15 16  ALT 33 24 27 25 26   ALKPHOS 123 98 105 90 81  BILITOT 0.6 1.0 0.5 1.0 0.5  PROT 6.1* 6.1* 6.1* 6.0* 5.6*  ALBUMIN 2.5* 2.5* 2.4* 2.5* 2.3*   No results for input(s): LIPASE, AMYLASE in the last 168 hours. No results for input(s): AMMONIA in the last 168 hours. Coagulation Profile: No results for input(s): INR, PROTIME in the last 168 hours. Cardiac Enzymes: No results for input(s): CKTOTAL, CKMB, CKMBINDEX, TROPONINI in the last 168 hours. BNP (last 3 results) No results for input(s): PROBNP in the last 8760 hours. HbA1C: No results for input(s): HGBA1C in the last 72 hours. CBG: Recent Labs  Lab 01/17/19 1223 01/17/19 1621 01/17/19 2054 01/17/19 2149 01/18/19 0745  GLUCAP 147* 181* 373* 366* 225*   Lipid Profile: No results for input(s): CHOL, HDL, LDLCALC, TRIG, CHOLHDL, LDLDIRECT in the last 72 hours. Thyroid Function Tests: No results for input(s): TSH, T4TOTAL, FREET4, T3FREE, THYROIDAB in  the last 72 hours. Anemia Panel: Recent Labs    01/16/19 0528  VITAMINB12 924*  FOLATE 9.6  FERRITIN 226  TIBC 329  IRON 57  RETICCTPCT 1.9   Sepsis Labs: No results for input(s): PROCALCITON, LATICACIDVEN in the last 168 hours.  Recent Results (from the past 240 hour(s))  SARS CORONAVIRUS 2 (TAT 6-24 HRS) Nasopharyngeal Nasopharyngeal Swab     Status: None   Collection Time: 01/13/19 12:24 AM   Specimen: Nasopharyngeal Swab  Result Value Ref Range Status   SARS Coronavirus 2 NEGATIVE NEGATIVE Final    Comment: (NOTE) SARS-CoV-2 target nucleic acids are NOT DETECTED. The SARS-CoV-2 RNA is generally detectable in upper and lower respiratory specimens during the acute phase of infection. Negative results do not preclude SARS-CoV-2 infection, do not rule out co-infections with other pathogens, and  should not be used as the sole basis for treatment or other patient management decisions. Negative results must be combined with clinical observations, patient history, and epidemiological information. The expected result is Negative. Fact Sheet for Patients: SugarRoll.be Fact Sheet for Healthcare Providers: https://www.woods-mathews.com/ This test is not yet approved or cleared by the [place] FDA and  has been authorized for detection and/or diagnosis of SARS-CoV-2 by FDA under an Emergency Use Authorization (EUA). This EUA will remain  in effect (meaning this test can be used) for the duration of the COVID-19 declaration under Section 56 4(b)(1) of the Act, 21 U.S.C. section 360bbb-3(b)(1), unless the authorization is terminated or revoked sooner. Performed at [facility], [place] [address] [place], [place] [address]   Blood culture (routine x 2)     Status: None   Collection Time: 01/13/19 12:24 AM   Specimen: BLOOD  Result Value Ref Range Status   Specimen Description   Final    BLOOD RIGHT ANTECUBITAL Performed at [place] [address], [place], [place] [address]    Special Requests   Final    BOTTLES DRAWN AEROBIC AND ANAEROBIC Blood Culture adequate volume Performed at [place] [address] [place], [place] [address]    Culture   Final    NO GROWTH 5 DAYS Performed at [facility], [place] [address] [place], [place] [address]    Report Status 01/18/2019 FINAL  Final  Blood culture (routine x 2)     Status: None   Collection Time: 01/13/19  2:23 AM   Specimen: BLOOD  Result Value Ref Range Status   Specimen Description   Final    BLOOD LEFT ANTECUBITAL Performed at [place] [address], [place], [place] [address]    Special Requests   Final    BOTTLES DRAWN AEROBIC AND ANAEROBIC Blood Culture adequate volume Performed at [place] [address], [place], [place] [address]    Culture   Final    NO GROWTH 5 DAYS Performed at [facility], [place] [address] [place], [place] [address]    Report Status 01/18/2019 FINAL  Final  Culture, Urine     Status: Abnormal   Collection Time: 01/13/19  8:04 AM   Specimen: Urine, Clean Catch  Result Value Ref Range Status   Specimen Description   Final    URINE, CLEAN CATCH Performed at [facility], [place] [address] [place], [place] [address]    Special Requests   Final    NONE Performed at [facility], [place] [address] [place], [place] [address]    Culture (A)  Final    10,000 COLONIES/mL ESCHERICHIA COLI Confirmed Extended Spectrum Beta-Lactamase Producer (ESBL).  In bloodstream infections from ESBL organisms, carbapenems are preferred over piperacillin/tazobactam. They are shown to have a lower risk of mortality.    Report Status 01/15/2019 FINAL  Final   Organism ID, Bacteria ESCHERICHIA COLI (A)  Final      Susceptibility   Escherichia coli - MIC*    AMPICILLIN >=32 RESISTANT Resistant     CEFAZOLIN  >=64 RESISTANT Resistant     CEFTRIAXONE RESISTANT Resistant     CIPROFLOXACIN <=0.25 SENSITIVE Sensitive     GENTAMICIN <=1 SENSITIVE Sensitive     IMIPENEM 0.5 SENSITIVE Sensitive     NITROFURANTOIN 64 INTERMEDIATE Intermediate     TRIMETH/SULFA <=20 SENSITIVE Sensitive     AMPICILLIN/SULBACTAM >=  32 RESISTANT Resistant     Extended ESBL POSITIVE Resistant     * 10,000 COLONIES/mL ESCHERICHIA COLI  MRSA PCR Screening     Status: None   Collection Time: 01/14/19  5:10 PM   Specimen: Nasal Mucosa; Nasopharyngeal  Result Value Ref Range Status   MRSA by PCR NEGATIVE NEGATIVE Final    Comment:        The GeneXpert MRSA Assay (FDA approved for NASAL specimens only), is one component of a comprehensive MRSA colonization surveillance program. It is not intended to diagnose MRSA infection nor to guide or monitor treatment for MRSA infections. Performed at [facility], [place] [address] [place], [place] [address]          Radiology Studies: No results found.      Scheduled Meds:  aspirin EC  81 mg Oral Daily   carvedilol  3.125 mg Oral BID WC   cholecalciferol  5,000 Units Oral Daily   folic acid  1 mg Oral Daily   insulin aspart  0-15 Units Subcutaneous TID WC & HS   insulin aspart  10 Units Subcutaneous TID WC   insulin glargine  34 Units Subcutaneous Daily   ipratropium-albuterol  3 mL Nebulization TID   multivitamin with minerals  1 tablet Oral Daily   pantoprazole  40 mg Oral Daily   psyllium  1 packet Oral BID   rivaroxaban  20 mg Oral QPM   simvastatin  10 mg Oral QHS   sotalol  80 mg Oral BID   spironolactone  12.5 mg Oral Daily   tamsulosin  0.4 mg Oral Daily   Continuous Infusions:  meropenem (MERREM) IV 1 g (01/18/19 1055)     LOS: 5 days    Time spent: 40 minutes    Irine Seal, MD [facility]  If 7PM-7AM, please contact night-coverage [email or web address] 01/18/2019, 12:24 PM

## 2019-01-18 NOTE — Progress Notes (Signed)
PHARMACY CONSULT NOTE FOR:  OUTPATIENT  PARENTERAL ANTIBIOTIC THERAPY (OPAT)  Indication: ESBL UTI Regimen: Ertapenem 1 g IV q24 hr  End date: 01/24/2019  IV antibiotic discharge orders are pended. To discharging provider:  please sign these orders via discharge navigator,  Select New Orders & click on the button choice - Manage This Unsigned Work.     Thank you for allowing pharmacy to be a part of this patient's care.  Karen Rasmussen A 01/18/2019, 1:49 PM

## 2019-01-19 DIAGNOSIS — E1100 Type 2 diabetes mellitus with hyperosmolarity without nonketotic hyperglycemic-hyperosmolar coma (NKHHC): Secondary | ICD-10-CM

## 2019-01-19 DIAGNOSIS — E1165 Type 2 diabetes mellitus with hyperglycemia: Secondary | ICD-10-CM

## 2019-01-19 LAB — CBC WITH DIFFERENTIAL/PLATELET
Abs Immature Granulocytes: 0.42 10*3/uL — ABNORMAL HIGH (ref 0.00–0.07)
Basophils Absolute: 0.1 10*3/uL (ref 0.0–0.1)
Basophils Relative: 1 %
Eosinophils Absolute: 0.2 10*3/uL (ref 0.0–0.5)
Eosinophils Relative: 2 %
HCT: 38 % (ref 36.0–46.0)
Hemoglobin: 11.3 g/dL — ABNORMAL LOW (ref 12.0–15.0)
Immature Granulocytes: 4 %
Lymphocytes Relative: 33 %
Lymphs Abs: 3.5 10*3/uL (ref 0.7–4.0)
MCH: 27.5 pg (ref 26.0–34.0)
MCHC: 29.7 g/dL — ABNORMAL LOW (ref 30.0–36.0)
MCV: 92.5 fL (ref 80.0–100.0)
Monocytes Absolute: 0.7 10*3/uL (ref 0.1–1.0)
Monocytes Relative: 7 %
Neutro Abs: 5.5 10*3/uL (ref 1.7–7.7)
Neutrophils Relative %: 53 %
Platelets: 404 10*3/uL — ABNORMAL HIGH (ref 150–400)
RBC: 4.11 MIL/uL (ref 3.87–5.11)
RDW: 13.9 % (ref 11.5–15.5)
WBC: 10.5 10*3/uL (ref 4.0–10.5)
nRBC: 0 % (ref 0.0–0.2)

## 2019-01-19 LAB — GLUCOSE, CAPILLARY
Glucose-Capillary: 91 mg/dL (ref 70–99)
Glucose-Capillary: 102 mg/dL — ABNORMAL HIGH (ref 70–99)
Glucose-Capillary: 242 mg/dL — ABNORMAL HIGH (ref 70–99)
Glucose-Capillary: 253 mg/dL — ABNORMAL HIGH (ref 70–99)

## 2019-01-19 LAB — BASIC METABOLIC PANEL
Anion gap: 9 (ref 5–15)
BUN: 22 mg/dL (ref 8–23)
CO2: 27 mmol/L (ref 22–32)
Calcium: 8.7 mg/dL — ABNORMAL LOW (ref 8.9–10.3)
Chloride: 105 mmol/L (ref 98–111)
Creatinine, Ser: 0.77 mg/dL (ref 0.44–1.00)
GFR calc Af Amer: >60 mL/min (ref >60)
GFR calc non Af Amer: >60 mL/min (ref >60)
Glucose, Bld: 101 mg/dL — ABNORMAL HIGH (ref 70–99)
Potassium: 3.7 mmol/L (ref 3.5–5.1)
Sodium: 141 mmol/L (ref 135–145)

## 2019-01-19 MED ORDER — FUROSEMIDE 10 MG/ML IJ SOLN
20.0000 mg | Freq: Once | INTRAMUSCULAR | Status: AC
  Administered 2019-01-19: 20 mg via INTRAVENOUS
  Filled 2019-01-19: qty 2

## 2019-01-19 MED ORDER — ERTAPENEM IV (FOR PTA / DISCHARGE USE ONLY): 1.0000 g | INTRAVENOUS | 0 refills | Status: AC

## 2019-01-19 MED ORDER — SODIUM CHLORIDE 0.9 % IV SOLN
1.0000 g | INTRAVENOUS | Status: DC
  Administered 2019-01-20: 1000 mg via INTRAVENOUS
  Filled 2019-01-19 (×2): qty 1

## 2019-01-19 MED ORDER — PREDNISONE 20 MG PO TABS
40.0000 mg | ORAL_TABLET | Freq: Every day | ORAL | Status: DC
  Administered 2019-01-19 – 2019-01-20 (×2): 40 mg via ORAL
  Filled 2019-01-19 (×2): qty 2

## 2019-01-19 MED ORDER — POTASSIUM CHLORIDE CRYS ER 20 MEQ PO TBCR
40.0000 meq | EXTENDED_RELEASE_TABLET | Freq: Once | ORAL | Status: AC
  Administered 2019-01-19: 40 meq via ORAL
  Filled 2019-01-19: qty 2

## 2019-01-19 NOTE — Progress Notes (Signed)
Inpatient Diabetes Program Recommendations  AACE/ADA: New Consensus Statement on Inpatient Glycemic Control (2015)  Target Ranges:  Prepandial:   less than 140 mg/dL      Peak postprandial:   less than 180 mg/dL (1-2 hours)      Critically ill patients:  140 - 180 mg/dL   Lab Results  Component Value Date   GLUCAP 242 (H) 01/19/2019   HGBA1C 8.7 (H) 01/13/2019    Review of Glycemic Control  CBGs today - 91-242 mg/dL On Lantus 34 units QD, Novolog 10 units tidwc + 0-15 units tidwc and 0-5 units QHS  Educated patient on insulin pen use at home. Reviewed contents of insulin flexpen starter kit. Reviewed all steps if insulin pen including attachment of needle, 2-unit air shot, dialing up dose, giving injection, removing needle, disposal of sharps, storage of unused insulin, disposal of insulin etc. Patient able to provide successful return demonstration. Also reviewed troubleshooting with insulin pen. MD to give patient Rxs for insulin pens and insulin pen needles.  Pt states she feels good about going home on insulin. Has new exercise bike that she purchased prior to hospitalization and is motivated to start moving her body.  Lengthy discussion about importance of controlling blood sugars to prevent long and short-term complications. Seems ready to eat healthier, use portion control and adding whole-grains and fiber. Discussed hypoglycemia s/s and treatment. Encouraged pt to always have juice, regular soda or glucose tablets with her at all times to treat hypoglycemia.  Discharge meds:   Lantus Solostar pen 34 units QD Novolog Flexpen 12 units tidwc. Do not take if you skip a meal. Insulin pen needles (#[id number]) Blood glucose meter kit (#[id number])  Thank you. Lorenda Peck, RD, LDN, CDE Inpatient Diabetes Coordinator [phone]

## 2019-01-19 NOTE — Progress Notes (Signed)
PROGRESS NOTE    Karen Rasmussen  [id number] DOB: [birth year] DOA: 01/12/2019 PCP: Kathyrn Lass, MD    Brief Narrative:  Patient is a morbidly obese Caucasain female with past medical history significant for but not limited tobenign essential hypertension, chronic diastolic CHF and dilated cardiomyopathy, GERD, hyperlipidemia, history of nephrolithiasis, morbid obesity, OSA on CPAP, persistent atrial fibrillation, type 2 diabetes mellitus, vitamin D deficiency and other comorbidities who presents with a chief complaint of weakness which is global, dry cough, night sweats and increased sugars. She reports that she is also had diarrhea recently. She states that symptoms have been going on for a little while now for at least 1 to 2 weeks and she has been more fatigued.  Of note she also had some flank pain and was worked up and found to have urinary tract infection.  A CT scan of the abdomen pelvis was done which showed an obstructing left ureteral calculus.  Urology was consulted and took the patient to the OR for stent placement and definitive laser lithotripsy eventually in the outpatient setting.  Currently she is on antibiotics with IV ceftriaxone which we will continue for now.  She is postoperative day 3 stenting and renal function is improving but patient continues to be significantly weak so PT and OT will be consulted for further evaluation.  She is also having some dysphagia so SLP evaluation has also been ordered and they recommended techniques given her Schatzki's ring.  Urine culture grew out ESBL E. coli so antibiotics were escalated to IV meropenem.     Assessment & Plan:   Principal Problem:   Acute pyelonephritis Active Problems:   UTI due to extended-spectrum beta lactamase (ESBL) producing Escherichia coli   Abdominal pain, left lower quadrant   OSA (obstructive sleep apnea)   GERD (gastroesophageal reflux disease)   Morbid obesity with BMI of 40.0-44.9, adult (HCC)  Chronic diastolic CHF (congestive heart failure) (HCC)   Benign essential HTN   Persistent atrial fibrillation (HCC)   DCM (dilated cardiomyopathy) (HCC)   Hypokalemia   Hyperosmolar hyperglycemic state (HHS) (HCC)   Acute renal injury ([place])   Urinary tract infection without hematuria   Elevated troponin   Left nephrolithiasis  1 acute pyelonephritis secondary to ESBL E. coli UTI in the setting of obstructing left ureteral nephrolithiasis status post left ureteral stenting Patient had presented with abdominal pain, noted to have left flank tenderness.  Urinalysis consistent with UTI.  Patient noted to have a leukocytosis.  Blood cultures ordered with no growth to date.  Urine cultures which were done with 10,000 colonies of of ESBL E. coli however cultures were not obtained until antibiotics were started.  Patient was initially on IV Rocephin and antibiotics coverage broadened to IV meropenem.  Will likely need empiric antibiotic treatment for about 10 days.  CT abdomen and pelvis which was done on admission that showed obstructing calculus at the left ureteral pelvic junction with mild left hydronephrosis as well as renal edema along the left nephrolithiasis and left colon diverticulosis without evidence of diverticulitis.  Urology consulted and patient underwent cystoscopy with left ureteral stent placement with eventual staging uteroscopic laser lithotripsy for definite stone management in approximately 3 weeks per urology.  Patient slowly improving clinically however still with significant weakness.  Renal function improved.  Continue IV antibiotics and could likely transition to IV ertapenem tomorrow, on discharge to complete a 10-day course of antibiotic treatment.  Follow.  2.  Hypokalemia Potassium at 3.7.  Repleted.  3.  Persistent atrial fibrillation Currently normal sinus rhythm.  Continue Coreg and sotalol.  Xarelto for anticoagulation.  K. Dur 40 mEq p.o. x1 as patient on sotalol and  will try to keep potassium close to 4.  Outpatient follow-up with cardiology.   4.  Elevated troponin Felt secondary to a demand ischemia from problem #1.  Patient seen in consultation by cardiology.  No further cardiac work-up needed at this time.  5.  Uncontrolled diabetes mellitus type 2 Hemoglobin A1c 8.7.  Oral hypoglycemic agents on hold.  On admission patient noted to have a elevated blood glucose level of 465.  CBG of 91 this morning.  Patient currently on Lantus 34 units daily and meal coverage NovoLog to 10 units 3 times daily with meals.  Continue sliding scale insulin.  Patient will likely need to go home on insulin.  Diabetes coordinator following and appreciate their input and recommendations.    6.  Dilated cardiomyopathy/history of chronic diastolic CHF Currently stable.  Continue cardiac regimen of carvedilol and spironolactone.  Spironolactone was initially held on admission and have subsequently been resumed.  Monitor for signs of volume overload.  Patient with some expiratory wheezing we will give a dose of Lasix 20 mg IV x1.  Strict I's and O's.  Daily weights.  7.  Wheezing Noted on examination on 01/17/2019.  Improving clinically however patient with some minimal expiratory wheezing.  Patient received a dose of IV Solu-Medrol on 01/17/2019.  Will place on prednisone 40 mg daily x4 -5 days.  Continue scheduled duo nebs.  We will give a dose of Lasix 20 IV x1.  8.  Acute kidney injury on chronic kidney disease stage IIIb/metabolic acidosis Improved.  Spironolactone initially held but has been resumed.  IV fluids discontinued.  Avoid nephrotoxic agents.  Follow.  9.  Generalized weakness Likely secondary to acute infection.  Patient seen by PT/ OT.  Continue empiric treatment for acute pyelonephritis.  Follow.  10.  Hyperlipidemia Continue statin.  11.  Hyponatremia Resolved.  12.  Hypophosphatemia Repleted.   13.  Dysphagia Patient has been seen by speech therapy.   Patient noted to have a history of dysphagia with high Isharani/Keates ring status post dilatation October 2018.  Speech therapy recommending continue current diet as tolerated given patient's awareness for her dysphagia and education regarding compensation strategies.  Outpatient follow-up with GI.  14.  OSA CPAP nightly.   15.  GERD Continue PPI.  16.  Morbid obesity   DVT prophylaxis: Xarelto Code Status: Full Family Communication: Updated patient.  No family at bedside. Disposition Plan: Likely home with home health tomorrow if continued improvement.    Consultants:   Urology: Dr. Claudia Desanctis 01/14/2019  Cardiology: Dr. Caryl Comes 01/13/2019  Procedures:   CT abdomen and pelvis 01/13/2019  Chest x-ray 01/12/2019, 01/15/2019  VQ scan 01/13/2019  Cystoscopy with left ureteral stent placement per Dr. Claudia Desanctis urology 01/14/2019  Antimicrobials:   IV Rocephin 01/13/2019>>>> 01/15/2019  IV Merrem 01/15/2019   Subjective: Patient in bed with diabetes coordinator teaching patient how to self inject insulin.  Patient denies any chest pain.  Patient feels shortness of breath is improving.  Patient denies any abdominal pain.  Patient states left flank pain improving.   Objective: Vitals:   01/18/19 2110 01/18/19 2243 01/19/19 0624 01/19/19 0941  BP:  120/64 132/68   Pulse:  63 (!) 56   Resp:  16 16   Temp:  97.9 F (36.6 C) (!) 97.3 F (36.3 C)  TempSrc:  Oral Oral   SpO2: 95% 97% 95% 97%  Weight:      Height:        Intake/Output Summary (Last 24 hours) at 01/19/2019 1022 Last data filed at 01/18/2019 2031 Gross per 24 hour  Intake 720 ml  Output 1300 ml  Net -580 ml   Filed Weights   01/13/19 0013  Weight: 123.4 kg    Examination:  General exam: NAD Respiratory system: Minimal expiratory wheezing.  No crackles.  Speaking in full sentences.  Cardiovascular system: RRR no murmurs rubs or gallops.  No JVD.  No lower extremity edema.  Gastrointestinal system: Abdomen is  soft, nontender, nondistended, positive bowel sounds.  No rebound.  No guarding.  Left CVA tenderness improving. Central nervous system: Alert and oriented. No focal neurological deficits. Extremities: Symmetric 5 x 5 power. Skin: No rashes, lesions or ulcers Psychiatry: Judgement and insight appear normal. Mood & affect appropriate.     Data Reviewed: I have personally reviewed following labs and imaging studies  CBC: Recent Labs  Lab 01/15/19 0428 01/16/19 0528 01/17/19 0522 01/18/19 0538 01/19/19 0501  WBC 10.9* 13.1* 9.7 12.2* 10.5  NEUTROABS 9.2* 8.6* 5.3 9.5* 5.5  HGB 10.3* 10.6* 11.4* 11.9* 11.3*  HCT 35.0* 35.3* 38.3 38.8 38.0  MCV 94.1 92.2 93.2 91.7 92.5  PLT 241 330 343 389 [id number]*   Basic Metabolic Panel: Recent Labs  Lab 01/14/19 0537 01/15/19 0428 01/16/19 0528 01/17/19 0522 01/18/19 0538 01/19/19 0501  NA 136 138 139 141 138 141  K 4.5 4.3 4.1 3.9 4.0 3.7  CL 105 105 106 106 102 105  CO2 20* 22 23 26 27 27   GLUCOSE 203* 270* 227* 175* 248* 101*  BUN 21 21 23 21 20 22   CREATININE 1.43* 1.26* 1.02* 0.91 0.78 0.77  CALCIUM 8.2* 8.4* 8.3* 9.0 8.7* 8.7*  MG 2.3 2.4 2.2 1.9 1.9  --   PHOS 2.4* 4.4 3.0 4.5 3.6  --    GFR: Estimated Creatinine Clearance: 82 mL/min (by C-G formula based on SCr of 0.77 mg/dL). Liver Function Tests: Recent Labs  Lab 01/13/19 0542 01/14/19 0537 01/15/19 0428 01/16/19 0528 01/17/19 0522  AST 16 14* 14* 15 16  ALT 33 24 27 25 26   ALKPHOS 123 98 105 90 81  BILITOT 0.6 1.0 0.5 1.0 0.5  PROT 6.1* 6.1* 6.1* 6.0* 5.6*  ALBUMIN 2.5* 2.5* 2.4* 2.5* 2.3*   No results for input(s): LIPASE, AMYLASE in the last 168 hours. No results for input(s): AMMONIA in the last 168 hours. Coagulation Profile: No results for input(s): INR, PROTIME in the last 168 hours. Cardiac Enzymes: No results for input(s): CKTOTAL, CKMB, CKMBINDEX, TROPONINI in the last 168 hours. BNP (last 3 results) No results for input(s): PROBNP in the last 8760  hours. HbA1C: No results for input(s): HGBA1C in the last 72 hours. CBG: Recent Labs  Lab 01/18/19 0745 01/18/19 1233 01/18/19 1652 01/18/19 1956 01/19/19 0747  GLUCAP 225* 205* 172* 232* 91   Lipid Profile: No results for input(s): CHOL, HDL, LDLCALC, TRIG, CHOLHDL, LDLDIRECT in the last 72 hours. Thyroid Function Tests: No results for input(s): TSH, T4TOTAL, FREET4, T3FREE, THYROIDAB in the last 72 hours. Anemia Panel: No results for input(s): VITAMINB12, FOLATE, FERRITIN, TIBC, IRON, RETICCTPCT in the last 72 hours. Sepsis Labs: No results for input(s): PROCALCITON, LATICACIDVEN in the last 168 hours.  Recent Results (from the past 240 hour(s))  SARS CORONAVIRUS 2 (TAT 6-24 HRS) Nasopharyngeal Nasopharyngeal Swab  Status: None   Collection Time: 01/13/19 12:24 AM   Specimen: Nasopharyngeal Swab  Result Value Ref Range Status   SARS Coronavirus 2 NEGATIVE NEGATIVE Final    Comment: (NOTE) SARS-CoV-2 target nucleic acids are NOT DETECTED. The SARS-CoV-2 RNA is generally detectable in upper and lower respiratory specimens during the acute phase of infection. Negative results do not preclude SARS-CoV-2 infection, do not rule out co-infections with other pathogens, and should not be used as the sole basis for treatment or other patient management decisions. Negative results must be combined with clinical observations, patient history, and epidemiological information. The expected result is Negative. Fact Sheet for Patients: SugarRoll.be Fact Sheet for Healthcare Providers: https://www.woods-mathews.com/ This test is not yet approved or cleared by the [place] FDA and  has been authorized for detection and/or diagnosis of SARS-CoV-2 by FDA under an Emergency Use Authorization (EUA). This EUA will remain  in effect (meaning this test can be used) for the duration of the COVID-19 declaration under Section 56 4(b)(1) of the Act,  21 U.S.C. section 360bbb-3(b)(1), unless the authorization is terminated or revoked sooner. Performed at [facility], [place] [address] [place], [place] [address]   Blood culture (routine x 2)     Status: None   Collection Time: 01/13/19 12:24 AM   Specimen: BLOOD  Result Value Ref Range Status   Specimen Description   Final    BLOOD RIGHT ANTECUBITAL Performed at [facility] [address] [place], [place] [address]    Special Requests   Final    BOTTLES DRAWN AEROBIC AND ANAEROBIC Blood Culture adequate volume Performed at [facility] [address], [place], [place] [address]    Culture   Final    NO GROWTH 5 DAYS Performed at [facility], [place] [address] [place], [place] [address]    Report Status 01/18/2019 FINAL  Final  Blood culture (routine x 2)     Status: None   Collection Time: 01/13/19  2:23 AM   Specimen: BLOOD  Result Value Ref Range Status   Specimen Description   Final    BLOOD LEFT ANTECUBITAL Performed at [facility] [address], [place], [place] [address]    Special Requests   Final    BOTTLES DRAWN AEROBIC AND ANAEROBIC Blood Culture adequate volume Performed at [facility] [address] [place], [place] [address]    Culture   Final    NO GROWTH 5 DAYS Performed at [facility], [place] [address], [place], [place] [address]    Report Status 01/18/2019 FINAL  Final  Culture, Urine     Status: Abnormal   Collection Time: 01/13/19  8:04 AM   Specimen: Urine, Clean Catch  Result Value Ref Range Status   Specimen Description   Final    URINE, CLEAN CATCH Performed at [facility], [place] [address] [place], [place] [address]    Special Requests   Final    NONE Performed at [facility], [place] [address], [place],  [address]    Culture (A)  Final    10,000 COLONIES/mL ESCHERICHIA COLI Confirmed Extended  Spectrum Beta-Lactamase Producer (ESBL).  In bloodstream infections from ESBL organisms, carbapenems are preferred over piperacillin/tazobactam. They are shown to have a lower risk of mortality.    Report Status 01/15/2019 FINAL  Final   Organism ID, Bacteria ESCHERICHIA COLI (A)  Final  Susceptibility   Escherichia coli - MIC*    AMPICILLIN >=32 RESISTANT Resistant     CEFAZOLIN >=64 RESISTANT Resistant     CEFTRIAXONE RESISTANT Resistant     CIPROFLOXACIN <=0.25 SENSITIVE Sensitive     GENTAMICIN <=1 SENSITIVE Sensitive     IMIPENEM 0.5 SENSITIVE Sensitive     NITROFURANTOIN 64 INTERMEDIATE Intermediate     TRIMETH/SULFA <=20 SENSITIVE Sensitive     AMPICILLIN/SULBACTAM >=32 RESISTANT Resistant     Extended ESBL POSITIVE Resistant     * 10,000 COLONIES/mL ESCHERICHIA COLI  MRSA PCR Screening     Status: None   Collection Time: 01/14/19  5:10 PM   Specimen: Nasal Mucosa; Nasopharyngeal  Result Value Ref Range Status   MRSA by PCR NEGATIVE NEGATIVE Final    Comment:        The GeneXpert MRSA Assay (FDA approved for NASAL specimens only), is one component of a comprehensive MRSA colonization surveillance program. It is not intended to diagnose MRSA infection nor to guide or monitor treatment for MRSA infections. Performed at [facility], [place] [address], [place], [place] [address]          Radiology Studies: [place] EKG SITE RITE  Result Date: 01/18/2019 If Site Rite image not attached, placement could not be confirmed due to current cardiac rhythm.       Scheduled Meds: . aspirin EC  81 mg Oral Daily  . carvedilol  3.125 mg Oral BID WC  . cholecalciferol  5,000 Units Oral Daily  . folic acid  1 mg Oral Daily  . insulin aspart  0-15 Units Subcutaneous TID WC & HS  . insulin aspart  10 Units Subcutaneous TID WC  . insulin glargine  34 Units Subcutaneous Daily  . ipratropium-albuterol  3 mL Nebulization TID  . multivitamin with minerals  1  tablet Oral Daily  . pantoprazole  40 mg Oral Daily  . psyllium  1 packet Oral BID  . rivaroxaban  20 mg Oral QPM  . simvastatin  10 mg Oral QHS  . sotalol  80 mg Oral BID  . spironolactone  12.5 mg Oral Daily  . tamsulosin  0.4 mg Oral Daily   Continuous Infusions: . [START ON 01/20/2019] ertapenem    . meropenem (MERREM) IV 1 g (01/19/19 0235)     LOS: 6 days    Time spent: 40 minutes    Irine Seal, MD [facility]  If 7PM-7AM, please contact night-coverage [email or web address] 01/19/2019, 10:22 AM

## 2019-01-19 NOTE — Progress Notes (Signed)
NUTRITION NOTE  RD consulted for nutrition education regarding diabetes.   Lab Results  Component Value Date   HGBA1C 8.7 (H) 01/13/2019    RD provided "Label Reading Tips for Diabetes" and "Carbohydrate Counting for People with Diabetes" handout from the Academy of Nutrition and Dietetics. Discussed different food groups and their effects on blood sugar, emphasizing carbohydrate-containing foods. Provided list of carbohydrates and recommended serving sizes of common foods.  Discussed importance of controlled and consistent carbohydrate intake throughout the day. Provided examples of ways to balance meals/snacks and encouraged intake of high-fiber, whole grain complex carbohydrates. Teach back method used.  Patient has a medical history which includes CHF, HTN, cardiomyopathy, hyperlipidemia, and type 2 DM (recent A1c of 8.7%).  Patient reports living alone and cooking for herself. She reports having her gallbladder removed in the past and continuing to have issues with fat digestion and the need to be on medication due to this. She has noticed diarrhea with increased fat consumption.   Through most of the visit it was somewhat difficult to provide patient with adequate education/information. Patient seemed to be trying to problem solve out loud/self educate through most of the visit to determine what she needs to focus on from a nutrition standpoint and how she would make that feasible given the resources she has available.   In addition to brief review of carbohydrates, we also reviewed sodium and fat. Patient voiced preference to cut out some foods entirely rather than limit the amount of those food items she is able to have.   Expect good to fair compliance.  Body mass index is 49.75 kg/m. Pt meets criteria for morbid obesity based on current BMI.  Current diet order is Heart Healthy/Carb Modified, patient is consuming approximately 100% of meals at this time. Labs and medications  reviewed. No further nutrition interventions warranted at this time. RD contact information provided. If additional nutrition issues arise, please re-consult RD.     Jarome Matin, MS, RD, LDN, [facility] Inpatient Clinical Dietitian Pager # [phone] After hours/weekend pager # [phone]

## 2019-01-19 NOTE — Plan of Care (Signed)
  Problem: Education: Goal: Knowledge of General Education information will improve Description: Including pain rating scale, medication(s)/side effects and non-pharmacologic comfort measures 01/19/2019 1319 by Audrea Muscat, RN Outcome: Progressing 01/19/2019 1319 by Audrea Muscat, RN Outcome: Progressing   Problem: Health Behavior/Discharge Planning: Goal: Ability to manage health-related needs will improve 01/19/2019 1319 by Audrea Muscat, RN Outcome: Progressing 01/19/2019 1319 by Audrea Muscat, RN Outcome: Progressing   Problem: Clinical Measurements: Goal: Ability to maintain clinical measurements within normal limits will improve 01/19/2019 1319 by Audrea Muscat, RN Outcome: Progressing 01/19/2019 1319 by Audrea Muscat, RN Outcome: Progressing Goal: Will remain free from infection 01/19/2019 1319 by Audrea Muscat, RN Outcome: Progressing 01/19/2019 1319 by Audrea Muscat, RN Outcome: Progressing Goal: Diagnostic test results will improve 01/19/2019 1319 by Audrea Muscat, RN Outcome: Progressing 01/19/2019 1319 by Audrea Muscat, RN Outcome: Progressing Goal: Respiratory complications will improve 01/19/2019 1319 by Audrea Muscat, RN Outcome: Progressing 01/19/2019 1319 by Audrea Muscat, RN Outcome: Progressing Goal: Cardiovascular complication will be avoided 01/19/2019 1319 by Audrea Muscat, RN Outcome: Progressing 01/19/2019 1319 by Audrea Muscat, RN Outcome: Progressing   Problem: Activity: Goal: Risk for activity intolerance will decrease 01/19/2019 1319 by Audrea Muscat, RN Outcome: Progressing 01/19/2019 1319 by Audrea Muscat, RN Outcome: Progressing   Problem: Nutrition: Goal: Adequate nutrition will be maintained 01/19/2019 1319 by Audrea Muscat, RN Outcome: Progressing 01/19/2019 1319 by Audrea Muscat, RN Outcome: Progressing   Problem: Coping: Goal: Level of anxiety will decrease 01/19/2019  1319 by Audrea Muscat, RN Outcome: Progressing 01/19/2019 1319 by Audrea Muscat, RN Outcome: Progressing   Problem: Elimination: Goal: Will not experience complications related to bowel motility 01/19/2019 1319 by Audrea Muscat, RN Outcome: Progressing 01/19/2019 1319 by Audrea Muscat, RN Outcome: Progressing Goal: Will not experience complications related to urinary retention 01/19/2019 1319 by Audrea Muscat, RN Outcome: Progressing 01/19/2019 1319 by Audrea Muscat, RN Outcome: Progressing   Problem: Pain Managment: Goal: General experience of comfort will improve 01/19/2019 1319 by Audrea Muscat, RN Outcome: Progressing 01/19/2019 1319 by Audrea Muscat, RN Outcome: Progressing   Problem: Safety: Goal: Ability to remain free from injury will improve 01/19/2019 1319 by Audrea Muscat, RN Outcome: Progressing 01/19/2019 1319 by Audrea Muscat, RN Outcome: Progressing   Problem: Skin Integrity: Goal: Risk for impaired skin integrity will decrease 01/19/2019 1319 by Audrea Muscat, RN Outcome: Progressing 01/19/2019 1319 by Audrea Muscat, RN Outcome: Progressing

## 2019-01-20 DIAGNOSIS — I10 Essential (primary) hypertension: Secondary | ICD-10-CM

## 2019-01-20 LAB — BASIC METABOLIC PANEL
Anion gap: 10 (ref 5–15)
BUN: 22 mg/dL (ref 8–23)
CO2: 29 mmol/L (ref 22–32)
Calcium: 8.7 mg/dL — ABNORMAL LOW (ref 8.9–10.3)
Chloride: 100 mmol/L (ref 98–111)
Creatinine, Ser: 0.77 mg/dL (ref 0.44–1.00)
GFR calc Af Amer: >60 mL/min (ref >60)
GFR calc non Af Amer: >60 mL/min (ref >60)
Glucose, Bld: 131 mg/dL — ABNORMAL HIGH (ref 70–99)
Potassium: 3.7 mmol/L (ref 3.5–5.1)
Sodium: 139 mmol/L (ref 135–145)

## 2019-01-20 LAB — CBC WITH DIFFERENTIAL/PLATELET
Abs Immature Granulocytes: 0.27 10*3/uL — ABNORMAL HIGH (ref 0.00–0.07)
Basophils Absolute: 0.1 10*3/uL (ref 0.0–0.1)
Basophils Relative: 0 %
Eosinophils Absolute: 0.1 10*3/uL (ref 0.0–0.5)
Eosinophils Relative: 1 %
HCT: 39.7 % (ref 36.0–46.0)
Hemoglobin: 12 g/dL (ref 12.0–15.0)
Immature Granulocytes: 2 %
Lymphocytes Relative: 25 %
Lymphs Abs: 3 10*3/uL (ref 0.7–4.0)
MCH: 27.9 pg (ref 26.0–34.0)
MCHC: 30.2 g/dL (ref 30.0–36.0)
MCV: 92.3 fL (ref 80.0–100.0)
Monocytes Absolute: 0.9 10*3/uL (ref 0.1–1.0)
Monocytes Relative: 7 %
Neutro Abs: 7.8 10*3/uL — ABNORMAL HIGH (ref 1.7–7.7)
Neutrophils Relative %: 65 %
Platelets: 413 10*3/uL — ABNORMAL HIGH (ref 150–400)
RBC: 4.3 MIL/uL (ref 3.87–5.11)
RDW: 14.1 % (ref 11.5–15.5)
WBC: 12.1 10*3/uL — ABNORMAL HIGH (ref 4.0–10.5)
nRBC: 0 % (ref 0.0–0.2)

## 2019-01-20 LAB — GLUCOSE, CAPILLARY
Glucose-Capillary: 102 mg/dL — ABNORMAL HIGH (ref 70–99)
Glucose-Capillary: 260 mg/dL — ABNORMAL HIGH (ref 70–99)

## 2019-01-20 LAB — MAGNESIUM: Magnesium: 2 mg/dL (ref 1.7–2.4)

## 2019-01-20 MED ORDER — SODIUM CHLORIDE 0.9% FLUSH: 10.0000 mL | INTRAVENOUS | Status: DC | PRN

## 2019-01-20 MED ORDER — INSULIN PEN NEEDLE 31G X 5 MM MISC: 12.0000 [IU] | Freq: Three times a day (TID) | 0 refills | Status: AC

## 2019-01-20 MED ORDER — COMBIVENT RESPIMAT 20-100 MCG/ACT IN AERS: 1.0000 | INHALATION_SPRAY | Freq: Four times a day (QID) | RESPIRATORY_TRACT | 1 refills | Status: DC | PRN

## 2019-01-20 MED ORDER — INSULIN GLARGINE 100 UNIT/ML SOLOSTAR PEN: 34.0000 [IU] | PEN_INJECTOR | Freq: Every day | SUBCUTANEOUS | 1 refills | Status: DC

## 2019-01-20 MED ORDER — FOLIC ACID 1 MG PO TABS: 1.0000 mg | ORAL_TABLET | Freq: Every day | ORAL | Status: DC

## 2019-01-20 MED ORDER — BLOOD GLUCOSE METER KIT: PACK | 0 refills | Status: AC

## 2019-01-20 MED ORDER — PREDNISONE 20 MG PO TABS: 40.0000 mg | ORAL_TABLET | Freq: Every day | ORAL | 0 refills | Status: AC

## 2019-01-20 MED ORDER — INSULIN ASPART 100 UNIT/ML FLEXPEN: 12.0000 [IU] | PEN_INJECTOR | Freq: Three times a day (TID) | SUBCUTANEOUS | 1 refills | Status: DC

## 2019-01-20 MED ORDER — ASPIRIN 81 MG PO TBEC: 81.0000 mg | DELAYED_RELEASE_TABLET | Freq: Every day | ORAL | Status: DC

## 2019-01-20 NOTE — Discharge Summary (Signed)
Physician Discharge Summary  Karen Rasmussen IHK:[id number] DOB: 16-Jun-1945 DOA: 01/12/2019  PCP: Kathyrn Lass, MD  Admit date: 01/12/2019 Discharge date: 01/20/2019  Time spent: 65 minutes  Recommendations for Outpatient Follow-up:  1. Follow-up with Kathyrn Lass, MD in 2 weeks.  On follow-up patient's diabetes will need to be reassessed as patient's oral hypoglycemic agents have been discontinued and patient started on insulin.  Patient will need a basic metabolic profile done to follow-up on electrolytes and renal function, as well as a CBC. 2. Follow-up with urology, Dr. Claudia Desanctis in 3 weeks. 3. Follow-up with Dr. Fransico Him, cardiology. 4. Patient will be discharged home with home health RN and IV ertapenem to complete a 10-day course of antibiotic treatment.   Discharge Diagnoses:  Principal Problem:   Acute pyelonephritis Active Problems:   UTI due to extended-spectrum beta lactamase (ESBL) producing Escherichia coli   Abdominal pain, left lower quadrant   OSA (obstructive sleep apnea)   GERD (gastroesophageal reflux disease)   Morbid obesity with BMI of 40.0-44.9, adult (HCC)   Chronic diastolic CHF (congestive heart failure) (HCC)   Benign essential HTN   Persistent atrial fibrillation (HCC)   DCM (dilated cardiomyopathy) (HCC)   Hypokalemia   Hyperosmolar hyperglycemic state (HHS) ([place])   Acute renal injury ([place])   Urinary tract infection without hematuria   Elevated troponin   Left nephrolithiasis   Discharge Condition: Stable and improved  Diet recommendation: Carb modified diet  Filed Weights   01/13/19 0013 01/20/19 0724  Weight: 123.4 kg 123.3 kg    History of present illness:  HPI per Dr. Allen Norris is a 73 y.o. female with medical history significant for diabetes mellitus, orbit obesity sleep apnea hyperlipidemia chronic diastolic heart failure with last ejection fraction being 45 to 50% who presented with generalized weakness.  It is moderate has  gradually worsened is worse than any time before.  It is not relieved or exacerbated by any elements. ED Course: She has been gently hydrated.  And seems to be getting a little stronger according to her own statement  Hospital Course:  1 acute pyelonephritis secondary to ESBL E. coli UTI in the setting of obstructing left ureteral nephrolithiasis status post left ureteral stenting Patient had presented with abdominal pain, noted to have left flank tenderness.  Urinalysis consistent with UTI.  Patient noted to have a leukocytosis.  Blood cultures ordered with no growth to date.  Urine cultures which were done with 10,000 colonies of of ESBL E. coli however cultures were not obtained until antibiotics were started.  Patient was initially on IV Rocephin and antibiotics coverage broadened to IV meropenem. CT abdomen and pelvis which was done on admission that showed obstructing calculus at the left ureteral pelvic junction with mild left hydronephrosis as well as renal edema along the left nephrolithiasis and left colon diverticulosis without evidence of diverticulitis.  Urology consulted and patient underwent cystoscopy with left ureteral stent placement with eventual staging uteroscopic laser lithotripsy for definite stone management in approximately 3 weeks per urology.  Patient improved clinically and slowly during the hospitalization.  Patient maintained on IV Merrem and subsequently transition to IV ertapenem and discharged home on IV ertapenem to complete a 10-day course of antibiotic treatment.  Outpatient follow-up with PCP and urology.    2.  Hypokalemia Repleted.   3.  Persistent atrial fibrillation Patient remained in sinus rhythm.  Patient was maintained on Coreg and sotalol.  Xarelto for anticoagulation.  Electrolytes were repleted.  Outpatient follow-up with cardiology.    4.  Elevated troponin Felt secondary to a demand ischemia from problem #1.  Patient seen in consultation by  cardiology.  No further cardiac work-up needed at this time.  5.  Uncontrolled diabetes mellitus type 2 Hemoglobin A1c 8.7.  Oral hypoglycemic agents were held and discontinued during this hospitalization. On admission patient noted to have a elevated blood glucose level of 465.    Patient was maintained on Lantus which was uptitrated to 34 units daily as well as NovoLog 10 units 3 times daily with meals.  Blood sugars were better controlled on this regimen.  Patient be discharged home on Lantus Solostar pen 34 units daily, NovoLog FlexPen 12 units 3 times daily with meals.  Patient's oral hypoglycemic agents have been discontinued during this hospitalization.  Patient was also seen by the diabetic coordinator.  Outpatient follow-up with PCP.  6.  Dilated cardiomyopathy/history of chronic diastolic CHF Remained stable.  Patient was maintained on carvedilol and spironolactone.  Spironolactone was initially held on admission and subsequently been resumed.  Outpatient follow-up.   7.  Wheezing Noted on examination on 01/17/2019.    Patient was given IV Solu-Medrol and subsequently transition to oral prednisone with improvement.  Patient was also maintained on scheduled duo nebs.  Patient be discharged home on a steroid taper and Combivent.  Outpatient follow-up with PCP.    8.  Acute kidney injury on chronic kidney disease stage IIIb/metabolic acidosis Improved.  Spironolactone initially held but has been resumed.  IV fluids discontinued.  Avoid nephrotoxic agents.  Follow.  9.  Generalized weakness Likely secondary to acute infection.  Patient seen by PT/ OT.  10.  Hyperlipidemia Patient maintained on statin.    11.  Hyponatremia Resolved.  12.  Hypophosphatemia Repleted.   13.  Dysphagia Patient has been seen by speech therapy.  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  Speech therapy recommending  continue current diet as tolerated given patient's awareness for her dysphagia and education regarding compensation strategies.  Outpatient follow-up with GI.  14.  OSA CPAP nightly.   15.  GERD Patient was maintained on a PPI throughout the hospitalization.   16.  Morbid obesity  Procedures:  CT abdomen and pelvis 01/13/2019  Chest x-ray 01/12/2019, 01/15/2019  VQ scan 01/13/2019  Cystoscopy with left ureteral stent placement per Dr. Claudia Desanctis urology 01/14/2019  Midline 01/20/2019   Consultations:  Urology: Dr. Claudia Desanctis 01/14/2019  Cardiology: Dr. Caryl Comes 01/13/2019  Discharge Exam: Vitals:   01/20/19 0635 01/20/19 0758  BP: 101/63   Pulse: (!) 57 61  Resp: 18 18  Temp: (!) 97.3 F (36.3 C)   SpO2: 94% 95%    General: NAD Cardiovascular: RRR Respiratory: CTAB  Discharge Instructions   Discharge Instructions    Diet Carb Modified   Complete by: As directed    Home infusion instructions [facility] May follow [place] Dosing Protocol; May administer Cathflo as needed to maintain patency of vascular access device.; Flushing of vascular access device: per [facility] Protocol: 0.9% NaCl pre/post medica...   Complete by: As directed    Instructions: May follow [place] Dosing Protocol   Instructions: May administer Cathflo as needed to maintain patency of vascular access device.   Instructions: Flushing of vascular access device: per [facility] Protocol: 0.9% NaCl pre/post medication administration and prn patency; Heparin 100 u/ml, 45m for implanted ports and Heparin 10u/ml, 530m  for all other central venous catheters.   Instructions: May follow [facility] Anaphylaxis Protocol for First Dose Administration in the home: 0.9% NaCl at 25-50 ml/hr to maintain IV access for protocol meds. Epinephrine 0.3 ml IV/IM PRN and Benadryl 25-50 IV/IM PRN s/s of anaphylaxis.   Instructions: [facility] Infusion Coordinator (RN) to assist per patient IV care needs in the home PRN.   Increase  activity slowly   Complete by: As directed      Allergies as of 01/20/2019      Reactions   No [employer]alt[employer] Food Allergies    Defibrillator pads cause rash.      Medication List    STOP taking these medications   glimepiride 4 MG tablet Commonly known as: AMARYL   metFORMIN 500 MG tablet Commonly known as: GLUCOPHAGE     TAKE these medications   acetaminophen 650 MG CR tablet Commonly known as: TYLENOL Take 650 mg by mouth 2 (two) times daily as needed for pain.   alendronate 70 MG tablet Commonly known as: FOSAMAX Take 70 mg by mouth every Sunday. Take with a full glass of water on an empty stomach.   aspirin 81 MG EC tablet Take 1 tablet (81 mg total) by mouth daily. Start taking on: January 21, 2019   blood glucose meter kit and supplies Dispense based on patient and insurance preference. Use up to four times daily as directed. (FOR ICD-10 E10.9, E11.9).   CALCIUM PO Take 650 mg by mouth daily.   carvedilol 3.125 MG tablet Commonly known as: COREG Take 1 tablet (3.125 mg total) by mouth 2 (two) times daily with a meal.   Centrum Silver Chew Chew 2 tablets by mouth daily. soft chews   Combivent Respimat 20-100 MCG/ACT Aers respimat Generic drug: Ipratropium-Albuterol Inhale 1 puff into the lungs every 6 (six) hours as needed for wheezing or shortness of breath. Use 3 times daily x 5 days, then every 6 hours as needed.   diphenhydrAMINE-zinc acetate cream Commonly known as: BENADRYL Apply 1 application topically 3 (three) times daily as needed for itching.   ertapenem  IVPB Commonly known as: INVANZ Inject 1 g into the vein daily for 6 days. Indication:  ESBL UTI Last Day of Therapy:  01/24/2019 Labs - Once weekly:  CBC/D and BMP   folic acid 1 MG tablet Commonly known as: FOLVITE Take 1 tablet (1 mg total) by mouth daily. Start taking on: January 21, 2019   Glucosamine Chondroitin Triple Tabs Take 2 tablets by mouth daily.   HAIR SKIN & NAILS  GUMMIES PO Take 2 tablets by mouth daily.   HYDROcodone-acetaminophen 5-325 MG tablet Commonly known as: NORCO/VICODIN Take 1 tablet by mouth every 8 (eight) hours as needed for moderate pain.   insulin aspart 100 UNIT/ML FlexPen Commonly known as: NOVOLOG Inject 12 Units into the skin 3 (three) times daily with meals.   Insulin Glargine 100 UNIT/ML Solostar Pen Commonly known as: LANTUS Inject 34 Units into the skin daily.   Insulin Pen Needle 31G X 5 MM Misc 12 Units by Does not apply route 3 (three) times daily.   pantoprazole 40 MG tablet Commonly known as: PROTONIX Take 40 mg by mouth daily.   potassium chloride 10 MEQ tablet Commonly known as: KLOR-CON TAKE 2 TABLETS BY MOUTH TWICE DAILY What changed:   how much to take  how to take this  when to take this  additional instructions   predniSONE 20 MG tablet Commonly known as:  DELTASONE Take 2 tablets (40 mg total) by mouth daily before breakfast for 4 days. Start taking on: January 21, 2019   psyllium 28 % packet Commonly known as: METAMUCIL SMOOTH TEXTURE Take 1 packet by mouth 2 (two) times daily.   rivaroxaban 20 MG Tabs tablet Commonly known as: Xarelto Take 1 tablet (20 mg total) by mouth every evening.   simvastatin 10 MG tablet Commonly known as: ZOCOR Take 1 tablet (10 mg total) by mouth at bedtime.   sotalol 80 MG tablet Commonly known as: BETAPACE TAKE 1 TABLET(80 MG) BY MOUTH EVERY 12 HOURS What changed:   how much to take  how to take this  when to take this  additional instructions   spironolactone 25 MG tablet Commonly known as: ALDACTONE TAKE 1/2 TABLET (12.5MG    TOTAL) DAILY What changed: See the new instructions.   tamsulosin 0.4 MG Caps capsule Commonly known as: FLOMAX Take 0.4 mg by mouth daily.   triamcinolone cream 0.1 % Commonly known as: KENALOG Apply 1 application topically 2 (two) times daily as needed (itching legs).   Vitamin D3 125 MCG (5000 UT)  Caps Take 5,000 Units by mouth daily.            Home Infusion Instuctions  (From admission, onward)         Start     Ordered   01/19/19 0000  Home infusion instructions [facility] May follow [place] Dosing Protocol; May administer Cathflo as needed to maintain patency of vascular access device.; Flushing of vascular access device: per [facility] Protocol: 0.9% NaCl pre/post medica...    Question Answer Comment  Instructions May follow [place] Dosing Protocol   Instructions May administer Cathflo as needed to maintain patency of vascular access device.   Instructions Flushing of vascular access device: per [facility] Protocol: 0.9% NaCl pre/post medication administration and prn patency; Heparin 100 u/ml, 34m for implanted ports and Heparin 10u/ml, 525mfor all other central venous catheters.   Instructions May follow [facility] Anaphylaxis Protocol for First Dose Administration in the home: 0.9% NaCl at 25-50 ml/hr to maintain IV access for protocol meds. Epinephrine 0.3 ml IV/IM PRN and Benadryl 25-50 IV/IM PRN s/s of anaphylaxis.   Instructions [facility] Infusion Coordinator (RN) to assist per patient IV care needs in the home PRN.      01/19/19 10[place](From admission, onward)         Start     Ordered   01/18/19 1231  For home use only DME Walker rolling  Once    Comments: [employer]ariat[employer] rolling walker  Question:  Patient needs a walker to treat with the following condition  Answer:  Debility   01/18/19 1231         Allergies  Allergen Reactions  . No [employer]alt[employer] Food Allergies     Defibrillator pads cause rash.   Follow-up Information    [facility] Follow up.   Why: Bariatric Youth rolling walker Contact information: [phone]        [facility] Follow up.   Why: iv abx-supplies       [facility]   Specialty: [place]  HH nursing-instruction iv abx. Contact information: [phone]        MiKathyrn LassMD. Schedule an appointment  as soon as possible for a visit in 2 week(s).   Specialty: Family Medicine Contact information: [place] [address] [phone]        [name], MD .   Specialty: Cardiology Contact information: [phone] [address] [place] [address] [phone]        Ronnette Juniper, MD Follow up.   Specialty: Gastroenterology Why: f/u as scheduled Contact information: [place] [address] [phone]        Robley Fries, MD. Schedule an appointment as soon as possible for a visit in 3 week(s).   Specialty: Urology Contact information: [place] [address] [place] [address] [phone]            The results of significant diagnostics from this hospitalization (including imaging, microbiology, ancillary and laboratory) are listed below for reference.    Significant Diagnostic Studies: CT ABDOMEN PELVIS WO CONTRAST  Result Date: 01/13/2019 CLINICAL DATA:  Weakness of fevers. EXAM: CT ABDOMEN AND PELVIS WITHOUT CONTRAST TECHNIQUE: Multidetector CT imaging of the abdomen and pelvis was performed following the standard protocol without IV contrast. COMPARISON:  CT 03/12/2011 FINDINGS: Lower chest: Lung bases are clear. Hepatobiliary: No focal hepatic lesion. Postcholecystectomy. No biliary dilatation. Pancreas: Pancreas is normal. No ductal dilatation. No pancreatic inflammation. Spleen: Normal spleen Adrenals/urinary tract: Adrenal glands normal. Mild hydronephrosis of the LEFT kidney secondary to obstructing calculus at the LEFT ureteropelvic junction. The calculus measures 7 mm (image 34/2). There is mild renal edema on the LEFT. Two additional nonobstructing calculi in lower pole of the LEFT kidney. No distal LEFT ureterolithiasis. No bladder  calculi. The RIGHT kidney is stone free. No RIGHT ureterolithiasis. Stomach/Bowel: Stomach, small-bowel and cecum are normal. The appendix is not identified but there is no pericecal inflammation to suggest appendicitis. Multiple diverticula of the descending colon and sigmoid colon without acute inflammation. Vascular/Lymphatic: Abdominal aorta is normal caliber. No periportal or retroperitoneal adenopathy. No pelvic adenopathy. Reproductive: Post hysterectomy Other: No free fluid. Musculoskeletal: No aggressive osseous lesion. IMPRESSION: 1. Obstructing calculus at the LEFT ureteropelvic junction. Mild LEFT hydronephrosis and renal edema. 2. LEFT nephrolithiasis. 3. LEFT colon diverticulosis without evidence diverticulitis. Electronically Signed   By: Suzy Bouchard M.D.   On: 01/13/2019 10:29   NM Pulmonary Perfusion  Result Date: 01/13/2019 CLINICAL DATA:  Shortness of breath. EXAM: NUCLEAR MEDICINE PERFUSION LUNG SCAN TECHNIQUE: Perfusion images were obtained in multiple projections after intravenous injection of radiopharmaceutical. Ventilation scans intentionally deferred if perfusion scan and chest x-ray adequate for interpretation during COVID 19 epidemic. RADIOPHARMACEUTICALS:  1.5 mCi Tc-102mMAA IV COMPARISON:  Chest x-ray January 12, 2019 FINDINGS: No segmental defects to suggest pulmonary emboli. IMPRESSION: No evidence of pulmonary embolus on this study. Electronically Signed   By: DDorise BullionIII M.D   On: 01/13/2019 02:32   DG CHEST PORT 1 VIEW  Result Date: 01/15/2019 CLINICAL DATA:  Shortness of breath EXAM: PORTABLE CHEST 1 VIEW COMPARISON:  01/12/2019 FINDINGS: Lungs are clear.  No pleural effusion or pneumothorax. The heart is mildly enlarged. IMPRESSION: No evidence of acute cardiopulmonary disease. Electronically Signed   By: SJulian HyM.D.   On: 01/15/2019 19:10   DG Chest Portable 1 View  Result Date: 01/12/2019 CLINICAL DATA:  Weakness.  Shortness of breath. EXAM:  PORTABLE CHEST 1 VIEW COMPARISON:  November 08, 2010 FINDINGS: Cardiomegaly. The hila and mediastinum are normal. No pneumothorax. Haziness over the bases may simply be  due to overlapping soft tissues and patient body habitus. No definitive infiltrate identified. No over pulmonary edema noted. IMPRESSION: The study is limited due to patient body habitus. Within this limitation, no focal infiltrates or overt edema are identified. Electronically Signed   By: Dorise Bullion III M.D   On: 01/12/2019 23:46   DG C-Arm 1-60 Min-No Report  Result Date: 01/14/2019 Fluoroscopy was utilized by the requesting physician.  No radiographic interpretation.   VAS [place] LE ART SEG MULTI (Segm&LE Reynauds)  Result Date: 01/03/2019 LOWER EXTREMITY DOPPLER STUDY Indications: Decreased pedal pulses. High Risk Factors: Hypertension, no history of smoking. Other Factors: Patient denies any claudication symptoms and no rest pain.  Performing Technologist: Wilkie Aye RVT  Examination Guidelines: A complete evaluation includes at minimum, Doppler waveform signals and systolic blood pressure reading at the level of bilateral brachial, anterior tibial, and posterior tibial arteries, when vessel segments are accessible. Bilateral testing is considered an integral part of a complete examination. Photoelectric Plethysmograph (PPG) waveforms and toe systolic pressure readings are included as required and additional duplex testing as needed. Limited examinations for reoccurring indications may be performed as noted.  ABI Findings: +---------+------------------+-----+---------+--------+ Right    Rt Pressure (mmHg)IndexWaveform Comment  +---------+------------------+-----+---------+--------+ Brachial 138                                      +---------+------------------+-----+---------+--------+ CFA                             triphasic         +---------+------------------+-----+---------+--------+ Popliteal                        triphasic         +---------+------------------+-----+---------+--------+ ATA      141               0.99 triphasic         +---------+------------------+-----+---------+--------+ PTA      166               1.16 triphasic         +---------+------------------+-----+---------+--------+ PERO     157               1.10 triphasic         +---------+------------------+-----+---------+--------+ Great Toe94                0.66 Normal            +---------+------------------+-----+---------+--------+ +---------+------------------+-----+---------+-------+ Left     Lt Pressure (mmHg)IndexWaveform Comment +---------+------------------+-----+---------+-------+ Brachial 143                                     +---------+------------------+-----+---------+-------+ CFA                             triphasic        +---------+------------------+-----+---------+-------+ Popliteal                       triphasic        +---------+------------------+-----+---------+-------+ ATA      142               0.99 triphasic        +---------+------------------+-----+---------+-------+  PTA      159               1.11 triphasic        +---------+------------------+-----+---------+-------+ PERO     153               1.07 triphasic        +---------+------------------+-----+---------+-------+ Great Toe87                0.61 Normal           +---------+------------------+-----+---------+-------+ +-------+-----------+-----------+ ABI/TBIToday's ABIToday's TBI +-------+-----------+-----------+ Right  1.16       0.66        +-------+-----------+-----------+ Left   1.11       0.61        +-------+-----------+-----------+  Summary: Right: Resting right ankle-brachial index is within normal range. No evidence of significant right lower extremity arterial disease. The right toe-brachial index is abnormal. Left: Resting left ankle-brachial index is within normal  range. No evidence of significant left lower extremity arterial disease. The left toe-brachial index is abnormal.  *See table(s) above for measurements and observations.  Electronically signed by Kathlyn Sacramento MD on 01/03/2019 at 2:51:57 PM.    Final    [place] EKG SITE RITE  Result Date: 01/18/2019 If Site Rite image not attached, placement could not be confirmed due to current cardiac rhythm.   Microbiology: Recent Results (from the past 240 hour(s))  SARS CORONAVIRUS 2 (TAT 6-24 HRS) Nasopharyngeal Nasopharyngeal Swab     Status: None   Collection Time: 01/13/19 12:24 AM   Specimen: Nasopharyngeal Swab  Result Value Ref Range Status   SARS Coronavirus 2 NEGATIVE NEGATIVE Final    Comment: (NOTE) SARS-CoV-2 target nucleic acids are NOT DETECTED. The SARS-CoV-2 RNA is generally detectable in upper and lower respiratory specimens during the acute phase of infection. Negative results do not preclude SARS-CoV-2 infection, do not rule out co-infections with other pathogens, and should not be used as the sole basis for treatment or other patient management decisions. Negative results must be combined with clinical observations, patient history, and epidemiological information. The expected result is Negative. Fact Sheet for Patients: SugarRoll.be Fact Sheet for Healthcare Providers: https://www.woods-mathews.com/ This test is not yet approved or cleared by the [place] FDA and  has been authorized for detection and/or diagnosis of SARS-CoV-2 by FDA under an Emergency Use Authorization (EUA). This EUA will remain  in effect (meaning this test can be used) for the duration of the COVID-19 declaration under Section 56 4(b)(1) of the Act, 21 U.S.C. section 360bbb-3(b)(1), unless the authorization is terminated or revoked sooner. Performed at [facility], [place] [address], [place], [place] [address]   Blood culture (routine x 2)     Status:  None   Collection Time: 01/13/19 12:24 AM   Specimen: BLOOD  Result Value Ref Range Status   Specimen Description   Final    BLOOD RIGHT ANTECUBITAL Performed at [facility] [address] [place], [place] [address]    Special Requests   Final    BOTTLES DRAWN AEROBIC AND ANAEROBIC Blood Culture adequate volume Performed at [facility] [address], [place], [place] [address]    Culture   Final    NO GROWTH 5 DAYS Performed at [facility], [place] [address], [place], [place] [address]    Report Status 01/18/2019 FINAL  Final  Blood culture (routine x 2)     Status: None  Collection Time: 01/13/19  2:23 AM   Specimen: BLOOD  Result Value Ref Range Status   Specimen Description   Final    BLOOD LEFT ANTECUBITAL Performed at [facility] [address], [place], [place] [address]    Special Requests   Final    BOTTLES DRAWN AEROBIC AND ANAEROBIC Blood Culture adequate volume Performed at [facility] [address], [place], [place] [address]    Culture   Final    NO GROWTH 5 DAYS Performed at [facility], [place] [address], [place], [place] [address]    Report Status 01/18/2019 FINAL  Final  Culture, Urine     Status: Abnormal   Collection Time: 01/13/19  8:04 AM   Specimen: Urine, Clean Catch  Result Value Ref Range Status   Specimen Description   Final    URINE, CLEAN CATCH Performed at [facility], [place] [address] [place], [place] [address]    Special Requests   Final    NONE Performed at [facility], [place] [address], [place], [place] [address]    Culture (A)  Final    10,000 COLONIES/mL ESCHERICHIA COLI Confirmed Extended Spectrum Beta-Lactamase Producer (ESBL).  In bloodstream infections from ESBL organisms, carbapenems are preferred over piperacillin/tazobactam. They are shown to have a lower risk of mortality.    Report Status  01/15/2019 FINAL  Final   Organism ID, Bacteria ESCHERICHIA COLI (A)  Final      Susceptibility   Escherichia coli - MIC*    AMPICILLIN >=32 RESISTANT Resistant     CEFAZOLIN >=64 RESISTANT Resistant     CEFTRIAXONE RESISTANT Resistant     CIPROFLOXACIN <=0.25 SENSITIVE Sensitive     GENTAMICIN <=1 SENSITIVE Sensitive     IMIPENEM 0.5 SENSITIVE Sensitive     NITROFURANTOIN 64 INTERMEDIATE Intermediate     TRIMETH/SULFA <=20 SENSITIVE Sensitive     AMPICILLIN/SULBACTAM >=32 RESISTANT Resistant     Extended ESBL POSITIVE Resistant     * 10,000 COLONIES/mL ESCHERICHIA COLI  MRSA PCR Screening     Status: None   Collection Time: 01/14/19  5:10 PM   Specimen: Nasal Mucosa; Nasopharyngeal  Result Value Ref Range Status   MRSA by PCR NEGATIVE NEGATIVE Final    Comment:        The GeneXpert MRSA Assay (FDA approved for NASAL specimens only), is one component of a comprehensive MRSA colonization surveillance program. It is not intended to diagnose MRSA infection nor to guide or monitor treatment for MRSA infections. Performed at [facility], [place] [address], [place], [place] [address]      Labs: Basic Metabolic Panel: Recent Labs  Lab 01/14/19 0537 01/15/19 0428 01/16/19 0528 01/17/19 0522 01/18/19 0538 01/19/19 0501 01/20/19 0438  NA 136 138 139 141 138 141 139  K 4.5 4.3 4.1 3.9 4.0 3.7 3.7  CL 105 105 106 106 102 105 100  CO2 20* 22 23 26 27 27 29   GLUCOSE 203* 270* 227* 175* 248* 101* 131*  BUN 21 21 23 21 20 22 22   CREATININE 1.43* 1.26* 1.02* 0.91 0.78 0.77 0.77  CALCIUM 8.2* 8.4* 8.3* 9.0 8.7* 8.7* 8.7*  MG 2.3 2.4 2.2 1.9 1.9  --  2.0  PHOS 2.4* 4.4 3.0 4.5 3.6  --   --    Liver Function Tests: Recent Labs  Lab 01/14/19 0537 01/15/19 0428 01/16/19 0528 01/17/19 0522  AST 14* 14* 15 16  ALT 24  27 25 26   ALKPHOS 98 105 90 81  BILITOT 1.0 0.5 1.0 0.5  PROT 6.1* 6.1* 6.0* 5.6*  ALBUMIN 2.5* 2.4* 2.5* 2.3*   No results for input(s):  LIPASE, AMYLASE in the last 168 hours. No results for input(s): AMMONIA in the last 168 hours. CBC: Recent Labs  Lab 01/16/19 0528 01/17/19 0522 01/18/19 0538 01/19/19 0501 01/20/19 0438  WBC 13.1* 9.7 12.2* 10.5 12.1*  NEUTROABS 8.6* 5.3 9.5* 5.5 7.8*  HGB 10.6* 11.4* 11.9* 11.3* 12.0  HCT 35.3* 38.3 38.8 38.0 39.7  MCV 92.2 93.2 91.7 92.5 92.3  PLT 330 343 389 404* 413*   Cardiac Enzymes: No results for input(s): CKTOTAL, CKMB, CKMBINDEX, TROPONINI in the last 168 hours. BNP: BNP (last 3 results) Recent Labs    01/13/19 0542  BNP 242.1*    ProBNP (last 3 results) No results for input(s): PROBNP in the last 8760 hours.  CBG: Recent Labs  Lab 01/19/19 1131 01/19/19 1642 01/19/19 2115 01/20/19 0737 01/20/19 1124  GLUCAP 102* 242* 253* 102* 260*       Signed:  Irine Seal MD.  [facility] 01/20/2019, 11:36 AM

## 2019-01-21 DIAGNOSIS — E785 Hyperlipidemia, unspecified: Secondary | ICD-10-CM | POA: Diagnosis not present

## 2019-01-21 DIAGNOSIS — Z1612 Extended spectrum beta lactamase (ESBL) resistance: Secondary | ICD-10-CM | POA: Diagnosis not present

## 2019-01-21 DIAGNOSIS — J45909 Unspecified asthma, uncomplicated: Secondary | ICD-10-CM | POA: Diagnosis not present

## 2019-01-21 DIAGNOSIS — I4819 Other persistent atrial fibrillation: Secondary | ICD-10-CM | POA: Diagnosis not present

## 2019-01-21 DIAGNOSIS — B9629 Other Escherichia coli [E. coli] as the cause of diseases classified elsewhere: Secondary | ICD-10-CM | POA: Diagnosis not present

## 2019-01-21 DIAGNOSIS — N10 Acute tubulo-interstitial nephritis: Secondary | ICD-10-CM | POA: Diagnosis not present

## 2019-01-21 DIAGNOSIS — N1832 Chronic kidney disease, stage 3b: Secondary | ICD-10-CM | POA: Diagnosis not present

## 2019-01-21 DIAGNOSIS — K449 Diaphragmatic hernia without obstruction or gangrene: Secondary | ICD-10-CM | POA: Diagnosis not present

## 2019-01-21 DIAGNOSIS — M170 Bilateral primary osteoarthritis of knee: Secondary | ICD-10-CM | POA: Diagnosis not present

## 2019-01-21 DIAGNOSIS — I420 Dilated cardiomyopathy: Secondary | ICD-10-CM | POA: Diagnosis not present

## 2019-01-21 DIAGNOSIS — E1122 Type 2 diabetes mellitus with diabetic chronic kidney disease: Secondary | ICD-10-CM | POA: Diagnosis not present

## 2019-01-21 DIAGNOSIS — R1310 Dysphagia, unspecified: Secondary | ICD-10-CM | POA: Diagnosis not present

## 2019-01-21 DIAGNOSIS — R011 Cardiac murmur, unspecified: Secondary | ICD-10-CM | POA: Diagnosis not present

## 2019-01-21 DIAGNOSIS — K5730 Diverticulosis of large intestine without perforation or abscess without bleeding: Secondary | ICD-10-CM | POA: Diagnosis not present

## 2019-01-21 DIAGNOSIS — N136 Pyonephrosis: Secondary | ICD-10-CM | POA: Diagnosis not present

## 2019-01-21 DIAGNOSIS — E1165 Type 2 diabetes mellitus with hyperglycemia: Secondary | ICD-10-CM | POA: Diagnosis not present

## 2019-01-21 DIAGNOSIS — K222 Esophageal obstruction: Secondary | ICD-10-CM | POA: Diagnosis not present

## 2019-01-21 DIAGNOSIS — I110 Hypertensive heart disease with heart failure: Secondary | ICD-10-CM | POA: Diagnosis not present

## 2019-01-21 DIAGNOSIS — G4733 Obstructive sleep apnea (adult) (pediatric): Secondary | ICD-10-CM | POA: Diagnosis not present

## 2019-01-21 DIAGNOSIS — Z452 Encounter for adjustment and management of vascular access device: Secondary | ICD-10-CM | POA: Diagnosis not present

## 2019-01-21 DIAGNOSIS — E119 Type 2 diabetes mellitus without complications: Secondary | ICD-10-CM | POA: Diagnosis not present

## 2019-01-21 DIAGNOSIS — M7070 Other bursitis of hip, unspecified hip: Secondary | ICD-10-CM | POA: Diagnosis not present

## 2019-01-21 DIAGNOSIS — I5032 Chronic diastolic (congestive) heart failure: Secondary | ICD-10-CM | POA: Diagnosis not present

## 2019-01-21 DIAGNOSIS — M160 Bilateral primary osteoarthritis of hip: Secondary | ICD-10-CM | POA: Diagnosis not present

## 2019-01-22 DIAGNOSIS — B9629 Other Escherichia coli [E. coli] as the cause of diseases classified elsewhere: Secondary | ICD-10-CM | POA: Diagnosis not present

## 2019-01-22 DIAGNOSIS — N390 Urinary tract infection, site not specified: Secondary | ICD-10-CM | POA: Diagnosis not present

## 2019-01-22 DIAGNOSIS — Z452 Encounter for adjustment and management of vascular access device: Secondary | ICD-10-CM | POA: Diagnosis not present

## 2019-01-22 DIAGNOSIS — N10 Acute tubulo-interstitial nephritis: Secondary | ICD-10-CM | POA: Diagnosis not present

## 2019-01-22 DIAGNOSIS — N136 Pyonephrosis: Secondary | ICD-10-CM | POA: Diagnosis not present

## 2019-01-22 DIAGNOSIS — E1165 Type 2 diabetes mellitus with hyperglycemia: Secondary | ICD-10-CM | POA: Diagnosis not present

## 2019-01-22 DIAGNOSIS — Z1612 Extended spectrum beta lactamase (ESBL) resistance: Secondary | ICD-10-CM | POA: Diagnosis not present

## 2019-01-23 ENCOUNTER — Telehealth: Payer: Self-pay | Admitting: Cardiology

## 2019-01-23 DIAGNOSIS — Z452 Encounter for adjustment and management of vascular access device: Secondary | ICD-10-CM | POA: Diagnosis not present

## 2019-01-23 DIAGNOSIS — N136 Pyonephrosis: Secondary | ICD-10-CM | POA: Diagnosis not present

## 2019-01-23 DIAGNOSIS — E1165 Type 2 diabetes mellitus with hyperglycemia: Secondary | ICD-10-CM | POA: Diagnosis not present

## 2019-01-23 DIAGNOSIS — Z1612 Extended spectrum beta lactamase (ESBL) resistance: Secondary | ICD-10-CM | POA: Diagnosis not present

## 2019-01-23 DIAGNOSIS — N10 Acute tubulo-interstitial nephritis: Secondary | ICD-10-CM | POA: Diagnosis not present

## 2019-01-23 DIAGNOSIS — B9629 Other Escherichia coli [E. coli] as the cause of diseases classified elsewhere: Secondary | ICD-10-CM | POA: Diagnosis not present

## 2019-01-23 NOTE — Telephone Encounter (Signed)
Patient is calling because she missed taking some of her medications last night (xarelto, simvastatin, invanz, insulin). She is also on a 4 day course of prednisone. She was concerned about going back in to A Fib. She took all her medications as prescribed this morning and I advised her to continue to do so for the remainder of the day. She states that she feels fine and is not having any symptoms of A fib and she will call back if she experiences any.

## 2019-01-23 NOTE — Telephone Encounter (Signed)
New message    Patient wants to know if Prednisone will cause her to go into afib. Patient states she forgot to take all of her medications last night.

## 2019-01-26 DIAGNOSIS — B9629 Other Escherichia coli [E. coli] as the cause of diseases classified elsewhere: Secondary | ICD-10-CM | POA: Diagnosis not present

## 2019-01-26 DIAGNOSIS — Z1612 Extended spectrum beta lactamase (ESBL) resistance: Secondary | ICD-10-CM | POA: Diagnosis not present

## 2019-01-26 DIAGNOSIS — Z452 Encounter for adjustment and management of vascular access device: Secondary | ICD-10-CM | POA: Diagnosis not present

## 2019-01-26 DIAGNOSIS — N10 Acute tubulo-interstitial nephritis: Secondary | ICD-10-CM | POA: Diagnosis not present

## 2019-01-26 DIAGNOSIS — E1165 Type 2 diabetes mellitus with hyperglycemia: Secondary | ICD-10-CM | POA: Diagnosis not present

## 2019-01-26 DIAGNOSIS — N136 Pyonephrosis: Secondary | ICD-10-CM | POA: Diagnosis not present

## 2019-01-30 DIAGNOSIS — B9629 Other Escherichia coli [E. coli] as the cause of diseases classified elsewhere: Secondary | ICD-10-CM | POA: Diagnosis not present

## 2019-01-30 DIAGNOSIS — Z1612 Extended spectrum beta lactamase (ESBL) resistance: Secondary | ICD-10-CM | POA: Diagnosis not present

## 2019-01-30 DIAGNOSIS — Z452 Encounter for adjustment and management of vascular access device: Secondary | ICD-10-CM | POA: Diagnosis not present

## 2019-01-30 DIAGNOSIS — N136 Pyonephrosis: Secondary | ICD-10-CM | POA: Diagnosis not present

## 2019-01-30 DIAGNOSIS — E1165 Type 2 diabetes mellitus with hyperglycemia: Secondary | ICD-10-CM | POA: Diagnosis not present

## 2019-01-30 DIAGNOSIS — N10 Acute tubulo-interstitial nephritis: Secondary | ICD-10-CM | POA: Diagnosis not present

## 2019-01-31 NOTE — Telephone Encounter (Signed)
error 

## 2019-02-06 ENCOUNTER — Ambulatory Visit (HOSPITAL_BASED_OUTPATIENT_CLINIC_OR_DEPARTMENT_OTHER): Admit: 2019-02-06 | Payer: Medicare Other | Admitting: Urology

## 2019-02-06 ENCOUNTER — Encounter (HOSPITAL_BASED_OUTPATIENT_CLINIC_OR_DEPARTMENT_OTHER): Payer: Self-pay

## 2019-02-06 DIAGNOSIS — Z1612 Extended spectrum beta lactamase (ESBL) resistance: Secondary | ICD-10-CM | POA: Diagnosis not present

## 2019-02-06 DIAGNOSIS — N136 Pyonephrosis: Secondary | ICD-10-CM | POA: Diagnosis not present

## 2019-02-06 DIAGNOSIS — E1165 Type 2 diabetes mellitus with hyperglycemia: Secondary | ICD-10-CM | POA: Diagnosis not present

## 2019-02-06 DIAGNOSIS — N10 Acute tubulo-interstitial nephritis: Secondary | ICD-10-CM | POA: Diagnosis not present

## 2019-02-06 DIAGNOSIS — B9629 Other Escherichia coli [E. coli] as the cause of diseases classified elsewhere: Secondary | ICD-10-CM | POA: Diagnosis not present

## 2019-02-06 DIAGNOSIS — Z452 Encounter for adjustment and management of vascular access device: Secondary | ICD-10-CM | POA: Diagnosis not present

## 2019-02-06 SURGERY — CYSTOURETEROSCOPY, WITH RETROGRADE PYELOGRAM AND STENT INSERTION
Anesthesia: General | Laterality: Left

## 2019-02-07 ENCOUNTER — Telehealth: Payer: Self-pay | Admitting: Cardiology

## 2019-02-07 DIAGNOSIS — I5032 Chronic diastolic (congestive) heart failure: Secondary | ICD-10-CM

## 2019-02-07 NOTE — Telephone Encounter (Signed)
Carly  happy new year Pt belongs to [facility]   I saw her jsut in hospital But two thoguths 2) LETS have her take furosemide 40 qod for 3 doses  1)lets get an echo and see what has happened with her LV function  Thanks SK

## 2019-02-07 NOTE — Telephone Encounter (Signed)
Pt c/o swelling: STAT is pt has developed SOB within 24 hours  1) How much weight have you gained and in what time span? Not sure  2) If swelling, where is the swelling located? Legs, swelling more at night.  3) Are you currently taking a fluid pill? yes  4) Are you currently SOB? no  5) Do you have a log of your daily weights (if so, list)? No, varying around 276lbs  6) Have you gained 3 pounds in a day or 5 pounds in a week? No  7) Have you traveled recently? No   Patient states her legs are getting very swollen and would like to know if she should increase her spironolactone (ALDACTONE) 25 MG tablet. She states she is also on insulin.

## 2019-02-07 NOTE — Telephone Encounter (Signed)
Spoke with patient who reports that she has had increased swelling in her legs since she was released from the hospital on 12/12. She reports that she has gained 5-6 pounds since then. She says she has been watching her diet and has been limiting her salt intake. She denies SOB. She states that the swelling is worse at night and the she also has red blotches on her legs at night. She was seen by a vein specialist and doppler was normal. She also reports some tingling. I encouraged the patient to elevate her legs as she states she does not do this often. Patient takes 12.5 mg of spironolactone daily and is wondering if she should be taking 25 mg daily.

## 2019-02-08 MED ORDER — FUROSEMIDE 40 MG PO TABS: 40.0000 mg | ORAL_TABLET | ORAL | 1 refills | Status: DC

## 2019-02-08 NOTE — Telephone Encounter (Signed)
Spoke with patient and gave her Dr. Marlou Porch recommendations. She will start with 3 doses of furosemide 40 mg every other day on Saturday. Echocardiogram has been scheduled. Patient verbalized understanding and will call back if symptoms worsen.

## 2019-02-13 ENCOUNTER — Telehealth: Payer: Self-pay | Admitting: Cardiology

## 2019-02-13 ENCOUNTER — Other Ambulatory Visit: Payer: Self-pay | Admitting: Urology

## 2019-02-13 DIAGNOSIS — E1165 Type 2 diabetes mellitus with hyperglycemia: Secondary | ICD-10-CM | POA: Diagnosis not present

## 2019-02-13 DIAGNOSIS — N136 Pyonephrosis: Secondary | ICD-10-CM | POA: Diagnosis not present

## 2019-02-13 DIAGNOSIS — Z452 Encounter for adjustment and management of vascular access device: Secondary | ICD-10-CM | POA: Diagnosis not present

## 2019-02-13 DIAGNOSIS — Z1612 Extended spectrum beta lactamase (ESBL) resistance: Secondary | ICD-10-CM | POA: Diagnosis not present

## 2019-02-13 DIAGNOSIS — B9629 Other Escherichia coli [E. coli] as the cause of diseases classified elsewhere: Secondary | ICD-10-CM | POA: Diagnosis not present

## 2019-02-13 DIAGNOSIS — N10 Acute tubulo-interstitial nephritis: Secondary | ICD-10-CM | POA: Diagnosis not present

## 2019-02-13 NOTE — Telephone Encounter (Signed)
New Message     [facility] Pre-operative Risk Assessment    Request for surgical clearance:  1. What type of surgery is being performed? Ureteroscopy and Stone Extraction  2. When is this surgery scheduled? 03/06/19   3. What type of clearance is required (medical clearance vs. Pharmacy clearance to hold med vs. Both)? Medical  4. Are there any medications that need to be held prior to surgery and how long? No   5. Practice name and name of physician performing surgery? [facility]; Dr. Claudia Desanctis   6. What is your office phone number [phone]    7.   What is your office fax number [phone]  8.   Anesthesia type (None, local, MAC, general) ? General   Jenkins Rouge 02/13/2019, 1:40 PM  _________________________________________________________________   (provider comments below)

## 2019-02-15 DIAGNOSIS — R8279 Other abnormal findings on microbiological examination of urine: Secondary | ICD-10-CM | POA: Diagnosis not present

## 2019-02-15 DIAGNOSIS — N201 Calculus of ureter: Secondary | ICD-10-CM | POA: Diagnosis not present

## 2019-02-15 DIAGNOSIS — N390 Urinary tract infection, site not specified: Secondary | ICD-10-CM | POA: Diagnosis not present

## 2019-02-19 ENCOUNTER — Encounter: Payer: Medicare Other | Admitting: Family

## 2019-02-19 ENCOUNTER — Encounter (HOSPITAL_COMMUNITY): Payer: Medicare Other

## 2019-02-20 ENCOUNTER — Other Ambulatory Visit (HOSPITAL_COMMUNITY): Payer: Medicare Other

## 2019-02-20 DIAGNOSIS — E119 Type 2 diabetes mellitus without complications: Secondary | ICD-10-CM | POA: Diagnosis not present

## 2019-02-20 DIAGNOSIS — I420 Dilated cardiomyopathy: Secondary | ICD-10-CM | POA: Diagnosis not present

## 2019-02-20 DIAGNOSIS — M7070 Other bursitis of hip, unspecified hip: Secondary | ICD-10-CM | POA: Diagnosis not present

## 2019-02-20 DIAGNOSIS — M160 Bilateral primary osteoarthritis of hip: Secondary | ICD-10-CM | POA: Diagnosis not present

## 2019-02-20 DIAGNOSIS — I110 Hypertensive heart disease with heart failure: Secondary | ICD-10-CM | POA: Diagnosis not present

## 2019-02-20 DIAGNOSIS — M170 Bilateral primary osteoarthritis of knee: Secondary | ICD-10-CM | POA: Diagnosis not present

## 2019-02-20 DIAGNOSIS — K5730 Diverticulosis of large intestine without perforation or abscess without bleeding: Secondary | ICD-10-CM | POA: Diagnosis not present

## 2019-02-20 DIAGNOSIS — R1310 Dysphagia, unspecified: Secondary | ICD-10-CM | POA: Diagnosis not present

## 2019-02-20 DIAGNOSIS — E1122 Type 2 diabetes mellitus with diabetic chronic kidney disease: Secondary | ICD-10-CM | POA: Diagnosis not present

## 2019-02-20 DIAGNOSIS — E785 Hyperlipidemia, unspecified: Secondary | ICD-10-CM | POA: Diagnosis not present

## 2019-02-20 DIAGNOSIS — B9629 Other Escherichia coli [E. coli] as the cause of diseases classified elsewhere: Secondary | ICD-10-CM | POA: Diagnosis not present

## 2019-02-20 DIAGNOSIS — N1832 Chronic kidney disease, stage 3b: Secondary | ICD-10-CM | POA: Diagnosis not present

## 2019-02-20 DIAGNOSIS — I4819 Other persistent atrial fibrillation: Secondary | ICD-10-CM | POA: Diagnosis not present

## 2019-02-20 DIAGNOSIS — N10 Acute tubulo-interstitial nephritis: Secondary | ICD-10-CM | POA: Diagnosis not present

## 2019-02-20 DIAGNOSIS — J45909 Unspecified asthma, uncomplicated: Secondary | ICD-10-CM | POA: Diagnosis not present

## 2019-02-20 DIAGNOSIS — R011 Cardiac murmur, unspecified: Secondary | ICD-10-CM | POA: Diagnosis not present

## 2019-02-20 DIAGNOSIS — K449 Diaphragmatic hernia without obstruction or gangrene: Secondary | ICD-10-CM | POA: Diagnosis not present

## 2019-02-20 DIAGNOSIS — Z1612 Extended spectrum beta lactamase (ESBL) resistance: Secondary | ICD-10-CM | POA: Diagnosis not present

## 2019-02-20 DIAGNOSIS — I5032 Chronic diastolic (congestive) heart failure: Secondary | ICD-10-CM | POA: Diagnosis not present

## 2019-02-20 DIAGNOSIS — G4733 Obstructive sleep apnea (adult) (pediatric): Secondary | ICD-10-CM | POA: Diagnosis not present

## 2019-02-20 DIAGNOSIS — Z452 Encounter for adjustment and management of vascular access device: Secondary | ICD-10-CM | POA: Diagnosis not present

## 2019-02-20 DIAGNOSIS — E1165 Type 2 diabetes mellitus with hyperglycemia: Secondary | ICD-10-CM | POA: Diagnosis not present

## 2019-02-20 DIAGNOSIS — N136 Pyonephrosis: Secondary | ICD-10-CM | POA: Diagnosis not present

## 2019-02-20 DIAGNOSIS — K222 Esophageal obstruction: Secondary | ICD-10-CM | POA: Diagnosis not present

## 2019-02-22 ENCOUNTER — Other Ambulatory Visit: Payer: Self-pay | Admitting: Cardiology

## 2019-02-22 ENCOUNTER — Ambulatory Visit: Payer: Medicare Other

## 2019-02-22 MED ORDER — RIVAROXABAN 20 MG PO TABS: 20.0000 mg | ORAL_TABLET | Freq: Every evening | ORAL | 1 refills | Status: DC

## 2019-02-22 NOTE — Telephone Encounter (Addendum)
Age 74, weight 123kg, SCr 0.77 on 01/20/19, CrCL > 100, last OV Sept 2020, afib indication

## 2019-02-23 ENCOUNTER — Other Ambulatory Visit: Payer: Self-pay

## 2019-02-23 ENCOUNTER — Ambulatory Visit (HOSPITAL_COMMUNITY): Payer: Medicare Other | Attending: Cardiovascular Disease

## 2019-02-23 ENCOUNTER — Telehealth: Payer: Self-pay | Admitting: Cardiology

## 2019-02-23 DIAGNOSIS — I5032 Chronic diastolic (congestive) heart failure: Secondary | ICD-10-CM | POA: Diagnosis not present

## 2019-02-23 DIAGNOSIS — Z79899 Other long term (current) drug therapy: Secondary | ICD-10-CM

## 2019-02-23 DIAGNOSIS — R609 Edema, unspecified: Secondary | ICD-10-CM

## 2019-02-23 MED ORDER — FUROSEMIDE 20 MG PO TABS: ORAL_TABLET | ORAL | 3 refills | Status: DC

## 2019-02-23 MED ORDER — RIVAROXABAN 20 MG PO TABS: 20.0000 mg | ORAL_TABLET | Freq: Every evening | ORAL | 1 refills | Status: DC

## 2019-02-23 NOTE — Telephone Encounter (Signed)
Received fax from [facility] requesting 90 day rx be sent to them.

## 2019-02-23 NOTE — Addendum Note (Signed)
Addended by: Brynda Peon on: 02/23/2019 10:05 AM   Modules accepted: Orders

## 2019-02-23 NOTE — Telephone Encounter (Signed)
Start on Lasix 20mg  daily and can take an additional 20mg  PRN as needed for edema.  Continue spiro and get BMET in 1 week

## 2019-02-23 NOTE — Telephone Encounter (Signed)
lpmtcb 1/15

## 2019-02-23 NOTE — Telephone Encounter (Signed)
I spoke to Karen Rasmussen with Dr Theodosia Blender recommendation to start Lasix 20 mg Daily and labs next Friday 1/22.

## 2019-02-23 NOTE — Telephone Encounter (Signed)
Pt c/o swelling: STAT is pt has developed SOB within 24 hours  1) How much weight have you gained and in what time span?  8 lbs in 8 days, while in the hospital  2) If swelling, where is the swelling located? Legs and feet  3) Are you currently taking a fluid pill? yes  4) Are you currently SOB? no  5) Do you have a log of your daily weights (if so, list)?   274 lbs  280 lbs  282 lbs   6) Have you gained 3 pounds in a day or 5 pounds in a week? no  7) Have you traveled recently? no

## 2019-02-23 NOTE — Telephone Encounter (Signed)
I spoke to the patient who called because she has had swelling in her legs/feet and has gained 3 lbs since yesterday.  She denies SOB or CP.    She was started on Insulin lately and has been watching diet, eating healthier and lowering sodium intake.  She was given 3 doses of Lasix 40 mg back in late December, which helped, and is on Spironolactone 12.5 mg Daily.  She would like advisement on either increasing Spironolactone or adding Lasix.

## 2019-02-23 NOTE — Telephone Encounter (Signed)
New Message  Pt received phone call during her ECHO and was returning the call.  Please call

## 2019-02-26 NOTE — Telephone Encounter (Signed)
   Awaiting follow up lab work after starting Lasix for more recent acute weight gain noted on 02/23/19. Echocardiogram appears stable with G2DD. Labs to be drawn 03/02/19. Once completed, will need re-assessment of symptoms.   Kathyrn Drown NP-C [place] Pager: [phone]

## 2019-02-27 ENCOUNTER — Ambulatory Visit: Payer: Self-pay

## 2019-02-27 DIAGNOSIS — N10 Acute tubulo-interstitial nephritis: Secondary | ICD-10-CM | POA: Diagnosis not present

## 2019-02-27 DIAGNOSIS — B9629 Other Escherichia coli [E. coli] as the cause of diseases classified elsewhere: Secondary | ICD-10-CM | POA: Diagnosis not present

## 2019-02-27 DIAGNOSIS — Z452 Encounter for adjustment and management of vascular access device: Secondary | ICD-10-CM | POA: Diagnosis not present

## 2019-02-27 DIAGNOSIS — N136 Pyonephrosis: Secondary | ICD-10-CM | POA: Diagnosis not present

## 2019-02-27 DIAGNOSIS — E1165 Type 2 diabetes mellitus with hyperglycemia: Secondary | ICD-10-CM | POA: Diagnosis not present

## 2019-02-27 DIAGNOSIS — Z1612 Extended spectrum beta lactamase (ESBL) resistance: Secondary | ICD-10-CM | POA: Diagnosis not present

## 2019-02-27 NOTE — Telephone Encounter (Signed)
Pt. Asking if there will be people helping at the vaccination location to help handicapped individuals. Instructed her that there will be people to help.

## 2019-02-28 ENCOUNTER — Encounter (HOSPITAL_BASED_OUTPATIENT_CLINIC_OR_DEPARTMENT_OTHER): Payer: Self-pay | Admitting: Urology

## 2019-02-28 ENCOUNTER — Other Ambulatory Visit: Payer: Self-pay

## 2019-02-28 NOTE — Progress Notes (Signed)
Left message with selita patient needs ua done at [facility] urology prior to surgery

## 2019-02-28 NOTE — Progress Notes (Addendum)
Spoke w/ via phone for pre-op interview---[facility] Lab needs dos---- I stat 8             Lab results------ COVID test ------03-02-2019 Arrive at -------930 am 03-06-2019 NPO after ------midnight Medications to take morning of surgery -----carvedilol, sotalol, pantaprazole Diabetic medication -----none day of surgery Patient Special Instructions -----patient not bring ing cpap day of surgery due to cpap mask and tubing will not work with hospoital cpap Pre-Op special Istructions ----- Patient verbalized understanding of instructions that were given at this phone interview. Patient denies shortness of breath, chest pain, fever, cough a this phone  Anesthesia : bmi 51.1, cardiac patient, dm, no cardiac clearance yet has bmet 03-02-2019 at dr turner  [id number] miller Cardiologist :dr turner Chest x-ray :01-15-2019 chart/epic EKG :01-15-2019 Echo :02-22-2018 chart/epic Cardiac Cath : none in epic Sleep Study/ CPAP :cpap on auto set, last sleep study several yrs ago Fasting Blood Sugar :      / Checks Blood Sugar -- times a day:  Checks cbg tid, fasting blood sugar 110 to 110 Blood Thinner/ Instructions /Last Dose:81 mg aspirin staying on per dr turner ASA / Instructions/ Last Dose : xarelto staying on per dr turner  Patient denies shortness of breath, chest pain, fever, and cough at this phone interview.  interview.

## 2019-02-28 NOTE — Progress Notes (Signed)
Left voice mail message for Karen Rasmussen per [name] patient needs cardiac clearance from dr turner and moved to [facility] long main or due to bmi 51.58 and cardiac issues.

## 2019-03-02 ENCOUNTER — Encounter (HOSPITAL_COMMUNITY): Payer: Self-pay | Admitting: Urology

## 2019-03-02 ENCOUNTER — Encounter: Payer: Self-pay | Admitting: Cardiology

## 2019-03-02 ENCOUNTER — Other Ambulatory Visit (HOSPITAL_COMMUNITY)
Admission: RE | Admit: 2019-03-02 | Discharge: 2019-03-02 | Disposition: A | Discharge status: Home / Self Care | Payer: Medicare Other | Source: Ambulatory Visit | Attending: Urology | Admitting: Urology

## 2019-03-02 ENCOUNTER — Other Ambulatory Visit: Payer: Self-pay

## 2019-03-02 ENCOUNTER — Other Ambulatory Visit: Payer: Medicare Other | Admitting: *Deleted

## 2019-03-02 DIAGNOSIS — Z01812 Encounter for preprocedural laboratory examination: Secondary | ICD-10-CM | POA: Diagnosis not present

## 2019-03-02 DIAGNOSIS — R609 Edema, unspecified: Secondary | ICD-10-CM | POA: Diagnosis not present

## 2019-03-02 DIAGNOSIS — Z20822 Contact with and (suspected) exposure to covid-19: Secondary | ICD-10-CM | POA: Insufficient documentation

## 2019-03-02 DIAGNOSIS — Z79899 Other long term (current) drug therapy: Secondary | ICD-10-CM | POA: Diagnosis not present

## 2019-03-02 LAB — BASIC METABOLIC PANEL
BUN/Creatinine Ratio: 17 (ref 12–28)
BUN: 16 mg/dL (ref 8–27)
CO2: 24 mmol/L (ref 20–29)
Calcium: 9.8 mg/dL (ref 8.7–10.3)
Chloride: 103 mmol/L (ref 96–106)
Creatinine, Ser: 0.95 mg/dL (ref 0.57–1.00)
GFR calc Af Amer: 70 mL/min/{1.73_m2} (ref >59)
GFR calc non Af Amer: 60 mL/min/{1.73_m2} (ref >59)
Glucose: 155 mg/dL — ABNORMAL HIGH (ref 65–99)
Potassium: 4.4 mmol/L (ref 3.5–5.2)
Sodium: 140 mmol/L (ref 134–144)

## 2019-03-02 LAB — SARS CORONAVIRUS 2 (TAT 6-24 HRS): SARS Coronavirus 2: NEGATIVE

## 2019-03-02 NOTE — Progress Notes (Signed)
COVID test ------03-02-2019 Arrive at -------0900 am 03-06-2019 NPO after ------midnight Medications to take morning of surgery -----carvedilol, sotalol Diabetic medication -----no NOVOLOG insulins at bedtime night before surgery. If cbg greater than 220, may take half dose of NOVOLOG insulin morning of surgery . Take half does of insulin GLARGINE morning of surgery.  Patient Special Instructions -----patient not bringing CPAP day of surgery due to cpap mask and tubing will not work with hospoital CPAP  Pre-Op special Istructions ----- Patient verbalized understanding of instructions that were given at this phone interview.   Anesthesia : pending cardiac clearance?  Patient has appt for blood work at Dr Fransico Him office today at about 1pm. PA to review chart   [id number] miller Cardiologist : DrTraci turner Chest x-ray :01-15-2019 chart/epic EKG :01-15-2019 Echo :02-22-2018 chart/epic Cardiac Cath : none in epic Sleep Study/ CPAP :cpap on auto set, last sleep study several yrs ago Fasting Blood Sugar :      / Checks Blood Sugar -- times a day:  Checks cbg tid, fasting blood sugar 130s-150s Blood Thinner/ Instructions /Last Dose:81 mg aspirin staying on per dr turner ASA / Instructions/ Last Dose : xarelto staying on per dr turner  Patient denies shortness of breath, chest pain, fever, and cough at this phone interview.  interview.

## 2019-03-02 NOTE — Progress Notes (Signed)
Anesthesia Chart Review   Case: [id number] Date/Time: 03/06/19 1000   Procedures:      CYSTOSCOPY WITH RETROGRADE PYELOGRAM, URETEROSCOPY AND STENT PLACEMENT (Left ) - 6 MINS     HOLMIUM LASER APPLICATION (Left )   Anesthesia type: General   Pre-op diagnosis: LEFT URETERAL CALCULUS   Location: [facility] ROOM 01 / [facility]   Surgeons: Robley Fries, MD      DISCUSSION:74 y.o. never smoker with h/o GERD, CHF (Echo 02/23/19 EF 55-60%), atrial fibrillation, OSA on CPAP, DM II, left ureteral calculus scheduled for above procedure 03/06/19 with Dr. Jacalyn Lefevre.   Discussed with Dr. Jillyn Hidden.  Pt initially moved to Main OR due to BMI of 51.58.  Dr. Jillyn Hidden states pt ok to be done at surgery center.  I have made OR scheduler aware.   Pt cleared by cardiology 03/05/19.  Per Daune Perch, NP, "Chart reviewed as part of pre-operative protocol coverage. Patient was contacted 03/05/2019 in reference to pre-operative risk assessment for pending surgery as outlined below.  Karen Rasmussen was last seen on 10/20/2018 by Sande Rives, PA.  Since that day, Karen Rasmussen has done well. She did have some wt gain but no dyspnea or chest pain. This improved with low dose lasix. Follow up BMet is stable. Echocardiogram on 02/23/19 showed normal LVEF with Gr II DD.   Therefore, based on ACC/AHA guidelines, the patient would be at acceptable risk for the planned procedure without further cardiovascular testing."  Anticipate pt can proceed with planned procedure barring acute status change.   VS: Ht _0  (1.575 m)   Wt 127.9 kg   BMI 51.58 kg/m   PROVIDERS: Kathyrn Lass, MD is PCP   Fransico Him, MD is Cardiologist  LABS: labs DOS (all labs ordered are listed, but only abnormal results are displayed)  Labs Reviewed - No data to display   IMAGES:   EKG: 01/15/2019 Rate 69 bpm  Sinus rhythm with Premature supraventricular complexes Left axis deviation Nonspecific ST abnormality Prolonged QT No  significant change since last tracing  CV: Echo 02/23/2019 IMPRESSIONS    1. Left ventricular ejection fraction, by visual estimation, is 55 to 60%. The left ventricle has normal function. There is mildly increased left ventricular hypertrophy.  2. Elevated left atrial pressure.  3. Left ventricular diastolic parameters are consistent with Grade II diastolic dysfunction (pseudonormalization).  4. The left ventricle has no regional wall motion abnormalities.  5. Global right ventricle has normal systolic function.The right ventricular size is normal. No increase in right ventricular wall thickness.  6. Left atrial size was moderately dilated.  7. Right atrial size was normal.  8. Small pericardial effusion.  9. The pericardial effusion is circumferential. 10. The mitral valve is normal in structure. No evidence of mitral valve regurgitation. 11. The tricuspid valve is normal in structure. 12. The aortic valve is tricuspid. Aortic valve regurgitation is not visualized. Mild aortic valve stenosis. 13. The pulmonic valve was grossly normal. Pulmonic valve regurgitation is not visualized. 14. Mildly elevated pulmonary artery systolic pressure. 15. The inferior vena cava is normal in size with greater than 50% respiratory variability, suggesting right atrial pressure of 3 mmHg.  Myocardial Perfusion 06/01/2016  There was no ST segment deviation noted during stress.  Defect 1: There is a medium defect of moderate severity present in the basal anteroseptal, basal inferior, mid inferior and apical inferior location.  This is a low risk study.   Low risk stress nuclear study with  fixed defects in the basal septum and inferior walls (attenuation vs infarct); no ischemia; study not gated due to atrial fibrillation; suggest echo to assess LV function.  Past Medical History:  Diagnosis Date  . Abdominal pain, left lower quadrant   . Asthmatic bronchitis   . Benign essential HTN 04/26/2016  .  Bursitis of hip   . Chronic diastolic CHF (congestive heart failure) ([place])   . DCM (dilated cardiomyopathy) ([place])    EF 45-50%  . GERD (gastroesophageal reflux disease)   . High cholesterol   . History of kidney stones   . Hypersomnia   . Morbid obesity with BMI of 50.0-59.9, adult ([place])   . OSA on CPAP   . Osteoarthritis    "right hip; both knees" (09/21/2016)  . Persistent atrial fibrillation ([place]) 04/26/2016  . Type II diabetes mellitus ([place])   . Vitamin D deficiency disease     Past Surgical History:  Procedure Laterality Date  . ABDOMINAL HYSTERECTOMY    . APPENDECTOMY    . BALLOON DILATION N/A 11/17/2017   Procedure: BALLOON DILATION;  Surgeon: Ronnette Juniper, MD;  Location: [facility];  Service: Gastroenterology;  Laterality: N/A;  . BIOPSY  11/17/2017   Procedure: BIOPSY;  Surgeon: Ronnette Juniper, MD;  Location: [facility];  Service: Gastroenterology;;  . CARDIAC CATHETERIZATION    . CARDIOVERSION N/A 06/11/2016   Procedure: CARDIOVERSION;  Surgeon: Dorothy Spark, MD;  Location: [facility] ENDOSCOPY;  Service: Cardiovascular;  Laterality: N/A;  . CARDIOVERSION N/A 07/16/2016   Procedure: CARDIOVERSION;  Surgeon: Thayer Headings, MD;  Location: [facility] ENDOSCOPY;  Service: Cardiovascular;  Laterality: N/A;  . CARDIOVERSION N/A 09/23/2016   Procedure: CARDIOVERSION;  Surgeon: Sanda Klein, MD;  Location: [facility] ENDOSCOPY;  Service: Cardiovascular;  Laterality: N/A;  . CHOLECYSTECTOMY OPEN    . COLONOSCOPY N/A 11/17/2017   Procedure: COLONOSCOPY;  Surgeon: Ronnette Juniper, MD;  Location: [facility];  Service: Gastroenterology;  Laterality: N/A;  . cortisone injection to right knee    . costisone injection to left knee  11/03/2017  . CYSTOSCOPY WITH RETROGRADE PYELOGRAM, URETEROSCOPY AND STENT PLACEMENT Left 01/14/2019   Procedure: CYSTOSCOPy  STENT PLACEMENT;  Surgeon: Robley Fries, MD;  Location: [facility];  Service: Urology;  Laterality: Left;  . DILATION AND CURETTAGE OF UTERUS     S/P  miscarriage  . ESOPHAGOGASTRODUODENOSCOPY N/A 11/17/2017   Procedure: ESOPHAGOGASTRODUODENOSCOPY (EGD);  Surgeon: Ronnette Juniper, MD;  Location: [facility];  Service: Gastroenterology;  Laterality: N/A;  . NASAL SEPTUM SURGERY    . POLYPECTOMY  11/17/2017   Procedure: POLYPECTOMY;  Surgeon: Ronnette Juniper, MD;  Location: [facility];  Service: Gastroenterology;;  . TONSILLECTOMY      MEDICATIONS: No current facility-administered medications for this encounter.   Marland Kitchen alendronate (FOSAMAX) 70 MG tablet  . aspirin EC 81 MG EC tablet  . Biotin w/ Vitamins C & E (HAIR SKIN & NAILS GUMMIES PO)  . Calcium-Vitamin D-Vitamin K (CALCIUM + D + K PO)  . carvedilol (COREG) 3.125 MG tablet  . diphenhydrAMINE-zinc acetate (BENADRYL) cream  . folic acid (FOLVITE) 539 MCG tablet  . furosemide (LASIX) 20 MG tablet  . HYDROcodone-acetaminophen (NORCO/VICODIN) 5-325 MG tablet  . insulin aspart (NOVOLOG) 100 UNIT/ML FlexPen  . Insulin Glargine (BASAGLAR KWIKPEN [place])  . Ipratropium-Albuterol (COMBIVENT RESPIMAT) 20-100 MCG/ACT AERS respimat  . Misc Natural Products (GLUCOSAMINE CHONDROITIN TRIPLE) TABS  . oxybutynin (DITROPAN) 5 MG tablet  . pantoprazole (PROTONIX) 40 MG tablet  . potassium chloride (K-DUR,KLOR-CON) 10 MEQ tablet  .  psyllium (METAMUCIL SMOOTH TEXTURE) 28 % packet  . rivaroxaban (XARELTO) 20 MG TABS tablet  . simvastatin (ZOCOR) 10 MG tablet  . sotalol (BETAPACE) 80 MG tablet  . spironolactone (ALDACTONE) 25 MG tablet  . tamsulosin (FLOMAX) 0.4 MG CAPS capsule  . triamcinolone cream (KENALOG) 0.1 %  . acetaminophen (TYLENOL) 650 MG CR tablet  . blood glucose meter kit and supplies  . folic acid (FOLVITE) 1 MG tablet  . Insulin Pen Needle 31G X 5 MM MISC  . Multiple Vitamins-Minerals (CENTRUM SILVER) CHEW     Maia Plan [facility] [phone] 03/05/19  12:45 PM

## 2019-03-04 ENCOUNTER — Telehealth: Payer: Self-pay

## 2019-03-04 NOTE — Telephone Encounter (Signed)
Patient called to receive COVID test results, advised of results as noted negative, she verbalized understanding.

## 2019-03-05 MED ORDER — DEXTROSE 5 % IV SOLN
3.0000 g | INTRAVENOUS | Status: AC
  Administered 2019-03-06: 3 g via INTRAVENOUS
  Filled 2019-03-05: qty 3

## 2019-03-05 NOTE — Telephone Encounter (Signed)
   Primary Cardiologist: Fransico Him, MD  Chart reviewed as part of pre-operative protocol coverage. Patient was contacted 03/05/2019 in reference to pre-operative risk assessment for pending surgery as outlined below.  Karen Rasmussen was last seen on 10/20/2018 by Sande Rives, PA.  Since that day, Karen Rasmussen has done well. She did have some wt gain but no dyspnea or chest pain. This improved with low dose lasix. Follow up BMet is stable. Echocardiogram on 02/23/19 showed normal LVEF with Gr II DD.   Therefore, based on ACC/AHA guidelines, the patient would be at acceptable risk for the planned procedure without further cardiovascular testing.   I will route this recommendation to the requesting party via Epic fax function and remove from pre-op pool.  Please call with questions.  Daune Perch, NP 03/05/2019, 12:24 PM

## 2019-03-05 NOTE — H&P (Signed)
CC/HPI: cc: ureteral calculus   02/15/19: 74 year old woman who presents to the ER on 01/14/2019 found to have a left proximal 43mm obstructing ureteral calculus with signs of sepsis. Patient underwent ureteral stent placement that same day and was treated in the hospital for poorly controlled diabetes as well as infection for several more days. She is scheduled for ureteroscopy with laser lithotripsy and stent exchange on 03/06/2019. Patient has been doing well since surgery. She does say she is tired and worn out any infection. But she denies any fever, chills, dysuria, hematuria or other symptoms of UTI.     ALLERGIES: No Allergies    MEDICATIONS: Oxybutynin Chloride 5 mg tablet 1 tablet PO TID  Tamsulosin Hcl 0.4 mg capsule 1 capsule PO Q HS  Cardizem TABS Oral  Centrum Silver TABS Oral  Fish Oil CAPS Oral  Glucosamine Chondr 1500 Complx Oral Capsule Oral  HydroDiuril 25 MG TABS Oral  LevoFLOXacin 750 MG Oral Tablet Oral  Oxycodone-Acetaminophen 5-325 MG Oral Tablet Oral  Tamsulosin HCl - 0.4 MG Oral Capsule Oral  Vitamin D3 CAPS Oral     GU PSH: Cystoscopy Insert Stent, Left - 01/14/2019 Hysterectomy Unilat SO - 2012       PSH Notes: Cholecystectomy, Hysterectomy, Appendectomy   NON-GU PSH: Appendectomy - 2012 Cholecystectomy (open) - 2012     GU PMH: Renal calculus, Nephrolithiasis - 2014 Ureteral calculus, Calculus of ureter - 2014 Urinary Tract Inf, Unspec site, Urinary tract infection - 2014      PMH Notes:  1898-02-08 00:00:00 - Note: Normal Routine History And Physical Adult  2010-11-11 11:04:09 - Note: Arthritis   NON-GU PMH: Anxiety, Anxiety (Symptom) - 2014 Cardiac murmur, unspecified, Murmurs - 2014 Gout, Gout - 2014 Obesity, Morbid obesity - 2014 Personal history of other diseases of the circulatory system, History of hypertension - 2014 Personal history of other diseases of the nervous system and sense organs, History of sleep apnea - 2014 Personal history  of other mental and behavioral disorders, History of depression - 2014 Arthritis Atrial Fibrillation Diabetes Type 2 GERD Sleep Apnea    FAMILY HISTORY: Death In The Family Father - Runs In Family Death In The Family Mother - Runs In Family Family Health Status Number - Runs In Family   SOCIAL HISTORY: Marital Status: Divorced Current Smoking Status: Patient has never smoked.   Tobacco Use Assessment Completed: Used Tobacco in last 30 days? Has never drank.  Does not drink caffeine. Patient's occupation is/was Retired.     Notes: Never A Smoker, Occupation:, Marital History - Divorced, Alcohol Use, Caffeine Use   REVIEW OF SYSTEMS:    GU Review Female:   Patient reports frequent urination, hard to postpone urination, and get up at night to urinate. Patient denies burning /pain with urination, leakage of urine, stream starts and stops, trouble starting your stream, have to strain to urinate, and being pregnant.  Gastrointestinal (Upper):   Patient denies nausea, vomiting, and indigestion/ heartburn.  Gastrointestinal (Lower):   Patient denies diarrhea and constipation.  Constitutional:   Patient denies fever, night sweats, weight loss, and fatigue.  Skin:   Patient reports skin rash/ lesion. Patient denies itching.  Eyes:   Patient denies blurred vision and double vision.  Ears/ Nose/ Throat:   Patient denies sore throat and sinus problems.  Hematologic/Lymphatic:   Patient denies swollen glands and easy bruising.  Cardiovascular:   Patient reports leg swelling. Patient denies chest pains.  Respiratory:   Patient reports shortness of breath. Patient  denies cough.  Endocrine:   Patient denies excessive thirst.  Musculoskeletal:   Patient reports joint pain. Patient denies back pain.  Neurological:   Patient denies headaches and dizziness.  Psychologic:   Patient denies depression and anxiety.   Notes: Urinary Tract Infection    VITAL SIGNS:      02/15/2019 11:04 AM  Weight  274 lb / 124.28 kg  Height 62 in / 157.48 cm  BP 138/58 mmHg  Pulse 61 /min  Temperature 97.1 F / 36.1 C  BMI 50.1 kg/m   GU PHYSICAL EXAMINATION:    Bladder: Normal to palpation, no tenderness, no mass, normal size.  Cervix: S/P Hysterectomy  Uterus: S/P Hysterectomy   MULTI-SYSTEM PHYSICAL EXAMINATION:    Constitutional: Obese. No physical deformities. Normally developed. Good grooming.   Neck: Neck symmetrical, not swollen. Normal tracheal position.  Respiratory: No labored breathing, no use of accessory muscles.   Cardiovascular: Normal temperature  Skin: No paleness, no jaundice, no cyanosis. No lesion, no ulcer, no rash.  Neurologic / Psychiatric: Oriented to time, oriented to place, oriented to person. No depression, no anxiety, no agitation.  Gastrointestinal: No mass, no tenderness, no rigidity, obese abdomen. No CVA tenderness on the left  Eyes: Normal conjunctivae. Normal eyelids.  Ears, Nose, Mouth, and Throat: Left ear no scars, no lesions, no masses. Right ear no scars, no lesions, no masses. Nose no scars, no lesions, no masses. Normal hearing. Normal lips.  Musculoskeletal: Normal gait and station of head and neck.     PAST DATA REVIEWED:  Source Of History:  Patient  Urine Test Review:   Urinalysis  X-Ray Review: C.T. Abdomen/Pelvis: Reviewed Films.     PROCEDURES:          Urinalysis w/Scope Dipstick Dipstick Cont'd Micro  Color: Amber Bilirubin: Neg mg/dL WBC/hpf: 6 - 10/hpf  Appearance: Slightly Cloudy Ketones: Neg mg/dL RBC/hpf: >60/hpf  Specific Gravity: 1.020 Blood: 3+ ery/uL Bacteria: Few (10-25/hpf)  pH: 6.0 Protein: 2+ mg/dL Cystals: NS (Not Seen)  Glucose: Neg mg/dL Urobilinogen: 0.2 mg/dL Casts: NS (Not Seen)    Nitrites: Neg Trichomonas: Not Present    Leukocyte Esterase: Trace leu/uL Mucous: Not Present      Epithelial Cells: 6 - 10/hpf      Yeast: NS (Not Seen)      Sperm: Not Present    ASSESSMENT:      ICD-10 Details  1 GU:   Ureteral  calculus - N20.1 Left, Acute, Systemic Symptoms - Patient has obstructing ureteral calculus that caused sepsis. She has been treated for this and has a stent currently. Patient completed antibiotics and is feeling well today. She is scheduled for definitive removal the stone on 03/06/2019. The risks and benefits of the procedure were discussed with the patient including but not limited to pain, bleeding, infection, damage to surrounding structures, inability remove all the stone, need for staged procedure, need for additional procedures, stent discomfort. Patient has agreed to proceed  2   Urinary Tract Inf, Unspec site - N39.0 Acute, Systemic Symptoms - Patient has been treated for her UTI which led to sepsis however I will repeat a urinalysis and culture today for preoperative planning.   PLAN:           Orders Labs CULTURE, URINE          Document Letter(s):  Created for Patient: Clinical Summary         Notes:   cc: Kathyrn Lass, MD  Next Appointment:      Next Appointment: 03/06/2019 11:30 AM    Appointment Type: Surgery     Location: [facility] [phone]    Provider: Jacalyn Lefevre, M.D.    Reason for Visit: NE/OP CYSTO, LEFT RGP, URETEROSCOPY, LASER LITHO, STENT EXCHANGE

## 2019-03-06 ENCOUNTER — Encounter (HOSPITAL_COMMUNITY): Payer: Self-pay | Admitting: Urology

## 2019-03-06 ENCOUNTER — Encounter (HOSPITAL_COMMUNITY)
Admission: RE | Disposition: A | Discharge status: Home / Self Care | Payer: Self-pay | Source: Home / Self Care | Attending: Urology

## 2019-03-06 ENCOUNTER — Ambulatory Visit (HOSPITAL_COMMUNITY): Payer: Medicare Other | Admitting: Physician Assistant

## 2019-03-06 ENCOUNTER — Ambulatory Visit (HOSPITAL_COMMUNITY): Payer: Medicare Other

## 2019-03-06 ENCOUNTER — Ambulatory Visit (HOSPITAL_COMMUNITY)
Admission: RE | Admit: 2019-03-06 | Discharge: 2019-03-06 | Disposition: A | Discharge status: Home / Self Care | Payer: Medicare Other | Attending: Urology | Admitting: Urology

## 2019-03-06 DIAGNOSIS — E559 Vitamin D deficiency, unspecified: Secondary | ICD-10-CM | POA: Insufficient documentation

## 2019-03-06 DIAGNOSIS — N390 Urinary tract infection, site not specified: Secondary | ICD-10-CM | POA: Diagnosis not present

## 2019-03-06 DIAGNOSIS — Z6841 Body Mass Index (BMI) 40.0 and over, adult: Secondary | ICD-10-CM | POA: Diagnosis not present

## 2019-03-06 DIAGNOSIS — J45909 Unspecified asthma, uncomplicated: Secondary | ICD-10-CM | POA: Insufficient documentation

## 2019-03-06 DIAGNOSIS — N201 Calculus of ureter: Secondary | ICD-10-CM | POA: Insufficient documentation

## 2019-03-06 DIAGNOSIS — K219 Gastro-esophageal reflux disease without esophagitis: Secondary | ICD-10-CM | POA: Diagnosis not present

## 2019-03-06 DIAGNOSIS — M1611 Unilateral primary osteoarthritis, right hip: Secondary | ICD-10-CM | POA: Diagnosis not present

## 2019-03-06 DIAGNOSIS — Z466 Encounter for fitting and adjustment of urinary device: Secondary | ICD-10-CM | POA: Diagnosis not present

## 2019-03-06 DIAGNOSIS — Z794 Long term (current) use of insulin: Secondary | ICD-10-CM | POA: Diagnosis not present

## 2019-03-06 DIAGNOSIS — Z7982 Long term (current) use of aspirin: Secondary | ICD-10-CM | POA: Diagnosis not present

## 2019-03-06 DIAGNOSIS — Z7901 Long term (current) use of anticoagulants: Secondary | ICD-10-CM | POA: Diagnosis not present

## 2019-03-06 DIAGNOSIS — I4891 Unspecified atrial fibrillation: Secondary | ICD-10-CM | POA: Insufficient documentation

## 2019-03-06 DIAGNOSIS — M170 Bilateral primary osteoarthritis of knee: Secondary | ICD-10-CM | POA: Diagnosis not present

## 2019-03-06 DIAGNOSIS — I110 Hypertensive heart disease with heart failure: Secondary | ICD-10-CM | POA: Diagnosis not present

## 2019-03-06 DIAGNOSIS — I420 Dilated cardiomyopathy: Secondary | ICD-10-CM | POA: Diagnosis not present

## 2019-03-06 DIAGNOSIS — E119 Type 2 diabetes mellitus without complications: Secondary | ICD-10-CM | POA: Insufficient documentation

## 2019-03-06 DIAGNOSIS — Z79899 Other long term (current) drug therapy: Secondary | ICD-10-CM | POA: Diagnosis not present

## 2019-03-06 DIAGNOSIS — I4819 Other persistent atrial fibrillation: Secondary | ICD-10-CM | POA: Diagnosis not present

## 2019-03-06 DIAGNOSIS — I5032 Chronic diastolic (congestive) heart failure: Secondary | ICD-10-CM | POA: Insufficient documentation

## 2019-03-06 DIAGNOSIS — G4733 Obstructive sleep apnea (adult) (pediatric): Secondary | ICD-10-CM | POA: Insufficient documentation

## 2019-03-06 DIAGNOSIS — Z87442 Personal history of urinary calculi: Secondary | ICD-10-CM | POA: Diagnosis not present

## 2019-03-06 HISTORY — PX: CYSTOSCOPY WITH RETROGRADE PYELOGRAM, URETEROSCOPY AND STENT PLACEMENT: SHX5789

## 2019-03-06 HISTORY — PX: HOLMIUM LASER APPLICATION: SHX5852

## 2019-03-06 LAB — CBC
HCT: 40.5 % (ref 36.0–46.0)
Hemoglobin: 12.2 g/dL (ref 12.0–15.0)
MCH: 27.3 pg (ref 26.0–34.0)
MCHC: 30.1 g/dL (ref 30.0–36.0)
MCV: 90.6 fL (ref 80.0–100.0)
Platelets: 214 10*3/uL (ref 150–400)
RBC: 4.47 MIL/uL (ref 3.87–5.11)
RDW: 14.3 % (ref 11.5–15.5)
WBC: 7.6 10*3/uL (ref 4.0–10.5)
nRBC: 0 % (ref 0.0–0.2)

## 2019-03-06 LAB — URINALYSIS, ROUTINE W REFLEX MICROSCOPIC
Bilirubin Urine: NEGATIVE
Glucose, UA: NEGATIVE mg/dL
Ketones, ur: NEGATIVE mg/dL
Nitrite: NEGATIVE
Protein, ur: 30 mg/dL — AB
RBC / HPF: >50 RBC/hpf — ABNORMAL HIGH (ref 0–5)
Specific Gravity, Urine: 1.019 (ref 1.005–1.030)
pH: 5 (ref 5.0–8.0)

## 2019-03-06 LAB — GLUCOSE, CAPILLARY
Glucose-Capillary: 152 mg/dL — ABNORMAL HIGH (ref 70–99)
Glucose-Capillary: 196 mg/dL — ABNORMAL HIGH (ref 70–99)

## 2019-03-06 SURGERY — CYSTOURETEROSCOPY, WITH RETROGRADE PYELOGRAM AND STENT INSERTION
Anesthesia: General | Site: Bladder | Laterality: Left

## 2019-03-06 MED ORDER — LACTATED RINGERS IV SOLN: INTRAVENOUS | Status: DC

## 2019-03-06 MED ORDER — PROPOFOL 10 MG/ML IV BOLUS
INTRAVENOUS | Status: AC
  Filled 2019-03-06: qty 20

## 2019-03-06 MED ORDER — CIPROFLOXACIN HCL 500 MG PO TABS: 500.0000 mg | ORAL_TABLET | Freq: Every day | ORAL | 0 refills | Status: DC

## 2019-03-06 MED ORDER — FENTANYL CITRATE (PF) 100 MCG/2ML IJ SOLN
INTRAMUSCULAR | Status: AC
  Filled 2019-03-06: qty 2

## 2019-03-06 MED ORDER — MIDAZOLAM HCL 2 MG/2ML IJ SOLN
INTRAMUSCULAR | Status: DC | PRN
  Administered 2019-03-06: 2 mg via INTRAVENOUS

## 2019-03-06 MED ORDER — ACETAMINOPHEN 500 MG PO TABS
1000.0000 mg | ORAL_TABLET | Freq: Once | ORAL | Status: AC
  Administered 2019-03-06: 1000 mg via ORAL
  Filled 2019-03-06: qty 2

## 2019-03-06 MED ORDER — LIDOCAINE 2% (20 MG/ML) 5 ML SYRINGE
INTRAMUSCULAR | Status: DC | PRN
  Administered 2019-03-06: 60 mg via INTRAVENOUS

## 2019-03-06 MED ORDER — MIDAZOLAM HCL 2 MG/2ML IJ SOLN
INTRAMUSCULAR | Status: AC
  Filled 2019-03-06: qty 2

## 2019-03-06 MED ORDER — CIPROFLOXACIN IN D5W 400 MG/200ML IV SOLN
400.0000 mg | Freq: Once | INTRAVENOUS | Status: AC
  Administered 2019-03-06: 400 mg via INTRAVENOUS
  Filled 2019-03-06: qty 200

## 2019-03-06 MED ORDER — PHENAZOPYRIDINE HCL 100 MG PO TABS: 100.0000 mg | ORAL_TABLET | Freq: Three times a day (TID) | ORAL | 0 refills | Status: AC | PRN

## 2019-03-06 MED ORDER — FENTANYL CITRATE (PF) 100 MCG/2ML IJ SOLN: 25.0000 ug | INTRAMUSCULAR | Status: DC | PRN

## 2019-03-06 MED ORDER — PROPOFOL 500 MG/50ML IV EMUL
INTRAVENOUS | Status: DC | PRN
  Administered 2019-03-06: 20 mg via INTRAVENOUS
  Administered 2019-03-06: 180 mg via INTRAVENOUS

## 2019-03-06 MED ORDER — CELECOXIB 200 MG PO CAPS
400.0000 mg | ORAL_CAPSULE | Freq: Once | ORAL | Status: AC
  Administered 2019-03-06: 400 mg via ORAL
  Filled 2019-03-06: qty 2

## 2019-03-06 MED ORDER — FENTANYL CITRATE (PF) 100 MCG/2ML IJ SOLN
INTRAMUSCULAR | Status: DC | PRN
  Administered 2019-03-06 (×2): 25 ug via INTRAVENOUS
  Administered 2019-03-06: 50 ug via INTRAVENOUS

## 2019-03-06 MED ORDER — ONDANSETRON HCL 4 MG/2ML IJ SOLN
INTRAMUSCULAR | Status: DC | PRN
  Administered 2019-03-06: 4 mg via INTRAVENOUS

## 2019-03-06 MED ORDER — DEXAMETHASONE SODIUM PHOSPHATE 10 MG/ML IJ SOLN
INTRAMUSCULAR | Status: DC | PRN
  Administered 2019-03-06: 10 mg via INTRAVENOUS

## 2019-03-06 MED ORDER — PROMETHAZINE HCL 25 MG/ML IJ SOLN: 6.2500 mg | INTRAMUSCULAR | Status: DC | PRN

## 2019-03-06 MED ORDER — SODIUM CHLORIDE 0.9 % IR SOLN
Status: DC | PRN
  Administered 2019-03-06: 3000 mL

## 2019-03-06 MED ORDER — DEXAMETHASONE SODIUM PHOSPHATE 10 MG/ML IJ SOLN
INTRAMUSCULAR | Status: AC
  Filled 2019-03-06: qty 1

## 2019-03-06 MED ORDER — ONDANSETRON HCL 4 MG/2ML IJ SOLN
INTRAMUSCULAR | Status: AC
  Filled 2019-03-06: qty 2

## 2019-03-06 MED ORDER — LIDOCAINE 2% (20 MG/ML) 5 ML SYRINGE
INTRAMUSCULAR | Status: AC
  Filled 2019-03-06: qty 5

## 2019-03-06 SURGICAL SUPPLY — 23 items
BAG URO CATCHER STRL LF (MISCELLANEOUS) ×4 IMPLANT
BASKET ZERO TIP NITINOL 2.4FR (BASKET) ×4 IMPLANT
CATH URET 5FR 28IN OPEN ENDED (CATHETERS) ×4 IMPLANT
CLOTH BEACON ORANGE TIMEOUT ST (SAFETY) ×4 IMPLANT
COVER WAND RF STERILE (DRAPES) IMPLANT
DRSG TEGADERM 2-3/8X2-3/4 SM (GAUZE/BANDAGES/DRESSINGS) ×4 IMPLANT
EXTRACTOR STONE 1.7FRX115CM (UROLOGICAL SUPPLIES) IMPLANT
FIBER LASER TRAC TIP (UROLOGICAL SUPPLIES) ×4 IMPLANT
GLOVE BIO SURGEON STRL SZ 6.5 (GLOVE) ×3 IMPLANT
GLOVE BIO SURGEONS STRL SZ 6.5 (GLOVE) ×1
GOWN STRL REUS W/TWL LRG LVL3 (GOWN DISPOSABLE) ×4 IMPLANT
GUIDEWIRE STR DUAL SENSOR (WIRE) ×8 IMPLANT
KIT TURNOVER KIT A (KITS) IMPLANT
MANIFOLD NEPTUNE II (INSTRUMENTS) ×4 IMPLANT
PACK CYSTO (CUSTOM PROCEDURE TRAY) ×4 IMPLANT
PENCIL SMOKE EVACUATOR (MISCELLANEOUS) IMPLANT
SHEATH URETERAL 12FRX28CM (UROLOGICAL SUPPLIES) ×8 IMPLANT
SHEATH URETERAL 12FRX35CM (MISCELLANEOUS) IMPLANT
STENT URET 6FRX24 CONTOUR (STENTS) ×4 IMPLANT
TUBING CONNECTING 10 (TUBING) ×3 IMPLANT
TUBING CONNECTING 10' (TUBING) ×1
TUBING UROLOGY SET (TUBING) ×4 IMPLANT
WIRE COONS/BENSON .038X145CM (WIRE) IMPLANT

## 2019-03-06 NOTE — Anesthesia Procedure Notes (Signed)
Procedure Name: LMA Insertion Date/Time: 03/06/2019 11:00 AM Performed by: Gerald Leitz, CRNA Pre-anesthesia Checklist: Patient identified, Patient being monitored, Timeout performed, Emergency Drugs available and Suction available Patient Re-evaluated:Patient Re-evaluated prior to induction Oxygen Delivery Method: Circle system utilized Preoxygenation: Pre-oxygenation with 100% oxygen Induction Type: IV induction Ventilation: Mask ventilation without difficulty LMA: LMA inserted LMA Size: 4.0 Tube type: Oral Number of attempts: 1 Placement Confirmation: positive ETCO2 and breath sounds checked- equal and bilateral Tube secured with: Tape Dental Injury: Teeth and Oropharynx as per pre-operative assessment

## 2019-03-06 NOTE — Discharge Instructions (Signed)
Post stent placement instructions   Definitions:  Ureter: The duct that transports urine from the kidney to the bladder. Stent: A plastic hollow tube that is placed into the ureter, from the kidney to the bladder to prevent the ureter from swelling shut.  General instructions:  Despite the fact that no skin incisions were used, the area around the ureter and bladder is raw and irritated. The stent is a foreign body which can further irritate the bladder wall. This irritation is manifested by increased frequency of urination, both day and night, and by an increase in the urge to urinate. In some, the urge to urinate is present almost always. Sometimes the urge is strong enough that you may not be able to stop your self from urinating. This can often be controlled with medication but does not occur in everyone. A stent can safely be left in place for 3 months or greater.  You may see some blood in your urine while the stent is in place and a few days afterward. Do not be alarmed, even if the urine is clear for a while. Get off your feet and drink lots of fluids until clearing occurs. If you start to pass clots or don't improve, call us.  Diet:  You may return to your normal diet immediately. Because of the raw surface of your bladder, alcohol, spicy foods, foods high in acid and drinks with caffeine may cause irritation or frequency and should be used in moderation. To keep your urine flowing freely and avoid constipation, drink plenty of fluids during the day (8-10 glasses). Tip: Avoid cranberry juice because it is very acidic.  Activity:  Your physical activity doesn't need to be restricted. However, if you are very active, you may see some blood in the urine. We suggest that you reduce your activity under the circumstances until the bleeding has stopped.  Bowels:  It is important to keep your bowels regular during the postoperative period. Straining with bowel movements can cause bleeding. A  bowel movement every other day is reasonable. Use a mild laxative if needed, such as milk of magnesia 2-3 tablespoons, or 2 Dulcolax tablets. Call if you continue to have problems. If you had been taking narcotics for pain, before, during or after your surgery, you may be constipated. Take a laxative if necessary.  Medication:  You should resume your pre-surgery medications unless told not to. In addition you may be given an antibiotic to prevent or treat infection. Antibiotics are not always necessary. All medication should be taken as prescribed until the bottles are finished unless you are having an unusual reaction to one of the drugs.  Problems you should report to [place]:  a. Fever greater than 101F. b. Heavy bleeding, or clots (see notes above about blood in urine). c. Inability to urinate. d. Drug reactions (hives, rash, nausea, vomiting, diarrhea). e. Severe burning or pain with urination that is not improving.  Stent removal: 1. Please take antibiotic prior to removing your stent 2. You can remove your stent on Friday, 03/09/19 by removing the bandage on your leg and pulling the string until the entire stent is removed

## 2019-03-06 NOTE — Interval H&P Note (Signed)
History and Physical Interval Note:  03/06/2019 9:59 AM  Karen Rasmussen  has presented today for surgery, with the diagnosis of LEFT URETERAL CALCULUS.  The various methods of treatment have been discussed with the patient and family. After consideration of risks, benefits and other options for treatment, the patient has consented to  Procedure(s) with comments: CYSTOSCOPY WITH RETROGRADE PYELOGRAM, URETEROSCOPY AND STENT PLACEMENT (Left) - 73 MINS HOLMIUM LASER APPLICATION (Left) as a surgical intervention.  The patient's history has been reviewed, patient examined, no change in status, stable for surgery.  I have reviewed the patient's chart and labs.  Questions were answered to the patient's satisfaction.     Karen Rasmussen

## 2019-03-06 NOTE — Transfer of Care (Signed)
Immediate Anesthesia Transfer of Care Note  Patient: Karen Rasmussen  Procedure(s) Performed: Procedure(s) with comments: CYSTOSCOPY WITH RETROGRADE PYELOGRAM, URETEROSCOPY AND STENT PLACEMENT (Left) - 32 MINS HOLMIUM LASER APPLICATION (Left)  Patient Location: PACU  Anesthesia Type:General  Level of Consciousness: Alert, Awake, Oriented  Airway & Oxygen Therapy: Patient Spontanous Breathing  Post-op Assessment: Report given to RN  Post vital signs: Reviewed and stable  Last Vitals: There were no vitals filed for this visit.  Complications: No apparent anesthesia complications

## 2019-03-06 NOTE — Anesthesia Preprocedure Evaluation (Addendum)
Anesthesia Evaluation  Patient identified by MRN, date of birth, ID band Patient awake    Reviewed: Allergy & Precautions, NPO status , Patient's Chart, lab work & pertinent test results, reviewed documented beta blocker date and time   History of Anesthesia Complications Negative for: history of anesthetic complications  Airway Mallampati: II  TM Distance: >3 FB Neck ROM: Full    Dental no notable dental hx. (+) Dental Advisory Given   Pulmonary asthma , sleep apnea and Continuous Positive Airway Pressure Ventilation ,    Pulmonary exam normal        Cardiovascular hypertension, Pt. on medications and Pt. on home beta blockers +CHF  Normal cardiovascular exam+ dysrhythmias Atrial Fibrillation   TTE 2018: moderate LVH, EF 45-50%, diffuse hypokinesis    Neuro/Psych Depression negative neurological ROS     GI/Hepatic Neg liver ROS, GERD  Controlled and Medicated,  Endo/Other  diabetes, Type 2, Oral Hypoglycemic AgentsMorbid obesity  Renal/GU ARFRenal disease (Cr 1.43)  negative genitourinary   Musculoskeletal  (+) Arthritis ,   Abdominal   Peds  Hematology  (+) anemia , Hgb 10.4   Anesthesia Other Findings Day of surgery medications reviewed with patient.  Reproductive/Obstetrics negative OB ROS                            Anesthesia Physical  Anesthesia Plan  ASA: III  Anesthesia Plan: General   Post-op Pain Management:    Induction: Intravenous  PONV Risk Score and Plan: 3 and Treatment may vary due to age or medical condition, Ondansetron, Dexamethasone and Midazolam  Airway Management Planned: LMA  Additional Equipment: None  Intra-op Plan:   Post-operative Plan: Extubation in OR  Informed Consent: I have reviewed the patients History and Physical, chart, labs and discussed the procedure including the risks, benefits and alternatives for the proposed anesthesia with the  patient or authorized representative who has indicated his/her understanding and acceptance.     Dental advisory given  Plan Discussed with: Anesthesiologist and CRNA  Anesthesia Plan Comments:        Anesthesia Quick Evaluation

## 2019-03-06 NOTE — Op Note (Signed)
PATIENT:  Karen Rasmussen  PRE-OPERATIVE DIAGNOSIS:  left Ureteral calculus  POST-OPERATIVE DIAGNOSIS: Same  PROCEDURE:  1. Cystoscopy, left ureteroscopy with laser lithotripsy and left ureteral stent exchange  SURGEON: Jacalyn Lefevre, MD  INDICATION: 74 year old woman with a 7 mm left UPJ calculus who presented with sepsis secondary to urinary tract infection and underwent stent placement emergently.  She now returns for definitive management of her stones.  FINDINGS: 1. Normal urethra 2. Normal bladder  3. Stent encrustation 4. Poor visualization secondary to oozing (pt remained on anticoagulation) 5. 6Fr x 24cm left ureteral stent with string  ANESTHESIA:  General  EBL:  Minimal  DRAINS: 6Fr x 24cm left ureteral stent with string  SPECIMEN:  none   DESCRIPTION OF PROCEDURE: The patient was taken to the major OR and placed on the table. General anesthesia was administered and then the patient was moved to the dorsal lithotomy position. The genitalia was sterilely prepped and draped. An official timeout was performed.  Initially the 84 French cystoscope with 30 lens was passed under direct vision into the bladder. The bladder was then fully inspected. It was noted be free of any tumors, stones or inflammatory lesions.   The existing left ureteral stent was seen emanating from the left ureteral orifice.  It was grasped with a grasper was brought to the urethral meatus attempts at advancing the wire through the stent were unsuccessful due to encrustation.  The stent was removed.  A 0.35 sensor wire was then advanced through the left ureteral orifice up to the kidney with fluoroscopic guidance.  This wire was secured in a second wire was placed in a similar manner.  Next ureteral at access sheath was placed over the working wire and the inner sheath and wire removed.  Flexible ureteroscopy took place.  Patient had multiple small stone fragments in the upper pole.  These appeared soft and  due to stent encrustation.  A larger lower pole calculus was seen.  The 200  holmium laser fiber was used to fragment the stone. I then used the nitinol basket to extract all of the stone fragments and reinspection of the ureter ureteroscopically revealed no further stone fragments and no injury to the ureter. I then backloaded the cystoscope over the guidewire and passed the stent over the guidewire into the area of the renal pelvis. As the guidewire was removed good curl was noted in the renal pelvis. The bladder was drained and the cystoscope was then removed. The patient tolerated the procedure well no intraoperative complications.  PLAN OF CARE: Discharge to home after PACU  PATIENT DISPOSITION:  PACU - hemodynamically stable.

## 2019-03-06 NOTE — Anesthesia Postprocedure Evaluation (Signed)
Anesthesia Post Note  Patient: Karen Rasmussen  Procedure(s) Performed: CYSTOSCOPY WITH RETROGRADE PYELOGRAM, URETEROSCOPY AND STENT PLACEMENT (Left Bladder) HOLMIUM LASER APPLICATION (Left )     Patient location during evaluation: PACU Anesthesia Type: General Level of consciousness: sedated Pain management: pain level controlled Vital Signs Assessment: post-procedure vital signs reviewed and stable Respiratory status: spontaneous breathing and respiratory function stable Cardiovascular status: stable Postop Assessment: no apparent nausea or vomiting Anesthetic complications: no    Last Vitals:  Vitals:   03/06/19 1230 03/06/19 1245  BP: 140/68 140/68  Pulse: (!) 56 (!) 57  Resp: 20 15  Temp: (!) 36.3 C 36.7 C  SpO2: 98% 98%    Last Pain:  Vitals:   03/06/19 1245  PainSc: 1                  Chiana Wamser DANIEL

## 2019-03-07 ENCOUNTER — Emergency Department (HOSPITAL_COMMUNITY): Payer: Medicare Other

## 2019-03-07 ENCOUNTER — Encounter (HOSPITAL_COMMUNITY): Payer: Self-pay | Admitting: Emergency Medicine

## 2019-03-07 ENCOUNTER — Emergency Department (HOSPITAL_COMMUNITY): Payer: Medicare Other | Admitting: Anesthesiology

## 2019-03-07 ENCOUNTER — Observation Stay (HOSPITAL_COMMUNITY)
Admission: EM | Admit: 2019-03-07 | Discharge: 2019-03-08 | Disposition: A | Discharge status: Home / Self Care | Payer: Medicare Other | Attending: Urology | Admitting: Urology

## 2019-03-07 ENCOUNTER — Other Ambulatory Visit: Payer: Self-pay

## 2019-03-07 ENCOUNTER — Encounter (HOSPITAL_COMMUNITY): Admission: EM | Disposition: A | Discharge status: Home / Self Care | Payer: Self-pay | Source: Home / Self Care

## 2019-03-07 DIAGNOSIS — E119 Type 2 diabetes mellitus without complications: Secondary | ICD-10-CM | POA: Diagnosis not present

## 2019-03-07 DIAGNOSIS — Z466 Encounter for fitting and adjustment of urinary device: Secondary | ICD-10-CM | POA: Diagnosis not present

## 2019-03-07 DIAGNOSIS — I4819 Other persistent atrial fibrillation: Secondary | ICD-10-CM | POA: Insufficient documentation

## 2019-03-07 DIAGNOSIS — Z7982 Long term (current) use of aspirin: Secondary | ICD-10-CM | POA: Insufficient documentation

## 2019-03-07 DIAGNOSIS — Z6841 Body Mass Index (BMI) 40.0 and over, adult: Secondary | ICD-10-CM | POA: Diagnosis not present

## 2019-03-07 DIAGNOSIS — T83122A Displacement of urinary stent, initial encounter: Secondary | ICD-10-CM

## 2019-03-07 DIAGNOSIS — Z79899 Other long term (current) drug therapy: Secondary | ICD-10-CM | POA: Diagnosis not present

## 2019-03-07 DIAGNOSIS — E1165 Type 2 diabetes mellitus with hyperglycemia: Secondary | ICD-10-CM | POA: Diagnosis not present

## 2019-03-07 DIAGNOSIS — N179 Acute kidney failure, unspecified: Secondary | ICD-10-CM | POA: Diagnosis not present

## 2019-03-07 DIAGNOSIS — M170 Bilateral primary osteoarthritis of knee: Secondary | ICD-10-CM | POA: Diagnosis not present

## 2019-03-07 DIAGNOSIS — Z7901 Long term (current) use of anticoagulants: Secondary | ICD-10-CM | POA: Insufficient documentation

## 2019-03-07 DIAGNOSIS — I420 Dilated cardiomyopathy: Secondary | ICD-10-CM | POA: Diagnosis not present

## 2019-03-07 DIAGNOSIS — R2681 Unsteadiness on feet: Secondary | ICD-10-CM | POA: Diagnosis not present

## 2019-03-07 DIAGNOSIS — G4733 Obstructive sleep apnea (adult) (pediatric): Secondary | ICD-10-CM | POA: Insufficient documentation

## 2019-03-07 DIAGNOSIS — M1611 Unilateral primary osteoarthritis, right hip: Secondary | ICD-10-CM | POA: Diagnosis not present

## 2019-03-07 DIAGNOSIS — R58 Hemorrhage, not elsewhere classified: Secondary | ICD-10-CM | POA: Diagnosis not present

## 2019-03-07 DIAGNOSIS — I5032 Chronic diastolic (congestive) heart failure: Secondary | ICD-10-CM | POA: Insufficient documentation

## 2019-03-07 DIAGNOSIS — E7800 Pure hypercholesterolemia, unspecified: Secondary | ICD-10-CM | POA: Insufficient documentation

## 2019-03-07 DIAGNOSIS — Z20822 Contact with and (suspected) exposure to covid-19: Secondary | ICD-10-CM | POA: Insufficient documentation

## 2019-03-07 DIAGNOSIS — Z833 Family history of diabetes mellitus: Secondary | ICD-10-CM | POA: Diagnosis not present

## 2019-03-07 DIAGNOSIS — N200 Calculus of kidney: Secondary | ICD-10-CM

## 2019-03-07 DIAGNOSIS — I110 Hypertensive heart disease with heart failure: Secondary | ICD-10-CM | POA: Diagnosis not present

## 2019-03-07 DIAGNOSIS — K219 Gastro-esophageal reflux disease without esophagitis: Secondary | ICD-10-CM | POA: Insufficient documentation

## 2019-03-07 DIAGNOSIS — I10 Essential (primary) hypertension: Secondary | ICD-10-CM | POA: Diagnosis not present

## 2019-03-07 DIAGNOSIS — Z8249 Family history of ischemic heart disease and other diseases of the circulatory system: Secondary | ICD-10-CM | POA: Diagnosis not present

## 2019-03-07 DIAGNOSIS — J45909 Unspecified asthma, uncomplicated: Secondary | ICD-10-CM | POA: Diagnosis not present

## 2019-03-07 DIAGNOSIS — Z794 Long term (current) use of insulin: Secondary | ICD-10-CM | POA: Diagnosis not present

## 2019-03-07 DIAGNOSIS — F329 Major depressive disorder, single episode, unspecified: Secondary | ICD-10-CM | POA: Insufficient documentation

## 2019-03-07 DIAGNOSIS — N23 Unspecified renal colic: Secondary | ICD-10-CM | POA: Diagnosis not present

## 2019-03-07 HISTORY — PX: CYSTOSCOPY WITH STENT PLACEMENT: SHX5790

## 2019-03-07 LAB — GLUCOSE, CAPILLARY
Glucose-Capillary: 192 mg/dL — ABNORMAL HIGH (ref 70–99)
Glucose-Capillary: 197 mg/dL — ABNORMAL HIGH (ref 70–99)
Glucose-Capillary: 202 mg/dL — ABNORMAL HIGH (ref 70–99)
Glucose-Capillary: 274 mg/dL — ABNORMAL HIGH (ref 70–99)
Glucose-Capillary: 304 mg/dL — ABNORMAL HIGH (ref 70–99)
Glucose-Capillary: 217 mg/dL — ABNORMAL HIGH (ref 70–99)
Glucose-Capillary: 217 mg/dL — ABNORMAL HIGH (ref 70–99)

## 2019-03-07 LAB — RESPIRATORY PANEL BY RT PCR (FLU A&B, COVID)
Influenza A by PCR: NEGATIVE
Influenza B by PCR: NEGATIVE
SARS Coronavirus 2 by RT PCR: NEGATIVE

## 2019-03-07 SURGERY — CYSTOSCOPY, WITH STENT INSERTION
Anesthesia: General | Site: Urethra | Laterality: Left

## 2019-03-07 MED ORDER — LACTATED RINGERS IV SOLN: INTRAVENOUS | Status: DC

## 2019-03-07 MED ORDER — HYDROMORPHONE HCL 1 MG/ML IJ SOLN
0.5000 mg | Freq: Once | INTRAMUSCULAR | Status: AC
  Administered 2019-03-07: 0.5 mg via INTRAVENOUS

## 2019-03-07 MED ORDER — SPIRONOLACTONE 12.5 MG HALF TABLET
12.5000 mg | ORAL_TABLET | Freq: Every day | ORAL | Status: DC
  Administered 2019-03-08: 12.5 mg via ORAL
  Filled 2019-03-07: qty 1

## 2019-03-07 MED ORDER — IPRATROPIUM-ALBUTEROL 0.5-2.5 (3) MG/3ML IN SOLN: 3.0000 mL | Freq: Four times a day (QID) | RESPIRATORY_TRACT | Status: DC | PRN

## 2019-03-07 MED ORDER — HYDROMORPHONE HCL 1 MG/ML IJ SOLN
INTRAMUSCULAR | Status: AC
  Filled 2019-03-07: qty 1

## 2019-03-07 MED ORDER — PROPOFOL 10 MG/ML IV BOLUS
INTRAVENOUS | Status: AC
  Filled 2019-03-07: qty 20

## 2019-03-07 MED ORDER — CARVEDILOL 3.125 MG PO TABS
3.1250 mg | ORAL_TABLET | Freq: Two times a day (BID) | ORAL | Status: DC
  Administered 2019-03-07 – 2019-03-08 (×2): 3.125 mg via ORAL
  Filled 2019-03-07 (×2): qty 1

## 2019-03-07 MED ORDER — ONDANSETRON HCL 4 MG/2ML IJ SOLN: 4.0000 mg | Freq: Four times a day (QID) | INTRAMUSCULAR | Status: DC | PRN

## 2019-03-07 MED ORDER — HYDROMORPHONE HCL 1 MG/ML IJ SOLN
0.5000 mg | Freq: Once | INTRAMUSCULAR | Status: AC
  Administered 2019-03-07: 0.5 mg via INTRAVENOUS
  Filled 2019-03-07: qty 1

## 2019-03-07 MED ORDER — INSULIN ASPART 100 UNIT/ML ~~LOC~~ SOLN
0.0000 [IU] | SUBCUTANEOUS | Status: DC
  Administered 2019-03-07: 15 [IU] via SUBCUTANEOUS
  Administered 2019-03-07: 7 [IU] via SUBCUTANEOUS
  Administered 2019-03-07: 11 [IU] via SUBCUTANEOUS
  Administered 2019-03-08 (×4): 4 [IU] via SUBCUTANEOUS

## 2019-03-07 MED ORDER — ASPIRIN EC 81 MG PO TBEC
81.0000 mg | DELAYED_RELEASE_TABLET | Freq: Every day | ORAL | Status: DC
  Administered 2019-03-07 – 2019-03-08 (×2): 81 mg via ORAL
  Filled 2019-03-07 (×2): qty 1

## 2019-03-07 MED ORDER — HYDROCODONE-ACETAMINOPHEN 5-325 MG PO TABS
1.0000 | ORAL_TABLET | Freq: Three times a day (TID) | ORAL | Status: DC | PRN
  Administered 2019-03-07 – 2019-03-08 (×3): 1 via ORAL
  Filled 2019-03-07 (×3): qty 1

## 2019-03-07 MED ORDER — SODIUM CHLORIDE 0.9 % IR SOLN
Status: DC | PRN
  Administered 2019-03-07: 3000 mL via INTRAVESICAL

## 2019-03-07 MED ORDER — DEXAMETHASONE SODIUM PHOSPHATE 10 MG/ML IJ SOLN
INTRAMUSCULAR | Status: DC | PRN
  Administered 2019-03-07: 10 mg via INTRAVENOUS

## 2019-03-07 MED ORDER — SOTALOL HCL 80 MG PO TABS
80.0000 mg | ORAL_TABLET | Freq: Two times a day (BID) | ORAL | Status: DC
  Administered 2019-03-07 – 2019-03-08 (×3): 80 mg via ORAL
  Filled 2019-03-07 (×4): qty 1

## 2019-03-07 MED ORDER — OXYBUTYNIN CHLORIDE 5 MG PO TABS
5.0000 mg | ORAL_TABLET | Freq: Every day | ORAL | Status: DC
  Administered 2019-03-07: 5 mg via ORAL
  Filled 2019-03-07: qty 1

## 2019-03-07 MED ORDER — LIDOCAINE 2% (20 MG/ML) 5 ML SYRINGE
INTRAMUSCULAR | Status: DC | PRN
  Administered 2019-03-07: 60 mg via INTRAVENOUS

## 2019-03-07 MED ORDER — FENTANYL CITRATE (PF) 100 MCG/2ML IJ SOLN
INTRAMUSCULAR | Status: AC
  Filled 2019-03-07: qty 2

## 2019-03-07 MED ORDER — INSULIN ASPART 100 UNIT/ML ~~LOC~~ SOLN
12.0000 [IU] | Freq: Three times a day (TID) | SUBCUTANEOUS | Status: DC
  Administered 2019-03-07 – 2019-03-08 (×5): 12 [IU] via SUBCUTANEOUS

## 2019-03-07 MED ORDER — SIMVASTATIN 20 MG PO TABS
10.0000 mg | ORAL_TABLET | Freq: Every day | ORAL | Status: DC
  Administered 2019-03-07: 10 mg via ORAL
  Filled 2019-03-07: qty 1

## 2019-03-07 MED ORDER — 0.9 % SODIUM CHLORIDE (POUR BTL) OPTIME
TOPICAL | Status: DC | PRN
  Administered 2019-03-07: 09:00:00 1000 mL

## 2019-03-07 MED ORDER — LIDOCAINE 2% (20 MG/ML) 5 ML SYRINGE
INTRAMUSCULAR | Status: AC
  Filled 2019-03-07: qty 5

## 2019-03-07 MED ORDER — PANTOPRAZOLE SODIUM 40 MG PO TBEC
40.0000 mg | DELAYED_RELEASE_TABLET | Freq: Every day | ORAL | Status: DC
  Administered 2019-03-07 – 2019-03-08 (×2): 40 mg via ORAL
  Filled 2019-03-07 (×2): qty 1

## 2019-03-07 MED ORDER — ACETAMINOPHEN 325 MG PO TABS: 650.0000 mg | ORAL_TABLET | Freq: Three times a day (TID) | ORAL | Status: DC | PRN

## 2019-03-07 MED ORDER — FENTANYL CITRATE (PF) 100 MCG/2ML IJ SOLN: 25.0000 ug | INTRAMUSCULAR | Status: DC | PRN

## 2019-03-07 MED ORDER — PHENAZOPYRIDINE HCL 100 MG PO TABS
100.0000 mg | ORAL_TABLET | Freq: Three times a day (TID) | ORAL | Status: DC | PRN
  Filled 2019-03-07: qty 1

## 2019-03-07 MED ORDER — ONDANSETRON HCL 4 MG/2ML IJ SOLN
INTRAMUSCULAR | Status: AC
  Filled 2019-03-07: qty 2

## 2019-03-07 MED ORDER — PROPOFOL 10 MG/ML IV BOLUS
INTRAVENOUS | Status: DC | PRN
  Administered 2019-03-07: 170 mg via INTRAVENOUS

## 2019-03-07 MED ORDER — DEXAMETHASONE SODIUM PHOSPHATE 10 MG/ML IJ SOLN
INTRAMUSCULAR | Status: AC
  Filled 2019-03-07: qty 1

## 2019-03-07 MED ORDER — ONDANSETRON HCL 4 MG/2ML IJ SOLN
INTRAMUSCULAR | Status: DC | PRN
  Administered 2019-03-07: 4 mg via INTRAVENOUS

## 2019-03-07 MED ORDER — OXYCODONE HCL 5 MG/5ML PO SOLN: 5.0000 mg | Freq: Once | ORAL | Status: DC | PRN

## 2019-03-07 MED ORDER — POTASSIUM CHLORIDE CRYS ER 20 MEQ PO TBCR
20.0000 meq | EXTENDED_RELEASE_TABLET | Freq: Two times a day (BID) | ORAL | Status: DC
  Administered 2019-03-07 – 2019-03-08 (×2): 20 meq via ORAL
  Filled 2019-03-07 (×2): qty 1

## 2019-03-07 MED ORDER — FENTANYL CITRATE (PF) 100 MCG/2ML IJ SOLN
INTRAMUSCULAR | Status: DC | PRN
  Administered 2019-03-07: 25 ug via INTRAVENOUS

## 2019-03-07 MED ORDER — IOHEXOL 300 MG/ML  SOLN
INTRAMUSCULAR | Status: DC | PRN
  Administered 2019-03-07: 09:00:00 6 mL via URETHRAL

## 2019-03-07 MED ORDER — INSULIN GLARGINE 100 UNIT/ML ~~LOC~~ SOLN
34.0000 [IU] | Freq: Every day | SUBCUTANEOUS | Status: DC
  Administered 2019-03-07 – 2019-03-08 (×2): 34 [IU] via SUBCUTANEOUS
  Filled 2019-03-07 (×2): qty 0.34

## 2019-03-07 MED ORDER — GENTAMICIN SULFATE 40 MG/ML IJ SOLN
2.0000 mg/kg | Freq: Once | INTRAVENOUS | Status: AC
  Administered 2019-03-07: 09:00:00 160 mg via INTRAVENOUS
  Filled 2019-03-07: qty 4

## 2019-03-07 MED ORDER — FUROSEMIDE 20 MG PO TABS
20.0000 mg | ORAL_TABLET | Freq: Every day | ORAL | Status: DC
  Administered 2019-03-07 – 2019-03-08 (×2): 20 mg via ORAL
  Filled 2019-03-07 (×2): qty 1

## 2019-03-07 MED ORDER — CIPROFLOXACIN HCL 500 MG PO TABS
500.0000 mg | ORAL_TABLET | Freq: Every day | ORAL | Status: DC
  Administered 2019-03-07 – 2019-03-08 (×2): 500 mg via ORAL
  Filled 2019-03-07 (×2): qty 1

## 2019-03-07 MED ORDER — OXYCODONE HCL 5 MG PO TABS: 5.0000 mg | ORAL_TABLET | Freq: Once | ORAL | Status: DC | PRN

## 2019-03-07 MED ORDER — ONDANSETRON HCL 4 MG/2ML IJ SOLN
4.0000 mg | Freq: Once | INTRAMUSCULAR | Status: DC
  Filled 2019-03-07: qty 2

## 2019-03-07 MED ORDER — TAMSULOSIN HCL 0.4 MG PO CAPS
0.4000 mg | ORAL_CAPSULE | Freq: Every day | ORAL | Status: DC
  Administered 2019-03-07 – 2019-03-08 (×2): 0.4 mg via ORAL
  Filled 2019-03-07 (×2): qty 1

## 2019-03-07 SURGICAL SUPPLY — 11 items
BAG URO CATCHER STRL LF (MISCELLANEOUS) ×3 IMPLANT
CATH URET 5FR 28IN OPEN ENDED (CATHETERS) ×3 IMPLANT
CLOTH BEACON ORANGE TIMEOUT ST (SAFETY) ×3 IMPLANT
GLOVE BIOGEL M STRL SZ7.5 (GLOVE) ×3 IMPLANT
GOWN STRL REUS W/TWL XL LVL3 (GOWN DISPOSABLE) ×3 IMPLANT
GUIDEWIRE STR DUAL SENSOR (WIRE) ×3 IMPLANT
KIT TURNOVER KIT A (KITS) IMPLANT
MANIFOLD NEPTUNE II (INSTRUMENTS) ×3 IMPLANT
PACK CYSTO (CUSTOM PROCEDURE TRAY) ×3 IMPLANT
TUBING CONNECTING 10 (TUBING) IMPLANT
TUBING CONNECTING 10' (TUBING)

## 2019-03-07 NOTE — Evaluation (Signed)
Physical Therapy Evaluation Patient Details Name: Karen Rasmussen MRN: [id number] DOB: 08/03/1945 Today's Date: 03/07/2019   History of Present Illness  Patient is 74 y.o. female s/p Lt ureteroscopy/stent placement with past medical history of benign essential hypertension, chronic diastolic CHF and dilated cardiomyopathy, GERD, hyperlipidemia, history of nephrolithiasis, morbid obesity, OSA on CPAP, persistent atrial fibrillation, type 2 diabetes mellitus, vitamin D deficiency and other comorbidities.    Clinical Impression  Karen Rasmussen is 74 y.o. female admitted with above HPI and diagnosis. Patient is currently limited by functional impairments below (see PT problem list). Patient lives alone and is modified independent with RW and SPC at baseline. Patient reports gradual decline in strength over time as she has not been able to get to gym to exercise since COVID began. She has an exercise bike/Strider at home that she received ~ 1 week ago and is eager to begin using it. Patient will benefit from continued skilled PT interventions to address impairments and progress independence with mobility, recommending HHPT or OPPT for general strengthening and balance training. Acute PT will follow and progress as able.     Follow Up Recommendations Outpatient PT(pt would benefit from HHPT or OPPT for balance training and general strengthening)    Equipment Recommendations  None recommended by PT    Recommendations for Other Services       Precautions / Restrictions Precautions Precautions: Fall Restrictions Weight Bearing Restrictions: No      Mobility  Bed Mobility Overal bed mobility: Needs Assistance Bed Mobility: Supine to Sit;Sit to Supine     Supine to sit: HOB elevated;Supervision Sit to supine: Supervision;HOB elevated   General bed mobility comments: pt with uses bed rails and requries some effort to transfer supine<>sit, pt able to pull herself up in bed with bed rails to  reposition  Transfers Overall transfer level: Needs assistance Equipment used: Rolling walker (2 wheeled) Transfers: Sit to/from Stand Sit to Stand: Min guard         General transfer comment: pt requries bil UE use to initiate power up, educated on safe hand placement and technique however pt preferring to use hand on RW to begin stand. Pt usign grab bar to rise from toilet  Ambulation/Gait   Gait Distance (Feet): 20 Feet(2x10) Assistive device: Rolling walker (2 wheeled) Gait Pattern/deviations: Step-through pattern;Wide base of support;Trendelenburg;Decreased stride length Gait velocity: slow and unsteady   General Gait Details: pt declined to mobilize in hallway as she reports she would need to bathroom too often. She is unsteady with trendelenburg and reqrueid cues for safe use of RW and safe positioning of RW. Pt required ces for safety as she was rushing with getting to the toilet. No overt LOB noted.  Stairs            Wheelchair Mobility    Modified Rankin (Stroke Patients Only)       Balance Overall balance assessment: Needs assistance Sitting-balance support: Feet supported Sitting balance-Leahy Scale: Good     Standing balance support: During functional activity;Bilateral upper extremity supported Standing balance-Leahy Scale: Poor   Single Leg Stance - Right Leg: 0 Single Leg Stance - Left Leg: 0 Tandem Stance - Right Leg: 8 Tandem Stance - Left Leg: 7            Pertinent Vitals/Pain Pain Assessment: No/denies pain    Home Living Family/patient expects to be discharged to:: Private residence Living Arrangements: Alone Available Help at Discharge: Family;Available PRN/intermittently;Friend(s) Type of Home: House Home Access:  Stairs to enter Entrance Stairs-Rails: Right Entrance Stairs-Number of Steps: 3 steps steps from garage; pt has 1 step at front door Home Layout: One level Home Equipment: [place] - single point;Walker - 2  wheels Additional Comments: pt ahs RW and bariatric RW at home    Prior Function Level of Independence: Independent with assistive device(s)         Comments: pt ambulates with RW and occasionally SPC in home; she denie falls in the last year and reports she did fall ~ 2 years ago by tripping over clothes hangers.     Hand Dominance   Dominant Hand: Right    Extremity/Trunk Assessment   Upper Extremity Assessment Upper Extremity Assessment: Overall WFL for tasks assessed    Lower Extremity Assessment Lower Extremity Assessment: Generalized weakness    Cervical / Trunk Assessment Cervical / Trunk Assessment: Other exceptions Cervical / Trunk Exceptions: obese  Communication   Communication: No difficulties  Cognition Arousal/Alertness: Awake/alert Behavior During Therapy: WFL for tasks assessed/performed Overall Cognitive Status: Within Functional Limits for tasks assessed             General Comments General comments (skin integrity, edema, etc.): 5x Sit<>Stand Test: performed with Bil UE use for power up, 19/7 seconds.    Exercises     Assessment/Plan    PT Assessment Patient needs continued PT services  PT Problem List Decreased strength;Decreased balance;Decreased mobility;Decreased knowledge of use of DME;Decreased safety awareness;Decreased activity tolerance       PT Treatment Interventions DME instruction;Functional mobility training;Gait training;Therapeutic activities;Therapeutic exercise;Stair training;Balance training;Patient/family education    PT Goals (Current goals can be found in the Care Plan section)  Acute Rehab PT Goals Patient Stated Goal: to return home and start using her "Strider" for exercise PT Goal Formulation: With patient Time For Goal Achievement: 03/21/19 Potential to Achieve Goals: Good    Frequency Min 3X/week    AM-PAC PT "6 Clicks" Mobility  Outcome Measure Help needed turning from your back to your side while in a  flat bed without using bedrails?: None Help needed moving from lying on your back to sitting on the side of a flat bed without using bedrails?: None Help needed moving to and from a bed to a chair (including a wheelchair)?: A Little Help needed standing up from a chair using your arms (e.g., wheelchair or bedside chair)?: A Little Help needed to walk in hospital room?: A Little Help needed climbing 3-5 steps with a railing? : A Little 6 Click Score: 20    End of Session Equipment Utilized During Treatment: Gait belt Activity Tolerance: Patient tolerated treatment well Patient left: in bed;with call bell/phone within reach Nurse Communication: Mobility status PT Visit Diagnosis: Muscle weakness (generalized) (M62.81);Unsteadiness on feet (R26.81);Difficulty in walking, not elsewhere classified (R26.2)    Time: [id number] PT Time Calculation (min) (ACUTE ONLY): 22 min   Charges:   PT Evaluation $PT Eval Low Complexity: 1 Low          Verner Mould, DPT Physical Therapist with [facility] [phone]  03/07/2019 6:47 PM

## 2019-03-07 NOTE — H&P (Signed)
I have been asked to see the patient by Dr. Delora Fuel, for evaluation and management of left renal colic.  History of present illness: 74 year old woman who presents to the ER on 01/14/2019 found to have a left proximal 29m obstructing ureteral calculus with signs of sepsis. Patient underwent ureteral stent placement that same day and was treated in the hospital for poorly controlled diabetes as well as infection for several more days. Underwent left ureteroscopy and laser lithotripsy with stent placement yesterday.  In the afternoon she noticed excessive leakage.  She wasn't having any pain.  She wasn't sure what she should do and called 911.  In the ED her stent was noted to be through her urethra and it was removed.  She subsequently developed severe left flank pain. She hasn't had any fevers/chills.  She has been treated for a multi-drug resistant UTI.  Review of systems: A 12 point comprehensive review of systems was obtained and is negative unless otherwise stated in the history of present illness.  Patient Active Problem List   Diagnosis Date Noted  . Acute pyelonephritis 01/17/2019  . UTI due to extended-spectrum beta lactamase (ESBL) producing Escherichia coli   . Left nephrolithiasis 01/14/2019  . Hyperosmolar hyperglycemic state (HHS) ([facility] [place] 01/13/2019  . Acute renal injury ([facility]   . Urinary tract infection without hematuria   . Elevated troponin   . Person under investigation for COVID-19   . Preoperative clearance 11/02/2017  . Hypokalemia 09/23/2016  . Encounter for monitoring sotalol therapy 09/21/2016  . DCM (dilated cardiomyopathy) ([facility]   . Benign essential HTN 04/26/2016  . Persistent atrial fibrillation ([facility] 04/26/2016  . Morbid obesity with BMI of 40.0-44.9, adult ([facility]   . Chronic diastolic CHF (congestive heart failure) ([facility]   . Abdominal pain, left lower quadrant   . OSA (obstructive sleep apnea)   . Benign hypertensive heart disease without heart failure   .  GERD (gastroesophageal reflux disease)   . Hypersomnia   . Depression   . Asthmatic bronchitis   . Bursitis of hip     No current facility-administered medications on file prior to encounter.   Current Outpatient Medications on File Prior to Encounter  Medication Sig Dispense Refill  . acetaminophen (TYLENOL) 650 MG CR tablet Take 650 mg by mouth 2 (two) times daily as needed for pain.    .Marland Kitchenalendronate (FOSAMAX) 70 MG tablet Take 70 mg by mouth every Sunday. Take with a full glass of water on an empty stomach.     .Marland Kitchenaspirin EC 81 MG EC tablet Take 1 tablet (81 mg total) by mouth daily.    . Biotin w/ Vitamins C & E (HAIR SKIN & NAILS GUMMIES PO) Take 2 tablets by mouth daily.     . Calcium-Vitamin D-Vitamin K (CALCIUM + D + K PO) Take 1 tablet by mouth daily.    . carvedilol (COREG) 3.125 MG tablet Take 1 tablet (3.125 mg total) by mouth 2 (two) times daily with a meal. 180 tablet 3  . diphenhydrAMINE-zinc acetate (BENADRYL) cream Apply 1 application topically 3 (three) times daily as needed for itching.    . folic acid (FOLVITE) 1 MG tablet Take 1 tablet (1 mg total) by mouth daily. (Patient taking differently: Take 1 mg by mouth daily. 0.8 mg otc)    . folic acid (FOLVITE) 8720MCG tablet Take 800 mcg by mouth daily.    . furosemide (LASIX) 20 MG tablet Take 1 tablet (20 mg) Daily and then 20  mg PRN Daily for swelling. (Patient taking differently: Take 20 mg by mouth daily. ) 90 tablet 3  . HYDROcodone-acetaminophen (NORCO/VICODIN) 5-325 MG tablet Take 1 tablet by mouth every 8 (eight) hours as needed for moderate pain.     Marland Kitchen insulin aspart (NOVOLOG) 100 UNIT/ML FlexPen Inject 12 Units into the skin 3 (three) times daily with meals. 15 mL 1  . Insulin Glargine (BASAGLAR KWIKPEN [place]) Inject 34 Units into the skin daily.    . Misc Natural Products (GLUCOSAMINE CHONDROITIN TRIPLE) TABS Take 2 tablets by mouth daily.     . Multiple Vitamins-Minerals (CENTRUM SILVER) CHEW Chew 2 tablets by mouth  daily. soft chews    . oxybutynin (DITROPAN) 5 MG tablet Take 5 mg by mouth at bedtime.    . pantoprazole (PROTONIX) 40 MG tablet Take 40 mg by mouth daily.    . phenazopyridine (PYRIDIUM) 100 MG tablet Take 1 tablet (100 mg total) by mouth 3 (three) times daily as needed for pain. 12 tablet 0  . potassium chloride (K-DUR,KLOR-CON) 10 MEQ tablet TAKE 2 TABLETS BY MOUTH TWICE DAILY (Patient taking differently: Take 20 mEq by mouth 2 (two) times daily. ) 360 tablet 3  . psyllium (METAMUCIL SMOOTH TEXTURE) 28 % packet Take 1 packet by mouth 2 (two) times daily.     . rivaroxaban (XARELTO) 20 MG TABS tablet Take 1 tablet (20 mg total) by mouth every evening. 90 tablet 1  . simvastatin (ZOCOR) 10 MG tablet Take 1 tablet (10 mg total) by mouth at bedtime. 90 tablet 3  . sotalol (BETAPACE) 80 MG tablet TAKE 1 TABLET(80 MG) BY MOUTH EVERY 12 HOURS (Patient taking differently: Take 80 mg by mouth 2 (two) times daily. ) 245 tablet 3  . spironolactone (ALDACTONE) 25 MG tablet TAKE 1/2 TABLET (12.5MG    TOTAL) DAILY (Patient taking differently: Take 12.5 mg by mouth daily. ) 45 tablet 3  . tamsulosin (FLOMAX) 0.4 MG CAPS capsule Take 0.4 mg by mouth daily.    Marland Kitchen triamcinolone cream (KENALOG) 0.1 % Apply 1 application topically 2 (two) times daily as needed (itching legs).     . blood glucose meter kit and supplies Dispense based on patient and insurance preference. Use up to four times daily as directed. (FOR ICD-10 E10.9, E11.9). 1 each 0  . ciprofloxacin (CIPRO) 500 MG tablet Take 1 tablet (500 mg total) by mouth daily for 1 dose. 1 tablet 0  . Insulin Pen Needle 31G X 5 MM MISC 12 Units by Does not apply route 3 (three) times daily. 100 each 0  . Ipratropium-Albuterol (COMBIVENT RESPIMAT) 20-100 MCG/ACT AERS respimat Inhale 1 puff into the lungs every 6 (six) hours as needed for wheezing or shortness of breath. Use 3 times daily x 5 days, then every 6 hours as needed. 4 g 1    Past Medical History:   Diagnosis Date  . Abdominal pain, left lower quadrant   . Asthmatic bronchitis   . Benign essential HTN 04/26/2016  . Bursitis of hip   . Chronic diastolic CHF (congestive heart failure) ([place])   . DCM (dilated cardiomyopathy) ([place])    EF 45-50%  . GERD (gastroesophageal reflux disease)   . High cholesterol   . History of kidney stones   . Hypersomnia   . Morbid obesity with BMI of 50.0-59.9, adult ([place])   . OSA on CPAP   . Osteoarthritis    "right hip; both knees" (09/21/2016)  . Persistent atrial fibrillation ([place]) 04/26/2016  .  Type II diabetes mellitus ([place])   . Vitamin D deficiency disease     Past Surgical History:  Procedure Laterality Date  . ABDOMINAL HYSTERECTOMY    . APPENDECTOMY    . BALLOON DILATION N/A 11/17/2017   Procedure: BALLOON DILATION;  Surgeon: Ronnette Juniper, MD;  Location: [facility];  Service: Gastroenterology;  Laterality: N/A;  . BIOPSY  11/17/2017   Procedure: BIOPSY;  Surgeon: Ronnette Juniper, MD;  Location: [facility] ENDOSCOPY;  Service: Gastroenterology;;  . CARDIAC CATHETERIZATION    . CARDIOVERSION N/A 06/11/2016   Procedure: CARDIOVERSION;  Surgeon: Dorothy Spark, MD;  Location: [facility] ENDOSCOPY;  Service: Cardiovascular;  Laterality: N/A;  . CARDIOVERSION N/A 07/16/2016   Procedure: CARDIOVERSION;  Surgeon: Thayer Headings, MD;  Location[facility] ENDOSCOPY;  Service: Cardiovascular;  Laterality: N/A;  . CARDIOVERSION N/A 09/23/2016   Procedure: CARDIOVERSION;  Surgeon: Sanda Klein, MD;  Location: [facility] ENDOSCOPY;  Service: Cardiovascular;  Laterality: N/A;  . CHOLECYSTECTOMY OPEN    . COLONOSCOPY N/A 11/17/2017   Procedure: COLONOSCOPY;  Surgeon: Ronnette Juniper, MD;  Location: [facility] ENDOSCOPY;  Service: Gastroenterology;  Laterality: N/A;  . cortisone injection to right knee    . costisone injection to left knee  11/03/2017  . CYSTOSCOPY WITH RETROGRADE PYELOGRAM, URETEROSCOPY AND STENT PLACEMENT Left 01/14/2019   Procedure: CYSTOSCOPy  STENT PLACEMENT;  Surgeon:  Robley Fries, MD;  Location: [facility] ORS;  Service: Urology;  Laterality: Left;  . DILATION AND CURETTAGE OF UTERUS     S/P miscarriage  . ESOPHAGOGASTRODUODENOSCOPY N/A 11/17/2017   Procedure: ESOPHAGOGASTRODUODENOSCOPY (EGD);  Surgeon: Ronnette Juniper, MD;  Location: [facility];  Service: Gastroenterology;  Laterality: N/A;  . NASAL SEPTUM SURGERY    . POLYPECTOMY  11/17/2017   Procedure: POLYPECTOMY;  Surgeon: Ronnette Juniper, MD;  Location: [facility] ENDOS[facility];  Service: Gastroenterology;;  . TONSILLECTOMY      Social History   Tobacco Use  . Smoking status: Never Smoker  . Smokeless tobacco: Never Used  Substance Use Topics  . Alcohol use: Not Currently  . Drug use: No    Family History  Problem Relation Age of Onset  . Cancer Father   . Heart failure Father   . Diabetes Father   . CAD Mother   . Diabetes Brother   . Hypertension Brother   . Breast cancer Neg Hx     PE: Vitals:   03/07/19 0500 03/07/19 0600 03/07/19 0700 03/07/19 0825  BP: (!) 157/57 (!) 150/68 138/68 (!) 147/60  Pulse: 61 65 (!) 56 62  Resp: _0 Temp:    97.7 F (36.5 C)  TempSrc:    Oral  SpO2: 96% 91% 93% 93%  Weight:      Height:       Patient appears to be in no acute distress  patient is alert and oriented x3 Atraumatic normocephalic head No cervical or supraclavicular lymphadenopathy appreciated No increased work of breathing, no audible wheezes/rhonchi Regular sinus rhythm/rate Abdomen is soft, nontender, nondistended, no CVA or suprapubic tenderness Lower extremities are symmetric without appreciable edema Grossly neurologically intact No identifiable skin lesions  Recent Labs    03/06/19 0933  WBC 7.6  HGB 12.2  HCT 40.5   No results for input(s): NA, K, CL, CO2, GLUCOSE, BUN, CREATININE, CALCIUM in the last 72 hours. No results for input(s): LABPT, INR in the last 72 hours. No results for input(s): LABURIN in the last 72 hours. Results for orders placed or performed during  the hospital encounter  of 03/07/19  Respiratory Panel by RT PCR (Flu A&B, Covid) - Nasopharyngeal Swab     Status: None   Collection Time: 03/07/19  7:17 AM   Specimen: Nasopharyngeal Swab  Result Value Ref Range Status   SARS Coronavirus 2 by RT PCR NEGATIVE NEGATIVE Final    Comment: (NOTE) SARS-CoV-2 target nucleic acids are NOT DETECTED. The SARS-CoV-2 RNA is generally detectable in upper respiratoy specimens during the acute phase of infection. The lowest concentration of SARS-CoV-2 viral copies this assay can detect is 131 copies/mL. A negative result does not preclude SARS-Cov-2 infection and should not be used as the sole basis for treatment or other patient management decisions. A negative result may occur with  improper specimen collection/handling, submission of specimen other than nasopharyngeal swab, presence of viral mutation(s) within the areas targeted by this assay, and inadequate number of viral copies (<131 copies/mL). A negative result must be combined with clinical observations, patient history, and epidemiological information. The expected result is Negative. Fact Sheet for Patients:  PinkCheek.be Fact Sheet for Healthcare Providers:  GravelBags.it This test is not yet ap proved or cleared by the [place] FDA and  has been authorized for detection and/or diagnosis of SARS-CoV-2 by FDA under an Emergency Use Authorization (EUA). This EUA will remain  in effect (meaning this test can be used) for the duration of the COVID-19 declaration under Section 564(b)(1) of the Act, 21 U.S.C. section 360bbb-3(b)(1), unless the authorization is terminated or revoked sooner.    Influenza A by PCR NEGATIVE NEGATIVE Final   Influenza B by PCR NEGATIVE NEGATIVE Final    Comment: (NOTE) The Xpert Xpress SARS-CoV-2/FLU/RSV assay is intended as an aid in  the diagnosis of influenza from Nasopharyngeal swab specimens  and  should not be used as a sole basis for treatment. Nasal washings and  aspirates are unacceptable for Xpert Xpress SARS-CoV-2/FLU/RSV  testing. Fact Sheet for Patients: PinkCheek.be Fact Sheet for Healthcare Providers: GravelBags.it This test is not yet approved or cleared by the [place] FDA and  has been authorized for detection and/or diagnosis of SARS-CoV-2 by  FDA under an Emergency Use Authorization (EUA). This EUA will remain  in effect (meaning this test can be used) for the duration of the  Covid-19 declaration under Section 564(b)(1) of the Act, 21  U.S.C. section 360bbb-3(b)(1), unless the authorization is  terminated or revoked. Performed at [facility], [place] [address] [place], [place] [address]     Imaging: none  Imp: LEft renal colic for premature removal of stent resulting in swelling and pain in her left kidney.  Recommendations: proceed to OR for ureteral stent replacement.  R/B discussed with patient.  Will leave off stent tether and plan for her to have stent removed in the office in one week.  Discharge to home after procedure.   Ardis Hughs

## 2019-03-07 NOTE — Op Note (Signed)
Preoperative diagnosis:  1. Left renal colic  Postoperative diagnosis:  1. same   Procedure:  1. Cystoscopy 2. left ureteral stent placement 3. left retrograde pyelography with interpretation   Surgeon: Ardis Hughs, MD  Anesthesia: General  Complications: None  Intraoperative findings:  left retrograde pyelography demonstrated dilated ureter all the way to the UVJ.  No filling defects  EBL: Minimal  Specimens: None  Indication: Karen Rasmussen is a 74 y.o. patient with recent ureteroscopy/laser lithotripsy for an obstructing stone.  Her stent was removed inadvertently early and she developed renal colic.  After reviewing the management options for treatment, he elected to proceed with the above surgical procedure(s). We have discussed the potential benefits and risks of the procedure, side effects of the proposed treatment, the likelihood of the patient achieving the goals of the procedure, and any potential problems that might occur during the procedure or recuperation. Informed consent has been obtained.  Description of procedure:  The patient was taken to the operating room and general anesthesia was induced.  The patient was placed in the dorsal lithotomy position, prepped and draped in the usual sterile fashion, and preoperative antibiotics were administered. A preoperative time-out was performed.   Cystourethroscopy was performed.  The patient's urethra was examined and was normal. The bladder was then systematically examined in its entirety. There was no evidence for any bladder tumors, stones, or other mucosal pathology.    Attention then turned to the leftureteral orifice and a ureteral catheter was used to intubate the ureteral orifice.  Omnipaque contrast was injected through the ureteral catheter and a retrograde pyelogram was performed with findings as dictated above.  A 0.38 sensor guidewire was then advanced up the left ureter into the renal pelvis under  fluoroscopic guidance.  The wire was then backloaded through the cystoscope and a ureteral stent was advance over the wire using Seldinger technique.  The stent was positioned appropriately under fluoroscopic and cystoscopic guidance.  The wire was then removed with an adequate stent curl noted in the renal pelvis as well as in the bladder.  The bladder was then emptied and the procedure ended.  The patient appeared to tolerate the procedure well and without complications.  The patient was able to be awakened and transferred to the recovery unit in satisfactory condition.    Ardis Hughs, M.D.

## 2019-03-07 NOTE — Anesthesia Preprocedure Evaluation (Addendum)
Anesthesia Evaluation  Patient identified by MRN, date of birth, ID band Patient awake    Reviewed: Allergy & Precautions, H&P , NPO status , Patient's Chart, lab work & pertinent test results  Airway Mallampati: II   Neck ROM: full    Dental   Pulmonary asthma , sleep apnea ,    breath sounds clear to auscultation       Cardiovascular hypertension, +CHF   Rhythm:regular Rate:Normal  EF 55-60%   Neuro/Psych PSYCHIATRIC DISORDERS Depression    GI/Hepatic GERD  ,  Endo/Other  diabetes, Type 2Morbid obesity  Renal/GU stones     Musculoskeletal  (+) Arthritis ,   Abdominal   Peds  Hematology   Anesthesia Other Findings   Reproductive/Obstetrics                            Anesthesia Physical Anesthesia Plan  ASA: III  Anesthesia Plan: General   Post-op Pain Management:    Induction: Intravenous  PONV Risk Score and Plan: 3 and Ondansetron, Dexamethasone, Midazolam and Treatment may vary due to age or medical condition  Airway Management Planned: LMA  Additional Equipment:   Intra-op Plan:   Post-operative Plan:   Informed Consent: I have reviewed the patients History and Physical, chart, labs and discussed the procedure including the risks, benefits and alternatives for the proposed anesthesia with the patient or authorized representative who has indicated his/her understanding and acceptance.       Plan Discussed with: CRNA, Anesthesiologist and Surgeon  Anesthesia Plan Comments:         Anesthesia Quick Evaluation

## 2019-03-07 NOTE — ED Provider Notes (Signed)
[facility] DEPT Provider Note   CSN: [id number] Arrival date & time: 03/07/19  0138   History Chief Complaint  Patient presents with  . Post-op Problem    Karen Rasmussen is a 74 y.o. female.  The history is provided by the patient.  She has history of hypertension, diabetes, hyperlipidemia, heart failure and had laser lithotripsy with stent placement earlier today.  Since going home, she has had constant leakage of urine and blood.  She has saturated at least 10 pads.  She has minimal pain which she rates at 2/10.  She did call her urologist and the nurse practitioner advised her to check to see if there was anything more than a string protruding from her vagina.  She states she could feel some plastic so she was advised to come to the emergency department.  She denies fever or chills.  She denies nausea or vomiting.  Past Medical History:  Diagnosis Date  . Abdominal pain, left lower quadrant   . Asthmatic bronchitis   . Benign essential HTN 04/26/2016  . Bursitis of hip   . Chronic diastolic CHF (congestive heart failure) ([place])   . DCM (dilated cardiomyopathy) ([place])    EF 45-50%  . GERD (gastroesophageal reflux disease)   . High cholesterol   . History of kidney stones   . Hypersomnia   . Morbid obesity with BMI of 50.0-59.9, adult ([place])   . OSA on CPAP   . Osteoarthritis    "right hip; both knees" (09/21/2016)  . Persistent atrial fibrillation ([place]) 04/26/2016  . Type II diabetes mellitus ([place])   . Vitamin D deficiency disease     Patient Active Problem List   Diagnosis Date Noted  . Acute pyelonephritis 01/17/2019  . UTI due to extended-spectrum beta lactamase (ESBL) producing Escherichia coli   . Left nephrolithiasis 01/14/2019  . Hyperosmolar hyperglycemic state (HHS) ([place]) 01/13/2019  . Acute renal injury ([place])   . Urinary tract infection without hematuria   . Elevated troponin   . Person under investigation for COVID-19   .  Preoperative clearance 11/02/2017  . Hypokalemia 09/23/2016  . Encounter for monitoring sotalol therapy 09/21/2016  . DCM (dilated cardiomyopathy) ([place])   . Benign essential HTN 04/26/2016  . Persistent atrial fibrillation ([place]) 04/26/2016  . Morbid obesity with BMI of 40.0-44.9, adult ([place])   . Chronic diastolic CHF (congestive heart failure) ([place])   . Abdominal pain, left lower quadrant   . OSA (obstructive sleep apnea)   . Benign hypertensive heart disease without heart failure   . GERD (gastroesophageal reflux disease)   . Hypersomnia   . Depression   . Asthmatic bronchitis   . Bursitis of hip     Past Surgical History:  Procedure Laterality Date  . ABDOMINAL HYSTERECTOMY    . APPENDECTOMY    . BALLOON DILATION N/A 11/17/2017   Procedure: BALLOON DILATION;  Surgeon: Ronnette Juniper, MD;  Location: [facility];  Service: Gastroenterology;  Laterality: N/A;  . BIOPSY  11/17/2017   Procedure: BIOPSY;  Surgeon: Ronnette Juniper, MD;  Location: [facility] ENDOSCOPY;  Service: Gastroenterology;;  . CARDIAC CATHETERIZATION    . CARDIOVERSION N/A 06/11/2016   Procedure: CARDIOVERSION;  Surgeon: Dorothy Spark, MD;  Location: [facility] ENDOSCOPY;  Service: Cardiovascular;  Laterality: N/A;  . CARDIOVERSION N/A 07/16/2016   Procedure: CARDIOVERSION;  Surgeon: Thayer Headings, MD;  Location: [facility] ENDOSCOPY;  Service: Cardiovascular;  Laterality: N/A;  . CARDIOVERSION N/A 09/23/2016   Procedure: CARDIOVERSION;  Surgeon:  Croitoru, Dani Gobble, MD;  Location: [facility];  Service: Cardiovascular;  Laterality: N/A;  . CHOLECYSTECTOMY OPEN    . COLONOSCOPY N/A 11/17/2017   Procedure: COLONOSCOPY;  Surgeon: Ronnette Juniper, MD;  Location: [facility] ENDOSCOPY;  Service: Gastroenterology;  Laterality: N/A;  . cortisone injection to right knee    . costisone injection to left knee  11/03/2017  . CYSTOSCOPY WITH RETROGRADE PYELOGRAM, URETEROSCOPY AND STENT PLACEMENT Left 01/14/2019   Procedure: CYSTOSCOPy  STENT PLACEMENT;  Surgeon:  Robley Fries, MD;  Location: [facility] ORS;  Service: Urology;  Laterality: Left;  . DILATION AND CURETTAGE OF UTERUS     S/P miscarriage  . ESOPHAGOGASTRODUODENOSCOPY N/A 11/17/2017   Procedure: ESOPHAGOGASTRODUODENOSCOPY (EGD);  Surgeon: Ronnette Juniper, MD;  Location: [facility]OS[facility];  Service: Gastroenterology;  Laterality: N/A;  . NASAL SEPTUM SURGERY    . POLYPECTOMY  11/17/2017   Procedure: POLYPECTOMY;  Surgeon: Ronnette Juniper, MD;  Location: [facility] ENDOS[facility];  Service: Gastroenterology;;  . TONSILLECTOMY       OB History   No obstetric history on file.     Family History  Problem Relation Age of Onset  . Cancer Father   . Heart failure Father   . Diabetes Father   . CAD Mother   . Diabetes Brother   . Hypertension Brother   . Breast cancer Neg Hx     Social History   Tobacco Use  . Smoking status: Never Smoker  . Smokeless tobacco: Never Used  Substance Use Topics  . Alcohol use: Not Currently  . Drug use: No    Home Medications Prior to Admission medications   Medication Sig Start Date End Date Taking? Authorizing Provider  acetaminophen (TYLENOL) 650 MG CR tablet Take 650 mg by mouth 2 (two) times daily as needed for pain.    [provider]  alendronate (FOSAMAX) 70 MG tablet Take 70 mg by mouth every Sunday. Take with a full glass of water on an empty stomach.     [provider]  aspirin EC 81 MG EC tablet Take 1 tablet (81 mg total) by mouth daily. 01/21/19   Eugenie Filler, MD  Biotin w/ Vitamins C & E (HAIR SKIN & NAILS GUMMIES PO) Take 2 tablets by mouth daily.     [provider]  blood glucose meter kit and supplies Dispense based on patient and insurance preference. Use up to four times daily as directed. (FOR ICD-10 E10.9, E11.9). 01/20/19   Eugenie Filler, MD  Calcium-Vitamin D-Vitamin K (CALCIUM + D + K PO) Take 1 tablet by mouth daily.    [provider]  carvedilol (COREG) 3.125 MG tablet Take 1 tablet (3.125 mg  total) by mouth 2 (two) times daily with a meal. 04/04/18 03/30/19  Turner, Eber Hong, MD  ciprofloxacin (CIPRO) 500 MG tablet Take 1 tablet (500 mg total) by mouth daily for 1 dose. 03/06/19 03/07/19  Robley Fries, MD  diphenhydrAMINE-zinc acetate (BENADRYL) cream Apply 1 application topically 3 (three) times daily as needed for itching.    [provider]  folic acid (FOLVITE) 1 MG tablet Take 1 tablet (1 mg total) by mouth daily. Patient taking differently: Take 1 mg by mouth daily. 0.8 mg otc 01/21/19   Eugenie Filler, MD  folic acid (FOLVITE) 423 MCG tablet Take 800 mcg by mouth daily.    [provider]  furosemide (LASIX) 20 MG tablet Take 1 tablet (20 mg) Daily and then 20 mg PRN Daily for swelling. Patient  taking differently: Take 20 mg by mouth daily.  02/23/19   [name], MD  HYDROcodone-acetaminophen (NORCO/VICODIN) 5-325 MG tablet Take 1 tablet by mouth every 8 (eight) hours as needed for moderate pain.     [provider]  insulin aspart (NOVOLOG) 100 UNIT/ML FlexPen Inject 12 Units into the skin 3 (three) times daily with meals. 01/20/19   Eugenie Filler, MD  Insulin Glargine [facility] KWIKPEN [place]) Inject 34 Units into the skin daily.    [provider]  Insulin Pen Needle 31G X 5 MM MISC 12 Units by Does not apply route 3 (three) times daily. 01/20/19   Eugenie Filler, MD  Ipratropium-Albuterol (COMBIVENT RESPIMAT) 20-100 MCG/ACT AERS respimat Inhale 1 puff into the lungs every 6 (six) hours as needed for wheezing or shortness of breath. Use 3 times daily x 5 days, then every 6 hours as needed. 01/20/19   Eugenie Filler, MD  Misc Natural Products (GLUCOSAMINE CHONDROITIN TRIPLE) TABS Take 2 tablets by mouth daily.     [provider]  Multiple Vitamins-Minerals (CENTRUM SILVER) CHEW Chew 2 tablets by mouth daily. soft chews    [provider]  oxybutynin (DITROPAN) 5 MG tablet Take 5 mg by mouth at bedtime.     [provider]  pantoprazole (PROTONIX) 40 MG tablet Take 40 mg by mouth daily.    [provider]  phenazopyridine (PYRIDIUM) 100 MG tablet Take 1 tablet (100 mg total) by mouth 3 (three) times daily as needed for pain. 03/06/19 03/05/20  Robley Fries, MD  potassium chloride (K-DUR,KLOR-CON) 10 MEQ tablet TAKE 2 TABLETS BY MOUTH TWICE DAILY Patient taking differently: Take 20 mEq by mouth 2 (two) times daily.  04/04/18   [name], MD  psyllium (METAMUCIL SMOOTH TEXTURE) 28 % packet Take 1 packet by mouth 2 (two) times daily.     [provider]  rivaroxaban (XARELTO) 20 MG TABS tablet Take 1 tablet (20 mg total) by mouth every evening. 02/23/19 02/23/20  [name], MD  simvastatin (ZOCOR) 10 MG tablet Take 1 tablet (10 mg total) by mouth at bedtime. 04/04/18 04/04/19  [name], MD  sotalol (BETAPACE) 80 MG tablet TAKE 1 TABLET(80 MG) BY MOUTH EVERY 12 HOURS Patient taking differently: Take 80 mg by mouth 2 (two) times daily.  04/04/18   [name], MD  spironolactone (ALDACTONE) 25 MG tablet TAKE 1/2 TABLET (12.5MG    TOTAL) DAILY Patient taking differently: Take 12.5 mg by mouth daily.  10/30/18   [name], MD  tamsulosin (FLOMAX) 0.4 MG CAPS capsule Take 0.4 mg by mouth daily.    [provider]  triamcinolone cream (KENALOG) 0.1 % Apply 1 application topically 2 (two) times daily as needed (itching legs).  12/15/18   [provider]    Allergies    No healthtouch food allergies  Review of Systems   Review of Systems  All other systems reviewed and are negative.   Physical Exam Updated Vital Signs BP (!) 142/61   Pulse 69   Temp 97.8 F (36.6 C) (Oral)   Ht 5' 2"  (1.575 m)   Wt 127 kg   SpO2 96%   BMI 51.21 kg/m   Physical Exam Vitals and nursing note reviewed.   Morbidly obese 74 year old female, resting comfortably and in no acute distress. Vital signs are significant for mildly elevated blood  pressure. Oxygen saturation is 96%, which is normal. Head is normocephalic  and atraumatic. PERRLA, EOMI. Oropharynx is clear. Neck is nontender and supple without adenopathy or JVD. Back is nontender and there is no CVA tenderness. Lungs are clear without rales, wheezes, or rhonchi. Chest is nontender. Heart has regular rate and rhythm without murmur. Abdomen is soft, flat, nontender without masses or hepatosplenomegaly and peristalsis is normoactive. Genitalia: Normal external female genitalia.  String from stent is noted with approximately 2 cm of stent protruding beyond the vaginal introitus. Extremities have 2+ edema, full range of motion is present. Skin is warm and dry without rash. Neurologic: Mental status is normal, cranial nerves are intact, there are no motor or sensory deficits.  ED Results / Procedures / Treatments    Procedures Procedures  Medications Ordered in ED Medications - No data to display  ED Course  I have reviewed the triage vital signs and the nursing notes.  MDM Rules/Calculators/A&P Malposition of left ureteral stent.  I have discussed the case with Dr. Louis Meckel, on-call for urology.  Patient is anticoagulated on rivaroxaban which is a complicating factor.  He recommends pulling the stent out and seeing if she develops renal colic.  If she does, she will need to have stent replaced.  Stent was removed easily and patient will be observed in the emergency department.  Old records reviewed confirming laser lithotripsy with stent placement earlier today.  Unfortunately, she developed significant left flank pain consistent with renal colic and required multiple doses of hydromorphone for pain control.  Case was again discussed with Dr. Louis Meckel who will make arrangements for OR time for stent placement today.  Final Clinical Impression(s) / ED Diagnoses Final diagnoses:  Ureteral stent displacement, initial encounter [facility])  Renal colic    Rx / DC Orders ED  Discharge Orders    None       Delora Fuel, MD 16/74/25 [phone]

## 2019-03-07 NOTE — ED Notes (Signed)
Pt transported with 3 bags of belongings to OR with OR staff.

## 2019-03-07 NOTE — Anesthesia Postprocedure Evaluation (Signed)
Anesthesia Post Note  Patient: Karen Rasmussen  Procedure(s) Performed: CYSTOSCOPY WITH , URETEROSCOPY/ STENT PLACEMENT (Left Urethra)     Patient location during evaluation: PACU Anesthesia Type: General Level of consciousness: awake and alert Pain management: pain level controlled Vital Signs Assessment: post-procedure vital signs reviewed and stable Respiratory status: spontaneous breathing, nonlabored ventilation, respiratory function stable and patient connected to nasal cannula oxygen Cardiovascular status: blood pressure returned to baseline and stable Postop Assessment: no apparent nausea or vomiting Anesthetic complications: no    Last Vitals:  Vitals:   03/07/19 1000 03/07/19 1015  BP: (!) 128/58 (!) 132/49  Pulse: 65 (!) 54  Resp: 16 17  Temp:    SpO2: 94% 94%    Last Pain:  Vitals:   03/07/19 1000  TempSrc:   PainSc: 0-No pain                 Tiajuana Amass

## 2019-03-07 NOTE — Anesthesia Procedure Notes (Signed)
Procedure Name: LMA Insertion Date/Time: 03/07/2019 8:58 AM Performed by: Sharlette Dense, CRNA Patient Re-evaluated:Patient Re-evaluated prior to induction Oxygen Delivery Method: Circle system utilized Preoxygenation: Pre-oxygenation with 100% oxygen Induction Type: IV induction Ventilation: Mask ventilation without difficulty LMA: LMA inserted LMA Size: 4.0 Number of attempts: 1 Placement Confirmation: positive ETCO2 and breath sounds checked- equal and bilateral Tube secured with: Tape Dental Injury: Teeth and Oropharynx as per pre-operative assessment

## 2019-03-07 NOTE — ED Triage Notes (Signed)
[facility] transported pt from home to [facility] ED and reports the following:  Pt had a procedure the morning of 1/26. She said, "A stent placed in my kidney." Increased incontinence and bleeding (2-3 large maxi pads per hour) since morning of 1/26. Lower abdominal pain 3/10. Ttakes coumadin. Diabetic. Started insulin during last hospital stay.

## 2019-03-07 NOTE — Transfer of Care (Signed)
Immediate Anesthesia Transfer of Care Note  Patient: Karen Rasmussen  Procedure(s) Performed: CYSTOSCOPY WITH , URETEROSCOPY/ STENT PLACEMENT (Left Urethra)  Patient Location: PACU  Anesthesia Type:General  Level of Consciousness: awake, alert  and oriented  Airway & Oxygen Therapy: Patient Spontanous Breathing and Patient connected to face mask oxygen  Post-op Assessment: Report given to RN and Post -op Vital signs reviewed and stable  Post vital signs: Reviewed and stable  Last Vitals:  Vitals Value Taken Time  BP    Temp    Pulse 70 03/07/19 0933  Resp 16 03/07/19 0933  SpO2 100 % 03/07/19 0933  Vitals shown include unvalidated device data.  Last Pain:  Vitals:   03/07/19 0825  TempSrc: Oral  PainSc:          Complications: No apparent anesthesia complications

## 2019-03-07 NOTE — Telephone Encounter (Signed)
Open in Error.

## 2019-03-08 DIAGNOSIS — Z20822 Contact with and (suspected) exposure to covid-19: Secondary | ICD-10-CM | POA: Diagnosis not present

## 2019-03-08 DIAGNOSIS — I4819 Other persistent atrial fibrillation: Secondary | ICD-10-CM | POA: Diagnosis not present

## 2019-03-08 DIAGNOSIS — G4733 Obstructive sleep apnea (adult) (pediatric): Secondary | ICD-10-CM | POA: Diagnosis not present

## 2019-03-08 DIAGNOSIS — I110 Hypertensive heart disease with heart failure: Secondary | ICD-10-CM | POA: Diagnosis not present

## 2019-03-08 DIAGNOSIS — K219 Gastro-esophageal reflux disease without esophagitis: Secondary | ICD-10-CM | POA: Diagnosis not present

## 2019-03-08 DIAGNOSIS — E119 Type 2 diabetes mellitus without complications: Secondary | ICD-10-CM | POA: Diagnosis not present

## 2019-03-08 DIAGNOSIS — E7800 Pure hypercholesterolemia, unspecified: Secondary | ICD-10-CM | POA: Diagnosis not present

## 2019-03-08 DIAGNOSIS — R2681 Unsteadiness on feet: Secondary | ICD-10-CM | POA: Diagnosis not present

## 2019-03-08 DIAGNOSIS — I420 Dilated cardiomyopathy: Secondary | ICD-10-CM | POA: Diagnosis not present

## 2019-03-08 DIAGNOSIS — F329 Major depressive disorder, single episode, unspecified: Secondary | ICD-10-CM | POA: Diagnosis not present

## 2019-03-08 DIAGNOSIS — I5032 Chronic diastolic (congestive) heart failure: Secondary | ICD-10-CM | POA: Diagnosis not present

## 2019-03-08 DIAGNOSIS — N23 Unspecified renal colic: Secondary | ICD-10-CM | POA: Diagnosis not present

## 2019-03-08 LAB — GLUCOSE, CAPILLARY
Glucose-Capillary: 154 mg/dL — ABNORMAL HIGH (ref 70–99)
Glucose-Capillary: 159 mg/dL — ABNORMAL HIGH (ref 70–99)
Glucose-Capillary: 159 mg/dL — ABNORMAL HIGH (ref 70–99)
Glucose-Capillary: 177 mg/dL — ABNORMAL HIGH (ref 70–99)
Glucose-Capillary: 187 mg/dL — ABNORMAL HIGH (ref 70–99)

## 2019-03-08 MED ORDER — HYDROCODONE-ACETAMINOPHEN 5-325 MG PO TABS: 1.0000 | ORAL_TABLET | ORAL | 0 refills | Status: AC | PRN

## 2019-03-08 MED ORDER — TAMSULOSIN HCL 0.4 MG PO CAPS: 0.4000 mg | ORAL_CAPSULE | Freq: Every day | ORAL | 0 refills | Status: AC

## 2019-03-08 NOTE — Progress Notes (Signed)
Physical Therapy Treatment Patient Details Name: Karen Rasmussen MRN: [id number] DOB: 1945-04-01 Today's Date: 03/08/2019    History of Present Illness Patient is 74 y.o. female s/p Lt ureteroscopy/stent placement with past medical history of benign essential hypertension, chronic diastolic CHF and dilated cardiomyopathy, GERD, hyperlipidemia, history of nephrolithiasis, morbid obesity, OSA on CPAP, persistent atrial fibrillation, type 2 diabetes mellitus, vitamin D deficiency and other comorbidities.    PT Comments    Pt ambulated in room and mobilizing well with RW.  Pt states she plans to have HHPT to assist her with balance, mobility and strengthening upon d/c.  Pt anticipates d/c home later today.   Follow Up Recommendations  Home health PT(pt agreeable to HHPT)     Equipment Recommendations  None recommended by PT    Recommendations for Other Services       Precautions / Restrictions Precautions Precautions: Fall Restrictions Weight Bearing Restrictions: No    Mobility  Bed Mobility Overal bed mobility: Needs Assistance Bed Mobility: Supine to Sit       Sit to supine: Supervision   General bed mobility comments: pt with uses bed rails and requries some effort to transfer likely due to body habitus  Transfers Overall transfer level: Needs assistance Equipment used: Rolling walker (2 wheeled) Transfers: Sit to/from Stand Sit to Stand: Min guard         General transfer comment: utilizes bil UE assist and momentum (from surfaces without armrests)  Ambulation/Gait Ambulation/Gait assistance: Min guard Gait Distance (Feet): 100 Feet Assistive device: Rolling walker (2 wheeled) Gait Pattern/deviations: Step-through pattern;Wide base of support;Decreased stride length;Trendelenburg     General Gait Details: pt declined to mobilize in hallway (unable to fit into mesh undergarments from hospital and wanted a pad) so remained in room. no LOB, trendelenburg gait  likely from body habitus   Stairs             Wheelchair Mobility    Modified Rankin (Stroke Patients Only)       Balance                                            Cognition Arousal/Alertness: Awake/alert Behavior During Therapy: WFL for tasks assessed/performed Overall Cognitive Status: Within Functional Limits for tasks assessed                                        Exercises      General Comments        Pertinent Vitals/Pain Pain Assessment: No/denies pain    Home Living                      Prior Function            PT Goals (current goals can now be found in the care plan section) Progress towards PT goals: Progressing toward goals    Frequency    Min 3X/week      PT Plan Current plan remains appropriate    Co-evaluation              AM-PAC PT "6 Clicks" Mobility   Outcome Measure  Help needed turning from your back to your side while in a flat bed without using bedrails?: None Help needed moving from lying on your back  to sitting on the side of a flat bed without using bedrails?: None Help needed moving to and from a bed to a chair (including a wheelchair)?: A Little Help needed standing up from a chair using your arms (e.g., wheelchair or bedside chair)?: A Little Help needed to walk in hospital room?: A Little Help needed climbing 3-5 steps with a railing? : A Little 6 Click Score: 20    End of Session   Activity Tolerance: Patient tolerated treatment well Patient left: in chair;with call bell/phone within reach   PT Visit Diagnosis: Muscle weakness (generalized) (M62.81);Unsteadiness on feet (R26.81);Difficulty in walking, not elsewhere classified (R26.2)     Time: [id number] PT Time Calculation (min) (ACUTE ONLY): 15 min  Charges:  $Gait Training: 8-22 mins                     Arlyce Dice, DPT Acute Rehabilitation Services Office: [phone]  Trena Platt 03/08/2019, 12:58 PM

## 2019-03-08 NOTE — Progress Notes (Signed)
Pt complaining of 10/10 midsternal chest pain radiating under each breast. EKG obtained and placed in chart. Pt is not SOB, and is not appear in acute distress. MD Pace paged, awaiting response.

## 2019-03-08 NOTE — Discharge Summary (Signed)
Date of admission: 03/07/2019  Date of discharge: 03/08/2019  Admission diagnosis: left ureteral stent migration  Discharge diagnosis: left renal colic secondary to ureteral stent removal  Secondary diagnoses:  Patient Active Problem List   Diagnosis Date Noted  . Nephrolithiasis 03/07/2019  . Acute pyelonephritis 01/17/2019  . UTI due to extended-spectrum beta lactamase (ESBL) producing Escherichia coli   . Left nephrolithiasis 01/14/2019  . Hyperosmolar hyperglycemic state (HHS) ([place]) 01/13/2019  . Acute renal injury ([place])   . Urinary tract infection without hematuria   . Elevated troponin   . Person under investigation for COVID-19   . Preoperative clearance 11/02/2017  . Hypokalemia 09/23/2016  . Encounter for monitoring sotalol therapy 09/21/2016  . DCM (dilated cardiomyopathy) ([place])   . Benign essential HTN 04/26/2016  . Persistent atrial fibrillation ([place]) 04/26/2016  . Morbid obesity with BMI of 40.0-44.9, adult ([place])   . Chronic diastolic CHF (congestive heart failure) ([place])   . Abdominal pain, left lower quadrant   . OSA (obstructive sleep apnea)   . Benign hypertensive heart disease without heart failure   . GERD (gastroesophageal reflux disease)   . Hypersomnia   . Depression   . Asthmatic bronchitis   . Bursitis of hip     Procedures performed: Procedure(s): CYSTOSCOPY WITH , URETEROSCOPY/ STENT PLACEMENT  History and Physical: For full details, please see admission history and physical. Briefly, Karen Rasmussen is a 74 y.o. year old patient who presented to ED in December with UTI and 7 mm obstructing left ureteral calculus.  Pt underwent stent placement at that time and returned to OR for ambulatory ureteroscopy and stone removal on 03/06/19.    Hospital course: Patient's stent became dislodged once she was discharged and she came back to ER.  In ER the remaining part of the stent was removed and she developed renal colic.  A left ureteral stent was replaced on  03/07/19.  Pt was admitted due to living alone and difficulty ambulating after surgery.  Patient tolerated the procedure well.  She was then transferred to the floor after an uneventful PACU stay.  Her hospital course was uncomplicated.  On POD#1 she had met discharge criteria: was eating a regular diet, was up and ambulating independently,  pain was well controlled, was voiding without a catheter, and was ready to for discharge.   Laboratory values:  Recent Labs    03/06/19 0933  WBC 7.6  HGB 12.2  HCT 40.5   No results for input(s): NA, K, CL, CO2, GLUCOSE, BUN, CREATININE, CALCIUM in the last 72 hours. No results for input(s): LABPT, INR in the last 72 hours. No results for input(s): LABURIN in the last 72 hours. Results for orders placed or performed during the hospital encounter of 03/07/19  Respiratory Panel by RT PCR (Flu A&B, Covid) - Nasopharyngeal Swab     Status: None   Collection Time: 03/07/19  7:17 AM   Specimen: Nasopharyngeal Swab  Result Value Ref Range Status   SARS Coronavirus 2 by RT PCR NEGATIVE NEGATIVE Final    Comment: (NOTE) SARS-CoV-2 target nucleic acids are NOT DETECTED. The SARS-CoV-2 RNA is generally detectable in upper respiratoy specimens during the acute phase of infection. The lowest concentration of SARS-CoV-2 viral copies this assay can detect is 131 copies/mL. A negative result does not preclude SARS-Cov-2 infection and should not be used as the sole basis for treatment or other patient management decisions. A negative result may occur with  improper specimen collection/handling, submission of specimen  other than nasopharyngeal swab, presence of viral mutation(s) within the areas targeted by this assay, and inadequate number of viral copies (<131 copies/mL). A negative result must be combined with clinical observations, patient history, and epidemiological information. The expected result is Negative. Fact Sheet for Patients:   PinkCheek.be Fact Sheet for Healthcare Providers:  GravelBags.it This test is not yet ap proved or cleared by the [place] FDA and  has been authorized for detection and/or diagnosis of SARS-CoV-2 by FDA under an Emergency Use Authorization (EUA). This EUA will remain  in effect (meaning this test can be used) for the duration of the COVID-19 declaration under Section 564(b)(1) of the Act, 21 U.S.C. section 360bbb-3(b)(1), unless the authorization is terminated or revoked sooner.    Influenza A by PCR NEGATIVE NEGATIVE Final   Influenza B by PCR NEGATIVE NEGATIVE Final    Comment: (NOTE) The Xpert Xpress SARS-CoV-2/FLU/RSV assay is intended as an aid in  the diagnosis of influenza from Nasopharyngeal swab specimens and  should not be used as a sole basis for treatment. Nasal washings and  aspirates are unacceptable for Xpert Xpress SARS-CoV-2/FLU/RSV  testing. Fact Sheet for Patients: PinkCheek.be Fact Sheet for Healthcare Providers: GravelBags.it This test is not yet approved or cleared by the [place] FDA and  has been authorized for detection and/or diagnosis of SARS-CoV-2 by  FDA under an Emergency Use Authorization (EUA). This EUA will remain  in effect (meaning this test can be used) for the duration of the  Covid-19 declaration under Section 564(b)(1) of the Act, 21  U.S.C. section 360bbb-3(b)(1), unless the authorization is  terminated or revoked. Performed at [facility], [place] [address] [place], [place] [address]     Disposition: Home  Discharge instruction: The patient was instructed to be ambulatory but told to refrain from heavy lifting, strenuous activity, or driving.   Discharge medications:  flomax Hydrocodone-acetaminophen Docusate-senna  Followup:  Patient will have a follow up at [facility] next week for  stent removal

## 2019-03-08 NOTE — TOC Initial Note (Signed)
Transition of Care [facility]) - Initial/Assessment Note    Patient Details  Name: Karen Rasmussen MRN: [id number] Date of Birth: 09-17-1945  Transition of [facility]) CM/SW Contact:    Dessa Phi, RN Phone Number: 03/08/2019, 10:00 AM  Clinical Narrative:  PT recc HHPT vs OPPT-patient prefers HHPT-[facility] rep Cory aware of d/c. Awaiting HHPT order,& face to face-MD being notified.               Barriers to Discharge: No Barriers Identified   Patient Goals and CMS Choice   CMS Medicare.gov Compare Post Acute Care list provided to:: Patient Choice offered to / list presented to : Patient  Expected Discharge Plan and Services     Discharge Planning Services: CM Consult   Living arrangements for the past 2 months: [place] Expected Discharge Date: 03/08/19                         HH Arranged: PT HH Agency: [place] Date [place]: 03/08/19 Time [place]: [phone] Representative spoke with at [place]: Tommi Rumps  Prior Living Arrangements/Services Living arrangements for the past 2 months: [place] with:: Adult Children Patient language and need for interpreter reviewed:: Yes Do you feel safe going back to the place where you live?: Yes      Need for Family Participation in Patient Care: Yes (Comment)   Current home services: DME(rw) Criminal Activity/Legal Involvement Pertinent to Current Situation/Hospitalization: No - Comment as needed  Activities of Daily Living Home Assistive Devices/Equipment: Environmental consultant (specify type) ADL Screening (condition at time of admission) Patient's cognitive ability adequate to safely complete daily activities?: Yes Is the patient deaf or have difficulty hearing?: No Does the patient have difficulty seeing, even when wearing glasses/contacts?: No Does the patient have difficulty concentrating, remembering, or making decisions?: No Patient able to express need for assistance with ADLs?:  Yes Does the patient have difficulty dressing or bathing?: No Independently performs ADLs?: Yes (appropriate for developmental age) Does the patient have difficulty walking or climbing stairs?: No Weakness of Legs: None Weakness of Arms/Hands: None  Permission Sought/Granted Permission sought to share information with : Case Manager Permission granted to share information with : Yes, Verbal Permission Granted  Share Information with NAME: Case Manager  Permission granted to share info w AGENCY: [place] agencies        Emotional Assessment Appearance:: Appears stated age Attitude/Demeanor/Rapport: Gracious Affect (typically observed): Accepting Orientation: : Oriented to Self, Oriented to  Time, Oriented to Place, Oriented to Situation Alcohol / Substance Use: Not Applicable Psych Involvement: No (comment)  Admission diagnosis:  Renal colic 123XX123 Ureteral stent displacement, initial encounter ([place]) [T83.122A] Nephrolithiasis [N20.0] Patient Active Problem List   Diagnosis Date Noted  . Nephrolithiasis 03/07/2019  . Acute pyelonephritis 01/17/2019  . UTI due to extended-spectrum beta lactamase (ESBL) producing Escherichia coli   . Left nephrolithiasis 01/14/2019  . Hyperosmolar hyperglycemic state (HHS) ([place]) 01/13/2019  . Acute renal injury ([place])   . Urinary tract infection without hematuria   . Elevated troponin   . Person under investigation for COVID-19   . Preoperative clearance 11/02/2017  . Hypokalemia 09/23/2016  . Encounter for monitoring sotalol therapy 09/21/2016  . DCM (dilated cardiomyopathy) ([place])   . Benign essential HTN 04/26/2016  . Persistent atrial fibrillation ([place]) 04/26/2016  . Morbid obesity with BMI of 40.0-44.9, adult ([place])   . Chronic diastolic CHF (congestive heart failure) ([place])   .  Abdominal pain, left lower quadrant   . OSA (obstructive sleep apnea)   . Benign hypertensive heart disease without heart failure   . GERD (gastroesophageal reflux  disease)   . Hypersomnia   . Depression   . Asthmatic bronchitis   . Bursitis of hip    PCP:  Kathyrn Lass, MD Pharmacy:   [id number] - [place], [place]. AT [address] [place] [address] Phone: [phone] Fax: [phone]     Social Determinants of Health (SDOH) Interventions    Readmission Risk Interventions No flowsheet data found.

## 2019-03-09 DIAGNOSIS — N10 Acute tubulo-interstitial nephritis: Secondary | ICD-10-CM | POA: Diagnosis not present

## 2019-03-11 ENCOUNTER — Ambulatory Visit: Payer: Medicare Other

## 2019-03-12 ENCOUNTER — Other Ambulatory Visit: Payer: Self-pay | Admitting: Physician Assistant

## 2019-03-12 DIAGNOSIS — N201 Calculus of ureter: Secondary | ICD-10-CM | POA: Diagnosis not present

## 2019-03-12 DIAGNOSIS — R1319 Other dysphagia: Secondary | ICD-10-CM

## 2019-03-12 DIAGNOSIS — R1310 Dysphagia, unspecified: Secondary | ICD-10-CM

## 2019-03-14 ENCOUNTER — Ambulatory Visit (INDEPENDENT_AMBULATORY_CARE_PROVIDER_SITE_OTHER): Payer: Medicare Other | Admitting: Physician Assistant

## 2019-03-14 ENCOUNTER — Telehealth: Payer: Self-pay | Admitting: Cardiology

## 2019-03-14 ENCOUNTER — Other Ambulatory Visit: Payer: Self-pay

## 2019-03-14 ENCOUNTER — Ambulatory Visit (HOSPITAL_COMMUNITY)
Admission: RE | Admit: 2019-03-14 | Discharge: 2019-03-14 | Disposition: A | Discharge status: Home / Self Care | Payer: Medicare Other | Source: Ambulatory Visit | Attending: Vascular Surgery | Admitting: Vascular Surgery

## 2019-03-14 ENCOUNTER — Encounter (HOSPITAL_COMMUNITY): Payer: Medicare Other

## 2019-03-14 VITALS — BP 147/93 | HR 71 | Temp 97.2°F | Resp 16 | Ht 62.0 in | Wt 276.0 lb

## 2019-03-14 DIAGNOSIS — R21 Rash and other nonspecific skin eruption: Secondary | ICD-10-CM

## 2019-03-14 DIAGNOSIS — I83893 Varicose veins of bilateral lower extremities with other complications: Secondary | ICD-10-CM

## 2019-03-14 NOTE — Telephone Encounter (Signed)
New message   Patient states that her heart did not sound like it was in rhythm per Dr. Gerri Lins [facility]. Please call to discuss.

## 2019-03-14 NOTE — Progress Notes (Signed)
[facility] [place]   Reason for referral: left LE skin changes  History of Present Illness  Karen Rasmussen is a 74 y.o. female who presents with chief complaint:erythema superficial skin changes/rrash Patient notes, onset of skin changes with erythema > 1 month ago, associated with itching.  The patient has had no  history of DVT, no history of varicose vein, no history of venous stasis ulcers, no history of  Lymphedema and no history of skin changes in lower legs.  There is no family history of venous disorders.  The patient has  used over the counter compression stockings in the past.  Past Medical History:  Diagnosis Date  . Abdominal pain, left lower quadrant   . Asthmatic bronchitis   . Benign essential HTN 04/26/2016  . Bursitis of hip   . Chronic diastolic CHF (congestive heart failure) ([place])   . DCM (dilated cardiomyopathy) ([place])    EF 45-50%  . GERD (gastroesophageal reflux disease)   . High cholesterol   . History of kidney stones   . Hypersomnia   . Morbid obesity with BMI of 50.0-59.9, adult ([place])   . OSA on CPAP   . Osteoarthritis    "right hip; both knees" (09/21/2016)  . Persistent atrial fibrillation ([place]) 04/26/2016  . Type II diabetes mellitus ([place])   . Vitamin D deficiency disease     Past Surgical History:  Procedure Laterality Date  . ABDOMINAL HYSTERECTOMY    . APPENDECTOMY    . BALLOON DILATION N/A 11/17/2017   Procedure: BALLOON DILATION;  Surgeon: Ronnette Juniper, MD;  Location: [facility];  Service: Gastroenterology;  Laterality: N/A;  . BIOPSY  11/17/2017   Procedure: BIOPSY;  Surgeon: Ronnette Juniper, MD;  Location: [facility];  Service: Gastroenterology;;  . CARDIAC CATHETERIZATION    . CARDIOVERSION N/A 06/11/2016   Procedure: CARDIOVERSION;  Surgeon: Dorothy Spark, MD;  Location: [facility] ENDOSCOPY;  Service: Cardiovascular;  Laterality: N/A;  . CARDIOVERSION N/A 07/16/2016   Procedure: CARDIOVERSION;  Surgeon: Thayer Headings, MD;   Location: [facility] ENDOSCOPY;  Service: Cardiovascular;  Laterality: N/A;  . CARDIOVERSION N/A 09/23/2016   Procedure: CARDIOVERSION;  Surgeon: Sanda Klein, MD;  Location: [facility] ENDOSCOPY;  Service: Cardiovascular;  Laterality: N/A;  . CHOLECYSTECTOMY OPEN    . COLONOSCOPY N/A 11/17/2017   Procedure: COLONOSCOPY;  Surgeon: Ronnette Juniper, MD;  Location: [facility];  Service: Gastroenterology;  Laterality: N/A;  . cortisone injection to right knee    . costisone injection to left knee  11/03/2017  . CYSTOSCOPY WITH RETROGRADE PYELOGRAM, URETEROSCOPY AND STENT PLACEMENT Left 01/14/2019   Procedure: CYSTOSCOPy  STENT PLACEMENT;  Surgeon: Robley Fries, MD;  Location: [facility]S;  Service: Urology;  Laterality: Left;  . CYSTOSCOPY WITH RETROGRADE PYELOGRAM, URETEROSCOPY AND STENT PLACEMENT Left 03/06/2019   Procedure: CYSTOSCOPY WITH RETROGRADE PYELOGRAM, URETEROSCOPY AND STENT PLACEMENT;  Surgeon: Robley Fries, MD;  Location: [facility];  Service: Urology;  Laterality: Left;  90 MINS  . CYSTOSCOPY WITH STENT PLACEMENT Left 03/07/2019   Procedure: CYSTOSCOPY WITH , URETEROSCOPY/ STENT PLACEMENT;  Surgeon: Ardis Hughs, MD;  Location: [facility];  Service: Urology;  Laterality: Left;  . DILATION AND CURETTAGE OF UTERUS     S/P miscarriage  . ESOPHAGOGASTRODUODENOSCOPY N/A 11/17/2017   Procedure: ESOPHAGOGASTRODUODENOSCOPY (EGD);  Surgeon: Ronnette Juniper, MD;  Location: [facility];  Service: Gastroenterology;  Laterality: N/A;  . HOLMIUM LASER APPLICATION Left 08/09/7791   Procedure: HOLMIUM LASER APPLICATION;  Surgeon: Robley Fries, MD;  Location: [facility];  Service: Urology;  Laterality: Left;  . NASAL SEPTUM SURGERY    . POLYPECTOMY  11/17/2017   Procedure: POLYPECTOMY;  Surgeon: Ronnette Juniper, MD;  Location: [facility];  Service: Gastroenterology;;  . TONSILLECTOMY      Social History   Socioeconomic History  . Marital status: Divorced    Spouse name: Not on file  . Number of children: Not on  file  . Years of education: Not on file  . Highest education level: Not on file  Occupational History  . Not on file  Tobacco Use  . Smoking status: Never Smoker  . Smokeless tobacco: Never Used  Substance and Sexual Activity  . Alcohol use: Not Currently  . Drug use: No  . Sexual activity: Never  Other Topics Concern  . Not on file  Social History Narrative  . Not on file   Social Determinants of Health   Financial Resource Strain:   . Difficulty of Paying Living Expenses: Not on file  Food Insecurity:   . Worried About Charity fundraiser in the Last Year: Not on file  . Ran Out of Food in the Last Year: Not on file  Transportation Needs:   . Lack of Transportation (Medical): Not on file  . Lack of Transportation (Non-Medical): Not on file  Physical Activity:   . Days of Exercise per Week: Not on file  . Minutes of Exercise per Session: Not on file  Stress:   . Feeling of Stress : Not on file  Social Connections:   . Frequency of Communication with Friends and Family: Not on file  . Frequency of Social Gatherings with Friends and Family: Not on file  . Attends Religious Services: Not on file  . Active Member of Clubs or Organizations: Not on file  . Attends Archivist Meetings: Not on file  . Marital Status: Not on file  Intimate Partner Violence:   . Fear of Current or Ex-Partner: Not on file  . Emotionally Abused: Not on file  . Physically Abused: Not on file  . Sexually Abused: Not on file    Family History  Problem Relation Age of Onset  . Cancer Father   . Heart failure Father   . Diabetes Father   . CAD Mother   . Diabetes Brother   . Hypertension Brother   . Breast cancer Neg Hx     Current Outpatient Medications on File Prior to Visit  Medication Sig Dispense Refill  . acetaminophen (TYLENOL) 650 MG CR tablet Take 650 mg by mouth 2 (two) times daily as needed for pain.    Marland Kitchen alendronate (FOSAMAX) 70 MG tablet Take 70 mg by mouth every  Sunday. Take with a full glass of water on an empty stomach.     Marland Kitchen aspirin EC 81 MG EC tablet Take 1 tablet (81 mg total) by mouth daily.    . Biotin w/ Vitamins C & E (HAIR SKIN & NAILS GUMMIES PO) Take 2 tablets by mouth daily.     . blood glucose meter kit and supplies Dispense based on patient and insurance preference. Use up to four times daily as directed. (FOR ICD-10 E10.9, E11.9). 1 each 0  . Calcium-Vitamin D-Vitamin K (CALCIUM + D + K PO) Take 1 tablet by mouth daily.    . carvedilol (COREG) 3.125 MG tablet Take 1 tablet (3.125 mg total) by mouth 2 (two) times daily with a meal. 180 tablet 3  . diphenhydrAMINE-zinc  acetate (BENADRYL) cream Apply 1 application topically 3 (three) times daily as needed for itching.    . folic acid (FOLVITE) 1 MG tablet Take 1 tablet (1 mg total) by mouth daily. (Patient taking differently: Take 1 mg by mouth daily. 0.8 mg otc)    . folic acid (FOLVITE) 595 MCG tablet Take 800 mcg by mouth daily.    . furosemide (LASIX) 20 MG tablet Take 1 tablet (20 mg) Daily and then 20 mg PRN Daily for swelling. (Patient taking differently: Take 20 mg by mouth daily. ) 90 tablet 3  . HYDROcodone-acetaminophen (NORCO) 5-325 MG tablet Take 1 tablet by mouth every 4 (four) hours as needed for moderate pain (kidney stone pain). 20 tablet 0  . insulin aspart (NOVOLOG) 100 UNIT/ML FlexPen Inject 12 Units into the skin 3 (three) times daily with meals. 15 mL 1  . Insulin Glargine (BASAGLAR KWIKPEN [place]) Inject 34 Units into the skin daily.    . Insulin Pen Needle 31G X 5 MM MISC 12 Units by Does not apply route 3 (three) times daily. 100 each 0  . Ipratropium-Albuterol (COMBIVENT RESPIMAT) 20-100 MCG/ACT AERS respimat Inhale 1 puff into the lungs every 6 (six) hours as needed for wheezing or shortness of breath. Use 3 times daily x 5 days, then every 6 hours as needed. 4 g 1  . Misc Natural Products (GLUCOSAMINE CHONDROITIN TRIPLE) TABS Take 2 tablets by mouth daily.     . Multiple  Vitamins-Minerals (CENTRUM SILVER) CHEW Chew 2 tablets by mouth daily. soft chews    . oxybutynin (DITROPAN) 5 MG tablet Take 5 mg by mouth at bedtime.    . pantoprazole (PROTONIX) 40 MG tablet Take 40 mg by mouth daily.    . phenazopyridine (PYRIDIUM) 100 MG tablet Take 1 tablet (100 mg total) by mouth 3 (three) times daily as needed for pain. 12 tablet 0  . potassium chloride (K-DUR,KLOR-CON) 10 MEQ tablet TAKE 2 TABLETS BY MOUTH TWICE DAILY (Patient taking differently: Take 20 mEq by mouth 2 (two) times daily. ) 360 tablet 3  . psyllium (METAMUCIL SMOOTH TEXTURE) 28 % packet Take 1 packet by mouth 2 (two) times daily.     . rivaroxaban (XARELTO) 20 MG TABS tablet Take 1 tablet (20 mg total) by mouth every evening. 90 tablet 1  . simvastatin (ZOCOR) 10 MG tablet Take 1 tablet (10 mg total) by mouth at bedtime. 90 tablet 3  . sotalol (BETAPACE) 80 MG tablet TAKE 1 TABLET(80 MG) BY MOUTH EVERY 12 HOURS (Patient taking differently: Take 80 mg by mouth 2 (two) times daily. ) 245 tablet 3  . spironolactone (ALDACTONE) 25 MG tablet TAKE 1/2 TABLET (12.5MG    TOTAL) DAILY (Patient taking differently: Take 12.5 mg by mouth daily. ) 45 tablet 3  . tamsulosin (FLOMAX) 0.4 MG CAPS capsule Take 1 capsule (0.4 mg total) by mouth daily for 21 days. 21 capsule 0  . triamcinolone cream (KENALOG) 0.1 % Apply 1 application topically 2 (two) times daily as needed (itching legs).      No current facility-administered medications on file prior to visit.    Allergies as of 03/14/2019 - Review Complete 03/14/2019  Allergen Reaction Noted  . No healthtouch food allergies  09/23/2016     ROS:   General:  No weight loss, Fever, chills  HEENT: No recent headaches, no nasal bleeding, no visual changes, no sore throat  Neurologic: No dizziness, blackouts, seizures. No recent symptoms of stroke or mini- stroke. No  recent episodes of slurred speech, or temporary blindness.  Cardiac: No recent episodes of chest  pain/pressure, no shortness of breath at rest.  No shortness of breath with exertion.  Denies history of atrial fibrillation or irregular heartbeat  Vascular: No history of rest pain in feet.  No history of claudication.  No history of non-healing ulcer, No history of DVT   Pulmonary: No home oxygen, no productive cough, no hemoptysis,  No asthma or wheezing  Musculoskeletal:  _0  Arthritis, _1  Low back pain,  [x ] Joint pain  Hematologic:No history of hypercoagulable state.  No history of easy bleeding.  No history of anemia  Gastrointestinal: No hematochezia or melena,  No gastroesophageal reflux, no trouble swallowing  Urinary: _2  chronic Kidney disease, _3  on HD - _4  MWF or _5  TTHS, _6  Burning with urination, _7  Frequent urination, _8  Difficulty urinating;   Skin: No rashes  Psychological: No history of anxiety,  No history of depression  Physical Examination  Vitals:   03/14/19 1426  BP: (!) 147/93  Pulse: 71  Resp: 16  Temp: (!) 97.2 F (36.2 C)  TempSrc: Temporal  SpO2: 96%  Weight: 276 lb (125.2 kg)  Height: _9  (1.575 m)    Body mass index is 50.48 kg/m.  General:  Alert and oriented, no acute distress HEENT: Normal Neck: No bruit or JVD Pulmonary: Clear to auscultation bilaterally Cardiac: irregularly irregular  without murmur Abdomen: Soft, non-tender, non-distended, no mass, no scars Skin: No rash Extremity Pulses:  2+ radial, brachial pulses B,  brisk doppler signals dorsalis pedis, posterior tibial bilaterally Musculoskeletal: No deformity, positive mild edema B LE  Neurologic: Upper and lower extremity motor grossly intact and symmetrical  DATA:   Venous Reflux Times  +--------------+---------+------+-----------+------------+--------+  RIGHT     Reflux NoRefluxReflux TimeDiameter cmsComments               Yes                   +--------------+---------+------+-----------+------------+--------+   CFV      no                         +--------------+---------+------+-----------+------------+--------+  FV mid    no                         +--------------+---------+------+-----------+------------+--------+  Popliteal   no                         +--------------+---------+------+-----------+------------+--------+  GSV at SFJ  no                         +--------------+---------+------+-----------+------------+--------+  GSV prox thighno                         +--------------+---------+------+-----------+------------+--------+  GSV mid thigh no                         +--------------+---------+------+-----------+------------+--------+  GSV dist thighno                         +--------------+---------+------+-----------+------------+--------+  GSV at knee  no                         +--------------+---------+------+-----------+------------+--------+  GSV prox calf no                         +--------------+---------+------+-----------+------------+--------+  SSV Pop Fossa no                         +--------------+---------+------+-----------+------------+--------+  SSV prox calf no                         +--------------+---------+------+-----------+------------+--------+  SSV mid calf no                         +--------------+---------+------+-----------+------------+--------+     +--------------+---------+------+-----------+------------+--------+  LEFT     Reflux NoRefluxReflux TimeDiameter cmsComments               Yes                    +--------------+---------+------+-----------+------------+--------+  CFV      no                         +--------------+---------+------+-----------+------------+--------+  FV mid    no                         +--------------+---------+------+-----------+------------+--------+  Popliteal   no                         +--------------+---------+------+-----------+------------+--------+  GSV at SFJ  no                         +--------------+---------+------+-----------+------------+--------+  GSV prox thighno                         +--------------+---------+------+-----------+------------+--------+  GSV mid thigh no                         +--------------+---------+------+-----------+------------+--------+  GSV dist thighno                         +--------------+---------+------+-----------+------------+--------+  GSV at knee  no                         +--------------+---------+------+-----------+------------+--------+  GSV prox calf no                         +--------------+---------+------+-----------+------------+--------+  SSV Pop Fossa no                         +--------------+---------+------+-----------+------------+--------+  SSV prox calf no                         +--------------+---------+------+-----------+------------+--------+  SSV mid calf no                         +--------------+---------+------+-----------+------------+--------+         Summary:  Bilateral:  - No evidence of deep vein thrombosis seen in the lower extremities,  bilaterally, from the common femoral through the popliteal veins.  - No evidence of  superficial venous thrombosis in the lower extremities,  bilaterally.  - No evidence of deep venous insufficiency seen bilaterally in the lower  extremity.  -  No evidence of superficial venous reflux seen in the greater saphenous  veins bilaterally.  - No evidence of superficial venous reflux seen in the short saphenous  veins bilaterally.     Assessment: No venous or arterial deficits B LE. Skin rash of unknown cause left posterior lower leg. Venous duplex negative for reflux. Doppler brisk arterial flow B LE.  Plan: Creat exercise program for gradual weight loss in safe environment. Increase walking towards walking program. F/U as needed in the future.    Addendum: Irregular heart beat heard today in office with doppler and stethoscope.  Known history of Afib.  On Xarelto.    Roxy Horseman PA-C [facility] [place] Office: [phone]  MD on Call Early

## 2019-03-14 NOTE — Telephone Encounter (Signed)
Please have her come in for EKG tomorrow

## 2019-03-14 NOTE — Telephone Encounter (Signed)
Patient called to let us know that when she was seeing Laurence Slate, PA-C [facility] she noted an irregular rhythm. Patient states that she just got out of the hospital from having a ureteral stent removed. Patient was noted to be in NSR during hospital stay. She has a history of A fib and is on Xarelto. She states that she is not having any palpitations, dizziness, weakness, or shortness of breath. Advised patient that I would make Dr. Radford Pax aware.

## 2019-03-15 ENCOUNTER — Ambulatory Visit (INDEPENDENT_AMBULATORY_CARE_PROVIDER_SITE_OTHER): Payer: Medicare Other

## 2019-03-15 VITALS — Ht 62.0 in | Wt 276.0 lb

## 2019-03-15 DIAGNOSIS — I4819 Other persistent atrial fibrillation: Secondary | ICD-10-CM

## 2019-03-15 NOTE — Telephone Encounter (Signed)
Spoke with patient and she will come in today for an EKG

## 2019-03-15 NOTE — Progress Notes (Signed)
1.) Reason for visit: EKG for irregular heart rate  2.) Name of MD requesting visit: Dr. Radford Pax  3.) Assessment and plan per MD: EKG reviewed by Dr. Radford Pax. Showed NSR with PAC's. No changes.

## 2019-03-15 NOTE — Patient Instructions (Signed)
Medication Instructions:  Your physician recommends that you continue on your current medications as directed. Please refer to the Current Medication list given to you today.  *If you need a refill on your cardiac medications before your next appointment, please call your pharmacy*  Follow-Up: At [facility], you and your health needs are our priority.  As part of our continuing mission to provide you with exceptional heart care, we have created designated Provider Care Teams.  These Care Teams include your primary Cardiologist (physician) and Advanced Practice Providers (APPs -  Physician Assistants and Nurse Practitioners) who all work together to provide you with the care you need, when you need it.    

## 2019-03-16 NOTE — Addendum Note (Signed)
Addended by: Antonieta Iba on: 03/16/2019 02:04 PM   Modules accepted: Orders

## 2019-03-17 ENCOUNTER — Ambulatory Visit: Payer: Medicare Other

## 2019-03-17 ENCOUNTER — Ambulatory Visit: Payer: Medicare Other | Attending: Internal Medicine

## 2019-03-17 DIAGNOSIS — Z23 Encounter for immunization: Secondary | ICD-10-CM | POA: Insufficient documentation

## 2019-03-17 NOTE — Progress Notes (Signed)
   Covid-19 Vaccination Clinic  Name:  MANIYA CAUTHEN    MRN: [id number] DOB: [birth year]  03/17/2019  Ms. Colavita was observed post Covid-19 immunization for 15 minutes without incidence. She was provided with Vaccine Information Sheet and instruction to access the V-Safe system.   Ms. Donnelly was instructed to call 911 with any severe reactions post vaccine: Marland Kitchen Difficulty breathing  . Swelling of your face and throat  . A fast heartbeat  . A bad rash all over your body  . Dizziness and weakness    Immunizations Administered    Name Date Dose VIS Date Route   Pfizer COVID-19 Vaccine 03/17/2019 11:33 AM 0.3 mL 01/19/2019 Intramuscular   Manufacturer: [place]   Lot: [id number]   [place]: [id number]

## 2019-03-22 ENCOUNTER — Other Ambulatory Visit: Payer: Medicare Other

## 2019-03-26 ENCOUNTER — Ambulatory Visit: Payer: Medicare Other

## 2019-03-30 ENCOUNTER — Telehealth: Payer: Self-pay | Admitting: Cardiology

## 2019-03-30 MED ORDER — PANTOPRAZOLE SODIUM 40 MG PO TBEC: 40.0000 mg | DELAYED_RELEASE_TABLET | Freq: Every day | ORAL | 1 refills | Status: DC

## 2019-03-30 MED ORDER — FUROSEMIDE 20 MG PO TABS: ORAL_TABLET | ORAL | 1 refills | Status: DC

## 2019-03-30 MED ORDER — CARVEDILOL 3.125 MG PO TABS: 3.1250 mg | ORAL_TABLET | Freq: Two times a day (BID) | ORAL | 1 refills | Status: DC

## 2019-03-30 MED ORDER — SIMVASTATIN 10 MG PO TABS: 10.0000 mg | ORAL_TABLET | Freq: Every day | ORAL | 1 refills | Status: AC

## 2019-03-30 MED ORDER — SOTALOL HCL 80 MG PO TABS: ORAL_TABLET | ORAL | 1 refills | Status: DC

## 2019-03-30 MED ORDER — POTASSIUM CHLORIDE CRYS ER 10 MEQ PO TBCR: EXTENDED_RELEASE_TABLET | ORAL | 1 refills | Status: DC

## 2019-03-30 NOTE — Telephone Encounter (Signed)
New message   Pt c/o medication issue:  1. Name of Medication: patient states that all prescriptions that Dr. Radford Pax prescribed need new prescriptions  2. How are you currently taking this medication (dosage and times per day)? As written  3. Are you having a reaction (difficulty breathing--STAT)?no   4. What is your medication issue? Patient needs new prescription for all medications that Dr. Radford Pax prescribed sent to [facility]

## 2019-03-30 NOTE — Telephone Encounter (Signed)
Pt's medications were sent to pt's pharmacy as requested. Confirmation received.  

## 2019-04-01 ENCOUNTER — Ambulatory Visit: Payer: Medicare Other

## 2019-04-06 ENCOUNTER — Telehealth: Payer: Self-pay | Admitting: *Deleted

## 2019-04-08 DIAGNOSIS — M7070 Other bursitis of hip, unspecified hip: Secondary | ICD-10-CM | POA: Diagnosis not present

## 2019-04-08 DIAGNOSIS — J45909 Unspecified asthma, uncomplicated: Secondary | ICD-10-CM | POA: Diagnosis not present

## 2019-04-08 DIAGNOSIS — I420 Dilated cardiomyopathy: Secondary | ICD-10-CM | POA: Diagnosis not present

## 2019-04-08 DIAGNOSIS — Z6841 Body Mass Index (BMI) 40.0 and over, adult: Secondary | ICD-10-CM | POA: Diagnosis not present

## 2019-04-08 DIAGNOSIS — T83123D Displacement of other urinary stents, subsequent encounter: Secondary | ICD-10-CM | POA: Diagnosis not present

## 2019-04-08 DIAGNOSIS — F329 Major depressive disorder, single episode, unspecified: Secondary | ICD-10-CM | POA: Diagnosis not present

## 2019-04-08 DIAGNOSIS — I5032 Chronic diastolic (congestive) heart failure: Secondary | ICD-10-CM | POA: Diagnosis not present

## 2019-04-08 DIAGNOSIS — N1832 Chronic kidney disease, stage 3b: Secondary | ICD-10-CM | POA: Diagnosis not present

## 2019-04-08 DIAGNOSIS — I130 Hypertensive heart and chronic kidney disease with heart failure and stage 1 through stage 4 chronic kidney disease, or unspecified chronic kidney disease: Secondary | ICD-10-CM | POA: Diagnosis not present

## 2019-04-08 DIAGNOSIS — K219 Gastro-esophageal reflux disease without esophagitis: Secondary | ICD-10-CM | POA: Diagnosis not present

## 2019-04-08 DIAGNOSIS — G4733 Obstructive sleep apnea (adult) (pediatric): Secondary | ICD-10-CM | POA: Diagnosis not present

## 2019-04-08 DIAGNOSIS — Z7901 Long term (current) use of anticoagulants: Secondary | ICD-10-CM | POA: Diagnosis not present

## 2019-04-08 DIAGNOSIS — E7800 Pure hypercholesterolemia, unspecified: Secondary | ICD-10-CM | POA: Diagnosis not present

## 2019-04-08 DIAGNOSIS — Z7982 Long term (current) use of aspirin: Secondary | ICD-10-CM | POA: Diagnosis not present

## 2019-04-08 DIAGNOSIS — Z794 Long term (current) use of insulin: Secondary | ICD-10-CM | POA: Diagnosis not present

## 2019-04-08 DIAGNOSIS — E1122 Type 2 diabetes mellitus with diabetic chronic kidney disease: Secondary | ICD-10-CM | POA: Diagnosis not present

## 2019-04-08 DIAGNOSIS — R1310 Dysphagia, unspecified: Secondary | ICD-10-CM | POA: Diagnosis not present

## 2019-04-08 DIAGNOSIS — G4710 Hypersomnia, unspecified: Secondary | ICD-10-CM | POA: Diagnosis not present

## 2019-04-08 DIAGNOSIS — K5730 Diverticulosis of large intestine without perforation or abscess without bleeding: Secondary | ICD-10-CM | POA: Diagnosis not present

## 2019-04-08 DIAGNOSIS — M1611 Unilateral primary osteoarthritis, right hip: Secondary | ICD-10-CM | POA: Diagnosis not present

## 2019-04-08 DIAGNOSIS — M170 Bilateral primary osteoarthritis of knee: Secondary | ICD-10-CM | POA: Diagnosis not present

## 2019-04-08 DIAGNOSIS — I4819 Other persistent atrial fibrillation: Secondary | ICD-10-CM | POA: Diagnosis not present

## 2019-04-08 DIAGNOSIS — E559 Vitamin D deficiency, unspecified: Secondary | ICD-10-CM | POA: Diagnosis not present

## 2019-04-08 DIAGNOSIS — N23 Unspecified renal colic: Secondary | ICD-10-CM | POA: Diagnosis not present

## 2019-04-08 NOTE — Progress Notes (Signed)
Virtual Visit via Video Note   This visit type was conducted due to national recommendations for restrictions regarding the COVID-19 Pandemic (e.g. social distancing) in an effort to limit this patient's exposure and mitigate transmission in our community.  Due to her co-morbid illnesses, this patient is at least at moderate risk for complications without adequate follow up.  This format is felt to be most appropriate for this patient at this time.  All issues noted in this document were discussed and addressed.  A limited physical exam was performed with this format.  Please refer to the patient's chart for her consent to telehealth for [facility].  Evaluation Performed:  Follow-up visit  This visit type was conducted due to national recommendations for restrictions regarding the COVID-19 Pandemic (e.g. social distancing).  This format is felt to be most appropriate for this patient at this time.  All issues noted in this document were discussed and addressed.  No physical exam was performed (except for noted visual exam findings with Video Visits).  Please refer to the patient's chart (MyChart message for video visits and phone note for telephone visits) for the patient's consent to telehealth for [facility].  Date:  04/09/2019   ID:  Karen Rasmussen, DOB 1945-02-14, MRN [id number]  Patient Location:  Home  Provider location:   [place]  PCP:  Kathyrn Lass, MD  Cardiologist:  Fransico Him, MD  Electrophysiologist:  None   Chief Complaint:    History of Present Illness:    Karen Rasmussen is a 74 y.o. female who presents via audio/video conferencing for a telehealth visit today.    Karen Rasmussen is a 74 y.o. female with a history of normal coronary arteries with calcium score of 0 on coronary CTA in 08/960, chronic diastolic CHF/non-ischemic cardiomyopathy with EF of 45-50%, paroxysmal atrial fibrillation s/p DCCV x2 and now on Sotalol and Xarelto, obstructive sleep apnea on CPAP,  hypertension, diabetes mellitus, and morbid obesity.  Patient has been followed by Dr. Radford Pax for several years for obstructive sleep apnea and chronic diastolic CHF with chronic dyspnea. At office visit in 04/2016, she was noted to be in new onset atrial fibrillation. CHA2DS2-VASc = 5 so patient was started on Xarelto. Echo was ordered and showed LVEF of 45-50% with diffuse hypokinesis. Myoview was ordered for evaluation of ischemia. Study was low with fixed defect in basal septum and inferior walls. Felt to be attenuation vs artifact but coronary CT was ordered for confirmation. Coronary CT showed calcium score of 0 with normal coronary arteries. Patient underwent successful DCCV in 06/2016 after 4 weeks of uninterrupted anticoagulation. However, patient developed allergic reaction from pads and required a course of Prednisone. At follow-up visit in 07/2016, she was noted to be back in atrial fibrillation and was referred to EP. Patient underwent repeat DCCV which was initially successful was ultimately went back in atrial fibrillation. She was admitted in 09/2016 for initiation of Sotalol.   Patient was last seen by Dr. Radford Pax in 03/2018 at which time she reported continued chronic stable dyspnea on exertion as well as occasional lower extremity edema but was otherwise doing well from a cardiac standpoint. She was maintaining sinus rhythm at that time.   Patient presents today for follow-up. Patient doing well today from a cardiac standpoint. Continues to have chronic dyspnea on exertion but she reports that this is stable and no worse than normal. She occasional has very brief episodes of substernal sharp chest pain that only usually  last a minute and then resolves independently. This sometimes occurs with eating. Denies any exertional chest pain. Patient thinks this is do to her hiatal hernia and is not worried about it. I agree that it sounds non-cardiac. She continues to have some lower extremity edema.  She states her left leg is always a little bit larger than the right. About 2 weeks ago, she was visiting family in [place] and noticed that her left leg was more swollen than normal. She also noticed an area behind her lower left calf that was red and slightly raised but not painful or warm to the touch. This area is still there but has improved. Patient states swelling has since improved and is now back to her baseline. She reports occasional lightheadedness/dizziness with standing to quickly so she is very careful. No syncope or falls. She bruises easily on the Xarelto but denies any abnormal bleeding.   The patient does not have symptoms concerning for COVID-19 infection (fever, chills, cough, or new shortness of breath).    Prior CV studies:   The following studies were reviewed today:  2D echo, nuclear stress test, outside labs from PCP on KPN  Past Medical History:  Diagnosis Date  . Abdominal pain, left lower quadrant   . Asthmatic bronchitis   . Benign essential HTN 04/26/2016  . Bursitis of hip   . Chronic diastolic CHF (congestive heart failure) ([place])   . DCM (dilated cardiomyopathy) ([place])    EF 45-50%  . GERD (gastroesophageal reflux disease)   . High cholesterol   . History of kidney stones   . Hypersomnia   . Morbid obesity with BMI of 50.0-59.9, adult ([place])   . OSA on CPAP   . Osteoarthritis    "right hip; both knees" (09/21/2016)  . Persistent atrial fibrillation ([place]) 04/26/2016  . Type II diabetes mellitus ([place])   . Vitamin D deficiency disease    Past Surgical History:  Procedure Laterality Date  . ABDOMINAL HYSTERECTOMY    . APPENDECTOMY    . BALLOON DILATION N/A 11/17/2017   Procedure: BALLOON DILATION;  Surgeon: Ronnette Juniper, MD;  Location: [facility];  Service: Gastroenterology;  Laterality: N/A;  . BIOPSY  11/17/2017   Procedure: BIOPSY;  Surgeon: Ronnette Juniper, MD;  Location: [facility] ENDOSCOPY;  Service: Gastroenterology;;  . CARDIAC CATHETERIZATION    .  CARDIOVERSION N/A 06/11/2016   Procedure: CARDIOVERSION;  Surgeon: Dorothy Spark, MD;  Location: [facility] ENDOSCOPY;  Service: Cardiovascular;  Laterality: N/A;  . CARDIOVERSION N/A 07/16/2016   Procedure: CARDIOVERSION;  Surgeon: Thayer Headings, MD;  Location: [facility] ENDOSCOPY;  Service: Cardiovascular;  Laterality: N/A;  . CARDIOVERSION N/A 09/23/2016   Procedure: CARDIOVERSION;  Surgeon: Sanda Klein, MD;  Location: [facility] ENDOSCOPY;  Service: Cardiovascular;  Laterality: N/A;  . CHOLECYSTECTOMY OPEN    . COLONOSCOPY N/A 11/17/2017   Procedure: COLONOSCOPY;  Surgeon: Ronnette Juniper, MD;  Location: [facility] ENDOSCOPY;  Service: Gastroenterology;  Laterality: N/A;  . cortisone injection to right knee    . costisone injection to left knee  11/03/2017  . CYSTOSCOPY WITH RETROGRADE PYELOGRAM, URETEROSCOPY AND STENT PLACEMENT Left 01/14/2019   Procedure: CYSTOSCOPy  STENT PLACEMENT;  Surgeon: Robley Fries, MD;  Location: [facility] ORS;  Service: Urology;  Laterality: Left;  . CYSTOSCOPY WITH RETROGRADE PYELOGRAM, URETEROSCOPY AND STENT PLACEMENT Left 03/06/2019   Procedure: CYSTOSCOPY WITH RETROGRADE PYELOGRAM, URETEROSCOPY AND STENT PLACEMENT;  Surgeon: Robley Fries, MD;  Location: [facility]S;  Service: Urology;  Laterality: Left;  90 MINS  .  CYSTOSCOPY WITH STENT PLACEMENT Left 03/07/2019   Procedure: CYSTOSCOPY WITH , URETEROSCOPY/ STENT PLACEMENT;  Surgeon: Ardis Hughs, MD;  Location: [facility];  Service: Urology;  Laterality: Left;  . DILATION AND CURETTAGE OF UTERUS     S/P miscarriage  . ESOPHAGOGASTRODUODENOSCOPY N/A 11/17/2017   Procedure: ESOPHAGOGASTRODUODENOSCOPY (EGD);  Surgeon: Ronnette Juniper, MD;  Location: [facility];  Service: Gastroenterology;  Laterality: N/A;  . HOLMIUM LASER APPLICATION Left 3/41/9622   Procedure: HOLMIUM LASER APPLICATION;  Surgeon: Robley Fries, MD;  Location: [facility];  Service: Urology;  Laterality: Left;  . NASAL SEPTUM SURGERY    . POLYPECTOMY  11/17/2017    Procedure: POLYPECTOMY;  Surgeon: Ronnette Juniper, MD;  Location: [facility] ENDOSCOPY;  Service: Gastroenterology;;  . TONSILLECTOMY       Current Meds  Medication Sig  . acetaminophen (TYLENOL) 650 MG CR tablet Take 650 mg by mouth 2 (two) times daily as needed for pain.  Marland Kitchen alendronate (FOSAMAX) 70 MG tablet Take 70 mg by mouth every Sunday. Take with a full glass of water on an empty stomach.   Marland Kitchen aspirin EC 81 MG EC tablet Take 1 tablet (81 mg total) by mouth daily.  . Biotin w/ Vitamins C & E (HAIR SKIN & NAILS GUMMIES PO) Take 2 tablets by mouth daily.   . blood glucose meter kit and supplies Dispense based on patient and insurance preference. Use up to four times daily as directed. (FOR ICD-10 E10.9, E11.9).  . Calcium-Vitamin D-Vitamin K (CALCIUM + D + K PO) Take 1 tablet by mouth daily.  . carvedilol (COREG) 3.125 MG tablet Take 1 tablet (3.125 mg total) by mouth 2 (two) times daily with a meal.  . diphenhydrAMINE-zinc acetate (BENADRYL) cream Apply 1 application topically 3 (three) times daily as needed for itching.  . folic acid (FOLVITE) 297 MCG tablet Take 800 mcg by mouth daily.  . Folic Acid 0.8 MG CAPS Take by mouth daily.  . furosemide (LASIX) 20 MG tablet Take 1 tablet (20 mg) Daily and then 20 mg PRN Daily for swelling.  . gabapentin (NEURONTIN) 300 MG capsule Take 300 mg by mouth at bedtime.  Marland Kitchen HYDROcodone-acetaminophen (NORCO) 5-325 MG tablet Take 1 tablet by mouth every 4 (four) hours as needed for moderate pain (kidney stone pain).  . insulin aspart (NOVOLOG) 100 UNIT/ML FlexPen Inject 12 Units into the skin 3 (three) times daily with meals.  . Insulin Glargine (BASAGLAR KWIKPEN [place]) Inject 34 Units into the skin daily.  . Insulin Pen Needle 31G X 5 MM MISC 12 Units by Does not apply route 3 (three) times daily.  . Ipratropium-Albuterol (COMBIVENT RESPIMAT) 20-100 MCG/ACT AERS respimat Inhale 1 puff into the lungs every 6 (six) hours as needed for wheezing or shortness of breath. Use 3  times daily x 5 days, then every 6 hours as needed.  . Misc Natural Products (GLUCOSAMINE CHONDROITIN TRIPLE) TABS Take 2 tablets by mouth daily.   . Multiple Vitamins-Minerals (CENTRUM SILVER) CHEW Chew 2 tablets by mouth daily. soft chews  . oxybutynin (DITROPAN) 5 MG tablet Take 5 mg by mouth at bedtime.  . pantoprazole (PROTONIX) 40 MG tablet Take 1 tablet (40 mg total) by mouth daily.  . phenazopyridine (PYRIDIUM) 100 MG tablet Take 1 tablet (100 mg total) by mouth 3 (three) times daily as needed for pain.  . potassium chloride (KLOR-CON) 10 MEQ tablet TAKE 2 TABLETS BY MOUTH TWICE DAILY  . psyllium (METAMUCIL SMOOTH TEXTURE) 28 % packet Take 1  packet by mouth 2 (two) times daily.   . rivaroxaban (XARELTO) 20 MG TABS tablet Take 1 tablet (20 mg total) by mouth every evening.  . simvastatin (ZOCOR) 10 MG tablet Take 1 tablet (10 mg total) by mouth at bedtime.  . sotalol (BETAPACE) 80 MG tablet TAKE 1 TABLET(80 MG) BY MOUTH EVERY 12 HOURS  . spironolactone (ALDACTONE) 25 MG tablet TAKE 1/2 TABLET (12.5MG    TOTAL) DAILY  . triamcinolone cream (KENALOG) 0.1 % Apply 1 application topically 2 (two) times daily as needed (itching legs).      Allergies:   No healthtouch food allergies   Social History   Tobacco Use  . Smoking status: Never Smoker  . Smokeless tobacco: Never Used  Substance Use Topics  . Alcohol use: Not Currently  . Drug use: No     Family Hx: The patient's family history includes CAD in her mother; Cancer in her father; Diabetes in her brother and father; Heart failure in her father; Hypertension in her brother. There is no history of Breast cancer.  ROS:   Please see the history of present illness.     All other systems reviewed and are negative.   Labs/Other Tests and Data Reviewed:    Recent Labs: 01/13/2019: B Natriuretic Peptide 242.1; TSH 2.426 01/17/2019: ALT 26 01/20/2019: Magnesium 2.0 03/02/2019: BUN 16; Creatinine, Ser 0.95; Potassium 4.4; Sodium  140 03/06/2019: Hemoglobin 12.2; Platelets 214   Recent Lipid Panel No results found for: CHOL, TRIG, HDL, CHOLHDL, LDLCALC, LDLDIRECT  Wt Readings from Last 3 Encounters:  04/09/19 274 lb (124.3 kg)  03/15/19 276 lb (125.2 kg)  03/14/19 276 lb (125.2 kg)     Objective:    Vital Signs:  Ht _0  (1.575 m)   Wt 274 lb (124.3 kg)   BMI 50.12 kg/m    CONSTITUTIONAL:  Well nourished, well developed female in no acute distress.  EYES: anicteric MOUTH: oral mucosa is pink RESPIRATORY: Normal respiratory effort, symmetric expansion CARDIOVASCULAR: No peripheral edema SKIN: No rash, lesions or ulcers MUSCULOSKELETAL: no digital cyanosis NEURO: Cranial Nerves II-XII grossly intact, moves all extremities PSYCH: Intact judgement and insight.  A&O x 3, Mood/affect appropriate   ASSESSMENT & PLAN:    1.  Paroxysmal Atrial Fibrillation -she denies any palpitations and thinks she is in NSR -Continue Sotalol 31m twice daily and Coreg 3.124mtwice daily.  -Continue Xarelto 2036maily.  -denies any bleeding problems on DOAC. -Hbg 12.2, K+  4.4  in Jan 2020 and Mag 2.0 in Dec 2020  2.  Chronic Combined CHF/Non-Ischemic Cardiomyopathy -Most recent Echo from 03/2019 showed LVEF of 55-60% with moderate LVH and G2DD -she gets winded some with exertion due to morbid obesity and sedentary state - this seem stable -Continue Coreg 3.125m56mice daily, Spironolactone 12.5mg 4mly, Lasix 20mg 66my  3.  Lower Extremity Edema -Patient has chronic lower extremity edema that is stable -Suspect secondary to morbid obesity and more sedentary lifestyle.  -she is following a low Na diet -continue lasix 20mg d43m -outside labs reviewed from PCP on KPN and showed a Creatinine of 0.95 and K+ 4.4 -continue compression hose  4.  Atypical Chest Discomfort -Coronary CT in 09/2016 showed calcium score of 0 and clear coronaries.  -CP has resolved -she is having a Barium swallow per GI  5   Hypertension -BP controlled -continue Carvedilol 3.125mg BI10md spiro 25mg dai78m-Given slightly reduced EF and diabetes, patient would benefit from ACEi/ARB. If potassium and renal  function are okay, will add Lisinopril 67m daily and recheck BMET in 1 week.   6.  OSA -  The patient is tolerating PAP therapy well without any problems. The PAP download was reviewed today and showed an AHI of 1.3/hr on auto PAP with 73% compliance in using more than 4 hours nightly.  The patient has been using and benefiting from PAP use and will continue to benefit from therapy.   7.  DM type 2 -now on Insulin -followed by PCP -she is no longer on Metformin and Glimepiride  8.  Mild aortic stenosis -mild by echo 02/2019 -mean AVG 861mg -repeat echo in 2 years    Diabetes Mellitus Patient on Metformin 50018mwice daily and Glimepiride 8mg5mily. Hemoglobin A1c 7.4 in 12/2017. Per patient's request, will go ahead and recheck today with blood draw but will defer management to PCP.  COVID-19 Education: The signs and symptoms of COVID-19 were discussed with the patient and how to seek care for testing (follow up with PCP or arrange E-visit).  The importance of social distancing was discussed today.  Patient Risk:   After full review of this patient's clinical status, I feel that they are at least moderate risk at this time.  Time:   Today, I have spent 30 minutes on telemedicine discussing medical problems including AS, OSA< HTN, HLD< afib, CHF and reviewing patient's chart including 2D echo, outside labs from PCP on KPN.  Medication Adjustments/Labs and Tests Ordered: Current medicines are reviewed at length with the patient today.  Concerns regarding medicines are outlined above.  Tests Ordered: No orders of the defined types were placed in this encounter.  Medication Changes: No orders of the defined types were placed in this encounter.   Disposition:  Follow up in 6 month(s)  Signed, TracFransico Him  04/09/2019 1:33 PM    [facility]

## 2019-04-09 ENCOUNTER — Other Ambulatory Visit: Payer: Self-pay

## 2019-04-09 ENCOUNTER — Telehealth (INDEPENDENT_AMBULATORY_CARE_PROVIDER_SITE_OTHER): Payer: Medicare Other | Admitting: Cardiology

## 2019-04-09 ENCOUNTER — Encounter: Payer: Self-pay | Admitting: Cardiology

## 2019-04-09 VITALS — Ht 62.0 in | Wt 274.0 lb

## 2019-04-09 DIAGNOSIS — I480 Paroxysmal atrial fibrillation: Secondary | ICD-10-CM | POA: Diagnosis not present

## 2019-04-09 DIAGNOSIS — R0789 Other chest pain: Secondary | ICD-10-CM

## 2019-04-09 DIAGNOSIS — I350 Nonrheumatic aortic (valve) stenosis: Secondary | ICD-10-CM | POA: Diagnosis not present

## 2019-04-09 DIAGNOSIS — I5042 Chronic combined systolic (congestive) and diastolic (congestive) heart failure: Secondary | ICD-10-CM

## 2019-04-09 DIAGNOSIS — G4733 Obstructive sleep apnea (adult) (pediatric): Secondary | ICD-10-CM

## 2019-04-09 DIAGNOSIS — I5032 Chronic diastolic (congestive) heart failure: Secondary | ICD-10-CM

## 2019-04-09 DIAGNOSIS — R600 Localized edema: Secondary | ICD-10-CM | POA: Diagnosis not present

## 2019-04-09 DIAGNOSIS — I428 Other cardiomyopathies: Secondary | ICD-10-CM | POA: Diagnosis not present

## 2019-04-09 DIAGNOSIS — E785 Hyperlipidemia, unspecified: Secondary | ICD-10-CM

## 2019-04-09 DIAGNOSIS — E119 Type 2 diabetes mellitus without complications: Secondary | ICD-10-CM

## 2019-04-09 DIAGNOSIS — I110 Hypertensive heart disease with heart failure: Secondary | ICD-10-CM | POA: Diagnosis not present

## 2019-04-09 DIAGNOSIS — I4819 Other persistent atrial fibrillation: Secondary | ICD-10-CM

## 2019-04-09 DIAGNOSIS — I10 Essential (primary) hypertension: Secondary | ICD-10-CM

## 2019-04-10 DIAGNOSIS — T83123D Displacement of other urinary stents, subsequent encounter: Secondary | ICD-10-CM | POA: Diagnosis not present

## 2019-04-10 DIAGNOSIS — E1122 Type 2 diabetes mellitus with diabetic chronic kidney disease: Secondary | ICD-10-CM | POA: Diagnosis not present

## 2019-04-10 DIAGNOSIS — N23 Unspecified renal colic: Secondary | ICD-10-CM | POA: Diagnosis not present

## 2019-04-10 DIAGNOSIS — N1832 Chronic kidney disease, stage 3b: Secondary | ICD-10-CM | POA: Diagnosis not present

## 2019-04-10 DIAGNOSIS — I130 Hypertensive heart and chronic kidney disease with heart failure and stage 1 through stage 4 chronic kidney disease, or unspecified chronic kidney disease: Secondary | ICD-10-CM | POA: Diagnosis not present

## 2019-04-10 DIAGNOSIS — I5032 Chronic diastolic (congestive) heart failure: Secondary | ICD-10-CM | POA: Diagnosis not present

## 2019-04-11 ENCOUNTER — Ambulatory Visit: Payer: Medicare Other | Attending: Internal Medicine

## 2019-04-11 ENCOUNTER — Encounter (HOSPITAL_COMMUNITY): Payer: Medicare Other

## 2019-04-11 DIAGNOSIS — Z23 Encounter for immunization: Secondary | ICD-10-CM | POA: Insufficient documentation

## 2019-04-11 NOTE — Progress Notes (Signed)
   Covid-19 Vaccination Clinic  Name:  Karen Rasmussen    MRN: [id number] DOB: 1945-07-19  04/11/2019  [name] was observed post Covid-19 immunization for 15 minutes without incident. She was provided with Vaccine Information Sheet and instruction to access the V-Safe system.   [name] was instructed to call 911 with any severe reactions post vaccine: Marland Kitchen Difficulty breathing  . Swelling of face and throat  . A fast heartbeat  . A bad rash all over body  . Dizziness and weakness   Immunizations Administered    Name Date Dose VIS Date Route   Pfizer COVID-19 Vaccine 04/11/2019  8:37 AM 0.3 mL 01/19/2019 Intramuscular   Manufacturer: [place]   Lot: [id number]   [place]: [id number]

## 2019-04-16 DIAGNOSIS — E7800 Pure hypercholesterolemia, unspecified: Secondary | ICD-10-CM | POA: Diagnosis not present

## 2019-04-16 DIAGNOSIS — I4891 Unspecified atrial fibrillation: Secondary | ICD-10-CM | POA: Diagnosis not present

## 2019-04-16 DIAGNOSIS — F329 Major depressive disorder, single episode, unspecified: Secondary | ICD-10-CM | POA: Diagnosis not present

## 2019-04-16 DIAGNOSIS — M1711 Unilateral primary osteoarthritis, right knee: Secondary | ICD-10-CM | POA: Diagnosis not present

## 2019-04-16 DIAGNOSIS — M810 Age-related osteoporosis without current pathological fracture: Secondary | ICD-10-CM | POA: Diagnosis not present

## 2019-04-16 DIAGNOSIS — I119 Hypertensive heart disease without heart failure: Secondary | ICD-10-CM | POA: Diagnosis not present

## 2019-04-16 DIAGNOSIS — M1712 Unilateral primary osteoarthritis, left knee: Secondary | ICD-10-CM | POA: Diagnosis not present

## 2019-04-16 DIAGNOSIS — I10 Essential (primary) hypertension: Secondary | ICD-10-CM | POA: Diagnosis not present

## 2019-04-16 DIAGNOSIS — E119 Type 2 diabetes mellitus without complications: Secondary | ICD-10-CM | POA: Diagnosis not present

## 2019-04-16 DIAGNOSIS — E1169 Type 2 diabetes mellitus with other specified complication: Secondary | ICD-10-CM | POA: Diagnosis not present

## 2019-04-16 DIAGNOSIS — M160 Bilateral primary osteoarthritis of hip: Secondary | ICD-10-CM | POA: Diagnosis not present

## 2019-04-17 DIAGNOSIS — E1122 Type 2 diabetes mellitus with diabetic chronic kidney disease: Secondary | ICD-10-CM | POA: Diagnosis not present

## 2019-04-17 DIAGNOSIS — N1832 Chronic kidney disease, stage 3b: Secondary | ICD-10-CM | POA: Diagnosis not present

## 2019-04-17 DIAGNOSIS — I5032 Chronic diastolic (congestive) heart failure: Secondary | ICD-10-CM | POA: Diagnosis not present

## 2019-04-17 DIAGNOSIS — I130 Hypertensive heart and chronic kidney disease with heart failure and stage 1 through stage 4 chronic kidney disease, or unspecified chronic kidney disease: Secondary | ICD-10-CM | POA: Diagnosis not present

## 2019-04-17 DIAGNOSIS — N23 Unspecified renal colic: Secondary | ICD-10-CM | POA: Diagnosis not present

## 2019-04-17 DIAGNOSIS — T83123D Displacement of other urinary stents, subsequent encounter: Secondary | ICD-10-CM | POA: Diagnosis not present

## 2019-04-18 ENCOUNTER — Telehealth: Payer: Self-pay | Admitting: *Deleted

## 2019-04-18 NOTE — Telephone Encounter (Signed)
-----   Message from [name], MD sent at 04/16/2019  8:17 PM EST ----- Good AHI and compliance.  Continue current PAP settings.

## 2019-04-18 NOTE — Telephone Encounter (Addendum)
Informed patient of compliance results and verbalized understanding was indicated. Patient is aware and agreeable to AHI being within range at 1.7. Patient is aware and agreeable to being in compliance with machine usage. Patient is aware and agreeable to no change in current pressures.

## 2019-04-19 ENCOUNTER — Other Ambulatory Visit: Payer: Self-pay

## 2019-04-19 ENCOUNTER — Ambulatory Visit
Admission: RE | Admit: 2019-04-19 | Discharge: 2019-04-19 | Disposition: A | Discharge status: Home / Self Care | Payer: Medicare Other | Source: Ambulatory Visit | Attending: Physician Assistant | Admitting: Physician Assistant

## 2019-04-19 DIAGNOSIS — K449 Diaphragmatic hernia without obstruction or gangrene: Secondary | ICD-10-CM | POA: Diagnosis not present

## 2019-04-19 DIAGNOSIS — K224 Dyskinesia of esophagus: Secondary | ICD-10-CM | POA: Diagnosis not present

## 2019-04-19 DIAGNOSIS — R1310 Dysphagia, unspecified: Secondary | ICD-10-CM

## 2019-04-19 DIAGNOSIS — R1319 Other dysphagia: Secondary | ICD-10-CM

## 2019-04-23 ENCOUNTER — Other Ambulatory Visit (HOSPITAL_COMMUNITY): Payer: Self-pay | Admitting: *Deleted

## 2019-04-23 MED ORDER — SOTALOL HCL 80 MG PO TABS: ORAL_TABLET | ORAL | 3 refills | Status: DC

## 2019-04-23 NOTE — Telephone Encounter (Signed)
Pt's medication was sent to pt's pharmacy as requested. Confirmation received.  °

## 2019-04-23 NOTE — Telephone Encounter (Signed)
Patient called stating she needs a refill of her sotalol sent [facility] as[facility] that is filled by Dr. Radford Pax. Pt was unable to get through the phone due to long hold times. Thanks!

## 2019-04-27 DIAGNOSIS — E1169 Type 2 diabetes mellitus with other specified complication: Secondary | ICD-10-CM | POA: Diagnosis not present

## 2019-04-27 DIAGNOSIS — Z7984 Long term (current) use of oral hypoglycemic drugs: Secondary | ICD-10-CM | POA: Diagnosis not present

## 2019-04-27 DIAGNOSIS — E7800 Pure hypercholesterolemia, unspecified: Secondary | ICD-10-CM | POA: Diagnosis not present

## 2019-04-27 DIAGNOSIS — I119 Hypertensive heart disease without heart failure: Secondary | ICD-10-CM | POA: Diagnosis not present

## 2019-04-27 DIAGNOSIS — Z6841 Body Mass Index (BMI) 40.0 and over, adult: Secondary | ICD-10-CM | POA: Diagnosis not present

## 2019-05-01 ENCOUNTER — Ambulatory Visit: Payer: Medicare Other

## 2019-05-09 ENCOUNTER — Other Ambulatory Visit: Payer: Self-pay | Admitting: Cardiology

## 2019-05-09 ENCOUNTER — Telehealth: Payer: Self-pay | Admitting: Cardiology

## 2019-05-09 MED ORDER — POTASSIUM CHLORIDE CRYS ER 10 MEQ PO TBCR: EXTENDED_RELEASE_TABLET | ORAL | 3 refills | Status: DC

## 2019-05-09 MED ORDER — FUROSEMIDE 20 MG PO TABS: ORAL_TABLET | ORAL | 3 refills | Status: DC

## 2019-05-09 NOTE — Telephone Encounter (Signed)
*  STAT* If patient is at the pharmacy, call can be transferred to refill team.   1. Which medications need to be refilled? (please list name of each medication and dose if known)  potassium chloride (KLOR-CON) 10 MEQ tablet furosemide (LASIX) 20 MG tablet  2. Which pharmacy/location (including street and city if local pharmacy) is medication to be sent to? [id number] [place] [place] [facility] [address] [facility] [address]  3. Do they need a 30 day or 90 day supply? 90 day   Patient states these two medications only [facility].

## 2019-05-09 NOTE — Telephone Encounter (Signed)
Pt's medication was sent to pt's pharmacy as requested. Confirmation received.  °

## 2019-05-23 ENCOUNTER — Other Ambulatory Visit: Payer: Self-pay

## 2019-05-23 ENCOUNTER — Ambulatory Visit
Admission: RE | Admit: 2019-05-23 | Discharge: 2019-05-23 | Disposition: A | Discharge status: Home / Self Care | Payer: Medicare Other | Source: Ambulatory Visit | Attending: Family Medicine | Admitting: Family Medicine

## 2019-05-23 DIAGNOSIS — Z1231 Encounter for screening mammogram for malignant neoplasm of breast: Secondary | ICD-10-CM

## 2019-05-29 DIAGNOSIS — I10 Essential (primary) hypertension: Secondary | ICD-10-CM | POA: Diagnosis not present

## 2019-05-29 DIAGNOSIS — M810 Age-related osteoporosis without current pathological fracture: Secondary | ICD-10-CM | POA: Diagnosis not present

## 2019-05-29 DIAGNOSIS — E7800 Pure hypercholesterolemia, unspecified: Secondary | ICD-10-CM | POA: Diagnosis not present

## 2019-05-29 DIAGNOSIS — F329 Major depressive disorder, single episode, unspecified: Secondary | ICD-10-CM | POA: Diagnosis not present

## 2019-05-29 DIAGNOSIS — I119 Hypertensive heart disease without heart failure: Secondary | ICD-10-CM | POA: Diagnosis not present

## 2019-05-29 DIAGNOSIS — M160 Bilateral primary osteoarthritis of hip: Secondary | ICD-10-CM | POA: Diagnosis not present

## 2019-05-29 DIAGNOSIS — E119 Type 2 diabetes mellitus without complications: Secondary | ICD-10-CM | POA: Diagnosis not present

## 2019-05-29 DIAGNOSIS — M1712 Unilateral primary osteoarthritis, left knee: Secondary | ICD-10-CM | POA: Diagnosis not present

## 2019-05-29 DIAGNOSIS — M1711 Unilateral primary osteoarthritis, right knee: Secondary | ICD-10-CM | POA: Diagnosis not present

## 2019-05-29 DIAGNOSIS — E1169 Type 2 diabetes mellitus with other specified complication: Secondary | ICD-10-CM | POA: Diagnosis not present

## 2019-05-29 DIAGNOSIS — I4891 Unspecified atrial fibrillation: Secondary | ICD-10-CM | POA: Diagnosis not present

## 2019-05-30 ENCOUNTER — Telehealth: Payer: Self-pay | Admitting: Cardiology

## 2019-05-30 NOTE — Telephone Encounter (Signed)
cv  Patient calling the office for samples of medication:   1.  What medication and dosage are you requesting samples for? rivaroxaban (XARELTO) 20 MG TABS tablet  2.  Are you currently out of this medication? No    Patient called and said her Diabetic Medicine has pushed her into the next bracket for her medication. As a result she has to pay ~$400 for a 90 day supply of xarelto. She was hoping the office would be able to provide her with some samples or some other type of assistance to help defer the cost

## 2019-05-31 NOTE — Telephone Encounter (Signed)
Wyonia Hough, LPN, can you advise on this matter, please. Thanks

## 2019-05-31 NOTE — Telephone Encounter (Signed)
**Note De-Identified Nonna Renninger Obfuscation** The pt states that Karen Rasmussen is handleing her [name] Pt Asst application. I encouraged her to let me handle but she wants to let them do it.  She is advised that we are leaving her 2 bottles of Xarelto 20 mg downstairs at our screening table for her to pick up.  I have advised her that she needs to complete her [employer] pt asst application ASAP and get it back to [facility] to be completed by them and then faxed to [employer] Pt Asst program.  She verbalized understanding and thanked me for calling her back.

## 2019-06-15 NOTE — Telephone Encounter (Signed)
Tele consent obtained  

## 2019-06-25 DIAGNOSIS — N200 Calculus of kidney: Secondary | ICD-10-CM | POA: Diagnosis not present

## 2019-06-25 DIAGNOSIS — R3915 Urgency of urination: Secondary | ICD-10-CM | POA: Diagnosis not present

## 2019-06-25 DIAGNOSIS — N3941 Urge incontinence: Secondary | ICD-10-CM | POA: Diagnosis not present

## 2019-07-02 ENCOUNTER — Telehealth: Payer: Self-pay | Admitting: Cardiology

## 2019-07-02 NOTE — Telephone Encounter (Signed)
New Message  the patient is on the phone wanting to know if her other dr. Sabra Heck and fill out the form for her to get xalrelto

## 2019-07-03 NOTE — Telephone Encounter (Signed)
**Note De-Identified Cobey Raineri Obfuscation** The pt is advised that we have not received a [employer] Pt Asst app from her PCP, Dr Sabra Heck. She is aware that I will fill out the provider page of the application and that Dr Radford Pax will sign it once we receive it from Dr Sanjuan Dame office.  She states that she is not sure if Dr Sabra Heck will sign her [employer] application or not but it will be faxed to our office "attention Jeani Hawking" If not.

## 2019-07-04 DIAGNOSIS — M1711 Unilateral primary osteoarthritis, right knee: Secondary | ICD-10-CM | POA: Diagnosis not present

## 2019-07-04 DIAGNOSIS — E1169 Type 2 diabetes mellitus with other specified complication: Secondary | ICD-10-CM | POA: Diagnosis not present

## 2019-07-04 DIAGNOSIS — I119 Hypertensive heart disease without heart failure: Secondary | ICD-10-CM | POA: Diagnosis not present

## 2019-07-04 DIAGNOSIS — I4891 Unspecified atrial fibrillation: Secondary | ICD-10-CM | POA: Diagnosis not present

## 2019-07-04 DIAGNOSIS — M1712 Unilateral primary osteoarthritis, left knee: Secondary | ICD-10-CM | POA: Diagnosis not present

## 2019-07-04 DIAGNOSIS — I10 Essential (primary) hypertension: Secondary | ICD-10-CM | POA: Diagnosis not present

## 2019-07-04 DIAGNOSIS — E7800 Pure hypercholesterolemia, unspecified: Secondary | ICD-10-CM | POA: Diagnosis not present

## 2019-07-04 DIAGNOSIS — E119 Type 2 diabetes mellitus without complications: Secondary | ICD-10-CM | POA: Diagnosis not present

## 2019-07-04 DIAGNOSIS — F329 Major depressive disorder, single episode, unspecified: Secondary | ICD-10-CM | POA: Diagnosis not present

## 2019-07-04 DIAGNOSIS — M160 Bilateral primary osteoarthritis of hip: Secondary | ICD-10-CM | POA: Diagnosis not present

## 2019-07-04 DIAGNOSIS — M810 Age-related osteoporosis without current pathological fracture: Secondary | ICD-10-CM | POA: Diagnosis not present

## 2019-07-11 ENCOUNTER — Other Ambulatory Visit: Payer: Self-pay

## 2019-07-11 ENCOUNTER — Telehealth: Payer: Self-pay | Admitting: Cardiology

## 2019-07-11 MED ORDER — CARVEDILOL 3.125 MG PO TABS: 3.1250 mg | ORAL_TABLET | Freq: Two times a day (BID) | ORAL | 3 refills | Status: DC

## 2019-07-11 NOTE — Telephone Encounter (Signed)
New prescription has been sent for a 90 supply with 3 refills

## 2019-07-11 NOTE — Telephone Encounter (Signed)
New Message   Pt c/o medication issue:  1. Name of Medication: carvedilol (COREG) 3.125 MG tablet   2. How are you currently taking this medication (dosage and times per day)? Take 1 tablet (3.125 mg total) by mouth 2 (two) times daily with a meal.  3. Are you having a reaction (difficulty breathing--STAT)?   4. What is your medication issue? Patient is wanting to know is there a reason that she did not have 3 refills sent for this medication and had it sent for the sotal. She wants to know can she have the 3. If so please send the refill.

## 2019-09-06 ENCOUNTER — Telehealth: Payer: Self-pay

## 2019-09-06 NOTE — Telephone Encounter (Signed)
I have a patient calling saying that she needs patient assistance for the medication rivaroxaban (XARELTO) 20 MG TABS tablet. Please call back

## 2019-09-07 DIAGNOSIS — I10 Essential (primary) hypertension: Secondary | ICD-10-CM | POA: Diagnosis not present

## 2019-09-07 DIAGNOSIS — E7800 Pure hypercholesterolemia, unspecified: Secondary | ICD-10-CM | POA: Diagnosis not present

## 2019-09-07 DIAGNOSIS — I119 Hypertensive heart disease without heart failure: Secondary | ICD-10-CM | POA: Diagnosis not present

## 2019-09-07 DIAGNOSIS — M810 Age-related osteoporosis without current pathological fracture: Secondary | ICD-10-CM | POA: Diagnosis not present

## 2019-09-07 DIAGNOSIS — M1712 Unilateral primary osteoarthritis, left knee: Secondary | ICD-10-CM | POA: Diagnosis not present

## 2019-09-07 DIAGNOSIS — E119 Type 2 diabetes mellitus without complications: Secondary | ICD-10-CM | POA: Diagnosis not present

## 2019-09-07 DIAGNOSIS — M1711 Unilateral primary osteoarthritis, right knee: Secondary | ICD-10-CM | POA: Diagnosis not present

## 2019-09-07 DIAGNOSIS — I4891 Unspecified atrial fibrillation: Secondary | ICD-10-CM | POA: Diagnosis not present

## 2019-09-07 DIAGNOSIS — M160 Bilateral primary osteoarthritis of hip: Secondary | ICD-10-CM | POA: Diagnosis not present

## 2019-09-07 DIAGNOSIS — E1169 Type 2 diabetes mellitus with other specified complication: Secondary | ICD-10-CM | POA: Diagnosis not present

## 2019-09-07 DIAGNOSIS — F329 Major depressive disorder, single episode, unspecified: Secondary | ICD-10-CM | POA: Diagnosis not present

## 2019-09-07 NOTE — Telephone Encounter (Signed)
**Note De-Identified Davarius Ridener Obfuscation** The pt states that she applied for asst through [name] [facility] [place] for her Xarelto with [employer] but that her application has been misplaced and they are trying to locate it.  She is currently out of Xarelto and is requesting samples.  I gave her [employer] pt [employer]st [employer] phone number and advised her to call them and request that they mail her an application. She is aware to complete the application, obtain any documents required by [employer] and to drop all off at Dr Landis Gandy office and that we will handle the provider part of the application and will fax all to [employer].   She is also advised that we are leaving her some Xarelto 20 mg samples in the front office for her to pick up.  She verbalized understanding to all info given and thanked me for our assistance.

## 2019-09-07 NOTE — Telephone Encounter (Signed)
Patient calling the office for samples of medication:   1.  What medication and dosage are you requesting samples for? rivaroxaban (XARELTO) 20 MG TABS tablet  2.  Are you currently out of this medication?  No  Patient is calling to follow up in regards to request for samples of the medication.

## 2019-09-10 DIAGNOSIS — M25511 Pain in right shoulder: Secondary | ICD-10-CM | POA: Diagnosis not present

## 2019-09-10 DIAGNOSIS — M1712 Unilateral primary osteoarthritis, left knee: Secondary | ICD-10-CM | POA: Diagnosis not present

## 2019-09-10 DIAGNOSIS — M1711 Unilateral primary osteoarthritis, right knee: Secondary | ICD-10-CM | POA: Diagnosis not present

## 2019-09-17 ENCOUNTER — Other Ambulatory Visit: Payer: Self-pay

## 2019-09-17 ENCOUNTER — Telehealth: Payer: Self-pay | Admitting: Cardiology

## 2019-09-17 NOTE — Telephone Encounter (Signed)
**Note De-Identified Ayeden Gladman Obfuscation** The pt returned my call. She states that she called [name] and was advised that her income exceeds their income limit and that she would be denied for asst with her Xarelto for that reason.  She states that she then called Herb Grays and was advised that there are no income limits with their program and that her Xarelto will cost her $85/90 day supply and that this is what she wants to do.  She states that she gave [facility] Grays all of Dr Landis Gandy information and that they advised her that they will reach out to our office for a Xarelto 20 mg (Take I tablet daily with supper) #90 RX to [facility] (Pharmacy for [employer]) at phone number [phone] she does not have their fax number.  I advised the pt that I will be checking for a fax from [facility] and if I do not receive it today I will call them as I cannot find that pharmacy with information given in our system.  She states that she is still planning to pick up her Xarelto samles from [place] on Wednesday 8/11. She thanked me for our assistance.

## 2019-09-17 NOTE — Telephone Encounter (Signed)
Called pt to inform her that she has samples of Xarelto here at the office to be picked up. I advised the pt that when she comes to pick up her samples, if she could please bring her pt assistance application, with all the information needed to complete the application and if she has any other problems, questions or concerns, to give our office a call. Pt verbalized understanding.

## 2019-09-17 NOTE — Telephone Encounter (Signed)
FYI

## 2019-09-17 NOTE — Telephone Encounter (Signed)
**Note De-Identified Kalecia Hartney Obfuscation** Xarelto 20 mg RX request received from [facility] requesting that we complete Dr Landis Gandy information on the RX request, have Dr Radford Pax sign, date and to fax back to them.  I have completed the RX and emailed it to Dr Landis Gandy nurse so she can obtain Dr Landis Gandy signature and to fax to [facility] at fax number written on cover letter.

## 2019-09-17 NOTE — Telephone Encounter (Signed)
Patient calling the office for samples of medication:   1.  What medication and dosage are you requesting samples for? rivaroxaban (XARELTO) 20 MG TABS tablet  2.  Are you currently out of this medication? Has about a 2 week supply left    Karen Rasmussen is calling stating there is no record of her Karen Rasmussen and Karen Rasmussen forms for Xarelto and she is having to resubmit them, she is requesting samples due to this. She is also wanting to know if Dr. Radford Pax has the form so she can go ahead and fill it out again so she can pick it up. If not she states she has another form and could bring it in Wednesday. She was advised she will have to resubmit this form for next year anytime after October and would like to know if she can go ahead and have two forms filled out due to this as well. Please advise.

## 2019-09-17 NOTE — Telephone Encounter (Signed)
She said she will drop off her paperwork on Wednesday and pick up her samples. Pt verbalized understanding

## 2019-09-17 NOTE — Telephone Encounter (Signed)
**Note De-Identified Maziyah Vessel Obfuscation** I left a message asking the pt to call Jeani Hawking at [facility] at [phone] concerning her questions about [name] and [facility].

## 2019-09-17 NOTE — Telephone Encounter (Signed)
Patient called she stated she spoke to [place] she needs to speak to someone about the the application before she comes in on Wednesday.

## 2019-09-18 NOTE — Telephone Encounter (Signed)
Called pt back to inform her that she has samples at the front desk for her to pick up already, 4 bottles, and if she has any other problems, questions or concerns, to give our office a call back. Pt verbalized understanding.

## 2019-09-18 NOTE — Telephone Encounter (Signed)
Patient is requesting one more bottle of samples to make sure she will have enough. She states it will be a total of 3 bottles on samples.

## 2019-09-26 ENCOUNTER — Other Ambulatory Visit: Payer: Self-pay | Admitting: Cardiology

## 2019-09-27 ENCOUNTER — Other Ambulatory Visit: Payer: Self-pay

## 2019-09-27 MED ORDER — SPIRONOLACTONE 25 MG PO TABS: ORAL_TABLET | ORAL | 2 refills | Status: DC

## 2019-09-28 DIAGNOSIS — E1169 Type 2 diabetes mellitus with other specified complication: Secondary | ICD-10-CM | POA: Diagnosis not present

## 2019-09-28 DIAGNOSIS — M810 Age-related osteoporosis without current pathological fracture: Secondary | ICD-10-CM | POA: Diagnosis not present

## 2019-09-28 DIAGNOSIS — Z86010 Personal history of colonic polyps: Secondary | ICD-10-CM | POA: Diagnosis not present

## 2019-09-28 DIAGNOSIS — Z0000 Encounter for general adult medical examination without abnormal findings: Secondary | ICD-10-CM | POA: Diagnosis not present

## 2019-09-28 DIAGNOSIS — Z6841 Body Mass Index (BMI) 40.0 and over, adult: Secondary | ICD-10-CM | POA: Diagnosis not present

## 2019-10-05 DIAGNOSIS — E7800 Pure hypercholesterolemia, unspecified: Secondary | ICD-10-CM | POA: Diagnosis not present

## 2019-10-05 DIAGNOSIS — I10 Essential (primary) hypertension: Secondary | ICD-10-CM | POA: Diagnosis not present

## 2019-10-05 DIAGNOSIS — M1711 Unilateral primary osteoarthritis, right knee: Secondary | ICD-10-CM | POA: Diagnosis not present

## 2019-10-05 DIAGNOSIS — F329 Major depressive disorder, single episode, unspecified: Secondary | ICD-10-CM | POA: Diagnosis not present

## 2019-10-05 DIAGNOSIS — E1169 Type 2 diabetes mellitus with other specified complication: Secondary | ICD-10-CM | POA: Diagnosis not present

## 2019-10-05 DIAGNOSIS — I4891 Unspecified atrial fibrillation: Secondary | ICD-10-CM | POA: Diagnosis not present

## 2019-10-05 DIAGNOSIS — E119 Type 2 diabetes mellitus without complications: Secondary | ICD-10-CM | POA: Diagnosis not present

## 2019-10-05 DIAGNOSIS — K219 Gastro-esophageal reflux disease without esophagitis: Secondary | ICD-10-CM | POA: Diagnosis not present

## 2019-10-05 DIAGNOSIS — M160 Bilateral primary osteoarthritis of hip: Secondary | ICD-10-CM | POA: Diagnosis not present

## 2019-10-05 DIAGNOSIS — I119 Hypertensive heart disease without heart failure: Secondary | ICD-10-CM | POA: Diagnosis not present

## 2019-10-05 DIAGNOSIS — M810 Age-related osteoporosis without current pathological fracture: Secondary | ICD-10-CM | POA: Diagnosis not present

## 2019-10-05 DIAGNOSIS — M1712 Unilateral primary osteoarthritis, left knee: Secondary | ICD-10-CM | POA: Diagnosis not present

## 2019-10-11 ENCOUNTER — Other Ambulatory Visit: Payer: Self-pay | Admitting: Family Medicine

## 2019-10-11 DIAGNOSIS — M810 Age-related osteoporosis without current pathological fracture: Secondary | ICD-10-CM

## 2019-10-11 DIAGNOSIS — Z1231 Encounter for screening mammogram for malignant neoplasm of breast: Secondary | ICD-10-CM

## 2019-10-27 ENCOUNTER — Other Ambulatory Visit: Payer: Self-pay | Admitting: Cardiology

## 2019-11-07 NOTE — Progress Notes (Signed)
Virtual Visit via Video Note   This visit type was conducted due to national recommendations for restrictions regarding the COVID-19 Pandemic (e.g. social distancing) in an effort to limit this patient's exposure and mitigate transmission in our community.  Due to her co-morbid illnesses, this patient is at least at moderate risk for complications without adequate follow up.  This format is felt to be most appropriate for this patient at this time.  All issues noted in this document were discussed and addressed.  A limited physical exam was performed with this format.  Please refer to the patient's chart for her consent to telehealth for [facility].       Date:  11/13/2019   ID:  Karen Rasmussen, DOB Mar 14, 1945, MRN [id number] The patient was identified using 2 identifiers.  Patient Location: Home Provider Location: Office/Clinic  PCP:  Kathyrn Lass, MD  Cardiologist:  Fransico Him, MD  Electrophysiologist:  None   Evaluation Performed:  Follow-Up Visit  Chief Complaint:  Follow up  History of Present Illness:    Karen Rasmussen is a 74 y.o. female with with a history of normal coronary arteries with calcium score of 0 on coronary CTA in 05/4008, chronic diastolic CHF/non-ischemic cardiomyopathy with EF of 45-50%, paroxysmal atrial fibrillation s/p DCCV x2 and now on Sotalol and Xarelto, obstructive sleep apnea on CPAP, hypertension, diabetes mellitus, and morbid obesity.   Patient says she needs a new cpap machine.  Denies chest pain, dyspnena, palpitations, bleeding. Uses an exercise bike 15-20 min/daily. Received her covid booster and flu vaccine last week.  The patient does not have symptoms concerning for COVID-19 infection (fever, chills, cough, or new shortness of breath).    Past Medical History:  Diagnosis Date  . Abdominal pain, left lower quadrant   . Asthmatic bronchitis   . Benign essential HTN 04/26/2016  . Bursitis of hip   . Chronic diastolic CHF (congestive heart  failure) ([place])   . DCM (dilated cardiomyopathy) ([place])    EF 45-50%  . GERD (gastroesophageal reflux disease)   . High cholesterol   . History of kidney stones   . Hypersomnia   . Morbid obesity with BMI of 50.0-59.9, adult ([place])   . OSA on CPAP   . Osteoarthritis    "right hip; both knees" (09/21/2016)  . Persistent atrial fibrillation ([place]) 04/26/2016  . Type II diabetes mellitus ([place])   . Vitamin D deficiency disease    Past Surgical History:  Procedure Laterality Date  . ABDOMINAL HYSTERECTOMY    . APPENDECTOMY    . BALLOON DILATION N/A 11/17/2017   Procedure: BALLOON DILATION;  Surgeon: Ronnette Juniper, MD;  Location: [facility];  Service: Gastroenterology;  Laterality: N/A;  . BIOPSY  11/17/2017   Procedure: BIOPSY;  Surgeon: Ronnette Juniper, MD;  Location: [facility] ENDOSCOPY;  Service: Gastroenterology;;  . CARDIAC CATHETERIZATION    . CARDIOVERSION N/A 06/11/2016   Procedure: CARDIOVERSION;  Surgeon: Dorothy Spark, MD;  Location: [facility] ENDOSCOPY;  Service: Cardiovascular;  Laterality: N/A;  . CARDIOVERSION N/A 07/16/2016   Procedure: CARDIOVERSION;  Surgeon: Thayer Headings, MD;  Location: [facility] ENDOSCOPY;  Service: Cardiovascular;  Laterality: N/A;  . CARDIOVERSION N/A 09/23/2016   Procedure: CARDIOVERSION;  Surgeon: Sanda Klein, MD;  Location: [facility] ENDOSCOPY;  Service: Cardiovascular;  Laterality: N/A;  . CHOLECYSTECTOMY OPEN    . COLONOSCOPY N/A 11/17/2017   Procedure: COLONOSCOPY;  Surgeon: Ronnette Juniper, MD;  Location: [facility] ENDOSCOPY;  Service: Gastroenterology;  Laterality: N/A;  . cortisone injection  to right knee    . costisone injection to left knee  11/03/2017  . CYSTOSCOPY WITH RETROGRADE PYELOGRAM, URETEROSCOPY AND STENT PLACEMENT Left 01/14/2019   Procedure: CYSTOSCOPy  STENT PLACEMENT;  Surgeon: Robley Fries, MD;  Location: [facility] ORS;  Service: Urology;  Laterality: Left;  . CYSTOSCOPY WITH RETROGRADE PYELOGRAM, URETEROSCOPY AND STENT PLACEMENT Left 03/06/2019   Procedure:  CYSTOSCOPY WITH RETROGRADE PYELOGRAM, URETEROSCOPY AND STENT PLACEMENT;  Surgeon: Robley Fries, MD;  Location: [facility] ORS;  Service: Urology;  Laterality: Left;  90 MINS  . CYSTOSCOPY WITH STENT PLACEMENT Left 03/07/2019   Procedure: CYSTOSCOPY WITH , URETEROSCOPY/ STENT PLACEMENT;  Surgeon: Ardis Hughs, MD;  Location: [facility] ORS;  Service: Urology;  Laterality: Left;  . DILATION AND CURETTAGE OF UTERUS     S/P miscarriage  . ESOPHAGOGASTRODUODENOSCOPY N/A 11/17/2017   Procedure: ESOPHAGOGASTRODUODENOSCOPY (EGD);  Surgeon: Ronnette Juniper, MD;  Location: [facility];  Service: Gastroenterology;  Laterality: N/A;  . HOLMIUM LASER APPLICATION Left 07/15/3014   Procedure: HOLMIUM LASER APPLICATION;  Surgeon: Robley Fries, MD;  Location: [facility] ORS;  Service: Urology;  Laterality: Left;  . NASAL SEPTUM SURGERY    . POLYPECTOMY  11/17/2017   Procedure: POLYPECTOMY;  Surgeon: Ronnette Juniper, MD;  Location: [facility] ENDOSCOPY;  Service: Gastroenterology;;  . TONSILLECTOMY       Current Meds  Medication Sig  . acetaminophen (TYLENOL) 650 MG CR tablet Take 650 mg by mouth 2 (two) times daily as needed for pain.  Marland Kitchen alendronate (FOSAMAX) 70 MG tablet Take 70 mg by mouth every Sunday. Take with a full glass of water on an empty stomach.   . Biotin w/ Vitamins C & E (HAIR SKIN & NAILS GUMMIES PO) Take 2 tablets by mouth daily.   . blood glucose meter kit and supplies Dispense based on patient and insurance preference. Use up to four times daily as directed. (FOR ICD-10 E10.9, E11.9).  . Calcium-Vitamin D-Vitamin K (CALCIUM + D + K PO) Take 1 tablet by mouth daily.  . carvedilol (COREG) 3.125 MG tablet TAKE 1 TABLET TWICE DAILY  WITH MEALS  . diphenhydrAMINE-zinc acetate (BENADRYL) cream Apply 1 application topically 3 (three) times daily as needed for itching.  . folic acid (FOLVITE) 010 MCG tablet Take 800 mcg by mouth daily.  . Folic Acid 0.8 MG CAPS Take by mouth daily.  . furosemide (LASIX) 20 MG tablet  TAKE 1 TABLET (20 MG) DAILY AND THEN 1 TABLET AS NEEDED DAILY FOR SWELLING....  . gabapentin (NEURONTIN) 300 MG capsule Take 300 mg by mouth at bedtime.  Marland Kitchen HYDROcodone-acetaminophen (NORCO) 5-325 MG tablet Take 1 tablet by mouth every 4 (four) hours as needed for moderate pain (kidney stone pain).  . insulin aspart (NOVOLOG) 100 UNIT/ML FlexPen Inject 12 Units into the skin 3 (three) times daily with meals.  . Insulin Glargine (BASAGLAR KWIKPEN [place]) Inject 34 Units into the skin daily.  . Insulin Pen Needle 31G X 5 MM MISC 12 Units by Does not apply route 3 (three) times daily.  . Ipratropium-Albuterol (COMBIVENT RESPIMAT) 20-100 MCG/ACT AERS respimat Inhale 1 puff into the lungs every 6 (six) hours as needed for wheezing or shortness of breath. Use 3 times daily x 5 days, then every 6 hours as needed.  . Misc Natural Products (GLUCOSAMINE CHONDROITIN TRIPLE) TABS Take 2 tablets by mouth daily.   . Multiple Vitamins-Minerals (CENTRUM SILVER) CHEW Chew 2 tablets by mouth daily. soft chews  . oxybutynin (DITROPAN) 5 MG tablet Take 5  mg by mouth at bedtime.  . pantoprazole (PROTONIX) 40 MG tablet Take 1 tablet (40 mg total) by mouth daily.  . phenazopyridine (PYRIDIUM) 100 MG tablet Take 1 tablet (100 mg total) by mouth 3 (three) times daily as needed for pain.  . potassium chloride (KLOR-CON) 10 MEQ tablet TAKE 2 TABLETS BY MOUTH TWICE DAILY  . psyllium (METAMUCIL SMOOTH TEXTURE) 28 % packet Take 1 packet by mouth 2 (two) times daily.   . rivaroxaban (XARELTO) 20 MG TABS tablet Take 1 tablet (20 mg total) by mouth every evening.  . simvastatin (ZOCOR) 10 MG tablet Take 1 tablet (10 mg total) by mouth at bedtime.  . sotalol (BETAPACE) 80 MG tablet TAKE 1 TABLET(80 MG) BY MOUTH EVERY 12 HOURS  . spironolactone (ALDACTONE) 25 MG tablet TAKE 1/2 TABLET (12.5MG    TOTAL) DAILY  . triamcinolone cream (KENALOG) 0.1 % Apply 1 application topically 2 (two) times daily as needed (itching legs).   .  [DISCONTINUED] aspirin EC 81 MG EC tablet Take 1 tablet (81 mg total) by mouth daily.  . [DISCONTINUED] furosemide (LASIX) 20 MG tablet TAKE 1 TABLET (20 MG) DAILY AND THEN 1 TABLET AS NEEDED DAILY FOR SWELLING....PT OVERDUE FOR AN APPT.  NEEDS TO SCHEDULE FOR FUTURE REFILLS     Allergies:   No healthtouch food allergies   Social History   Tobacco Use  . Smoking status: Never Smoker  . Smokeless tobacco: Never Used  Vaping Use  . Vaping Use: Never used  Substance Use Topics  . Alcohol use: Not Currently  . Drug use: No     Family Hx: The patient's family history includes CAD in her mother; Cancer in her father; Diabetes in her brother and father; Heart failure in her father; Hypertension in her brother. There is no history of Breast cancer.  ROS:   Please see the history of present illness.      All other systems reviewed and are negative.   Prior CV studies:   The following studies were reviewed today:  The echo 4/2018Study Conclusions   - Left ventricle: The cavity size was normal. There was moderate   concentric hypertrophy. Systolic function was mildly reduced. The   estimated ejection fraction was in the range of 45% to 50%.   Diffuse hypokinesis. - Aortic valve: Transvalvular velocity was within the normal range.   There was no stenosis. There was no regurgitation. - Mitral valve: Transvalvular velocity was within the normal range.   There was no evidence for stenosis. There was no regurgitation. - Right ventricle: The cavity size was normal. Wall thickness was   normal. Systolic function was normal. - Atrial septum: No defect or patent foramen ovale was identified. - Tricuspid valve: There was trivial regurgitation. - Pulmonary arteries: Systolic pressure was within the normal   range. PA peak pressure: 25 mm Hg (S).   Nuclear stress test 4/2018Study Highlights       There was no ST segment deviation noted during stress.  Defect 1: There is a medium defect  of moderate severity present in the basal anteroseptal, basal inferior, mid inferior and apical inferior location.  This is a low risk study.   Low risk stress nuclear study with fixed defects in the basal septum and inferior walls (attenuation vs infarct); no ischemia; study not gated due to atrial fibrillation; suggest echo to assess LV function.       Coronary CTA 8/2018FINDINGS: Non-cardiac: See separate report from [facility]. No significant findings  on limited lung and soft tissue windows.   Calcium Score: 0   Aortic root 2.8 cm Dilated MPA 31 mm with LPA 28 mm and RPA 28 mm Cannot r/o thrombus in tip of LA appendage but likely poor contrast mixing Delayed appendage images not performed   As study was done to r/o CAD   Coronary Arteries: Right dominant with no anomalies   LM: Normal   LAD:  Normal   D1: Normal   D2: Normal   Circumflex: Normal   OM1: Normal   OM2:  Normal   RCA:  Normal dominant   PDA:  Normal   PLA:  Normal   IMPRESSION: 1) Calcium score 0   2) Normal right dominant coronary arteries   3) Dilated main PA 31 mm compared to aortic root 28 mm   4) Cannot r/o LAA thrombus but more likely poor mixing of contrast   Jenkins Rouge     Electronically Signed   By: Jenkins Rouge M.D.   On: 09/24/2016 15:20         Labs/Other Tests and Data Reviewed:    EKG:  No ECG reviewed.  Recent Labs: 01/13/2019: B Natriuretic Peptide 242.1; TSH 2.426 01/17/2019: ALT 26 01/20/2019: Magnesium 2.0 03/02/2019: BUN 16; Creatinine, Ser 0.95; Potassium 4.4; Sodium 140 03/06/2019: Hemoglobin 12.2; Platelets 214   Recent Lipid Panel No results found for: CHOL, TRIG, HDL, CHOLHDL, LDLCALC, LDLDIRECT  Wt Readings from Last 3 Encounters:  11/13/19 277 lb (125.6 kg)  04/09/19 274 lb (124.3 kg)  03/15/19 276 lb (125.2 kg)     Objective:    Vital Signs:  BP 112/75   Pulse 84   Ht _0  (1.575 m)   Wt 277 lb (125.6 kg)   BMI 50.66 kg/m     VITAL SIGNS:  reviewed GEN:  no acute distress RESPIRATORY:  normal respiratory effort, symmetric expansion CARDIOVASCULAR:  no peripheral edema  ASSESSMENT & PLAN:     Dilated cardiomyopathy ejection fraction 45 to 50% on echo 05/2016 calcium score 0 and normal coronaries on coronary CT 09/2016- appears euvolemic. Would stop ASA unless PCP has her on it for another reason  Chronic diastolic CHF echo 02/6604 LVEF 55-60% mod LVH G2DD on lasix, spironolactone, coreg. Chronic LEE stable on lasix  Essential hypertension BP stable  Persistent atrial fibrillation on sotalol and Xarelto status post DCCV 09/2016 check labs  Mild AS on echo 02/2019-repeat echo 2 yrs.  OSA on PAP-needs new machine in November.will contact [place]  Obesity. Needs to lose weight for overall health.    COVID-19 Education: The signs and symptoms of COVID-19 were discussed with the patient and how to seek care for testing (follow up with PCP or arrange E-visit).   The importance of social distancing was discussed today.  Time:   Today, I have spent12 minutes with the patient with telehealth technology discussing the above problems.     Medication Adjustments/Labs and Tests Ordered: Current medicines are reviewed at length with the patient today.  Concerns regarding medicines are outlined above.   Tests Ordered: Orders Placed This Encounter  Procedures  . Basic metabolic panel  . CBC    Medication Changes: Meds ordered this encounter  Medications  . furosemide (LASIX) 20 MG tablet    Sig: TAKE 1 TABLET (20 MG) DAILY AND THEN 1 TABLET AS NEEDED DAILY FOR SWELLING....    Dispense:  60 tablet    Refill:  0    Follow Up:  In Person  in 6 month(s) Dr. Radford Pax  Signed, Ermalinda Barrios, PA-C  11/13/2019 12:48 PM    [place]

## 2019-11-08 DIAGNOSIS — Z23 Encounter for immunization: Secondary | ICD-10-CM | POA: Diagnosis not present

## 2019-11-13 ENCOUNTER — Encounter: Payer: Self-pay | Admitting: Physician Assistant

## 2019-11-13 ENCOUNTER — Telehealth (INDEPENDENT_AMBULATORY_CARE_PROVIDER_SITE_OTHER): Payer: Medicare Other | Admitting: Physician Assistant

## 2019-11-13 VITALS — BP 112/75 | HR 84 | Ht 62.0 in | Wt 277.0 lb

## 2019-11-13 DIAGNOSIS — I350 Nonrheumatic aortic (valve) stenosis: Secondary | ICD-10-CM | POA: Diagnosis not present

## 2019-11-13 DIAGNOSIS — I428 Other cardiomyopathies: Secondary | ICD-10-CM

## 2019-11-13 DIAGNOSIS — I480 Paroxysmal atrial fibrillation: Secondary | ICD-10-CM

## 2019-11-13 DIAGNOSIS — I10 Essential (primary) hypertension: Secondary | ICD-10-CM

## 2019-11-13 DIAGNOSIS — G4733 Obstructive sleep apnea (adult) (pediatric): Secondary | ICD-10-CM | POA: Diagnosis not present

## 2019-11-13 DIAGNOSIS — I5032 Chronic diastolic (congestive) heart failure: Secondary | ICD-10-CM

## 2019-11-13 MED ORDER — FUROSEMIDE 20 MG PO TABS: ORAL_TABLET | ORAL | 0 refills | Status: DC

## 2019-11-13 NOTE — Patient Instructions (Signed)
Medication Instructions:  Your physician has recommended you make the following change in your medication:   STOP taking Aspirin  *If you need a refill on your cardiac medications before your next appointment, please call your pharmacy*   Lab Work: BMET, CBC  If you have labs (blood work) drawn today and your tests are completely normal, you will receive your results only by: Marland Kitchen MyChart Message (if you have MyChart) OR . A paper copy in the mail If you have any lab test that is abnormal or we need to change your treatment, we will call you to review the results.   Testing/Procedures: None   Follow-Up: At [facility], you and your health needs are our priority.  As part of our continuing mission to provide you with exceptional heart care, we have created designated Provider Care Teams.  These Care Teams include your primary Cardiologist (physician) and Advanced Practice Providers (APPs -  Physician Assistants and Nurse Practitioners) who all work together to provide you with the care you need, when you need it.  We recommend signing up for the patient portal called "MyChart".  Sign up information is provided on this After Visit Summary.  MyChart is used to connect with patients for Virtual Visits (Telemedicine).  Patients are able to view lab/test results, encounter notes, upcoming appointments, etc.  Non-urgent messages can be sent to your provider as well.   To learn more about what you can do with MyChart, go to [email or web address].    Your next appointment:   6 month(s)  The format for your next appointment:   In Person  Provider:   You may see Fransico Him, MD or one of the following Advanced Practice Providers on your designated Care Team:    Melina Copa, PA-C  Ermalinda Barrios, PA-C    Other Instructions None

## 2019-11-15 ENCOUNTER — Telehealth: Payer: Self-pay | Admitting: *Deleted

## 2019-11-15 DIAGNOSIS — G4733 Obstructive sleep apnea (adult) (pediatric): Secondary | ICD-10-CM

## 2019-11-15 NOTE — Telephone Encounter (Signed)
Patient requesting to speak with Karen Rasmussen in regards to ordering another CPAP machine.

## 2019-11-20 ENCOUNTER — Ambulatory Visit: Payer: Self-pay

## 2019-11-21 ENCOUNTER — Other Ambulatory Visit: Payer: Self-pay

## 2019-11-21 ENCOUNTER — Other Ambulatory Visit: Payer: Medicare Other | Admitting: *Deleted

## 2019-11-21 DIAGNOSIS — I480 Paroxysmal atrial fibrillation: Secondary | ICD-10-CM | POA: Diagnosis not present

## 2019-11-21 DIAGNOSIS — I10 Essential (primary) hypertension: Secondary | ICD-10-CM | POA: Diagnosis not present

## 2019-11-21 DIAGNOSIS — I5032 Chronic diastolic (congestive) heart failure: Secondary | ICD-10-CM | POA: Diagnosis not present

## 2019-11-21 LAB — BASIC METABOLIC PANEL
BUN/Creatinine Ratio: 15 (ref 12–28)
BUN: 16 mg/dL (ref 8–27)
CO2: 25 mmol/L (ref 20–29)
Calcium: 10.1 mg/dL (ref 8.7–10.3)
Chloride: 101 mmol/L (ref 96–106)
Creatinine, Ser: 1.07 mg/dL — ABNORMAL HIGH (ref 0.57–1.00)
GFR calc Af Amer: 60 mL/min/{1.73_m2} (ref >59)
GFR calc non Af Amer: 52 mL/min/{1.73_m2} — ABNORMAL LOW (ref >59)
Glucose: 149 mg/dL — ABNORMAL HIGH (ref 65–99)
Potassium: 4.3 mmol/L (ref 3.5–5.2)
Sodium: 142 mmol/L (ref 134–144)

## 2019-11-21 LAB — CBC
Hematocrit: 41.1 % (ref 34.0–46.6)
Hemoglobin: 13.2 g/dL (ref 11.1–15.9)
MCH: 28.3 pg (ref 26.6–33.0)
MCHC: 32.1 g/dL (ref 31.5–35.7)
MCV: 88 fL (ref 79–97)
Platelets: 294 10*3/uL (ref 150–450)
RBC: 4.66 x10E6/uL (ref 3.77–5.28)
RDW: 13.2 % (ref 11.7–15.4)
WBC: 10.1 10*3/uL (ref 3.4–10.8)

## 2019-11-22 NOTE — Telephone Encounter (Signed)
Patient called back to ask for a Rx for autoset 10 for her, min 4 cm H20 , max 18 cm H20 and supplies

## 2019-11-23 ENCOUNTER — Telehealth: Payer: Self-pay | Admitting: *Deleted

## 2019-11-23 MED ORDER — FUROSEMIDE 20 MG PO TABS: ORAL_TABLET | ORAL | 3 refills | Status: DC

## 2019-11-23 NOTE — Telephone Encounter (Signed)
-----   Message from Imogene Burn, PA-C sent at 11/23/2019 10:04 AM EDT ----- Not sure why she didn't get refills. Please send in a Rx for her lasix with refills for a year. thanks ----- Message ----- From: Jeanann Lewandowsky, RMA Sent: 11/22/2019   4:47 PM EDT To: Imogene Burn, PA-C  Pt states she only got the Lasix rx when you done a video visit on 10/5 but it didn't have any refills on it (per instructions) and pt states that she needs her Lasix every day and wanted to know if there was any reason she didn't get refills?

## 2019-11-23 NOTE — Telephone Encounter (Signed)
Refill for pt's Lasix has been [facility] into [facility], per Ermalinda Barrios, PA-C.  Pt called and made aware and was grateful for the quick response.

## 2019-11-24 NOTE — Telephone Encounter (Signed)
Please order AutoSense 10 for her with auto CPAP 4-18cm H2O with heated humidity ad mask of choice and get a dowload in 2 weeks

## 2019-11-26 NOTE — Telephone Encounter (Signed)
Order faxed to [place] via community message.

## 2019-12-04 DIAGNOSIS — I8311 Varicose veins of right lower extremity with inflammation: Secondary | ICD-10-CM | POA: Diagnosis not present

## 2019-12-04 DIAGNOSIS — I872 Venous insufficiency (chronic) (peripheral): Secondary | ICD-10-CM | POA: Diagnosis not present

## 2019-12-04 DIAGNOSIS — L821 Other seborrheic keratosis: Secondary | ICD-10-CM | POA: Diagnosis not present

## 2019-12-04 DIAGNOSIS — I8312 Varicose veins of left lower extremity with inflammation: Secondary | ICD-10-CM | POA: Diagnosis not present

## 2019-12-04 DIAGNOSIS — L218 Other seborrheic dermatitis: Secondary | ICD-10-CM | POA: Diagnosis not present

## 2019-12-05 DIAGNOSIS — E1169 Type 2 diabetes mellitus with other specified complication: Secondary | ICD-10-CM | POA: Diagnosis not present

## 2019-12-05 DIAGNOSIS — M1711 Unilateral primary osteoarthritis, right knee: Secondary | ICD-10-CM | POA: Diagnosis not present

## 2019-12-05 DIAGNOSIS — E7800 Pure hypercholesterolemia, unspecified: Secondary | ICD-10-CM | POA: Diagnosis not present

## 2019-12-05 DIAGNOSIS — M1712 Unilateral primary osteoarthritis, left knee: Secondary | ICD-10-CM | POA: Diagnosis not present

## 2019-12-05 DIAGNOSIS — M160 Bilateral primary osteoarthritis of hip: Secondary | ICD-10-CM | POA: Diagnosis not present

## 2019-12-05 DIAGNOSIS — K219 Gastro-esophageal reflux disease without esophagitis: Secondary | ICD-10-CM | POA: Diagnosis not present

## 2019-12-05 DIAGNOSIS — E119 Type 2 diabetes mellitus without complications: Secondary | ICD-10-CM | POA: Diagnosis not present

## 2019-12-05 DIAGNOSIS — I4891 Unspecified atrial fibrillation: Secondary | ICD-10-CM | POA: Diagnosis not present

## 2019-12-05 DIAGNOSIS — I10 Essential (primary) hypertension: Secondary | ICD-10-CM | POA: Diagnosis not present

## 2019-12-05 DIAGNOSIS — I119 Hypertensive heart disease without heart failure: Secondary | ICD-10-CM | POA: Diagnosis not present

## 2019-12-05 DIAGNOSIS — F329 Major depressive disorder, single episode, unspecified: Secondary | ICD-10-CM | POA: Diagnosis not present

## 2019-12-05 DIAGNOSIS — M810 Age-related osteoporosis without current pathological fracture: Secondary | ICD-10-CM | POA: Diagnosis not present

## 2020-01-18 NOTE — Addendum Note (Signed)
Addended by: Freada Bergeron on: 01/18/2020 10:47 AM   Modules accepted: Orders

## 2020-01-18 NOTE — Telephone Encounter (Signed)
Patient called back to say she was not able to get the auto sense 10 per her dme and she would like the auto sense 11. Order sent to [place] via fax.

## 2020-01-22 NOTE — Addendum Note (Signed)
Addended by: Freada Bergeron on: 01/22/2020 10:32 AM   Modules accepted: Orders

## 2020-01-23 DIAGNOSIS — F329 Major depressive disorder, single episode, unspecified: Secondary | ICD-10-CM | POA: Diagnosis not present

## 2020-01-23 DIAGNOSIS — K219 Gastro-esophageal reflux disease without esophagitis: Secondary | ICD-10-CM | POA: Diagnosis not present

## 2020-01-23 DIAGNOSIS — I4891 Unspecified atrial fibrillation: Secondary | ICD-10-CM | POA: Diagnosis not present

## 2020-01-23 DIAGNOSIS — M1712 Unilateral primary osteoarthritis, left knee: Secondary | ICD-10-CM | POA: Diagnosis not present

## 2020-01-23 DIAGNOSIS — I119 Hypertensive heart disease without heart failure: Secondary | ICD-10-CM | POA: Diagnosis not present

## 2020-01-23 DIAGNOSIS — M160 Bilateral primary osteoarthritis of hip: Secondary | ICD-10-CM | POA: Diagnosis not present

## 2020-01-23 DIAGNOSIS — E7800 Pure hypercholesterolemia, unspecified: Secondary | ICD-10-CM | POA: Diagnosis not present

## 2020-01-23 DIAGNOSIS — M1711 Unilateral primary osteoarthritis, right knee: Secondary | ICD-10-CM | POA: Diagnosis not present

## 2020-01-23 DIAGNOSIS — M8580 Other specified disorders of bone density and structure, unspecified site: Secondary | ICD-10-CM | POA: Diagnosis not present

## 2020-01-23 DIAGNOSIS — E1169 Type 2 diabetes mellitus with other specified complication: Secondary | ICD-10-CM | POA: Diagnosis not present

## 2020-01-23 DIAGNOSIS — I10 Essential (primary) hypertension: Secondary | ICD-10-CM | POA: Diagnosis not present

## 2020-01-23 DIAGNOSIS — M810 Age-related osteoporosis without current pathological fracture: Secondary | ICD-10-CM | POA: Diagnosis not present

## 2020-02-13 ENCOUNTER — Telehealth: Payer: Self-pay | Admitting: Cardiology

## 2020-02-13 MED ORDER — RIVAROXABAN 20 MG PO TABS: 20.0000 mg | ORAL_TABLET | Freq: Every evening | ORAL | 1 refills | Status: DC

## 2020-02-13 NOTE — Telephone Encounter (Signed)
*  STAT* If patient is at the pharmacy, call can be transferred to refill team.   1. Which medications need to be refilled? (please list name of each medication and dose if known) rivaroxaban (XARELTO) 20 MG TABS tablet  2. Which pharmacy/location (including street and city if local pharmacy) is medication to be sent to? [facility] [place] [place] - [address] AT Portal to Registered [facility] Sites  3. Do they need a 30 day or 90 day supply? 90 day supply

## 2020-02-13 NOTE — Telephone Encounter (Signed)
Prescription refill request for Xarelto received.   Indication: Afib Last office visit: 04/09/2019 Weight: 125.6 kg  Age: 75 yo Scr: 1.07, 11/21/2019 CrCl: 94 ml/min   Prescription refill sent.

## 2020-02-27 ENCOUNTER — Encounter: Payer: Self-pay | Admitting: Physician Assistant

## 2020-03-04 NOTE — Addendum Note (Signed)
Addended by: Freada Bergeron on: 03/04/2020 06:15 PM   Modules accepted: Orders

## 2020-03-17 ENCOUNTER — Other Ambulatory Visit: Payer: Self-pay | Admitting: *Deleted

## 2020-03-17 DIAGNOSIS — I4819 Other persistent atrial fibrillation: Secondary | ICD-10-CM

## 2020-03-17 MED ORDER — RIVAROXABAN 20 MG PO TABS: 20.0000 mg | ORAL_TABLET | Freq: Every evening | ORAL | 1 refills | Status: DC

## 2020-03-17 NOTE — Telephone Encounter (Signed)
Xarelto 20mg  refill request received. Pt is 75 years old, weight-125.6kg, Crea-1.07 on 11/21/2019, last seen by [name] on 11/15/2019 via Telemedicine Visit, Diagnosis-Afib, CrCl-94.77ml/min; Dose is appropriate based on dosing criteria. Will send in refill to requested pharmacy.    Refill was previously sent on 02/13/2020; called [facility] and asked if they had received and she stated they had but the pt canceled the original request and is now requesting again. Advised will send as requested.

## 2020-03-25 ENCOUNTER — Other Ambulatory Visit: Payer: Self-pay | Admitting: Cardiology

## 2020-04-02 DIAGNOSIS — I119 Hypertensive heart disease without heart failure: Secondary | ICD-10-CM | POA: Diagnosis not present

## 2020-04-02 DIAGNOSIS — E7800 Pure hypercholesterolemia, unspecified: Secondary | ICD-10-CM | POA: Diagnosis not present

## 2020-04-02 DIAGNOSIS — E1169 Type 2 diabetes mellitus with other specified complication: Secondary | ICD-10-CM | POA: Diagnosis not present

## 2020-04-02 DIAGNOSIS — M160 Bilateral primary osteoarthritis of hip: Secondary | ICD-10-CM | POA: Diagnosis not present

## 2020-04-02 DIAGNOSIS — F5101 Primary insomnia: Secondary | ICD-10-CM | POA: Diagnosis not present

## 2020-04-04 ENCOUNTER — Other Ambulatory Visit (HOSPITAL_COMMUNITY): Payer: Self-pay | Admitting: Cardiology

## 2020-04-11 DIAGNOSIS — I10 Essential (primary) hypertension: Secondary | ICD-10-CM | POA: Diagnosis not present

## 2020-04-11 DIAGNOSIS — E119 Type 2 diabetes mellitus without complications: Secondary | ICD-10-CM | POA: Diagnosis not present

## 2020-04-11 DIAGNOSIS — E7800 Pure hypercholesterolemia, unspecified: Secondary | ICD-10-CM | POA: Diagnosis not present

## 2020-04-11 DIAGNOSIS — Z794 Long term (current) use of insulin: Secondary | ICD-10-CM | POA: Diagnosis not present

## 2020-04-15 ENCOUNTER — Other Ambulatory Visit: Payer: Self-pay | Admitting: Cardiology

## 2020-04-15 MED ORDER — POTASSIUM CHLORIDE CRYS ER 10 MEQ PO TBCR: EXTENDED_RELEASE_TABLET | ORAL | 1 refills | Status: DC

## 2020-05-12 ENCOUNTER — Encounter: Payer: Self-pay | Admitting: Cardiology

## 2020-05-12 ENCOUNTER — Ambulatory Visit: Payer: Medicare Other | Admitting: Cardiology

## 2020-05-13 ENCOUNTER — Other Ambulatory Visit: Payer: Self-pay

## 2020-05-13 ENCOUNTER — Encounter: Payer: Self-pay | Admitting: Cardiology

## 2020-05-13 ENCOUNTER — Ambulatory Visit (INDEPENDENT_AMBULATORY_CARE_PROVIDER_SITE_OTHER): Payer: Medicare Other | Admitting: Cardiology

## 2020-05-13 VITALS — BP 128/76 | HR 63 | Ht 62.0 in | Wt 283.8 lb

## 2020-05-13 DIAGNOSIS — I350 Nonrheumatic aortic (valve) stenosis: Secondary | ICD-10-CM

## 2020-05-13 DIAGNOSIS — I10 Essential (primary) hypertension: Secondary | ICD-10-CM | POA: Diagnosis not present

## 2020-05-13 DIAGNOSIS — I5032 Chronic diastolic (congestive) heart failure: Secondary | ICD-10-CM

## 2020-05-13 DIAGNOSIS — I313 Pericardial effusion (noninflammatory): Secondary | ICD-10-CM | POA: Diagnosis not present

## 2020-05-13 DIAGNOSIS — I4819 Other persistent atrial fibrillation: Secondary | ICD-10-CM | POA: Diagnosis not present

## 2020-05-13 DIAGNOSIS — G4733 Obstructive sleep apnea (adult) (pediatric): Secondary | ICD-10-CM

## 2020-05-13 DIAGNOSIS — I420 Dilated cardiomyopathy: Secondary | ICD-10-CM | POA: Diagnosis not present

## 2020-05-13 DIAGNOSIS — I3139 Other pericardial effusion (noninflammatory): Secondary | ICD-10-CM

## 2020-05-13 MED ORDER — RIVAROXABAN 20 MG PO TABS: 20.0000 mg | ORAL_TABLET | Freq: Every evening | ORAL | 3 refills | Status: DC

## 2020-05-13 NOTE — Addendum Note (Signed)
Addended by: Antonieta Iba on: 05/13/2020 01:46 PM   Modules accepted: Orders

## 2020-05-13 NOTE — Progress Notes (Addendum)
Date:  05/13/2020   ID:  Karen Rasmussen, DOB [birth year], MRN [id number] The patient was identified using 2 identifiers. PCP:  Kathyrn Lass, MD  Cardiologist:  Fransico Him, MD  Electrophysiologist:  None   Chief Complaint:  Follow up CHF, HTN, DCM  History of Present Illness:    Karen Rasmussen is a 75 y.o. female with with a history of normal coronary arteries with calcium score of 0 on coronary CTA in 09/879, chronic diastolic CHF/non-ischemic cardiomyopathy with EF of 45-50%, paroxysmal atrial fibrillation s/p DCCV x2 and now on Sotalol and Xarelto, obstructive sleep apnea on CPAP, hypertension, diabetes mellitus, and morbid obesity.   She is here today for followup and is doing well.  She denies any chest pain or pressure, SOB, DOE, PND, orthopnea, dizziness, palpitations or syncope. She has chronic LE edema which is stable and controlled on diuretics. She is compliant with her meds and is tolerating meds with no SE.    She is doing well with her CPAP device and thinks that she has gotten used to it.  She tolerates the mask and feels the pressure is adequate.  Since going on CPAP she feels rested in the am and has no significant daytime sleepiness.  She denies any significant mouth or nasal dryness or nasal congestion.  She does not think that he snores.     Past Medical History:  Diagnosis Date  . Abdominal pain, left lower quadrant   . Asthmatic bronchitis   . Benign essential HTN 04/26/2016  . Bursitis of hip   . Chronic diastolic CHF (congestive heart failure) ([place])   . DCM (dilated cardiomyopathy) ([place])    EF 45-50%  . GERD (gastroesophageal reflux disease)   . High cholesterol   . History of kidney stones   . Hypersomnia   . Morbid obesity with BMI of 50.0-59.9, adult ([place])   . OSA on CPAP   . Osteoarthritis    "right hip; both knees" (09/21/2016)  . Persistent atrial fibrillation ([place]) 04/26/2016  . Type II diabetes mellitus ([place])   . Vitamin D deficiency disease     Past Surgical History:  Procedure Laterality Date  . ABDOMINAL HYSTERECTOMY    . APPENDECTOMY    . BALLOON DILATION N/A 11/17/2017   Procedure: BALLOON DILATION;  Surgeon: Ronnette Juniper, MD;  Location: [facility];  Service: Gastroenterology;  Laterality: N/A;  . BIOPSY  11/17/2017   Procedure: BIOPSY;  Surgeon: Ronnette Juniper, MD;  Location: [facility] ENDOSCOPY;  Service: Gastroenterology;;  . CARDIAC CATHETERIZATION    . CARDIOVERSION N/A 06/11/2016   Procedure: CARDIOVERSION;  Surgeon: Dorothy Spark, MD;  Location[facility] ENDOSCOPY;  Service: Cardiovascular;  Laterality: N/A;  . CARDIOVERSION N/A 07/16/2016   Procedure: CARDIOVERSION;  Surgeon: Thayer Headings, MD;  Location: [facility] ENDOSCOPY;  Service: Cardiovascular;  Laterality: N/A;  . CARDIOVERSION N/A 09/23/2016   Procedure: CARDIOVERSION;  Surgeon: Sanda Klein, MD;  Location: [facility] ENDOSCOPY;  Service: Cardiovascular;  Laterality: N/A;  . CHOLECYSTECTOMY OPEN    . COLONOSCOPY N/A 11/17/2017   Procedure: COLONOSCOPY;  Surgeon: Ronnette Juniper, MD;  Location: [facility] ENDOSCOPY;  Service: Gastroenterology;  Laterality: N/A;  . cortisone injection to right knee    . costisone injection to left knee  11/03/2017  . CYSTOSCOPY WITH RETROGRADE PYELOGRAM, URETEROSCOPY AND STENT PLACEMENT Left 01/14/2019   Procedure: CYSTOSCOPy  STENT PLACEMENT;  Surgeon: Robley Fries, MD;  Location: [facility] ORS;  Service: Urology;  Laterality: Left;  . CYSTOSCOPY WITH RETROGRADE PYELOGRAM,  URETEROSCOPY AND STENT PLACEMENT Left 03/06/2019   Procedure: CYSTOSCOPY WITH RETROGRADE PYELOGRAM, URETEROSCOPY AND STENT PLACEMENT;  Surgeon: Robley Fries, MD;  Location: [facility];  Service: Urology;  Laterality: Left;  90 MINS  . CYSTOSCOPY WITH STENT PLACEMENT Left 03/07/2019   Procedure: CYSTOSCOPY WITH , URETEROSCOPY/ STENT PLACEMENT;  Surgeon: Ardis Hughs, MD;  Location: [facility];  Service: Urology;  Laterality: Left;  . DILATION AND CURETTAGE OF UTERUS     S/P miscarriage  .  ESOPHAGOGASTRODUODENOSCOPY N/A 11/17/2017   Procedure: ESOPHAGOGASTRODUODENOSCOPY (EGD);  Surgeon: Ronnette Juniper, MD;  Location: [facility];  Service: Gastroenterology;  Laterality: N/A;  . HOLMIUM LASER APPLICATION Left 05/26/4079   Procedure: HOLMIUM LASER APPLICATION;  Surgeon: Robley Fries, MD;  Location: [facility];  Service: Urology;  Laterality: Left;  . NASAL SEPTUM SURGERY    . POLYPECTOMY  11/17/2017   Procedure: POLYPECTOMY;  Surgeon: Ronnette Juniper, MD;  Location: [facility] ENDOSCOPY;  Service: Gastroenterology;;  . TONSILLECTOMY       Current Meds  Medication Sig  . acetaminophen (TYLENOL) 650 MG CR tablet Take 650 mg by mouth 2 (two) times daily as needed for pain.  Marland Kitchen alendronate (FOSAMAX) 70 MG tablet Take 70 mg by mouth every Sunday. Take with a full glass of water on an empty stomach.  . Biotin w/ Vitamins C & E (HAIR SKIN & NAILS GUMMIES PO) Take 2 tablets by mouth daily.   . blood glucose meter kit and supplies Dispense based on patient and insurance preference. Use up to four times daily as directed. (FOR ICD-10 E10.9, E11.9).  . Calcium-Vitamin D-Vitamin K (CALCIUM + D + K PO) Take 1 tablet by mouth daily.  . carvedilol (COREG) 3.125 MG tablet Take 1 tablet (3.125 mg total) by mouth 2 (two) times daily with a meal.  . diphenhydrAMINE-zinc acetate (BENADRYL) cream Apply 1 application topically 3 (three) times daily as needed for itching.  . folic acid (FOLVITE) 448 MCG tablet Take 800 mcg by mouth daily.  . furosemide (LASIX) 20 MG tablet TAKE 1 TABLET (20 MG) DAILY AND THEN 1 TABLET AS NEEDED DAILY FOR SWELLING....  . gabapentin (NEURONTIN) 300 MG capsule Take 300 mg by mouth at bedtime.  Marland Kitchen HYDROcodone-acetaminophen (NORCO) 5-325 MG tablet Take 1 tablet by mouth every 4 (four) hours as needed for moderate pain (kidney stone pain).  . Insulin Glargine (BASAGLAR KWIKPEN [place]) Inject 34 Units into the skin daily.  . Insulin Pen Needle 31G X 5 MM MISC 12 Units by Does not apply route 3  (three) times daily.  . Ipratropium-Albuterol (COMBIVENT RESPIMAT) 20-100 MCG/ACT AERS respimat Inhale 1 puff into the lungs every 6 (six) hours as needed for wheezing or shortness of breath. Use 3 times daily x 5 days, then every 6 hours as needed.  . Misc Natural Products (GLUCOSAMINE CHONDROITIN TRIPLE) TABS Take 2 tablets by mouth daily.   . Multiple Vitamins-Minerals (CENTRUM SILVER) CHEW Chew 2 tablets by mouth daily. soft chews  . pantoprazole (PROTONIX) 40 MG tablet Take 1 tablet (40 mg total) by mouth daily.  . potassium chloride (KLOR-CON) 10 MEQ tablet TAKE 2 TABLETS BY MOUTH TWICE DAILY  . psyllium (METAMUCIL SMOOTH TEXTURE) 28 % packet Take 1 packet by mouth 2 (two) times daily.  . rivaroxaban (XARELTO) 20 MG TABS tablet Take 1 tablet (20 mg total) by mouth every evening.  . simvastatin (ZOCOR) 10 MG tablet Take 1 tablet (10 mg total) by mouth at bedtime.  . sotalol (BETAPACE) 80  MG tablet TAKE 1 TABLET EVERY 12     HOURS  . spironolactone (ALDACTONE) 25 MG tablet TAKE 1/2 TABLET (12.5MG    TOTAL) DAILY  . triamcinolone cream (KENALOG) 0.1 % Apply 1 application topically 2 (two) times daily as needed (itching legs).      Allergies:   No healthtouch food allergies   Social History   Tobacco Use  . Smoking status: Never Smoker  . Smokeless tobacco: Never Used  Vaping Use  . Vaping Use: Never used  Substance Use Topics  . Alcohol use: Not Currently  . Drug use: No     Family Hx: The patient's family history includes CAD in her mother; Cancer in her father; Diabetes in her brother and father; Heart failure in her father; Hypertension in her brother. There is no history of Breast cancer.  ROS:   Please see the history of present illness.      All other systems reviewed and are negative.   Prior CV studies:   The following studies were reviewed today:  2D echo 02/2019 IMPRESSIONS   1. Left ventricular ejection fraction, by visual estimation, is 55 to  60%. The left  ventricle has normal function. There is mildly increased  left ventricular hypertrophy.  2. Elevated left atrial pressure.  3. Left ventricular diastolic parameters are consistent with Grade II  diastolic dysfunction (pseudonormalization).  4. The left ventricle has no regional wall motion abnormalities.  5. Global right ventricle has normal systolic function.The right  ventricular size is normal. No increase in right ventricular wall  thickness.  6. Left atrial size was moderately dilated.  7. Right atrial size was normal.  8. Small pericardial effusion.  9. The pericardial effusion is circumferential.  10. The mitral valve is normal in structure. No evidence of mitral valve  regurgitation.  11. The tricuspid valve is normal in structure.  12. The aortic valve is tricuspid. Aortic valve regurgitation is not  visualized. Mild aortic valve stenosis.  13. The pulmonic valve was grossly normal. Pulmonic valve regurgitation is  not visualized.  14. Mildly elevated pulmonary artery systolic pressure.  15. The inferior vena cava is normal in size with greater than 50%  respiratory variability, suggesting right atrial pressure of 3 mmHg.     Nuclear stress test 4/2018Study Highlights       There was no ST segment deviation noted during stress.  Defect 1: There is a medium defect of moderate severity present in the basal anteroseptal, basal inferior, mid inferior and apical inferior location.  This is a low risk study.   Low risk stress nuclear study with fixed defects in the basal septum and inferior walls (attenuation vs infarct); no ischemia; study not gated due to atrial fibrillation; suggest echo to assess LV function.       Coronary CTA 8/2018FINDINGS: Non-cardiac: See separate report from [facility]. No significant findings on limited lung and soft tissue windows.   Calcium Score: 0   Aortic root 2.8 cm Dilated MPA 31 mm with LPA 28 mm and RPA 28  mm Cannot r/o thrombus in tip of LA appendage but likely poor contrast mixing Delayed appendage images not performed   As study was done to r/o CAD   Coronary Arteries: Right dominant with no anomalies   LM: Normal   LAD:  Normal   D1: Normal   D2: Normal   Circumflex: Normal   OM1: Normal   OM2:  Normal   RCA:  Normal dominant  PDA:  Normal   PLA:  Normal   IMPRESSION: 1) Calcium score 0   2) Normal right dominant coronary arteries   3) Dilated main PA 31 mm compared to aortic root 28 mm   4) Cannot r/o LAA thrombus but more likely poor mixing of contrast   Jenkins Rouge     Electronically Signed   By: Jenkins Rouge M.D.   On: 09/24/2016 15:20    Labs/Other Tests and Data Reviewed:    EKG:  EKG was performed today and demonstrated atrial fibrillation with RVR at 107bpm with LAFB  Recent Labs: 11/21/2019: BUN 16; Creatinine, Ser 1.07; Hemoglobin 13.2; Platelets 294; Potassium 4.3; Sodium 142   Recent Lipid Panel No results found for: CHOL, TRIG, HDL, CHOLHDL, LDLCALC, LDLDIRECT  Wt Readings from Last 3 Encounters:  05/13/20 283 lb 12.8 oz (128.7 kg)  11/13/19 277 lb (125.6 kg)  04/09/19 274 lb (124.3 kg)     Objective:    Vital Signs:  BP 128/76   Pulse 63   Ht 5' 2"  (1.575 m)   Wt 283 lb 12.8 oz (128.7 kg)   SpO2 96%   BMI 51.91 kg/m   GEN: Well nourished, well developed in no acute distress HEENT: Normal NECK: No JVD; No carotid bruits LYMPHATICS: No lymphadenopathy CARDIAC:irregularly irregular, no murmurs, rubs, gallops RESPIRATORY:  Clear to auscultation without rales, wheezing or rhonchi  ABDOMEN: Soft, non-tender, non-distended MUSCULOSKELETAL:  Trace LE edema; No deformity  SKIN: Warm and dry NEUROLOGIC:  Alert and oriented x 3 PSYCHIATRIC:  Normal affect    ASSESSMENT & PLAN:     Dilated cardiomyopathy  -EF 45 to 50% on echo 05/2016 and 55% on echo 02/2019 -coronary calcium score 0 and normal coronaries on coronary CT  0/2774  Chronic diastolic CHF - echo 02/2876 LVEF 55-60% mod LVH G2DD  -she does not appear volume overloaded on exam today -continue on lasix, spironolactone, coreg -SCr stable at 0.850 and K+ 3.5 in march 2022 -repeat BMET today to make sure K+ ok  Essential hypertension  -BP controlled on exam today -continue Carvedilol 3.133m BID and spiro 12.551mdaily  Persistent atrial fibrillation  -s/p DCCV 09/2016  -she was sinus brady at last OV but now is back in afib with slightly elevated HR -denies any bleeding problems on DOAC -continue Xarelto 201maily, Betapace 44m44mD and Carvedilol 3.125mg32m -Hbg normal at 12.6 in March 2022 -will get into afib clinic this week  Aortic stenosis -mild on echo 02/2019  OSA  -The patient is tolerating PAP therapy well without any problems. The PAP download was reviewed today and showed an AHI of 1.6/hr on auto CPAP 4-18 cm H2O with 59% compliance in using more than 4 hours nightly.  The patient has been using and benefiting from PAP use and will continue to benefit from therapy.  -encouraged her to be more compliant with her device  Pericardial effusion -small on echo a year ago -will repeat to make sure this has resolved  Tests Ordered: Orders Placed This Encounter  Procedures  . EKG 12-Lead    Medication Changes: No orders of the defined types were placed in this encounter.   Follow Up:  In Person in 6 month(s) Dr. TurneRadford Paxned, TraciFransico Him 05/13/2020 1:08 PM    [facility]

## 2020-05-13 NOTE — Addendum Note (Signed)
Addended by: Antonieta Iba on: 05/13/2020 01:35 PM   Modules accepted: Orders

## 2020-05-13 NOTE — Patient Instructions (Addendum)
Medication Instructions:  Your physician recommends that you continue on your current medications as directed. Please refer to the Current Medication list given to you today.  *If you need a refill on your cardiac medications before your next appointment, please call your pharmacy*   Lab Work: TODAY: BMET If you have labs (blood work) drawn today and your tests are completely normal, you will receive your results only by: Marland Kitchen MyChart Message (if you have MyChart) OR . A paper copy in the mail If you have any lab test that is abnormal or we need to change your treatment, we will call you to review the results.   Testing/Procedures: Your physician has requested that you have an echocardiogram. Echocardiography is a painless test that uses sound waves to create images of your heart. It provides your doctor with information about the size and shape of your heart and how well your heart's chambers and valves are working. This procedure takes approximately one hour. There are no restrictions for this procedure.  Follow-Up: At [facility], you and your health needs are our priority.  As part of our continuing mission to provide you with exceptional heart care, we have created designated Provider Care Teams.  These Care Teams include your primary Cardiologist (physician) and Advanced Practice Providers (APPs -  Physician Assistants and Nurse Practitioners) who all work together to provide you with the care you need, when you need it.   Your next appointment:   6 month(s)  The format for your next appointment:   In Person  Provider:   You may see Fransico Him, MD or one of the following Advanced Practice Providers on your designated Care Team:    Melina Copa, PA-C  Ermalinda Barrios, PA-C   AFIB CLINIC INFORMATION: Your appointment is scheduled on: 04/13 at 2:30pm. Please arrive 15 minutes early for check-in. The AFib Clinic is located in the [facility] at [facility]. Parking instructions/directions: [address] (off [address]). When you pull in to Entrance C, there is an underground parking garage to your right. The code to enter the garage is 2231. Take the elevators to the first floor. Follow the signs to the Heart and Vascular [facility]ty Clinics. You will see registration at the end of the hallway.  Phone number: [phone]

## 2020-05-14 LAB — BASIC METABOLIC PANEL
BUN/Creatinine Ratio: 16 (ref 12–28)
BUN: 16 mg/dL (ref 8–27)
CO2: 23 mmol/L (ref 20–29)
Calcium: 10 mg/dL (ref 8.7–10.3)
Chloride: 104 mmol/L (ref 96–106)
Creatinine, Ser: 0.97 mg/dL (ref 0.57–1.00)
Glucose: 122 mg/dL — ABNORMAL HIGH (ref 65–99)
Potassium: 4.7 mmol/L (ref 3.5–5.2)
Sodium: 143 mmol/L (ref 134–144)
eGFR: 62 mL/min/{1.73_m2} (ref >59)

## 2020-05-20 ENCOUNTER — Encounter (HOSPITAL_COMMUNITY): Payer: Self-pay

## 2020-05-21 ENCOUNTER — Ambulatory Visit (HOSPITAL_COMMUNITY)
Admission: RE | Admit: 2020-05-21 | Discharge: 2020-05-21 | Disposition: A | Discharge status: Home / Self Care | Payer: Medicare Other | Source: Ambulatory Visit | Attending: Physician Assistant | Admitting: Physician Assistant

## 2020-05-21 ENCOUNTER — Other Ambulatory Visit: Payer: Self-pay

## 2020-05-21 ENCOUNTER — Encounter (HOSPITAL_COMMUNITY): Payer: Self-pay | Admitting: Physician Assistant

## 2020-05-21 VITALS — BP 102/68 | HR 94 | Ht 62.0 in | Wt 283.7 lb

## 2020-05-21 DIAGNOSIS — Z6841 Body Mass Index (BMI) 40.0 and over, adult: Secondary | ICD-10-CM | POA: Insufficient documentation

## 2020-05-21 DIAGNOSIS — Z7983 Long term (current) use of bisphosphonates: Secondary | ICD-10-CM | POA: Diagnosis not present

## 2020-05-21 DIAGNOSIS — I110 Hypertensive heart disease with heart failure: Secondary | ICD-10-CM | POA: Insufficient documentation

## 2020-05-21 DIAGNOSIS — Z7182 Exercise counseling: Secondary | ICD-10-CM | POA: Diagnosis not present

## 2020-05-21 DIAGNOSIS — D6869 Other thrombophilia: Secondary | ICD-10-CM | POA: Insufficient documentation

## 2020-05-21 DIAGNOSIS — I5032 Chronic diastolic (congestive) heart failure: Secondary | ICD-10-CM | POA: Diagnosis not present

## 2020-05-21 DIAGNOSIS — E669 Obesity, unspecified: Secondary | ICD-10-CM | POA: Insufficient documentation

## 2020-05-21 DIAGNOSIS — Z79899 Other long term (current) drug therapy: Secondary | ICD-10-CM | POA: Diagnosis not present

## 2020-05-21 DIAGNOSIS — I4819 Other persistent atrial fibrillation: Secondary | ICD-10-CM | POA: Insufficient documentation

## 2020-05-21 DIAGNOSIS — G4733 Obstructive sleep apnea (adult) (pediatric): Secondary | ICD-10-CM | POA: Insufficient documentation

## 2020-05-21 DIAGNOSIS — Z794 Long term (current) use of insulin: Secondary | ICD-10-CM | POA: Diagnosis not present

## 2020-05-21 DIAGNOSIS — E119 Type 2 diabetes mellitus without complications: Secondary | ICD-10-CM | POA: Diagnosis not present

## 2020-05-21 DIAGNOSIS — Z8249 Family history of ischemic heart disease and other diseases of the circulatory system: Secondary | ICD-10-CM | POA: Insufficient documentation

## 2020-05-21 DIAGNOSIS — Z7901 Long term (current) use of anticoagulants: Secondary | ICD-10-CM | POA: Diagnosis not present

## 2020-05-21 LAB — BASIC METABOLIC PANEL
Anion gap: 9 (ref 5–15)
BUN: 13 mg/dL (ref 8–23)
CO2: 30 mmol/L (ref 22–32)
Calcium: 9.5 mg/dL (ref 8.9–10.3)
Chloride: 101 mmol/L (ref 98–111)
Creatinine, Ser: 1.08 mg/dL — ABNORMAL HIGH (ref 0.44–1.00)
GFR, Estimated: 55 mL/min — ABNORMAL LOW (ref >60)
Glucose, Bld: 105 mg/dL — ABNORMAL HIGH (ref 70–99)
Potassium: 4.1 mmol/L (ref 3.5–5.1)
Sodium: 140 mmol/L (ref 135–145)

## 2020-05-21 LAB — CBC
HCT: 44.6 % (ref 36.0–46.0)
Hemoglobin: 13.7 g/dL (ref 12.0–15.0)
MCH: 27.8 pg (ref 26.0–34.0)
MCHC: 30.7 g/dL (ref 30.0–36.0)
MCV: 90.7 fL (ref 80.0–100.0)
Platelets: 265 10*3/uL (ref 150–400)
RBC: 4.92 MIL/uL (ref 3.87–5.11)
RDW: 14.1 % (ref 11.5–15.5)
WBC: 7.6 10*3/uL (ref 4.0–10.5)
nRBC: 0 % (ref 0.0–0.2)

## 2020-05-21 LAB — MAGNESIUM: Magnesium: 2.2 mg/dL (ref 1.7–2.4)

## 2020-05-21 NOTE — H&P (View-Only) (Signed)
Primary Care Physician: Kathyrn Lass, MD Primary Cardiologist: Dr Radford Pax Primary Electrophysiologist: Dr Caryl Comes (remotely) Referring Physician: Dr Genevie Ann is a 75 y.o. female with a history of HTN, DM, chronic diastolic CHF, atrial fibrillation who presents for follow up in the [facility]. Seen previously by [name] in 2018. She has been maintained on sotalol since 2018. Patient is on Xarelto for a CHADS2VASC score of 5. She was seen by Dr Radford Pax on 05/13/20 and was found to be back in afib. She admits she had stayed up late for a few nights and had not used her CPAP. She has noticed increased fatigue since then. She denies any bleeding issues on anticoagulation.   Today, she denies symptoms of palpitations, chest pain, shortness of breath, orthopnea, PND, lower extremity edema, dizziness, presyncope, syncope, bleeding, or neurologic sequela. The patient is tolerating medications without difficulties and is otherwise without complaint today.    Atrial Fibrillation Risk Factors:  she does have symptoms or diagnosis of sleep apnea. she is compliant with CPAP therapy. she does not have a history of rheumatic fever.   she has a BMI of Body mass index is 51.9 kg/m.Marland Kitchen Filed Weights   05/21/20 1422  Weight: 128.7 kg    Family History  Problem Relation Age of Onset  . Cancer Father   . Heart failure Father   . Diabetes Father   . CAD Mother   . Diabetes Brother   . Hypertension Brother   . Breast cancer Neg Hx      Atrial Fibrillation Management history:  Previous antiarrhythmic drugs: sotalol  Previous cardioversions: 2018 x 3 Previous ablations: none CHADS2VASC score: 5 Anticoagulation history: Xarelto    Past Medical History:  Diagnosis Date  . Abdominal pain, left lower quadrant   . Asthmatic bronchitis   . Benign essential HTN 04/26/2016  . Bursitis of hip   . Chronic diastolic CHF (congestive heart failure) ([place])   .  DCM (dilated cardiomyopathy) ([place])    EF 45-50%  . GERD (gastroesophageal reflux disease)   . High cholesterol   . History of kidney stones   . Hypersomnia   . Morbid obesity with BMI of 50.0-59.9, adult ([place])   . OSA on CPAP   . Osteoarthritis    "right hip; both knees" (09/21/2016)  . Persistent atrial fibrillation ([place]) 04/26/2016  . Type II diabetes mellitus ([place])   . Vitamin D deficiency disease    Past Surgical History:  Procedure Laterality Date  . ABDOMINAL HYSTERECTOMY    . APPENDECTOMY    . BALLOON DILATION N/A 11/17/2017   Procedure: BALLOON DILATION;  Surgeon: Ronnette Juniper, MD;  Location: [facility];  Service: Gastroenterology;  Laterality: N/A;  . BIOPSY  11/17/2017   Procedure: BIOPSY;  Surgeon: Ronnette Juniper, MD;  Location: [facility] ENDOSCOPY;  Service: Gastroenterology;;  . CARDIAC CATHETERIZATION    . CARDIOVERSION N/A 06/11/2016   Procedure: CARDIOVERSION;  Surgeon: Dorothy Spark, MD;  Location: [facility] ENDOSCOPY;  Service: Cardiovascular;  Laterality: N/A;  . CARDIOVERSION N/A 07/16/2016   Procedure: CARDIOVERSION;  Surgeon: Thayer Headings, MD;  Location: [facility] ENDOSCOPY;  Service: Cardiovascular;  Laterality: N/A;  . CARDIOVERSION N/A 09/23/2016   Procedure: CARDIOVERSION;  Surgeon: Sanda Klein, MD;  Location: [facility] ENDOSCOPY;  Service: Cardiovascular;  Laterality: N/A;  . CHOLECYSTECTOMY OPEN    . COLONOSCOPY N/A 11/17/2017   Procedure: COLONOSCOPY;  Surgeon: Ronnette Juniper, MD;  Location: [facility] ENDOSCOPY;  Service: Gastroenterology;  Laterality: N/A;  . cortisone injection to right knee    . costisone injection to left knee  11/03/2017  . CYSTOSCOPY WITH RETROGRADE PYELOGRAM, URETEROSCOPY AND STENT PLACEMENT Left 01/14/2019   Procedure: CYSTOSCOPy  STENT PLACEMENT;  Surgeon: Robley Fries, MD;  Location: [facility] ORS;  Service: Urology;  Laterality: Left;  . CYSTOSCOPY WITH RETROGRADE PYELOGRAM, URETEROSCOPY AND STENT PLACEMENT Left 03/06/2019   Procedure: CYSTOSCOPY WITH  RETROGRADE PYELOGRAM, URETEROSCOPY AND STENT PLACEMENT;  Surgeon: Robley Fries, MD;  Location: [facility] OR[facility];  Service: Urology;  Laterality: Left;  90 MINS  . CYSTOSCOPY WITH STENT PLACEMENT Left 03/07/2019   Procedure: CYSTOSCOPY WITH , URETEROSCOPY/ STENT PLACEMENT;  Surgeon: Ardis Hughs, MD;  Location: [facility] OR[facility];  Service: Urology;  Laterality: Left;  . DILATION AND CURETTAGE OF UTERUS     S/P miscarriage  . ESOPHAGOGASTRODUODENOSCOPY N/A 11/17/2017   Procedure: ESOPHAGOGASTRODUODENOSCOPY (EGD);  Surgeon: Ronnette Juniper, MD;  Location: [facility];  Service: Gastroenterology;  Laterality: N/A;  . HOLMIUM LASER APPLICATION Left 07/31/6331   Procedure: HOLMIUM LASER APPLICATION;  Surgeon: Robley Fries, MD;  Location: [facility] OR[facility];  Service: Urology;  Laterality: Left;  . NASAL SEPTUM SURGERY    . POLYPECTOMY  11/17/2017   Procedure: POLYPECTOMY;  Surgeon: Ronnette Juniper, MD;  Location: [facility];  Service: Gastroenterology;;  . TONSILLECTOMY      Current Outpatient Medications  Medication Sig Dispense Refill  . acetaminophen (TYLENOL) 650 MG CR tablet Take 650 mg by mouth 2 (two) times daily as needed for pain.    Marland Kitchen alendronate (FOSAMAX) 70 MG tablet Take 70 mg by mouth every Sunday. Take with a full glass of water on an empty stomach.    . Biotin w/ Vitamins C & E (HAIR SKIN & NAILS GUMMIES PO) Take 2 tablets by mouth daily.     . blood glucose meter kit and supplies Dispense based on patient and insurance preference. Use up to four times daily as directed. (FOR ICD-10 E10.9, E11.9). 1 each 0  . Calcium-Vitamin D-Vitamin K (CALCIUM + D + K PO) Take 1 tablet by mouth daily.    . carvedilol (COREG) 3.125 MG tablet Take 1 tablet (3.125 mg total) by mouth 2 (two) times daily with a meal. 180 tablet 0  . diphenhydrAMINE-zinc acetate (BENADRYL) cream Apply 1 application topically 3 (three) times daily as needed for itching.    . folic acid (FOLVITE) 545 MCG tablet Take 800 mcg by mouth daily.     . furosemide (LASIX) 20 MG tablet TAKE 1 TABLET (20 MG) DAILY AND THEN 1 TABLET AS NEEDED DAILY FOR SWELLING.... 180 tablet 3  . gabapentin (NEURONTIN) 300 MG capsule Take 300 mg by mouth at bedtime.    Marland Kitchen HYDROcodone-acetaminophen (NORCO) 5-325 MG tablet Take 1 tablet by mouth every 4 (four) hours as needed for moderate pain (kidney stone pain). 20 tablet 0  . Insulin Glargine (BASAGLAR KWIKPEN [place]) Inject 34 Units into the skin daily.    . Insulin Pen Needle 31G X 5 MM MISC 12 Units by Does not apply route 3 (three) times daily. 100 each 0  . Ipratropium-Albuterol (COMBIVENT RESPIMAT) 20-100 MCG/ACT AERS respimat Inhale 1 puff into the lungs every 6 (six) hours as needed for wheezing or shortness of breath. Use 3 times daily x 5 days, then every 6 hours as needed. 4 g 1  . Misc Natural Products (GLUCOSAMINE CHONDROITIN TRIPLE) TABS Take 2 tablets by mouth daily.     . Multiple Vitamins-Minerals (CENTRUM  SILVER) CHEW Chew 2 tablets by mouth daily. soft chews    . pantoprazole (PROTONIX) 40 MG tablet Take 1 tablet (40 mg total) by mouth daily. 90 tablet 1  . potassium chloride (KLOR-CON) 10 MEQ tablet TAKE 2 TABLETS BY MOUTH TWICE DAILY 360 tablet 1  . psyllium (METAMUCIL SMOOTH TEXTURE) 28 % packet Take 1 packet by mouth 2 (two) times daily.    . rivaroxaban (XARELTO) 20 MG TABS tablet Take 1 tablet (20 mg total) by mouth every evening. 90 tablet 3  . simvastatin (ZOCOR) 10 MG tablet Take 1 tablet (10 mg total) by mouth at bedtime. 90 tablet 1  . sotalol (BETAPACE) 80 MG tablet TAKE 1 TABLET EVERY 12     HOURS 180 tablet 2  . spironolactone (ALDACTONE) 25 MG tablet TAKE 1/2 TABLET (12.5MG    TOTAL) DAILY 45 tablet 2  . triamcinolone cream (KENALOG) 0.1 % Apply 1 application topically 2 (two) times daily as needed (itching legs).      No current facility-administered medications for this encounter.    Allergies  Allergen Reactions  . Diphenhydramine     Other reaction(s): heart issues  . No  [employer] Allergies     Defibrillator pads cause rash.    Social History   Socioeconomic History  . Marital status: Unknown    Spouse name: Not on file  . Number of children: Not on file  . Years of education: Not on file  . Highest education level: Not on file  Occupational History  . Not on file  Tobacco Use  . Smoking status: Never Smoker  . Smokeless tobacco: Never Used  Vaping Use  . Vaping Use: Never used  Substance and Sexual Activity  . Alcohol use: Not Currently  . Drug use: No  . Sexual activity: Never  Other Topics Concern  . Not on file  Social History Narrative   ** Merged History Encounter **       Social Determinants of Health   Financial Resource Strain: Not on file  Food Insecurity: Not on file  Transportation Needs: Not on file  Physical Activity: Not on file  Stress: Not on file  Social Connections: Not on file  Intimate Partner Violence: Not on file     ROS- All systems are reviewed and negative except as per the HPI above.  Physical Exam: Vitals:   05/21/20 1422  BP: 102/68  Pulse: 94  Weight: 128.7 kg  Height: _0  (1.575 m)    GEN- The patient is a well appearing obese female, alert and oriented x 3 today.   Head- normocephalic, atraumatic Eyes-  Sclera clear, conjunctiva pink Ears- hearing intact Oropharynx- clear Neck- supple  Lungs- Clear to ausculation bilaterally, normal work of breathing Heart- irregular rate and rhythm, no murmurs, rubs or gallops  GI- soft, NT, ND, + BS Extremities- no clubbing, cyanosis, or edema MS- no significant deformity or atrophy Skin- no rash or lesion Psych- euthymic mood, full affect Neuro- strength and sensation are intact  Wt Readings from Last 3 Encounters:  05/21/20 128.7 kg  05/13/20 128.7 kg  11/13/19 125.6 kg    EKG today demonstrates  Afib, slow R wave prog, LAFB Vent. rate 94 BPM PR interval * ms QRS duration 118 ms QT/QTcB 402/502 ms  Echo 02/23/19 demonstrated   1. Left ventricular ejection fraction, by visual estimation, is 55 to  60%. The left ventricle has normal function. There is mildly increased  left ventricular hypertrophy.  2.  Elevated left atrial pressure.  3. Left ventricular diastolic parameters are consistent with Grade II  diastolic dysfunction (pseudonormalization).  4. The left ventricle has no regional wall motion abnormalities.  5. Global right ventricle has normal systolic function.The right  ventricular size is normal. No increase in right ventricular wall  thickness.  6. Left atrial size was moderately dilated.  7. Right atrial size was normal.  8. Small pericardial effusion.  9. The pericardial effusion is circumferential.  10. The mitral valve is normal in structure. No evidence of mitral valve  regurgitation.  11. The tricuspid valve is normal in structure.  12. The aortic valve is tricuspid. Aortic valve regurgitation is not  visualized. Mild aortic valve stenosis.  13. The pulmonic valve was grossly normal. Pulmonic valve regurgitation is  not visualized.  14. Mildly elevated pulmonary artery systolic pressure.  15. The inferior vena cava is normal in size with greater than 50%  respiratory variability, suggesting right atrial pressure of 3 mmHg.   Epic records are reviewed at length today  CHA2DS2-VASc Score = 5  The patient's score is based upon: CHF History: Yes HTN History: Yes Diabetes History: Yes Stroke History: No Vascular Disease History: No Age Score: 1 Gender Score: 1      ASSESSMENT AND PLAN: 1. Persistent Atrial Fibrillation (ICD10:  I48.19) The patient's CHA2DS2-VASc score is 5, indicating a 7.2% annual risk of stroke.   We discussed therapeutic options today. She has done very well on sotalol since 2018. Will arrange for DCCV. Recall she had a severe skin reaction to defibrillator pads. She tolerated the old style paddles with conductive gel. Check bmet/cbc/mag today. Continue  sotalol 80 mg BID Continue Coreg 3.125 mg BID Continue Xarelto 20 mg daily. She denies any missed doses in the last 3 weeks.   2. Secondary Hypercoagulable State (ICD10:  D68.69) The patient is at significant risk for stroke/thromboembolism based upon her CHA2DS2-VASc Score of 5.  Continue Rivaroxaban (Xarelto).   3. Obesity Body mass index is 51.9 kg/m. Lifestyle modification was discussed at length including regular exercise and weight reduction. She is not very active.  4. Obstructive sleep apnea The importance of adequate treatment of sleep apnea was discussed today in order to improve our ability to maintain sinus rhythm long term. Patient reports compliance with CPAP therapy. Followed by Dr Radford Pax.  5. HTN Stable, no changes today.  6. Chronic diastolic CHF No signs or symptoms of fluid overload.   Follow up in the AF clinic one week post DCCV.    [facility] [address] [place], [place] [address] [phone] 05/21/2020 2:31 PM

## 2020-05-21 NOTE — Progress Notes (Signed)
  Primary Care Physician: Miller, Lisa, MD Primary Cardiologist: Dr Turner Primary Electrophysiologist: Dr Klein (remotely) Referring Physician: Dr Turner   Karen Rasmussen is a 74 y.o. female with a history of HTN, DM, chronic diastolic CHF, atrial fibrillation who presents for follow up in the [facility]. Seen previously by Donna Carroll in 2018. She has been maintained on sotalol since 2018. Patient is on Xarelto for a CHADS2VASC score of 5. She was seen by Dr Turner on 05/13/20 and was found to be back in afib. She admits she had stayed up late for a few nights and had not used her CPAP. She has noticed increased fatigue since then. She denies any bleeding issues on anticoagulation.   Today, she denies symptoms of palpitations, chest pain, shortness of breath, orthopnea, PND, lower extremity edema, dizziness, presyncope, syncope, bleeding, or neurologic sequela. The patient is tolerating medications without difficulties and is otherwise without complaint today.    Atrial Fibrillation Risk Factors:  she does have symptoms or diagnosis of sleep apnea. she is compliant with CPAP therapy. she does not have a history of rheumatic fever.   she has a BMI of Body mass index is 51.9 kg/m.. Filed Weights   05/21/20 1422  Weight: 128.7 kg    Family History  Problem Relation Age of Onset  . Cancer Father   . Heart failure Father   . Diabetes Father   . CAD Mother   . Diabetes Brother   . Hypertension Brother   . Breast cancer Neg Hx      Atrial Fibrillation Management history:  Previous antiarrhythmic drugs: sotalol  Previous cardioversions: 2018 x 3 Previous ablations: none CHADS2VASC score: 5 Anticoagulation history: Xarelto    Past Medical History:  Diagnosis Date  . Abdominal pain, left lower quadrant   . Asthmatic bronchitis   . Benign essential HTN 04/26/2016  . Bursitis of hip   . Chronic diastolic CHF (congestive heart failure) (HCC)   .  DCM (dilated cardiomyopathy) (HCC)    EF 45-50%  . GERD (gastroesophageal reflux disease)   . High cholesterol   . History of kidney stones   . Hypersomnia   . Morbid obesity with BMI of 50.0-59.9, adult (HCC)   . OSA on CPAP   . Osteoarthritis    "right hip; both knees" (09/21/2016)  . Persistent atrial fibrillation (HCC) 04/26/2016  . Type II diabetes mellitus (HCC)   . Vitamin D deficiency disease    Past Surgical History:  Procedure Laterality Date  . ABDOMINAL HYSTERECTOMY    . APPENDECTOMY    . BALLOON DILATION N/A 11/17/2017   Procedure: BALLOON DILATION;  Surgeon: Karki, Arya, MD;  Location: [facility];  Service: Gastroenterology;  Laterality: N/A;  . BIOPSY  11/17/2017   Procedure: BIOPSY;  Surgeon: Karki, Arya, MD;  Location: [facility];  Service: Gastroenterology;;  . CARDIAC CATHETERIZATION    . CARDIOVERSION N/A 06/11/2016   Procedure: CARDIOVERSION;  Surgeon: Nelson, Katarina H, MD;  Location: [facility];  Service: Cardiovascular;  Laterality: N/A;  . CARDIOVERSION N/A 07/16/2016   Procedure: CARDIOVERSION;  Surgeon: Nahser, Philip J, MD;  Location: [facility];  Service: Cardiovascular;  Laterality: N/A;  . CARDIOVERSION N/A 09/23/2016   Procedure: CARDIOVERSION;  Surgeon: Croitoru, Mihai, MD;  Location: [facility];  Service: Cardiovascular;  Laterality: N/A;  . CHOLECYSTECTOMY OPEN    . COLONOSCOPY N/A 11/17/2017   Procedure: COLONOSCOPY;  Surgeon: Karki, Arya, MD;  Location: [facility];  Service: Gastroenterology;    Laterality: N/A;  . cortisone injection to right knee    . costisone injection to left knee  11/03/2017  . CYSTOSCOPY WITH RETROGRADE PYELOGRAM, URETEROSCOPY AND STENT PLACEMENT Left 01/14/2019   Procedure: CYSTOSCOPy  STENT PLACEMENT;  Surgeon: Robley Fries, MD;  Location: [facility];  Service: Urology;  Laterality: Left;  . CYSTOSCOPY WITH RETROGRADE PYELOGRAM, URETEROSCOPY AND STENT PLACEMENT Left 03/06/2019   Procedure: CYSTOSCOPY WITH  RETROGRADE PYELOGRAM, URETEROSCOPY AND STENT PLACEMENT;  Surgeon: Robley Fries, MD;  Location: [facility];  Service: Urology;  Laterality: Left;  90 MINS  . CYSTOSCOPY WITH STENT PLACEMENT Left 03/07/2019   Procedure: CYSTOSCOPY WITH , URETEROSCOPY/ STENT PLACEMENT;  Surgeon: Ardis Hughs, MD;  Location: [facility];  Service: Urology;  Laterality: Left;  . DILATION AND CURETTAGE OF UTERUS     S/P miscarriage  . ESOPHAGOGASTRODUODENOSCOPY N/A 11/17/2017   Procedure: ESOPHAGOGASTRODUODENOSCOPY (EGD);  Surgeon: Ronnette Juniper, MD;  Location: [facility];  Service: Gastroenterology;  Laterality: N/A;  . HOLMIUM LASER APPLICATION Left 07/31/6331   Procedure: HOLMIUM LASER APPLICATION;  Surgeon: Robley Fries, MD;  Location: [facility];  Service: Urology;  Laterality: Left;  . NASAL SEPTUM SURGERY    . POLYPECTOMY  11/17/2017   Procedure: POLYPECTOMY;  Surgeon: Ronnette Juniper, MD;  Location: [facility];  Service: Gastroenterology;;  . TONSILLECTOMY      Current Outpatient Medications  Medication Sig Dispense Refill  . acetaminophen (TYLENOL) 650 MG CR tablet Take 650 mg by mouth 2 (two) times daily as needed for pain.    Marland Kitchen alendronate (FOSAMAX) 70 MG tablet Take 70 mg by mouth every Sunday. Take with a full glass of water on an empty stomach.    . Biotin w/ Vitamins C & E (HAIR SKIN & NAILS GUMMIES PO) Take 2 tablets by mouth daily.     . blood glucose meter kit and supplies Dispense based on patient and insurance preference. Use up to four times daily as directed. (FOR ICD-10 E10.9, E11.9). 1 each 0  . Calcium-Vitamin D-Vitamin K (CALCIUM + D + K PO) Take 1 tablet by mouth daily.    . carvedilol (COREG) 3.125 MG tablet Take 1 tablet (3.125 mg total) by mouth 2 (two) times daily with a meal. 180 tablet 0  . diphenhydrAMINE-zinc acetate (BENADRYL) cream Apply 1 application topically 3 (three) times daily as needed for itching.    . folic acid (FOLVITE) 545 MCG tablet Take 800 mcg by mouth daily.     . furosemide (LASIX) 20 MG tablet TAKE 1 TABLET (20 MG) DAILY AND THEN 1 TABLET AS NEEDED DAILY FOR SWELLING.... 180 tablet 3  . gabapentin (NEURONTIN) 300 MG capsule Take 300 mg by mouth at bedtime.    Marland Kitchen HYDROcodone-acetaminophen (NORCO) 5-325 MG tablet Take 1 tablet by mouth every 4 (four) hours as needed for moderate pain (kidney stone pain). 20 tablet 0  . Insulin Glargine (BASAGLAR KWIKPEN [place]) Inject 34 Units into the skin daily.    . Insulin Pen Needle 31G X 5 MM MISC 12 Units by Does not apply route 3 (three) times daily. 100 each 0  . Ipratropium-Albuterol (COMBIVENT RESPIMAT) 20-100 MCG/ACT AERS respimat Inhale 1 puff into the lungs every 6 (six) hours as needed for wheezing or shortness of breath. Use 3 times daily x 5 days, then every 6 hours as needed. 4 g 1  . Misc Natural Products (GLUCOSAMINE CHONDROITIN TRIPLE) TABS Take 2 tablets by mouth daily.     . Multiple Vitamins-Minerals (CENTRUM  SILVER) CHEW Chew 2 tablets by mouth daily. soft chews    . pantoprazole (PROTONIX) 40 MG tablet Take 1 tablet (40 mg total) by mouth daily. 90 tablet 1  . potassium chloride (KLOR-CON) 10 MEQ tablet TAKE 2 TABLETS BY MOUTH TWICE DAILY 360 tablet 1  . psyllium (METAMUCIL SMOOTH TEXTURE) 28 % packet Take 1 packet by mouth 2 (two) times daily.    . rivaroxaban (XARELTO) 20 MG TABS tablet Take 1 tablet (20 mg total) by mouth every evening. 90 tablet 3  . simvastatin (ZOCOR) 10 MG tablet Take 1 tablet (10 mg total) by mouth at bedtime. 90 tablet 1  . sotalol (BETAPACE) 80 MG tablet TAKE 1 TABLET EVERY 12     HOURS 180 tablet 2  . spironolactone (ALDACTONE) 25 MG tablet TAKE 1/2 TABLET (12.5MG    TOTAL) DAILY 45 tablet 2  . triamcinolone cream (KENALOG) 0.1 % Apply 1 application topically 2 (two) times daily as needed (itching legs).      No current facility-administered medications for this encounter.    Allergies  Allergen Reactions  . Diphenhydramine     Other reaction(s): heart issues  . No  [employer] Allergies     Defibrillator pads cause rash.    Social History   Socioeconomic History  . Marital status: Unknown    Spouse name: Not on file  . Number of children: Not on file  . Years of education: Not on file  . Highest education level: Not on file  Occupational History  . Not on file  Tobacco Use  . Smoking status: Never Smoker  . Smokeless tobacco: Never Used  Vaping Use  . Vaping Use: Never used  Substance and Sexual Activity  . Alcohol use: Not Currently  . Drug use: No  . Sexual activity: Never  Other Topics Concern  . Not on file  Social History Narrative   ** Merged History Encounter **       Social Determinants of Health   Financial Resource Strain: Not on file  Food Insecurity: Not on file  Transportation Needs: Not on file  Physical Activity: Not on file  Stress: Not on file  Social Connections: Not on file  Intimate Partner Violence: Not on file     ROS- All systems are reviewed and negative except as per the HPI above.  Physical Exam: Vitals:   05/21/20 1422  BP: 102/68  Pulse: 94  Weight: 128.7 kg  Height: _0  (1.575 m)    GEN- The patient is a well appearing obese female, alert and oriented x 3 today.   Head- normocephalic, atraumatic Eyes-  Sclera clear, conjunctiva pink Ears- hearing intact Oropharynx- clear Neck- supple  Lungs- Clear to ausculation bilaterally, normal work of breathing Heart- irregular rate and rhythm, no murmurs, rubs or gallops  GI- soft, NT, ND, + BS Extremities- no clubbing, cyanosis, or edema MS- no significant deformity or atrophy Skin- no rash or lesion Psych- euthymic mood, full affect Neuro- strength and sensation are intact  Wt Readings from Last 3 Encounters:  05/21/20 128.7 kg  05/13/20 128.7 kg  11/13/19 125.6 kg    EKG today demonstrates  Afib, slow R wave prog, LAFB Vent. rate 94 BPM PR interval * ms QRS duration 118 ms QT/QTcB 402/502 ms  Echo 02/23/19 demonstrated   1. Left ventricular ejection fraction, by visual estimation, is 55 to  60%. The left ventricle has normal function. There is mildly increased  left ventricular hypertrophy.  2.  Elevated left atrial pressure.  3. Left ventricular diastolic parameters are consistent with Grade II  diastolic dysfunction (pseudonormalization).  4. The left ventricle has no regional wall motion abnormalities.  5. Global right ventricle has normal systolic function.The right  ventricular size is normal. No increase in right ventricular wall  thickness.  6. Left atrial size was moderately dilated.  7. Right atrial size was normal.  8. Small pericardial effusion.  9. The pericardial effusion is circumferential.  10. The mitral valve is normal in structure. No evidence of mitral valve  regurgitation.  11. The tricuspid valve is normal in structure.  12. The aortic valve is tricuspid. Aortic valve regurgitation is not  visualized. Mild aortic valve stenosis.  13. The pulmonic valve was grossly normal. Pulmonic valve regurgitation is  not visualized.  14. Mildly elevated pulmonary artery systolic pressure.  15. The inferior vena cava is normal in size with greater than 50%  respiratory variability, suggesting right atrial pressure of 3 mmHg.   Epic records are reviewed at length today  CHA2DS2-VASc Score = 5  The patient's score is based upon: CHF History: Yes HTN History: Yes Diabetes History: Yes Stroke History: No Vascular Disease History: No Age Score: 1 Gender Score: 1      ASSESSMENT AND PLAN: 1. Persistent Atrial Fibrillation (ICD10:  I48.19) The patient's CHA2DS2-VASc score is 5, indicating a 7.2% annual risk of stroke.   We discussed therapeutic options today. She has done very well on sotalol since 2018. Will arrange for DCCV. Recall she had a severe skin reaction to defibrillator pads. She tolerated the old style paddles with conductive gel. Check bmet/cbc/mag today. Continue  sotalol 80 mg BID Continue Coreg 3.125 mg BID Continue Xarelto 20 mg daily. She denies any missed doses in the last 3 weeks.   2. Secondary Hypercoagulable State (ICD10:  D68.69) The patient is at significant risk for stroke/thromboembolism based upon her CHA2DS2-VASc Score of 5.  Continue Rivaroxaban (Xarelto).   3. Obesity Body mass index is 51.9 kg/m. Lifestyle modification was discussed at length including regular exercise and weight reduction. She is not very active.  4. Obstructive sleep apnea The importance of adequate treatment of sleep apnea was discussed today in order to improve our ability to maintain sinus rhythm long term. Patient reports compliance with CPAP therapy. Followed by Dr Radford Pax.  5. HTN Stable, no changes today.  6. Chronic diastolic CHF No signs or symptoms of fluid overload.   Follow up in the AF clinic one week post DCCV.    [facility] [address] [place], [place] [address] [phone] 05/21/2020 2:31 PM

## 2020-05-21 NOTE — Patient Instructions (Signed)
Cardioversion scheduled for Tuesday, April 26th  - Arrive at the [facility] and go to admitting at [facility] not eat or drink anything after midnight the night prior to your procedure.  - Take all your morning medication (except diabetic medications) with a sip of water prior to arrival.  - You will not be able to drive home after your procedure.  - Do NOT miss any doses of your blood thinner - if you should miss a dose please notify our office immediately.  - If you feel as if you go back into normal rhythm prior to scheduled cardioversion, please notify our office immediately. If your procedure is canceled in the cardioversion suite you will be charged a cancellation fee.

## 2020-05-27 ENCOUNTER — Other Ambulatory Visit: Payer: Self-pay | Admitting: Family Medicine

## 2020-05-27 DIAGNOSIS — Z1231 Encounter for screening mammogram for malignant neoplasm of breast: Secondary | ICD-10-CM

## 2020-05-27 DIAGNOSIS — M810 Age-related osteoporosis without current pathological fracture: Secondary | ICD-10-CM

## 2020-05-28 ENCOUNTER — Other Ambulatory Visit: Payer: Medicare Other

## 2020-05-28 ENCOUNTER — Ambulatory Visit: Payer: Medicare Other

## 2020-05-30 ENCOUNTER — Telehealth: Payer: Self-pay | Admitting: Cardiology

## 2020-05-30 ENCOUNTER — Other Ambulatory Visit (HOSPITAL_COMMUNITY)
Admission: RE | Admit: 2020-05-30 | Discharge: 2020-05-30 | Disposition: A | Discharge status: Home / Self Care | Payer: Medicare Other | Source: Ambulatory Visit | Attending: Cardiology | Admitting: Cardiology

## 2020-05-30 DIAGNOSIS — Z20822 Contact with and (suspected) exposure to covid-19: Secondary | ICD-10-CM | POA: Diagnosis not present

## 2020-05-30 DIAGNOSIS — Z01812 Encounter for preprocedural laboratory examination: Secondary | ICD-10-CM | POA: Diagnosis not present

## 2020-05-30 NOTE — Telephone Encounter (Signed)
Spoke with the patient who states that the other day she was told that she should be taking her xarelto with food. She states that she has been trying to do so but she doesn't always eat three meals per day and eats at different times. She states that she has been taking her xarelto at night with all of her other night time medications for years now. Advised the patient to continue taking xarelto with her other medications so that she is taking it at the same time every day and does not miss a dose. Encouraged her to eat a snack when she takes it at night. Patient states that she does usually have some crackers at night.

## 2020-05-30 NOTE — Telephone Encounter (Signed)
Patient called and stated that she is taking Xarelto and wondering if she should still be ok if she takes it at night with her other medications with out food?

## 2020-05-31 LAB — SARS CORONAVIRUS 2 (TAT 6-24 HRS): SARS Coronavirus 2: NEGATIVE

## 2020-06-03 ENCOUNTER — Ambulatory Visit (HOSPITAL_COMMUNITY): Payer: Medicare Other | Admitting: Anesthesiology

## 2020-06-03 ENCOUNTER — Ambulatory Visit (HOSPITAL_COMMUNITY)
Admission: RE | Admit: 2020-06-03 | Discharge: 2020-06-03 | Disposition: A | Discharge status: Home / Self Care | Payer: Medicare Other | Attending: Cardiology | Admitting: Cardiology

## 2020-06-03 ENCOUNTER — Encounter (HOSPITAL_COMMUNITY)
Admission: RE | Disposition: A | Discharge status: Home / Self Care | Payer: Self-pay | Source: Home / Self Care | Attending: Cardiology

## 2020-06-03 ENCOUNTER — Other Ambulatory Visit: Payer: Self-pay

## 2020-06-03 ENCOUNTER — Encounter (HOSPITAL_COMMUNITY): Payer: Self-pay | Admitting: Cardiology

## 2020-06-03 DIAGNOSIS — D6869 Other thrombophilia: Secondary | ICD-10-CM | POA: Diagnosis not present

## 2020-06-03 DIAGNOSIS — E669 Obesity, unspecified: Secondary | ICD-10-CM | POA: Insufficient documentation

## 2020-06-03 DIAGNOSIS — Z6841 Body Mass Index (BMI) 40.0 and over, adult: Secondary | ICD-10-CM | POA: Diagnosis not present

## 2020-06-03 DIAGNOSIS — G4733 Obstructive sleep apnea (adult) (pediatric): Secondary | ICD-10-CM | POA: Insufficient documentation

## 2020-06-03 DIAGNOSIS — Z7901 Long term (current) use of anticoagulants: Secondary | ICD-10-CM | POA: Insufficient documentation

## 2020-06-03 DIAGNOSIS — I110 Hypertensive heart disease with heart failure: Secondary | ICD-10-CM | POA: Insufficient documentation

## 2020-06-03 DIAGNOSIS — Z9989 Dependence on other enabling machines and devices: Secondary | ICD-10-CM | POA: Diagnosis not present

## 2020-06-03 DIAGNOSIS — Z79899 Other long term (current) drug therapy: Secondary | ICD-10-CM | POA: Insufficient documentation

## 2020-06-03 DIAGNOSIS — Z794 Long term (current) use of insulin: Secondary | ICD-10-CM | POA: Insufficient documentation

## 2020-06-03 DIAGNOSIS — I420 Dilated cardiomyopathy: Secondary | ICD-10-CM | POA: Diagnosis not present

## 2020-06-03 DIAGNOSIS — I5032 Chronic diastolic (congestive) heart failure: Secondary | ICD-10-CM | POA: Diagnosis not present

## 2020-06-03 DIAGNOSIS — I4819 Other persistent atrial fibrillation: Secondary | ICD-10-CM | POA: Insufficient documentation

## 2020-06-03 HISTORY — PX: CARDIOVERSION: SHX1299

## 2020-06-03 LAB — POCT I-STAT, CHEM 8
BUN: 17 mg/dL (ref 8–23)
Calcium, Ion: 1.21 mmol/L (ref 1.15–1.40)
Chloride: 102 mmol/L (ref 98–111)
Creatinine, Ser: 0.9 mg/dL (ref 0.44–1.00)
Glucose, Bld: 141 mg/dL — ABNORMAL HIGH (ref 70–99)
HCT: 43 % (ref 36.0–46.0)
Hemoglobin: 14.6 g/dL (ref 12.0–15.0)
Potassium: 4.3 mmol/L (ref 3.5–5.1)
Sodium: 139 mmol/L (ref 135–145)
TCO2: 29 mmol/L (ref 22–32)

## 2020-06-03 SURGERY — CARDIOVERSION
Anesthesia: General

## 2020-06-03 MED ORDER — LIDOCAINE 2% (20 MG/ML) 5 ML SYRINGE
INTRAMUSCULAR | Status: DC | PRN
  Administered 2020-06-03: 25 mg via INTRAVENOUS

## 2020-06-03 MED ORDER — SODIUM CHLORIDE 0.9 % IV SOLN: INTRAVENOUS | Status: DC | PRN

## 2020-06-03 MED ORDER — PROPOFOL 10 MG/ML IV BOLUS
INTRAVENOUS | Status: DC | PRN
  Administered 2020-06-03: 80 mg via INTRAVENOUS

## 2020-06-03 NOTE — Discharge Instructions (Signed)
Electrical Cardioversion What can I expect after the procedure?  Your blood pressure, heart rate, breathing rate, and blood oxygen level will be monitored until you leave the hospital or clinic.  Your heart rhythm will be watched to make sure it does not change.  You may have some redness on the skin where the shocks were given. Follow these instructions at home:  Do not drive for 24 hours if you were given a sedative during your procedure.  Take over-the-counter and prescription medicines only as told by your health care provider.  Ask your health care provider how to check your pulse. Check it often.  Rest for 48 hours after the procedure or as told by your health care provider.  Avoid or limit your caffeine use as told by your health care provider.  Keep all follow-up visits as told by your health care provider. This is important. Contact a health care provider if:  You feel like your heart is beating too quickly or your pulse is not regular.  You have a serious muscle cramp that does not go away. Get help right away if:  You have discomfort in your chest.  You are dizzy or you feel faint.  You have trouble breathing or you are short of breath.  Your speech is slurred.  You have trouble moving an arm or leg on one side of your body.  Your fingers or toes turn cold or blue. Summary  Electrical cardioversion is the delivery of a jolt of electricity to restore a normal rhythm to the heart.  This procedure may be done right away in an emergency or may be a scheduled procedure if the condition is not an emergency.  Generally, this is a safe procedure.  After the procedure, check your pulse often as told by your health care provider. This information is not intended to replace advice given to you by your health care provider. Make sure you discuss any questions you have with your health care provider. Document Revised: 08/28/2018 Document Reviewed: 08/28/2018 Elsevier  Patient Education  [place].

## 2020-06-03 NOTE — Transfer of Care (Signed)
Immediate Anesthesia Transfer of Care Note  Patient: Karen Rasmussen  Procedure(s) Performed: CARDIOVERSION (N/A )  Patient Location: PACU and Endoscopy Unit  Anesthesia Type:General  Level of Consciousness: patient cooperative and responds to stimulation  Airway & Oxygen Therapy: Patient Spontanous Breathing  Post-op Assessment: Report given to RN and Post -op Vital signs reviewed and stable  Post vital signs: Reviewed and stable  Last Vitals:  Vitals Value Taken Time  BP    Temp    Pulse    Resp    SpO2      Last Pain:  Vitals:   06/03/20 1059  TempSrc: Tympanic  PainSc: 0-No pain         Complications: No complications documented.

## 2020-06-03 NOTE — Anesthesia Preprocedure Evaluation (Signed)
Anesthesia Evaluation  Patient identified by MRN, date of birth, ID band Patient awake    Reviewed: Allergy & Precautions, NPO status , Patient's Chart, lab work & pertinent test results  Airway Mallampati: II  TM Distance: >3 FB Neck ROM: Full    Dental  (+) Teeth Intact   Pulmonary    breath sounds clear to auscultation       Cardiovascular hypertension,  Rhythm:Irregular Rate:Normal     Neuro/Psych    GI/Hepatic   Endo/Other  diabetes  Renal/GU      Musculoskeletal   Abdominal   Peds  Hematology   Anesthesia Other Findings   Reproductive/Obstetrics                            Anesthesia Physical Anesthesia Plan  ASA: III  Anesthesia Plan: General   Post-op Pain Management:    Induction: Intravenous  PONV Risk Score and Plan:   Airway Management Planned: Mask  Additional Equipment:   Intra-op Plan:   Post-operative Plan:   Informed Consent: I have reviewed the patients History and Physical, chart, labs and discussed the procedure including the risks, benefits and alternatives for the proposed anesthesia with the patient or authorized representative who has indicated his/her understanding and acceptance.     Plan Discussed with: CRNA and Anesthesiologist  Anesthesia Plan Comments:         Anesthesia Quick Evaluation  

## 2020-06-03 NOTE — Anesthesia Postprocedure Evaluation (Signed)
Anesthesia Post Note  Patient: Karen Rasmussen  Procedure(s) Performed: CARDIOVERSION (N/A )     Patient location during evaluation: Endoscopy Anesthesia Type: General Level of consciousness: awake and alert Pain management: pain level controlled Vital Signs Assessment: post-procedure vital signs reviewed and stable Respiratory status: spontaneous breathing, nonlabored ventilation, respiratory function stable and patient connected to nasal cannula oxygen Cardiovascular status: blood pressure returned to baseline and stable Postop Assessment: no apparent nausea or vomiting Anesthetic complications: no   No complications documented.  Last Vitals:  Vitals:   06/03/20 1220 06/03/20 1234  BP: (!) 103/50 (!) 99/52  Pulse:    Resp:    Temp:    SpO2:      Last Pain:  Vitals:   06/03/20 1234  TempSrc:   PainSc: 0-No pain                 Stassi Fadely COKER

## 2020-06-03 NOTE — Interval H&P Note (Signed)
History and Physical Interval Note:  06/03/2020 11:05 AM  Karen Rasmussen  has presented today for surgery, with the diagnosis of AFIB.  The various methods of treatment have been discussed with the patient and family. After consideration of risks, benefits and other options for treatment, the patient has consented to  Procedure(s): CARDIOVERSION (N/A) as a surgical intervention.  The patient's history has been reviewed, patient examined, no change in status, stable for surgery.  I have reviewed the patient's chart and labs.  Questions were answered to the patient's satisfaction.     Donato Heinz

## 2020-06-03 NOTE — CV Procedure (Signed)
Procedure:   DCCV  Indication:  Symptomatic atrial fibrillation  Procedure Note:  The patient signed informed consent.  They have had had therapeutic anticoagulation with Xarelto greater than 3 weeks.  Anesthesia was administered by Dr. Linna Caprice and Rachael Darby, CRNA.  Adequate airway was maintained throughout and vital followed per protocol.  They were cardioverted x 1 with 200J of biphasic synchronized energy.  Handheld paddles were used given her prior reaction to adhesive pads. They converted to NSR with rate 50s.  There were no apparent complications.  The patient had normal neuro status and respiratory status post procedure with vitals stable as recorded elsewhere.    Follow up:  They will continue on current medical therapy and follow up with cardiology as scheduled.  Oswaldo Milian, MD 06/03/2020 12:12 PM

## 2020-06-04 ENCOUNTER — Encounter (HOSPITAL_COMMUNITY): Payer: Self-pay | Admitting: Cardiology

## 2020-06-10 ENCOUNTER — Encounter (HOSPITAL_COMMUNITY): Payer: Self-pay | Admitting: Physician Assistant

## 2020-06-10 ENCOUNTER — Ambulatory Visit (HOSPITAL_COMMUNITY)
Admission: RE | Admit: 2020-06-10 | Discharge: 2020-06-10 | Disposition: A | Discharge status: Home / Self Care | Payer: Medicare Other | Source: Ambulatory Visit | Attending: Physician Assistant | Admitting: Physician Assistant

## 2020-06-10 ENCOUNTER — Other Ambulatory Visit: Payer: Self-pay

## 2020-06-10 VITALS — BP 136/62 | HR 57 | Ht 62.0 in | Wt 282.6 lb

## 2020-06-10 DIAGNOSIS — Z9989 Dependence on other enabling machines and devices: Secondary | ICD-10-CM | POA: Insufficient documentation

## 2020-06-10 DIAGNOSIS — Z7983 Long term (current) use of bisphosphonates: Secondary | ICD-10-CM | POA: Insufficient documentation

## 2020-06-10 DIAGNOSIS — Z794 Long term (current) use of insulin: Secondary | ICD-10-CM | POA: Diagnosis not present

## 2020-06-10 DIAGNOSIS — E669 Obesity, unspecified: Secondary | ICD-10-CM | POA: Insufficient documentation

## 2020-06-10 DIAGNOSIS — I5032 Chronic diastolic (congestive) heart failure: Secondary | ICD-10-CM | POA: Insufficient documentation

## 2020-06-10 DIAGNOSIS — Z79899 Other long term (current) drug therapy: Secondary | ICD-10-CM | POA: Insufficient documentation

## 2020-06-10 DIAGNOSIS — G4733 Obstructive sleep apnea (adult) (pediatric): Secondary | ICD-10-CM | POA: Insufficient documentation

## 2020-06-10 DIAGNOSIS — Z7901 Long term (current) use of anticoagulants: Secondary | ICD-10-CM | POA: Insufficient documentation

## 2020-06-10 DIAGNOSIS — Z6841 Body Mass Index (BMI) 40.0 and over, adult: Secondary | ICD-10-CM | POA: Diagnosis not present

## 2020-06-10 DIAGNOSIS — I110 Hypertensive heart disease with heart failure: Secondary | ICD-10-CM | POA: Diagnosis not present

## 2020-06-10 DIAGNOSIS — I4819 Other persistent atrial fibrillation: Secondary | ICD-10-CM | POA: Insufficient documentation

## 2020-06-10 DIAGNOSIS — Z7984 Long term (current) use of oral hypoglycemic drugs: Secondary | ICD-10-CM | POA: Diagnosis not present

## 2020-06-10 DIAGNOSIS — E119 Type 2 diabetes mellitus without complications: Secondary | ICD-10-CM | POA: Diagnosis not present

## 2020-06-10 DIAGNOSIS — D6869 Other thrombophilia: Secondary | ICD-10-CM | POA: Diagnosis not present

## 2020-06-10 NOTE — Progress Notes (Signed)
Primary Care Physician: Kathyrn Lass, MD Primary Cardiologist: Dr Radford Pax Primary Electrophysiologist: Dr Caryl Comes (remotely) Referring Physician: Dr Genevie Ann is a 75 y.o. female with a history of HTN, DM, chronic diastolic CHF, atrial fibrillation who presents for follow up in the [facility]. Seen previously by [name] in 2018. She has been maintained on sotalol since 2018. Patient is on Xarelto for a CHADS2VASC score of 5. She was seen by Dr Radford Pax on 05/13/20 and was found to be back in afib. She admits she had stayed up late for a few nights and had not used her CPAP. She has noticed increased fatigue since then.   On follow up today, patient is s/p DCCV 06/03/20. She feels her fatigue has improved since the procedure. She denies any bleeding issues on anticoagulation.    Today, she denies symptoms of palpitations, chest pain, shortness of breath, orthopnea, PND, lower extremity edema, dizziness, presyncope, syncope, bleeding, or neurologic sequela. The patient is tolerating medications without difficulties and is otherwise without complaint today.    Atrial Fibrillation Risk Factors:  she does have symptoms or diagnosis of sleep apnea. she is compliant with CPAP therapy. she does not have a history of rheumatic fever.   she has a BMI of Body mass index is 51.69 kg/m.Marland Kitchen Filed Weights   06/10/20 1512  Weight: 128.2 kg    Family History  Problem Relation Age of Onset  . Cancer Father   . Heart failure Father   . Diabetes Father   . CAD Mother   . Diabetes Brother   . Hypertension Brother   . Breast cancer Neg Hx      Atrial Fibrillation Management history:  Previous antiarrhythmic drugs: sotalol  Previous cardioversions: 2018 x 3, 06/03/20 Previous ablations: none CHADS2VASC score: 5 Anticoagulation history: Xarelto    Past Medical History:  Diagnosis Date  . Abdominal pain, left lower quadrant   . Asthmatic bronchitis    . Benign essential HTN 04/26/2016  . Bursitis of hip   . Chronic diastolic CHF (congestive heart failure) ([place])   . DCM (dilated cardiomyopathy) ([place])    EF 45-50%  . GERD (gastroesophageal reflux disease)   . High cholesterol   . History of kidney stones   . Hypersomnia   . Morbid obesity with BMI of 50.0-59.9, adult ([place])   . OSA on CPAP   . Osteoarthritis    "right hip; both knees" (09/21/2016)  . Persistent atrial fibrillation ([place]) 04/26/2016  . Type II diabetes mellitus ([place])   . Vitamin D deficiency disease    Past Surgical History:  Procedure Laterality Date  . ABDOMINAL HYSTERECTOMY    . APPENDECTOMY    . BALLOON DILATION N/A 11/17/2017   Procedure: BALLOON DILATION;  Surgeon: Ronnette Juniper, MD;  Location: [facility]OS[facility];  Service: Gastroenterology;  Laterality: N/A;  . BIOPSY  11/17/2017   Procedure: BIOPSY;  Surgeon: Ronnette Juniper, MD;  Location: [facility] ENDOSCOPY;  Service: Gastroenterology;;  . CARDIAC CATHETERIZATION    . CARDIOVERSION N/A 06/11/2016   Procedure: CARDIOVERSION;  Surgeon: Dorothy Spark, MD;  Location: [facility] ENDOSCOPY;  Service: Cardiovascular;  Laterality: N/A;  . CARDIOVERSION N/A 07/16/2016   Procedure: CARDIOVERSION;  Surgeon: Thayer Headings, MD;  Location: [facility] ENDOSCOPY;  Service: Cardiovascular;  Laterality: N/A;  . CARDIOVERSION N/A 09/23/2016   Procedure: CARDIOVERSION;  Surgeon: Sanda Klein, MD;  Location: [facility] ENDOSCOPY;  Service: Cardiovascular;  Laterality: N/A;  . CARDIOVERSION N/A 06/03/2020  Procedure: CARDIOVERSION;  Surgeon: Donato Heinz, MD;  Location: [facility] ENDOSCOPY;  Service: Cardiovascular;  Laterality: N/A;  . CHOLECYSTECTOMY OPEN    . COLONOSCOPY N/A 11/17/2017   Procedure: COLONOSCOPY;  Surgeon: Ronnette Juniper, MD;  Location: [facility] ENDOSCOPY;  Service: Gastroenterology;  Laterality: N/A;  . cortisone injection to right knee    . costisone injection to left knee  11/03/2017  . CYSTOSCOPY WITH RETROGRADE PYELOGRAM, URETEROSCOPY AND  STENT PLACEMENT Left 01/14/2019   Procedure: CYSTOSCOPy  STENT PLACEMENT;  Surgeon: Robley Fries, MD;  Location: [facility] ORS;  Service: Urology;  Laterality: Left;  . CYSTOSCOPY WITH RETROGRADE PYELOGRAM, URETEROSCOPY AND STENT PLACEMENT Left 03/06/2019   Procedure: CYSTOSCOPY WITH RETROGRADE PYELOGRAM, URETEROSCOPY AND STENT PLACEMENT;  Surgeon: Robley Fries, MD;  Location: [facility] ORS;  Service: Urology;  Laterality: Left;  90 MINS  . CYSTOSCOPY WITH STENT PLACEMENT Left 03/07/2019   Procedure: CYSTOSCOPY WITH , URETEROSCOPY/ STENT PLACEMENT;  Surgeon: Ardis Hughs, MD;  Location: [facility] ORS;  Service: Urology;  Laterality: Left;  . DILATION AND CURETTAGE OF UTERUS     S/P miscarriage  . ESOPHAGOGASTRODUODENOSCOPY N/A 11/17/2017   Procedure: ESOPHAGOGASTRODUODENOSCOPY (EGD);  Surgeon: Ronnette Juniper, MD;  Location: [facility] ENDOSCOPY;  Service: Gastroenterology;  Laterality: N/A;  . HOLMIUM LASER APPLICATION Left 1/61/0960   Procedure: HOLMIUM LASER APPLICATION;  Surgeon: Robley Fries, MD;  Location: [facility] ORS;  Service: Urology;  Laterality: Left;  . NASAL SEPTUM SURGERY    . POLYPECTOMY  11/17/2017   Procedure: POLYPECTOMY;  Surgeon: Ronnette Juniper, MD;  Location: [facility] ENDOSCOPY;  Service: Gastroenterology;;  . TONSILLECTOMY      Current Outpatient Medications  Medication Sig Dispense Refill  . acetaminophen (TYLENOL) 650 MG CR tablet Take 650 mg by mouth 2 (two) times daily as needed for pain.    Marland Kitchen alendronate (FOSAMAX) 70 MG tablet Take 70 mg by mouth every Sunday. Take with a full glass of water on an empty stomach.    . betamethasone dipropionate 0.05 % cream Apply 1 application topically 2 (two) times daily.    . Biotin w/ Vitamins C & E (HAIR SKIN & NAILS GUMMIES PO) Take 2 tablets by mouth daily.     . blood glucose meter kit and supplies Dispense based on patient and insurance preference. Use up to four times daily as directed. (FOR ICD-10 E10.9, E11.9). 1 each 0  . Calcium-Vitamin D-Vitamin  K (CALCIUM + D + K PO) Take 1 tablet by mouth in the morning.    . carvedilol (COREG) 3.125 MG tablet Take 1 tablet (3.125 mg total) by mouth 2 (two) times daily with a meal. 180 tablet 0  . diphenhydrAMINE-zinc acetate (BENADRYL) cream Apply 1 application topically 3 (three) times daily as needed for itching.    . fluocinolone (SYNALAR) 0.01 % external solution Apply 1 application topically 2 (two) times daily as needed (skin irritation).    . folic acid (FOLVITE) 454 MCG tablet Take 800 mcg by mouth daily.    . furosemide (LASIX) 20 MG tablet TAKE 1 TABLET (20 MG) DAILY AND THEN 1 TABLET AS NEEDED DAILY FOR SWELLING.... 180 tablet 3  . gabapentin (NEURONTIN) 300 MG capsule Take 300 mg by mouth at bedtime.    Marland Kitchen HYDROcodone-acetaminophen (NORCO) 5-325 MG tablet Take 1 tablet by mouth every 4 (four) hours as needed for moderate pain (kidney stone pain). 20 tablet 0  . Insulin Glargine (BASAGLAR KWIKPEN [place]) Inject 34 Units into the skin in the morning.    Marland Kitchen  Insulin Pen Needle 31G X 5 MM MISC 12 Units by Does not apply route 3 (three) times daily. 100 each 0  . Ipratropium-Albuterol (COMBIVENT RESPIMAT) 20-100 MCG/ACT AERS respimat Inhale 1 puff into the lungs every 6 (six) hours as needed for wheezing or shortness of breath. Use 3 times daily x 5 days, then every 6 hours as needed. 4 g 1  . metFORMIN (GLUCOPHAGE) 500 MG tablet Take 500 mg by mouth 2 (two) times daily with a meal.    . Misc Natural Products (GLUCOSAMINE CHONDROITIN TRIPLE) TABS Take 2 tablets by mouth daily.     . Multiple Vitamins-Minerals (CENTRUM SILVER) CHEW Chew 2 tablets by mouth daily. soft chews    . pantoprazole (PROTONIX) 40 MG tablet Take 1 tablet (40 mg total) by mouth daily. 90 tablet 1  . Polyethyl Glycol-Propyl Glycol (SYSTANE) 0.4-0.3 % SOLN Place 1-2 drops into both eyes 3 (three) times daily as needed (dry/irritated eyes).    . potassium chloride (KLOR-CON) 10 MEQ tablet TAKE 2 TABLETS BY MOUTH TWICE DAILY 360 tablet 1   . psyllium (METAMUCIL SMOOTH TEXTURE) 28 % packet Take 1 packet by mouth in the morning.    . rivaroxaban (XARELTO) 20 MG TABS tablet Take 1 tablet (20 mg total) by mouth every evening. 90 tablet 3  . simvastatin (ZOCOR) 10 MG tablet Take 1 tablet (10 mg total) by mouth at bedtime. 90 tablet 1  . sotalol (BETAPACE) 80 MG tablet TAKE 1 TABLET EVERY 12     HOURS (Patient taking differently: TAKE 1 TABLET EVERY 12     HOURS) 180 tablet 2  . spironolactone (ALDACTONE) 25 MG tablet TAKE 1/2 TABLET (12.5MG    TOTAL) DAILY (Patient taking differently: TAKE 1/2 TABLET (12.5MG    TOTAL) DAILY) 45 tablet 2  . triamcinolone cream (KENALOG) 0.1 % Apply 1 application topically 2 (two) times daily as needed (itching legs).      No current facility-administered medications for this encounter.    Allergies  Allergen Reactions  . Diphenhydramine Other (See Comments)    heart issues  . No [employer] Allergies     Defibrillator pads cause rash.    Social History   Socioeconomic History  . Marital status: Unknown    Spouse name: Not on file  . Number of children: Not on file  . Years of education: Not on file  . Highest education level: Not on file  Occupational History  . Not on file  Tobacco Use  . Smoking status: Never Smoker  . Smokeless tobacco: Never Used  Vaping Use  . Vaping Use: Never used  Substance and Sexual Activity  . Alcohol use: Not Currently  . Drug use: No  . Sexual activity: Never  Other Topics Concern  . Not on file  Social History Narrative   ** Merged History Encounter **       Social Determinants of Health   Financial Resource Strain: Not on file  Food Insecurity: Not on file  Transportation Needs: Not on file  Physical Activity: Not on file  Stress: Not on file  Social Connections: Not on file  Intimate Partner Violence: Not on file     ROS- All systems are reviewed and negative except as per the HPI above.  Physical Exam: Vitals:   06/10/20 1512   BP: 136/62  Pulse: (!) 57  Weight: 128.2 kg  Height: 5' 2"  (1.575 m)    GEN- The patient is a well appearing obese female, alert  and oriented x 3 today.   HEENT-head normocephalic, atraumatic, sclera clear, conjunctiva pink, hearing intact, trachea midline. Lungs- Clear to ausculation bilaterally, normal work of breathing Heart- Regular rate and rhythm, no murmurs, rubs or gallops  GI- soft, NT, ND, + BS Extremities- no clubbing, cyanosis, or edema MS- no significant deformity or atrophy Skin- no rash or lesion Psych- euthymic mood, full affect Neuro- strength and sensation are intact   Wt Readings from Last 3 Encounters:  06/10/20 128.2 kg  06/03/20 127.9 kg  05/21/20 128.7 kg    EKG today demonstrates  SB, PAC, LAFB Vent. rate 57 BPM PR interval 160 ms QRS duration 106 ms QT/QTcB 460/447 ms  Echo 02/23/19 demonstrated  1. Left ventricular ejection fraction, by visual estimation, is 55 to  60%. The left ventricle has normal function. There is mildly increased  left ventricular hypertrophy.  2. Elevated left atrial pressure.  3. Left ventricular diastolic parameters are consistent with Grade II  diastolic dysfunction (pseudonormalization).  4. The left ventricle has no regional wall motion abnormalities.  5. Global right ventricle has normal systolic function.The right  ventricular size is normal. No increase in right ventricular wall  thickness.  6. Left atrial size was moderately dilated.  7. Right atrial size was normal.  8. Small pericardial effusion.  9. The pericardial effusion is circumferential.  10. The mitral valve is normal in structure. No evidence of mitral valve  regurgitation.  11. The tricuspid valve is normal in structure.  12. The aortic valve is tricuspid. Aortic valve regurgitation is not  visualized. Mild aortic valve stenosis.  13. The pulmonic valve was grossly normal. Pulmonic valve regurgitation is  not visualized.  14. Mildly  elevated pulmonary artery systolic pressure.  15. The inferior vena cava is normal in size with greater than 50%  respiratory variability, suggesting right atrial pressure of 3 mmHg.   Epic records are reviewed at length today  CHA2DS2-VASc Score = 5  The patient's score is based upon: CHF History: Yes HTN History: Yes Diabetes History: Yes Stroke History: No Vascular Disease History: No Age Score: 1 Gender Score: 1      ASSESSMENT AND PLAN: 1. Persistent Atrial Fibrillation (ICD10:  I48.19) The patient's CHA2DS2-VASc score is 5, indicating a 7.2% annual risk of stroke.   S/p DCCV on 06/03/20 Patient back in SR. Continue sotalol 80 mg BID. QT stable.  Continue Coreg 3.125 mg BID Continue Xarelto 20 mg daily   2. Secondary Hypercoagulable State (ICD10:  D68.69) The patient is at significant risk for stroke/thromboembolism based upon her CHA2DS2-VASc Score of 5.  Continue Rivaroxaban (Xarelto).   3. Obesity Body mass index is 51.69 kg/m. Lifestyle modification was discussed and encouraged including regular physical activity and weight reduction.  4. Obstructive sleep apnea Patient reports compliance with CPAP therapy. Followed by Dr Radford Pax.  5. HTN Stable, no changes today.  6. Chronic diastolic CHF No signs or symptoms of fluid overload.   Follow up in the AF clinic in 6 months.    [facility] [address] [place], [place] [address] [phone] 06/10/2020 3:21 PM

## 2020-06-13 IMAGING — RF DG ESOPHAGUS
3 series · 14 of 14 positions shown · non-contrast
Comparison: Chest radiograph 01/15/2019

CLINICAL DATA: Patient reports history of dysphagia. Pain within
the central chest.

EXAM:
ESOPHOGRAM / BARIUM SWALLOW / BARIUM TABLET STUDY
TECHNIQUE: Combined double contrast and single contrast examination performed
using effervescent crystals, thick barium liquid, and thin barium
liquid. The patient was observed with fluoroscopy swallowing a 13 mm
barium sulphate tablet.
FLUOROSCOPY TIME:  Fluoroscopy Time:  4 minutes, 24 seconds
Radiation Exposure Index (if provided by the fluoroscopic device):
269 mg

[Series 1: sequence · 4 of 28 frames shown (1 of 2)]
[frame 5/28]
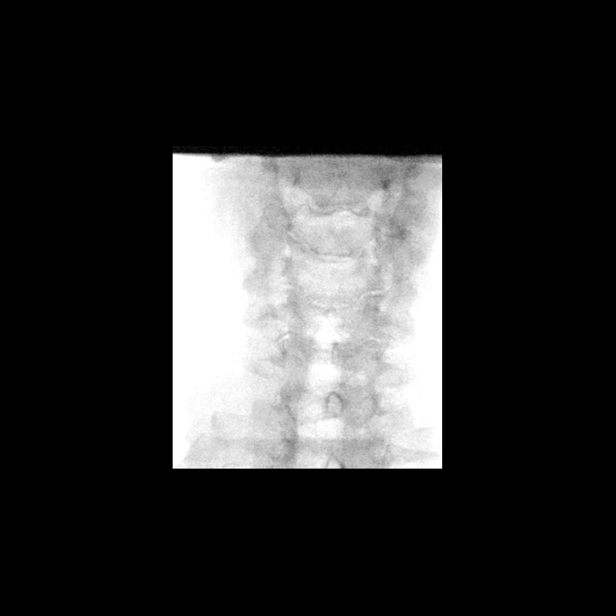
[frame 15/28]
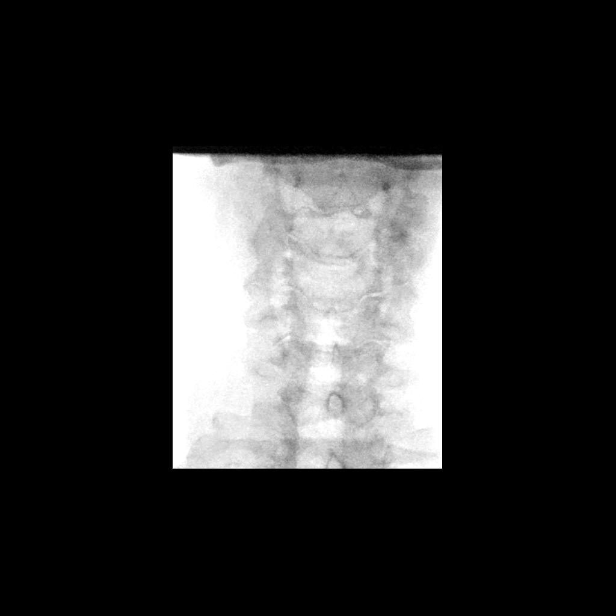
[frame 22/28]
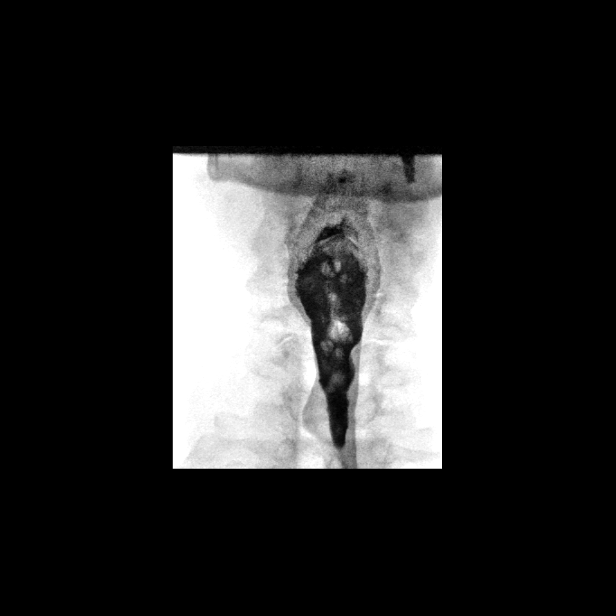
[frame 24/28]
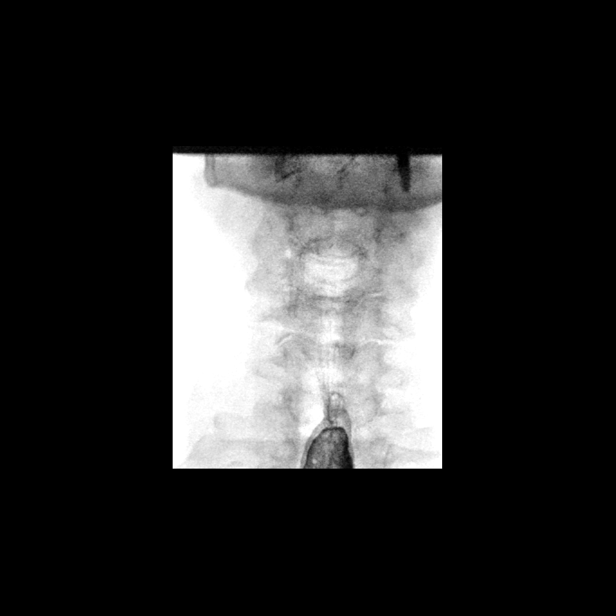

[Series 2: sequence · 4 of 11 frames shown (2 of 2)]
[frame 2/11]
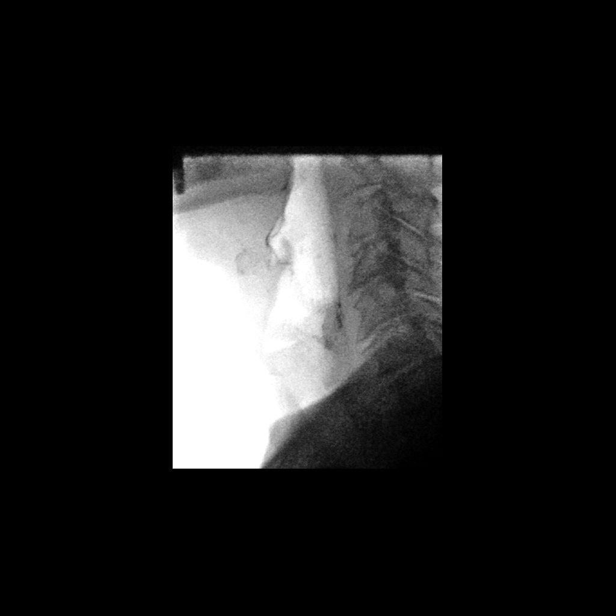
[frame 6/11]
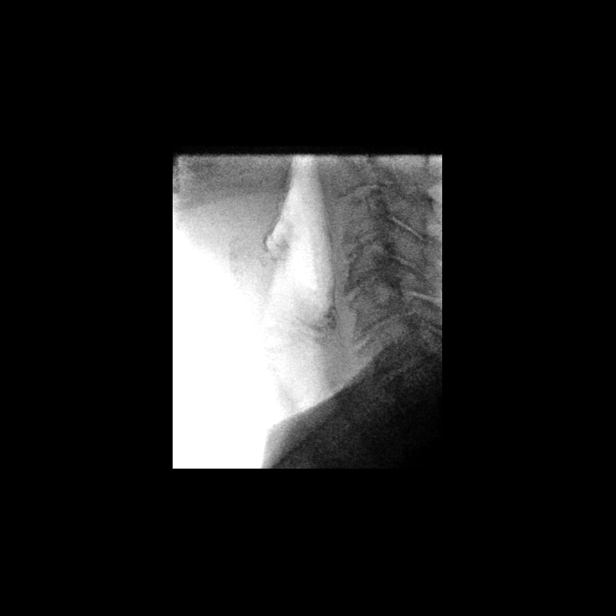
[frame 9/11]
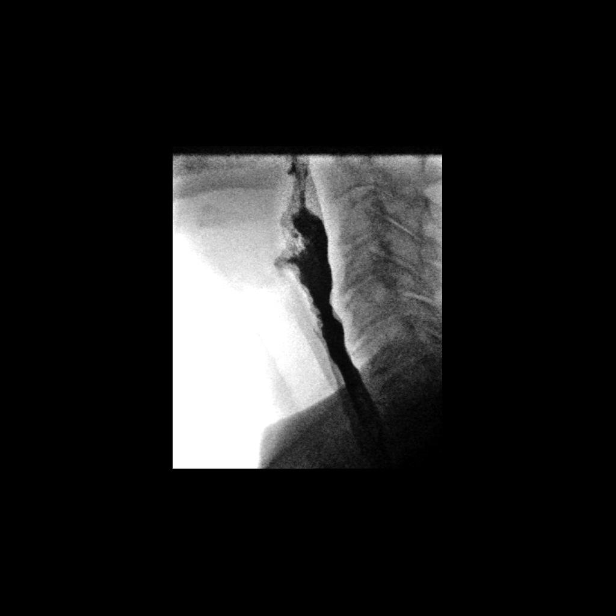
[frame 10/11]
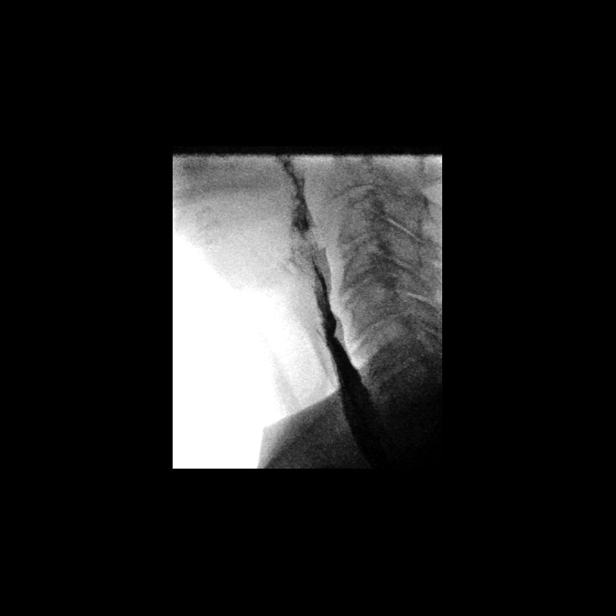

[Series 3: one shot · 0.15mm/px · 6 of 6 slices shown]
[im 1/6]
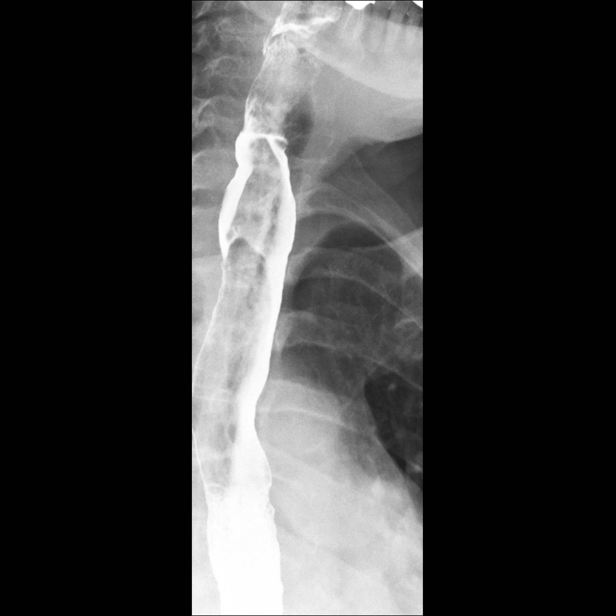
[im 2/6]
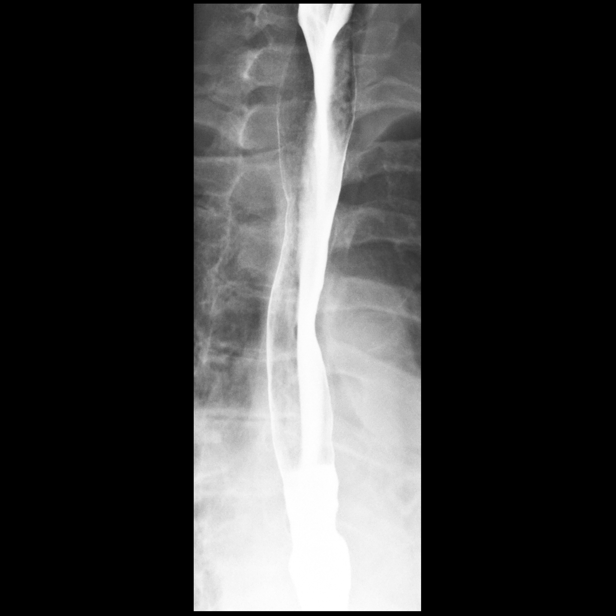
[im 3/6]
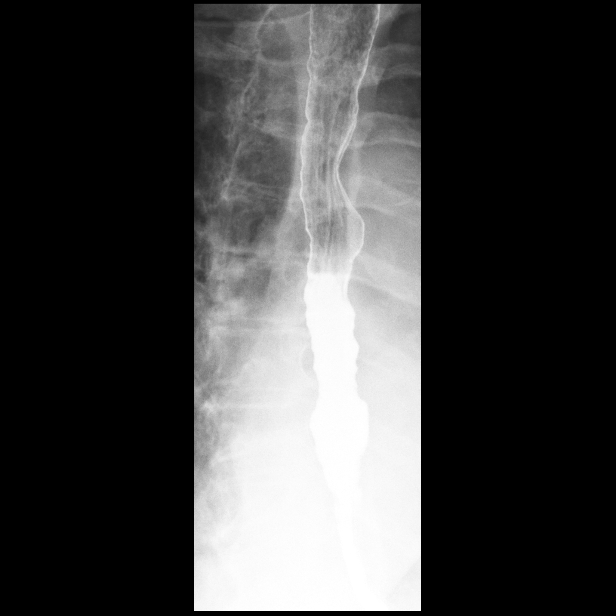
[im 4/6]
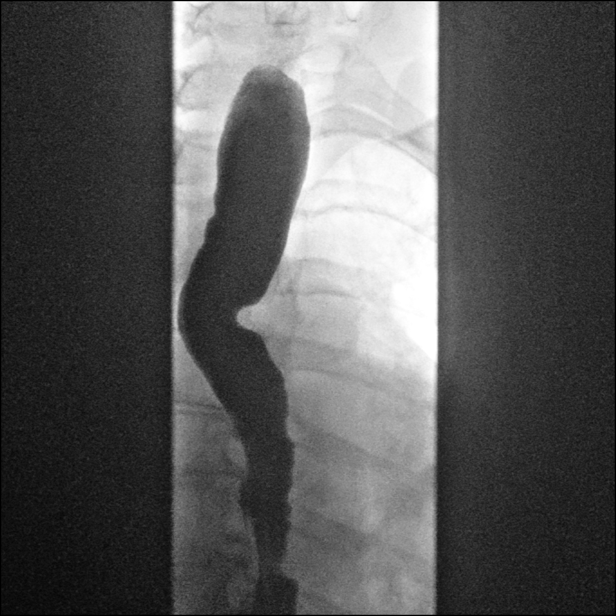
[im 5/6]
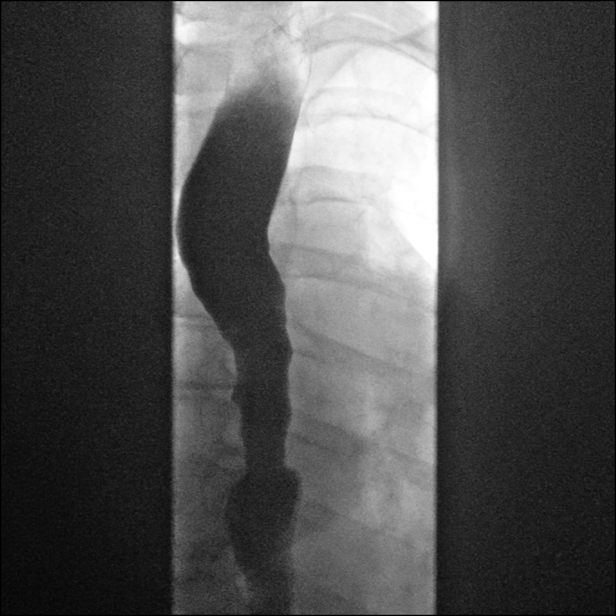
[im 6/6]
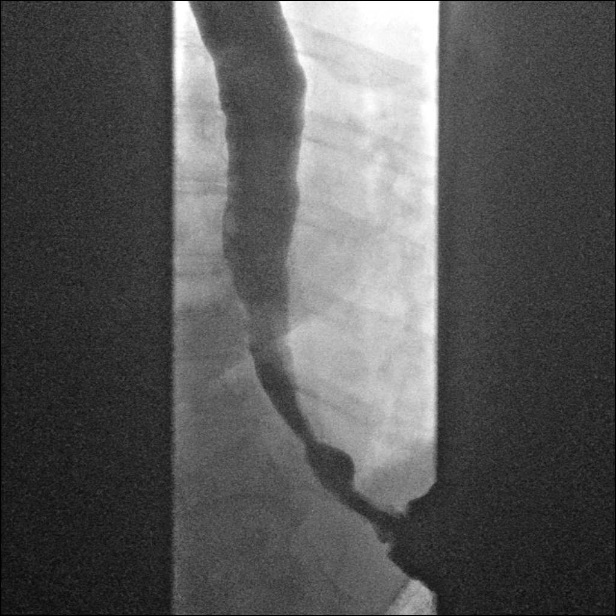

[14 of 14 positions shown; findings below may reference images not displayed]

FINDINGS: Normal pharyngeal phase. Contrast flows freely throughout the
esophagus without evidence of stricture or mass lesion. Marked
esophageal dysmotility with intra esophageal reflux. No evidence for
spontaneous or inducible gastroesophageal reflux. The barium tablet
passed appropriately into the stomach. Small hiatal hernia.
IMPRESSION: Marked esophageal dysmotility.

Small hiatal hernia.

## 2020-06-18 ENCOUNTER — Other Ambulatory Visit (HOSPITAL_COMMUNITY): Payer: Medicare Other

## 2020-06-23 ENCOUNTER — Other Ambulatory Visit: Payer: Self-pay | Admitting: Cardiology

## 2020-06-24 DIAGNOSIS — E119 Type 2 diabetes mellitus without complications: Secondary | ICD-10-CM | POA: Diagnosis not present

## 2020-06-27 NOTE — Telephone Encounter (Addendum)
Patient has a 10 week follow up appointment scheduled for 08/29/20. Patient understands he needs to keep this appointment for insurance compliance. Patient was grateful for the call and thanked me.

## 2020-07-04 DIAGNOSIS — L290 Pruritus ani: Secondary | ICD-10-CM | POA: Diagnosis not present

## 2020-07-04 DIAGNOSIS — R238 Other skin changes: Secondary | ICD-10-CM | POA: Diagnosis not present

## 2020-07-04 DIAGNOSIS — R202 Paresthesia of skin: Secondary | ICD-10-CM | POA: Diagnosis not present

## 2020-07-15 DIAGNOSIS — H18413 Arcus senilis, bilateral: Secondary | ICD-10-CM | POA: Diagnosis not present

## 2020-07-15 DIAGNOSIS — H25013 Cortical age-related cataract, bilateral: Secondary | ICD-10-CM | POA: Diagnosis not present

## 2020-07-15 DIAGNOSIS — H25043 Posterior subcapsular polar age-related cataract, bilateral: Secondary | ICD-10-CM | POA: Diagnosis not present

## 2020-07-15 DIAGNOSIS — H2513 Age-related nuclear cataract, bilateral: Secondary | ICD-10-CM | POA: Diagnosis not present

## 2020-07-15 DIAGNOSIS — H2511 Age-related nuclear cataract, right eye: Secondary | ICD-10-CM | POA: Diagnosis not present

## 2020-07-17 ENCOUNTER — Other Ambulatory Visit: Payer: Self-pay | Admitting: Cardiology

## 2020-07-17 IMAGING — MG DIGITAL SCREENING BILAT W/ TOMO W/ CAD
6 of 10 series · 6 of 30 positions shown · non-contrast
Comparison: Previous exam(s).

CLINICAL DATA: Screening.

EXAM:
DIGITAL SCREENING BILATERAL MAMMOGRAM WITH TOMO AND CAD

[L CC synth-2D]
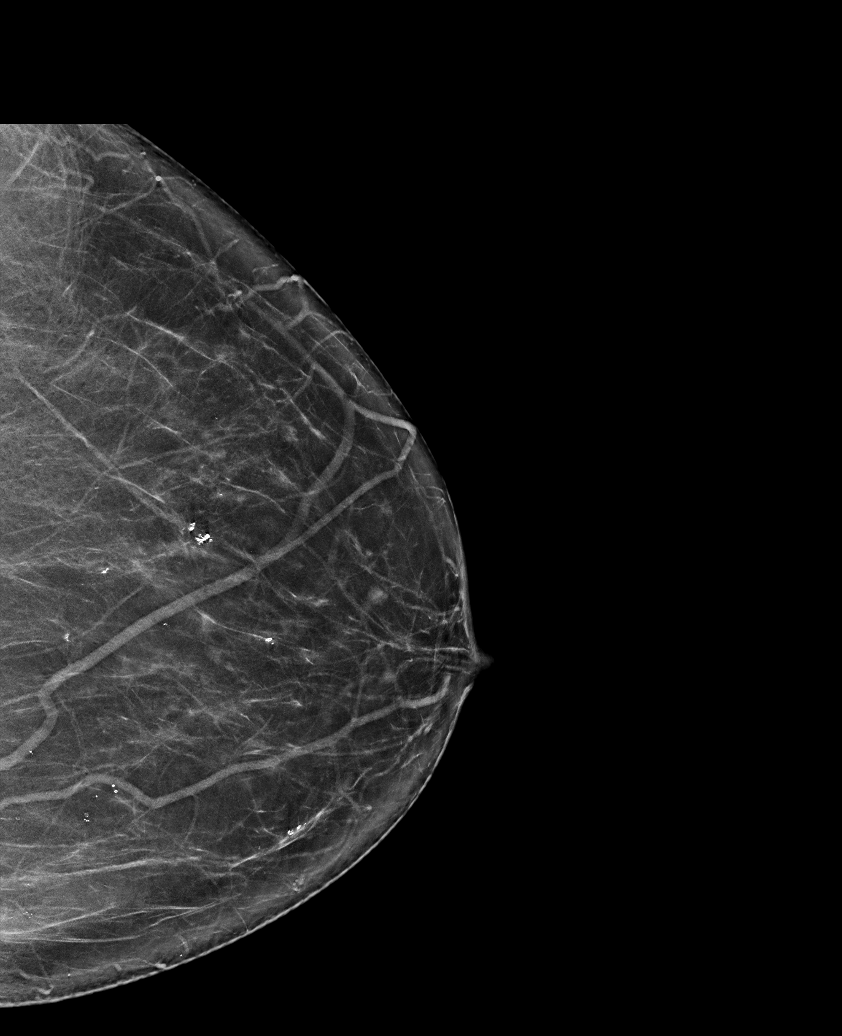

[L MLO synth-2D]
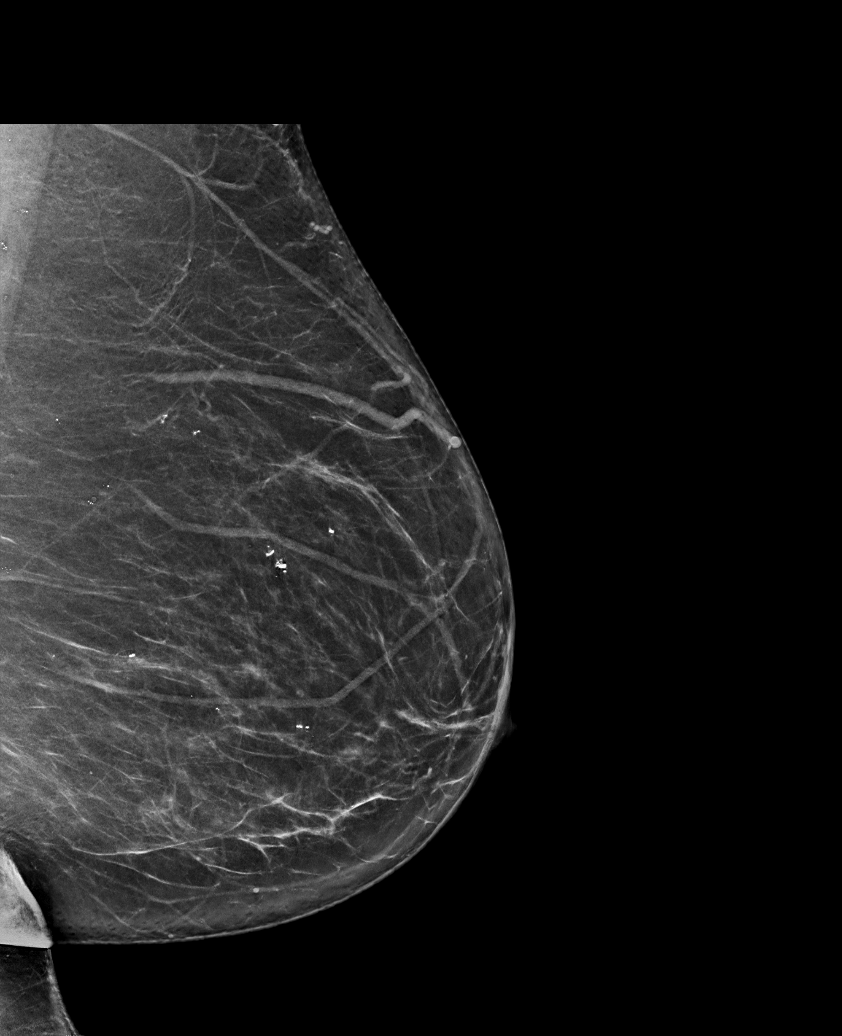

[R MLO synth-2D]
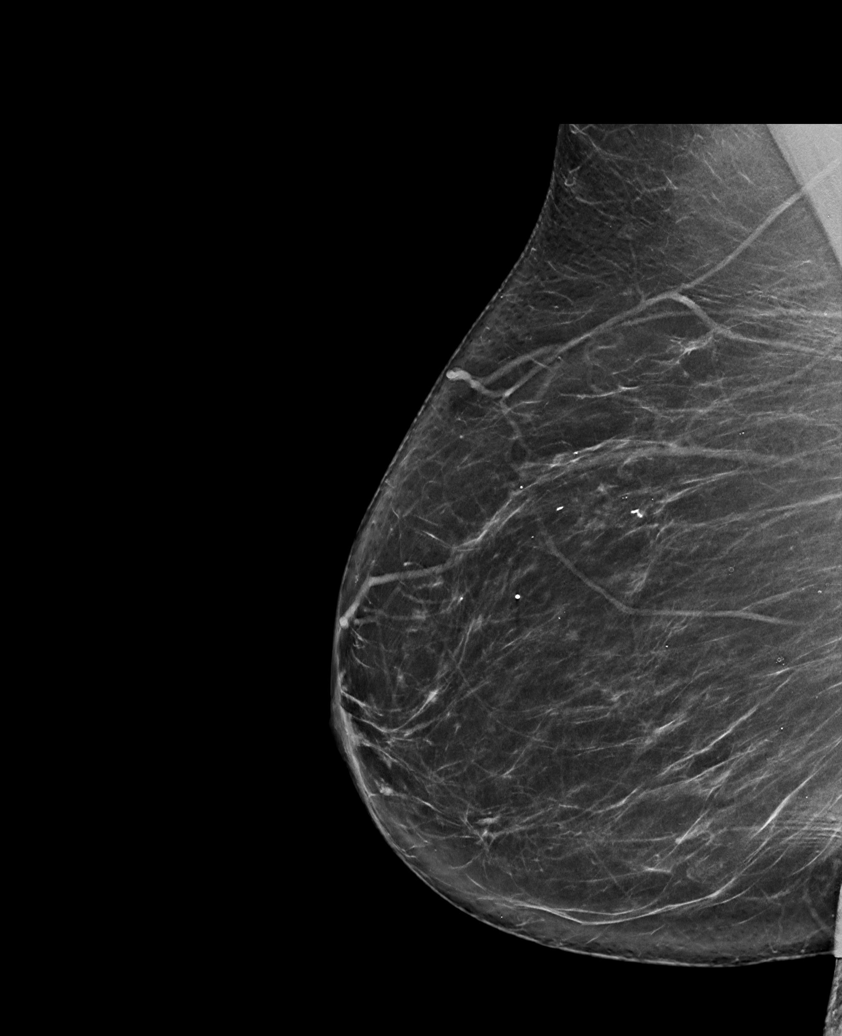

[R CV synth-2D]
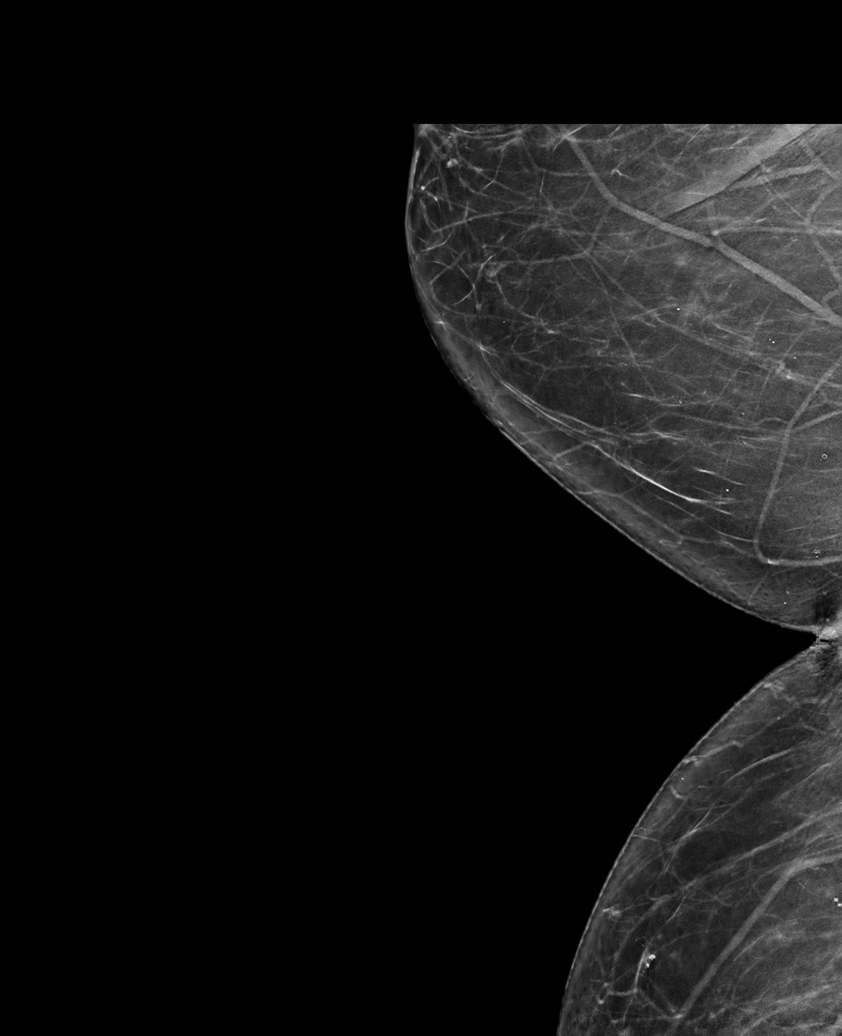

[R CC synth-2D]
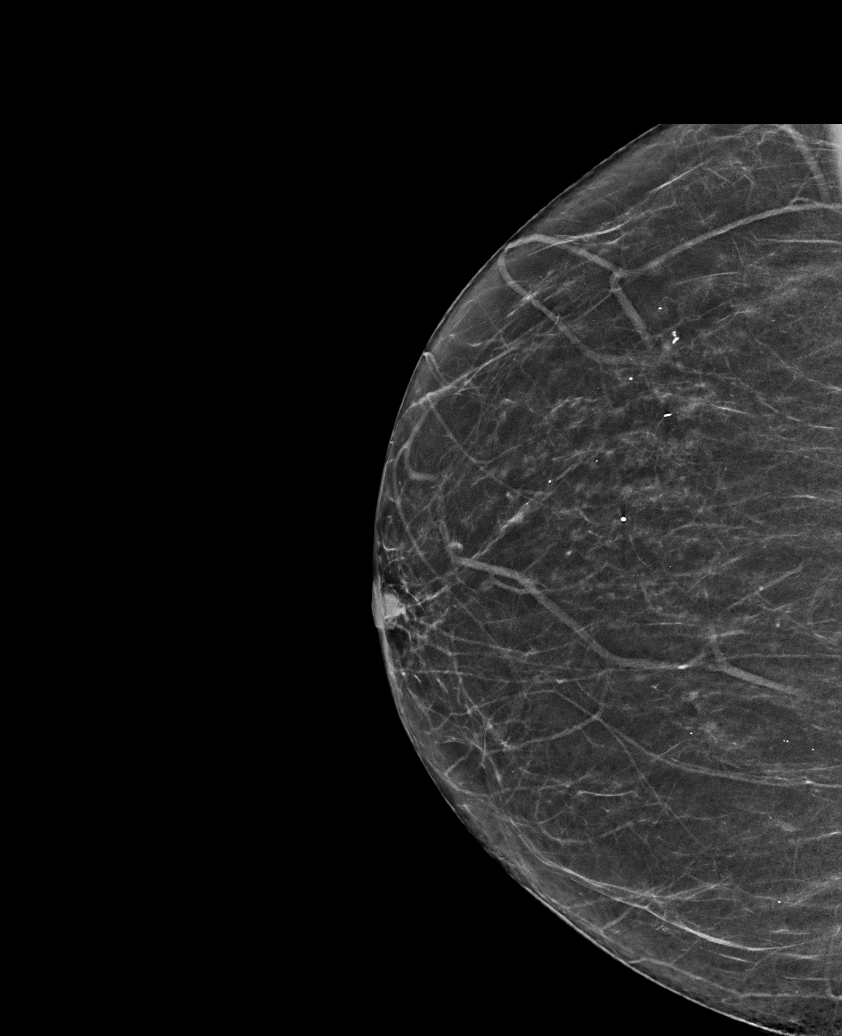

[L CC tomo · tomo slice 37/74.0]
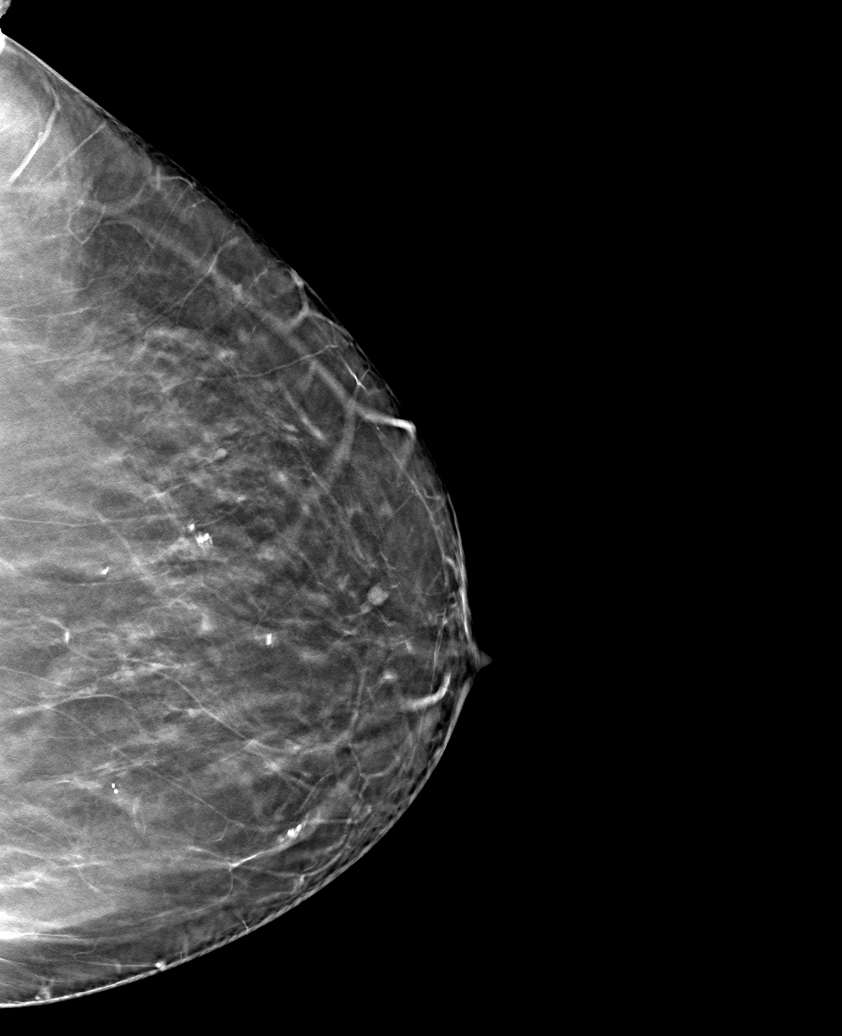

[6 of 30 positions shown; findings below may reference images not displayed]

ACR Breast Density Category b: There are scattered areas of
fibroglandular density.
FINDINGS: There are no findings suspicious for malignancy. Images were
processed with CAD.
IMPRESSION: No mammographic evidence of malignancy. A result letter of this
screening mammogram will be mailed directly to the patient.

RECOMMENDATION:
Screening mammogram in one year. (Code:[id number])

BI-RADS CATEGORY  1: Negative.

## 2020-07-18 ENCOUNTER — Other Ambulatory Visit: Payer: Self-pay

## 2020-07-18 ENCOUNTER — Ambulatory Visit (HOSPITAL_COMMUNITY): Payer: Medicare Other | Attending: Cardiology

## 2020-07-18 DIAGNOSIS — I4819 Other persistent atrial fibrillation: Secondary | ICD-10-CM | POA: Diagnosis not present

## 2020-07-18 DIAGNOSIS — I313 Pericardial effusion (noninflammatory): Secondary | ICD-10-CM

## 2020-07-18 LAB — ECHOCARDIOGRAM COMPLETE: S' Lateral: 3.6 cm

## 2020-07-18 NOTE — Progress Notes (Signed)
Patient ID: Karen Rasmussen, female   DOB: July 23, 1945, 75 y.o.   MRN: [id number]  Echocardiogram 2D Echocardiogram has been performed.  Jennette Dubin 07/18/20

## 2020-07-29 ENCOUNTER — Ambulatory Visit (HOSPITAL_COMMUNITY)
Admission: RE | Admit: 2020-07-29 | Discharge: 2020-07-29 | Disposition: A | Discharge status: Home / Self Care | Payer: Medicare Other | Source: Ambulatory Visit | Attending: Physician Assistant | Admitting: Physician Assistant

## 2020-07-29 ENCOUNTER — Encounter (HOSPITAL_COMMUNITY): Payer: Self-pay | Admitting: Physician Assistant

## 2020-07-29 ENCOUNTER — Other Ambulatory Visit: Payer: Self-pay

## 2020-07-29 ENCOUNTER — Telehealth: Payer: Self-pay | Admitting: Pharmacist

## 2020-07-29 VITALS — BP 124/78 | HR 106 | Ht 62.0 in | Wt 283.0 lb

## 2020-07-29 DIAGNOSIS — G4733 Obstructive sleep apnea (adult) (pediatric): Secondary | ICD-10-CM | POA: Diagnosis not present

## 2020-07-29 DIAGNOSIS — I110 Hypertensive heart disease with heart failure: Secondary | ICD-10-CM | POA: Diagnosis not present

## 2020-07-29 DIAGNOSIS — I5032 Chronic diastolic (congestive) heart failure: Secondary | ICD-10-CM | POA: Diagnosis not present

## 2020-07-29 DIAGNOSIS — Z794 Long term (current) use of insulin: Secondary | ICD-10-CM | POA: Diagnosis not present

## 2020-07-29 DIAGNOSIS — Z7984 Long term (current) use of oral hypoglycemic drugs: Secondary | ICD-10-CM | POA: Diagnosis not present

## 2020-07-29 DIAGNOSIS — Z888 Allergy status to other drugs, medicaments and biological substances status: Secondary | ICD-10-CM | POA: Insufficient documentation

## 2020-07-29 DIAGNOSIS — Z79899 Other long term (current) drug therapy: Secondary | ICD-10-CM | POA: Insufficient documentation

## 2020-07-29 DIAGNOSIS — E669 Obesity, unspecified: Secondary | ICD-10-CM | POA: Diagnosis not present

## 2020-07-29 DIAGNOSIS — D6869 Other thrombophilia: Secondary | ICD-10-CM | POA: Insufficient documentation

## 2020-07-29 DIAGNOSIS — Z8249 Family history of ischemic heart disease and other diseases of the circulatory system: Secondary | ICD-10-CM | POA: Insufficient documentation

## 2020-07-29 DIAGNOSIS — Z7901 Long term (current) use of anticoagulants: Secondary | ICD-10-CM | POA: Insufficient documentation

## 2020-07-29 DIAGNOSIS — Z6841 Body Mass Index (BMI) 40.0 and over, adult: Secondary | ICD-10-CM | POA: Insufficient documentation

## 2020-07-29 DIAGNOSIS — E119 Type 2 diabetes mellitus without complications: Secondary | ICD-10-CM | POA: Diagnosis not present

## 2020-07-29 DIAGNOSIS — I4819 Other persistent atrial fibrillation: Secondary | ICD-10-CM | POA: Insufficient documentation

## 2020-07-29 DIAGNOSIS — Z833 Family history of diabetes mellitus: Secondary | ICD-10-CM | POA: Diagnosis not present

## 2020-07-29 NOTE — Progress Notes (Addendum)
Primary Care Physician: Kathyrn Lass, MD Primary Cardiologist: Dr Radford Pax Primary Electrophysiologist: Dr Caryl Comes (remotely) Referring Physician: Dr Genevie Ann is a 76 y.o. female with a history of HTN, DM, chronic diastolic CHF, atrial fibrillation who presents for follow up in the [facility]. Seen previously by [name] in 2018. She has been maintained on sotalol since 2018. Patient is on Xarelto for a CHADS2VASC score of 5. She was seen by Dr Radford Pax on 05/13/20 and was found to be back in afib. She admits she had stayed up late for a few nights and had not used her CPAP. She has noticed increased fatigue since then. Patient is s/p DCCV 06/03/20.   On follow up today, patient reports that she has had increased fatigue recently. She had an echocardiogram on 07/18/20 and was noted to be back in afib. There were no specific triggers that she could identify. She has been compliant with CPAP therapy.     Today, she denies symptoms of palpitations, chest pain, shortness of breath, orthopnea, PND, lower extremity edema, dizziness, presyncope, syncope, bleeding, or neurologic sequela. The patient is tolerating medications without difficulties and is otherwise without complaint today.    Atrial Fibrillation Risk Factors:  she does have symptoms or diagnosis of sleep apnea. she is compliant with CPAP therapy. she does not have a history of rheumatic fever.   she has a BMI of Body mass index is 51.76 kg/m.Marland Kitchen Filed Weights   07/29/20 1449  Weight: 128.4 kg     Family History  Problem Relation Age of Onset   Cancer Father    Heart failure Father    Diabetes Father    CAD Mother    Diabetes Brother    Hypertension Brother    Breast cancer Neg Hx      Atrial Fibrillation Management history:  Previous antiarrhythmic drugs: sotalol  Previous cardioversions: 2018 x 3, 06/03/20 Previous ablations: none CHADS2VASC score: 5 Anticoagulation history:  Xarelto    Past Medical History:  Diagnosis Date   Abdominal pain, left lower quadrant    Asthmatic bronchitis    Benign essential HTN 04/26/2016   Bursitis of hip    Chronic diastolic CHF (congestive heart failure) (HCC)    DCM (dilated cardiomyopathy) (HCC)    EF 45-50%   GERD (gastroesophageal reflux disease)    High cholesterol    History of kidney stones    Hypersomnia    Morbid obesity with BMI of 50.0-59.9, adult ([place])    OSA on CPAP    Osteoarthritis    "right hip; both knees" (09/21/2016)   Persistent atrial fibrillation ([place]) 04/26/2016   Type II diabetes mellitus ([place])    Vitamin D deficiency disease    Past Surgical History:  Procedure Laterality Date   ABDOMINAL HYSTERECTOMY     APPENDECTOMY     BALLOON DILATION N/A 11/17/2017   Procedure: BALLOON DILATION;  Surgeon: Ronnette Juniper, MD;  Location: [facility] ENDOSCOPY;  Service: Gastroenterology;  Laterality: N/A;   BIOPSY  11/17/2017   Procedure: BIOPSY;  Surgeon: Ronnette Juniper, MD;  Location: [facility];  Service: Gastroenterology;;   CARDIAC CATHETERIZATION     CARDIOVERSION N/A 06/11/2016   Procedure: CARDIOVERSION;  Surgeon: Dorothy Spark, MD;  Location: [facility] ENDOSCOPY;  Service: Cardiovascular;  Laterality: N/A;   CARDIOVERSION N/A 07/16/2016   Procedure: CARDIOVERSION;  Surgeon: Thayer Headings, MD;  Location[facility] ENDOSCOPY;  Service: Cardiovascular;  Laterality: N/A;   CARDIOVERSION N/A 09/23/2016  Procedure: CARDIOVERSION;  Surgeon: Sanda Klein, MD;  Location: [facility] ENDOSCOPY;  Service: Cardiovascular;  Laterality: N/A;   CARDIOVERSION N/A 06/03/2020   Procedure: CARDIOVERSION;  Surgeon: Donato Heinz, MD;  Location: [facility] ENDOSCOPY;  Service: Cardiovascular;  Laterality: N/A;   CHOLECYSTECTOMY OPEN     COLONOSCOPY N/A 11/17/2017   Procedure: COLONOSCOPY;  Surgeon: Ronnette Juniper, MD;  Location: [facility] ENDOSCOPY;  Service: Gastroenterology;  Laterality: N/A;   cortisone injection to right knee     costisone  injection to left knee  11/03/2017   CYSTOSCOPY WITH RETROGRADE PYELOGRAM, URETEROSCOPY AND STENT PLACEMENT Left 01/14/2019   Procedure: CYSTOSCOPy  STENT PLACEMENT;  Surgeon: Robley Fries, MD;  Location: [facility] ORS;  Service: Urology;  Laterality: Left;   CYSTOSCOPY WITH RETROGRADE PYELOGRAM, URETEROSCOPY AND STENT PLACEMENT Left 03/06/2019   Procedure: CYSTOSCOPY WITH RETROGRADE PYELOGRAM, URETEROSCOPY AND STENT PLACEMENT;  Surgeon: Robley Fries, MD;  Location: [facility]S;  Service: Urology;  Laterality: Left;  90 MINS   CYSTOSCOPY WITH STENT PLACEMENT Left 03/07/2019   Procedure: CYSTOSCOPY WITH , URETEROSCOPY/ STENT PLACEMENT;  Surgeon: Ardis Hughs, MD;  Location: [facility] OR[facility];  Service: Urology;  Laterality: Left;   DILATION AND CURETTAGE OF UTERUS     S/P miscarriage   ESOPHAGOGASTRODUODENOSCOPY N/A 11/17/2017   Procedure: ESOPHAGOGASTRODUODENOSCOPY (EGD);  Surgeon: Ronnette Juniper, MD;  Location: [facility];  Service: Gastroenterology;  Laterality: N/A;   HOLMIUM LASER APPLICATION Left 0/34/7425   Procedure: HOLMIUM LASER APPLICATION;  Surgeon: Robley Fries, MD;  Location: [facility];  Service: Urology;  Laterality: Left;   NASAL SEPTUM SURGERY     POLYPECTOMY  11/17/2017   Procedure: POLYPECTOMY;  Surgeon: Ronnette Juniper, MD;  Location: [facility] ENDOSCOPY;  Service: Gastroenterology;;   TONSILLECTOMY      Current Outpatient Medications  Medication Sig Dispense Refill   acetaminophen (TYLENOL) 650 MG CR tablet Take 650 mg by mouth 2 (two) times daily as needed for pain.     alendronate (FOSAMAX) 70 MG tablet Take 70 mg by mouth every Sunday. Take with a full glass of water on an empty stomach.     BESIVANCE 0.6 % SUSP Place 1 drop into the right eye 3 (three) times daily.     betamethasone dipropionate 0.05 % cream Apply 1 application topically 2 (two) times daily.     Biotin w/ Vitamins C & E (HAIR SKIN & NAILS GUMMIES PO) Take 2 tablets by mouth daily.      blood glucose meter kit and  supplies Dispense based on patient and insurance preference. Use up to four times daily as directed. (FOR ICD-10 E10.9, E11.9). 1 each 0   Calcium-Vitamin D-Vitamin K (CALCIUM + D + K PO) Take 1 tablet by mouth in the morning.     carvedilol (COREG) 3.125 MG tablet TAKE 1 TABLET TWICE DAILY  WITH MEALS 180 tablet 3   Difluprednate 0.05 % EMUL Place 1 drop into the right eye 4 (four) times daily.     diphenhydrAMINE-zinc acetate (BENADRYL) cream Apply 1 application topically 3 (three) times daily as needed for itching.     fluocinolone (SYNALAR) 0.01 % external solution Apply 1 application topically 2 (two) times daily as needed (skin irritation).     folic acid (FOLVITE) 956 MCG tablet Take 800 mcg by mouth daily.     furosemide (LASIX) 20 MG tablet TAKE 1 TABLET (20 MG) DAILY AND THEN 1 TABLET AS NEEDED DAILY FOR SWELLING.... 180 tablet 3   gabapentin (NEURONTIN) 300 MG capsule  Take 300 mg by mouth at bedtime.     HYDROcodone-acetaminophen (NORCO) 5-325 MG tablet Take 1 tablet by mouth every 4 (four) hours as needed for moderate pain (kidney stone pain). 20 tablet 0   Insulin Glargine (BASAGLAR KWIKPEN [place]) Inject 34 Units into the skin in the morning.     Insulin Pen Needle 31G X 5 MM MISC 12 Units by Does not apply route 3 (three) times daily. 100 each 0   Ipratropium-Albuterol (COMBIVENT RESPIMAT) 20-100 MCG/ACT AERS respimat Inhale 1 puff into the lungs every 6 (six) hours as needed for wheezing or shortness of breath. Use 3 times daily x 5 days, then every 6 hours as needed. 4 g 1   metFORMIN (GLUCOPHAGE) 500 MG tablet Take 500 mg by mouth 2 (two) times daily with a meal.     Misc Natural Products (GLUCOSAMINE CHONDROITIN TRIPLE) TABS Take 2 tablets by mouth daily.      Multiple Vitamins-Minerals (CENTRUM SILVER) CHEW Chew 2 tablets by mouth daily. soft chews     pantoprazole (PROTONIX) 40 MG tablet Take 1 tablet (40 mg total) by mouth daily. 90 tablet 1   Polyethyl Glycol-Propyl Glycol  (SYSTANE) 0.4-0.3 % SOLN Place 1-2 drops into both eyes 3 (three) times daily as needed (dry/irritated eyes).     potassium chloride (KLOR-CON) 10 MEQ tablet TAKE 2 TABLETS BY MOUTH TWICE DAILY 360 tablet 1   PROLENSA 0.07 % SOLN SMARTSIG:1 Drop(s) Right Eye Every Evening     psyllium (METAMUCIL SMOOTH TEXTURE) 28 % packet Take 1 packet by mouth in the morning.     rivaroxaban (XARELTO) 20 MG TABS tablet Take 1 tablet (20 mg total) by mouth every evening. 90 tablet 3   simvastatin (ZOCOR) 10 MG tablet Take 1 tablet (10 mg total) by mouth at bedtime. 90 tablet 1   sotalol (BETAPACE) 80 MG tablet TAKE 1 TABLET EVERY 12     HOURS (Patient taking differently: TAKE 1 TABLET EVERY 12     HOURS) 180 tablet 2   spironolactone (ALDACTONE) 25 MG tablet TAKE 1/2 TABLET (12.5MG    TOTAL) DAILY 45 tablet 2   triamcinolone cream (KENALOG) 0.1 % Apply 1 application topically 2 (two) times daily as needed (itching legs).      No current facility-administered medications for this encounter.    Allergies  Allergen Reactions   Diphenhydramine Other (See Comments)    heart issues   No [employer] Allergies     Defibrillator pads cause rash.    Social History   Socioeconomic History   Marital status: Unknown    Spouse name: Not on file   Number of children: Not on file   Years of education: Not on file   Highest education level: Not on file  Occupational History   Not on file  Tobacco Use   Smoking status: Never   Smokeless tobacco: Never  Vaping Use   Vaping Use: Never used  Substance and Sexual Activity   Alcohol use: Not Currently   Drug use: No   Sexual activity: Never  Other Topics Concern   Not on file  Social History Narrative   ** Merged History Encounter **       Social Determinants of Health   Financial Resource Strain: Not on file  Food Insecurity: Not on file  Transportation Needs: Not on file  Physical Activity: Not on file  Stress: Not on file  Social Connections:  Not on file  Intimate Partner Violence: Not on  file     ROS- All systems are reviewed and negative except as per the HPI above.  Physical Exam: Vitals:   07/29/20 1449  BP: 124/78  Pulse: (!) 106  Weight: 128.4 kg  Height: 5' 2"  (1.575 m)    GEN- The patient is a well appearing obese female, alert and oriented x 3 today.   HEENT-head normocephalic, atraumatic, sclera clear, conjunctiva pink, hearing intact, trachea midline. Lungs- Clear to ausculation bilaterally, normal work of breathing Heart- irregular rate and rhythm, no murmurs, rubs or gallops  GI- soft, NT, ND, + BS Extremities- no clubbing, cyanosis, or edema MS- no significant deformity or atrophy Skin- no rash or lesion Psych- euthymic mood, full affect Neuro- strength and sensation are intact   Wt Readings from Last 3 Encounters:  07/29/20 128.4 kg  06/10/20 128.2 kg  06/03/20 127.9 kg    EKG today demonstrates  Afib, LAFB, slow R wave prog Vent. rate 106 BPM PR interval * ms QRS duration 110 ms QT/QTcB 396/526 ms  Echo 07/18/20 demonstrated  1. Left ventricular ejection fraction, by estimation, is 50 to 55%. The  left ventricle has low normal function. The left ventricle has no regional wall motion abnormalities. Diastolic function indeterminant due to Afib.   2. Right ventricular systolic function is mildly reduced. The right  ventricular size is normal. Tricuspid regurgitation signal is inadequate for assessing PA pressure.   3. A small pericardial effusion is present. The pericardial effusion is circumferential and measures 0.85cm at end-diastole.   4. The mitral valve is normal in structure. Trivial mitral valve  regurgitation.   5. The aortic valve is tricuspid. There is mild calcification of the  aortic valve. There is mild thickening of the aortic valve. Aortic valve  regurgitation is trivial. Mild aortic valve sclerosis is present, with no  evidence of aortic valve stenosis.   6. The inferior  vena cava is normal in size with greater than 50%  respiratory variability, suggesting right atrial pressure of 3 mmHg.   Comparison(s): Compared to prior TTE in 02/2019, the patient is now in Afib with LVEF 50-55% (previously 55-60%). There continues to be a small, circumferential pericardial effusion that is grossly unchanged in size.   Epic records are reviewed at length today  CHA2DS2-VASc Score = 5  The patient's score is based upon: CHF History: Yes HTN History: Yes Diabetes History: Yes Stroke History: No Vascular Disease History: No Age Score: 1 Gender Score: 1      ASSESSMENT AND PLAN: 1. Persistent Atrial Fibrillation (ICD10:  I48.19) The patient's CHA2DS2-VASc score is 5, indicating a 7.2% annual risk of stroke.  Patient back in afib. We discussed therapeutic options today including increasing sotalol vs changing AAD. After discussing the risks and benefits, will change sotalol to dofetilide.  Patient to check on price of dofetilide. Continue Xarelto 20 mg daily, states no missed doses in the last 3 weeks. No recent benadryl use PharmD to screen medications. Stop sotalol 3 days prior to admission.  QTc in SR 447 ms Recent bmet/mag reviewed.  Increase Coreg to 6.25 mg BID for rate control.   2. Secondary Hypercoagulable State (ICD10:  D68.69) The patient is at significant risk for stroke/thromboembolism based upon her CHA2DS2-VASc Score of 5.  Continue Rivaroxaban (Xarelto).   3. Obesity Body mass index is 51.76 kg/m. Lifestyle modification was discussed and encouraged including regular physical activity and weight reduction.  4. Obstructive sleep apnea Patient reports compliance with CPAP therapy. Followed by Dr  Turner.  5. HTN Stable, med changes as above.  6. Chronic diastolic CHF No signs or symptoms of fluid overload today.   Follow up in the AF clinic for dofetilide admission 7/19.   [facility] [address] [place], [place] [address] [phone] 07/29/2020 2:55 PM

## 2020-07-29 NOTE — Patient Instructions (Signed)
Increase coreg to 6.25mg  twice a day (2 of your 3.125mg  twice a day)  On Friday, July 15th - STOP Sotalol

## 2020-07-29 NOTE — Telephone Encounter (Signed)
Medication list reviewed in anticipation of upcoming Tikosyn initiation. Patient is not taking any contraindicated medications. Metformin can increase the concentration of Tikosyn. Close monitoring toxicity including QTc should be done.   K and Mag should be monitored closely. K should remain >4 and Mag >2 especially since patient is on furosemide.  Patient is anticoagulated on Xarelto on the appropriate dose. Please ensure that patient has not missed any anticoagulation doses in the 3 weeks prior to Tikosyn initiation.   Patient will need to be counseled to avoid use of Benadryl while on Tikosyn and in the 2-3 days prior to Tikosyn initiation.  Patient already knows Sotalol should be stopped 3 days prior to admission.

## 2020-07-31 DIAGNOSIS — K219 Gastro-esophageal reflux disease without esophagitis: Secondary | ICD-10-CM | POA: Diagnosis not present

## 2020-07-31 DIAGNOSIS — M8580 Other specified disorders of bone density and structure, unspecified site: Secondary | ICD-10-CM | POA: Diagnosis not present

## 2020-07-31 DIAGNOSIS — I10 Essential (primary) hypertension: Secondary | ICD-10-CM | POA: Diagnosis not present

## 2020-07-31 DIAGNOSIS — F329 Major depressive disorder, single episode, unspecified: Secondary | ICD-10-CM | POA: Diagnosis not present

## 2020-07-31 DIAGNOSIS — M810 Age-related osteoporosis without current pathological fracture: Secondary | ICD-10-CM | POA: Diagnosis not present

## 2020-07-31 DIAGNOSIS — I119 Hypertensive heart disease without heart failure: Secondary | ICD-10-CM | POA: Diagnosis not present

## 2020-07-31 DIAGNOSIS — I4891 Unspecified atrial fibrillation: Secondary | ICD-10-CM | POA: Diagnosis not present

## 2020-07-31 DIAGNOSIS — E119 Type 2 diabetes mellitus without complications: Secondary | ICD-10-CM | POA: Diagnosis not present

## 2020-07-31 DIAGNOSIS — E1169 Type 2 diabetes mellitus with other specified complication: Secondary | ICD-10-CM | POA: Diagnosis not present

## 2020-07-31 DIAGNOSIS — E7800 Pure hypercholesterolemia, unspecified: Secondary | ICD-10-CM | POA: Diagnosis not present

## 2020-08-22 ENCOUNTER — Other Ambulatory Visit (HOSPITAL_COMMUNITY)
Admission: RE | Admit: 2020-08-22 | Discharge: 2020-08-22 | Disposition: A | Discharge status: Home / Self Care | Payer: Medicare Other | Source: Ambulatory Visit | Attending: Physician Assistant | Admitting: Physician Assistant

## 2020-08-22 DIAGNOSIS — Z01812 Encounter for preprocedural laboratory examination: Secondary | ICD-10-CM | POA: Insufficient documentation

## 2020-08-22 DIAGNOSIS — Z20822 Contact with and (suspected) exposure to covid-19: Secondary | ICD-10-CM | POA: Insufficient documentation

## 2020-08-22 LAB — SARS CORONAVIRUS 2 (TAT 6-24 HRS): SARS Coronavirus 2: NEGATIVE

## 2020-08-25 ENCOUNTER — Telehealth: Payer: Self-pay | Admitting: Cardiology

## 2020-08-25 NOTE — Telephone Encounter (Signed)
All patient's questions answered for tikosyn admission tomorrow.

## 2020-08-25 NOTE — Telephone Encounter (Signed)
Patient has some question's regarding her procedure tomorrow with Dr. Quentin Ore.

## 2020-08-26 ENCOUNTER — Encounter (HOSPITAL_COMMUNITY): Payer: Self-pay | Admitting: Physician Assistant

## 2020-08-26 ENCOUNTER — Ambulatory Visit (HOSPITAL_COMMUNITY)
Admission: RE | Admit: 2020-08-26 | Discharge: 2020-08-26 | Disposition: A | Discharge status: Home / Self Care | Payer: Medicare Other | Source: Ambulatory Visit | Attending: Physician Assistant | Admitting: Physician Assistant

## 2020-08-26 ENCOUNTER — Inpatient Hospital Stay (HOSPITAL_COMMUNITY)
Admission: AD | Admit: 2020-08-26 | Discharge: 2020-08-29 | DRG: 309 | Disposition: A | Discharge status: Home / Self Care | Payer: Medicare Other | Source: Ambulatory Visit | Attending: Cardiology | Admitting: Cardiology

## 2020-08-26 ENCOUNTER — Other Ambulatory Visit: Payer: Self-pay

## 2020-08-26 ENCOUNTER — Encounter (HOSPITAL_COMMUNITY): Payer: Self-pay | Admitting: Cardiology

## 2020-08-26 VITALS — BP 132/78 | HR 112 | Ht 62.0 in | Wt 287.0 lb

## 2020-08-26 DIAGNOSIS — I110 Hypertensive heart disease with heart failure: Secondary | ICD-10-CM | POA: Diagnosis present

## 2020-08-26 DIAGNOSIS — E119 Type 2 diabetes mellitus without complications: Secondary | ICD-10-CM | POA: Diagnosis present

## 2020-08-26 DIAGNOSIS — Z7901 Long term (current) use of anticoagulants: Secondary | ICD-10-CM

## 2020-08-26 DIAGNOSIS — K219 Gastro-esophageal reflux disease without esophagitis: Secondary | ICD-10-CM | POA: Diagnosis present

## 2020-08-26 DIAGNOSIS — Z9109 Other allergy status, other than to drugs and biological substances: Secondary | ICD-10-CM | POA: Diagnosis not present

## 2020-08-26 DIAGNOSIS — E559 Vitamin D deficiency, unspecified: Secondary | ICD-10-CM | POA: Diagnosis present

## 2020-08-26 DIAGNOSIS — Z833 Family history of diabetes mellitus: Secondary | ICD-10-CM | POA: Diagnosis not present

## 2020-08-26 DIAGNOSIS — Z7983 Long term (current) use of bisphosphonates: Secondary | ICD-10-CM

## 2020-08-26 DIAGNOSIS — Z960 Presence of urogenital implants: Secondary | ICD-10-CM | POA: Diagnosis present

## 2020-08-26 DIAGNOSIS — I420 Dilated cardiomyopathy: Secondary | ICD-10-CM | POA: Diagnosis not present

## 2020-08-26 DIAGNOSIS — Z9049 Acquired absence of other specified parts of digestive tract: Secondary | ICD-10-CM

## 2020-08-26 DIAGNOSIS — G4733 Obstructive sleep apnea (adult) (pediatric): Secondary | ICD-10-CM | POA: Diagnosis not present

## 2020-08-26 DIAGNOSIS — Z888 Allergy status to other drugs, medicaments and biological substances status: Secondary | ICD-10-CM

## 2020-08-26 DIAGNOSIS — Z8249 Family history of ischemic heart disease and other diseases of the circulatory system: Secondary | ICD-10-CM

## 2020-08-26 DIAGNOSIS — I5032 Chronic diastolic (congestive) heart failure: Secondary | ICD-10-CM | POA: Diagnosis not present

## 2020-08-26 DIAGNOSIS — Z7984 Long term (current) use of oral hypoglycemic drugs: Secondary | ICD-10-CM

## 2020-08-26 DIAGNOSIS — I4819 Other persistent atrial fibrillation: Principal | ICD-10-CM | POA: Diagnosis present

## 2020-08-26 DIAGNOSIS — Z23 Encounter for immunization: Secondary | ICD-10-CM | POA: Diagnosis not present

## 2020-08-26 DIAGNOSIS — Z6841 Body Mass Index (BMI) 40.0 and over, adult: Secondary | ICD-10-CM | POA: Diagnosis not present

## 2020-08-26 DIAGNOSIS — Z794 Long term (current) use of insulin: Secondary | ICD-10-CM | POA: Diagnosis not present

## 2020-08-26 DIAGNOSIS — D6869 Other thrombophilia: Secondary | ICD-10-CM | POA: Diagnosis present

## 2020-08-26 DIAGNOSIS — Z713 Dietary counseling and surveillance: Secondary | ICD-10-CM | POA: Diagnosis not present

## 2020-08-26 DIAGNOSIS — Z79899 Other long term (current) drug therapy: Secondary | ICD-10-CM | POA: Diagnosis not present

## 2020-08-26 LAB — BASIC METABOLIC PANEL
Anion gap: 8 (ref 5–15)
BUN: 16 mg/dL (ref 8–23)
CO2: 26 mmol/L (ref 22–32)
Calcium: 9.5 mg/dL (ref 8.9–10.3)
Chloride: 104 mmol/L (ref 98–111)
Creatinine, Ser: 0.97 mg/dL (ref 0.44–1.00)
GFR, Estimated: >60 mL/min (ref >60)
Glucose, Bld: 169 mg/dL — ABNORMAL HIGH (ref 70–99)
Potassium: 4.2 mmol/L (ref 3.5–5.1)
Sodium: 138 mmol/L (ref 135–145)

## 2020-08-26 LAB — MAGNESIUM: Magnesium: 2.2 mg/dL (ref 1.7–2.4)

## 2020-08-26 MED ORDER — GLUCOSAMINE CHONDROITIN TRIPLE PO TABS: 2.0000 | ORAL_TABLET | Freq: Every day | ORAL | Status: DC

## 2020-08-26 MED ORDER — CALCIUM CARBONATE-VITAMIN D 500-200 MG-UNIT PO TABS
1.0000 | ORAL_TABLET | Freq: Every morning | ORAL | Status: DC
  Administered 2020-08-27 – 2020-08-29 (×3): 1 via ORAL
  Filled 2020-08-26 (×3): qty 1

## 2020-08-26 MED ORDER — GATIFLOXACIN 0.5 % OP SOLN
1.0000 [drp] | Freq: Four times a day (QID) | OPHTHALMIC | Status: DC
  Filled 2020-08-26: qty 2.5

## 2020-08-26 MED ORDER — SODIUM CHLORIDE 0.9% FLUSH
3.0000 mL | Freq: Two times a day (BID) | INTRAVENOUS | Status: DC
  Administered 2020-08-27 – 2020-08-28 (×2): 3 mL via INTRAVENOUS

## 2020-08-26 MED ORDER — SIMVASTATIN 20 MG PO TABS
10.0000 mg | ORAL_TABLET | Freq: Every day | ORAL | Status: DC
  Administered 2020-08-26 – 2020-08-28 (×3): 10 mg via ORAL
  Filled 2020-08-26 (×3): qty 1

## 2020-08-26 MED ORDER — IPRATROPIUM-ALBUTEROL 0.5-2.5 (3) MG/3ML IN SOLN: 3.0000 mL | Freq: Four times a day (QID) | RESPIRATORY_TRACT | Status: DC | PRN

## 2020-08-26 MED ORDER — ADULT MULTIVITAMIN W/MINERALS CH
1.0000 | ORAL_TABLET | Freq: Every day | ORAL | Status: DC
  Administered 2020-08-27 – 2020-08-29 (×3): 1 via ORAL
  Filled 2020-08-26 (×3): qty 1

## 2020-08-26 MED ORDER — POTASSIUM CHLORIDE CRYS ER 20 MEQ PO TBCR
20.0000 meq | EXTENDED_RELEASE_TABLET | Freq: Two times a day (BID) | ORAL | Status: DC
  Administered 2020-08-26 – 2020-08-29 (×6): 20 meq via ORAL
  Filled 2020-08-26 (×6): qty 1

## 2020-08-26 MED ORDER — DOFETILIDE 500 MCG PO CAPS
500.0000 ug | ORAL_CAPSULE | Freq: Two times a day (BID) | ORAL | Status: DC
  Administered 2020-08-26 – 2020-08-29 (×6): 500 ug via ORAL
  Filled 2020-08-26 (×6): qty 1

## 2020-08-26 MED ORDER — PSYLLIUM 95 % PO PACK
1.0000 | PACK | Freq: Every morning | ORAL | Status: DC
  Administered 2020-08-27 – 2020-08-29 (×3): 1 via ORAL
  Filled 2020-08-26 (×3): qty 1

## 2020-08-26 MED ORDER — METFORMIN HCL 500 MG PO TABS
500.0000 mg | ORAL_TABLET | Freq: Two times a day (BID) | ORAL | Status: DC
  Administered 2020-08-26 – 2020-08-29 (×6): 500 mg via ORAL
  Filled 2020-08-26 (×6): qty 1

## 2020-08-26 MED ORDER — SODIUM CHLORIDE 0.9% FLUSH: 3.0000 mL | INTRAVENOUS | Status: DC | PRN

## 2020-08-26 MED ORDER — INSULIN PEN NEEDLE 31G X 5 MM MISC: 12.0000 [IU] | Freq: Three times a day (TID) | Status: DC

## 2020-08-26 MED ORDER — DIFLUPREDNATE 0.05 % OP EMUL: 1.0000 [drp] | Freq: Four times a day (QID) | OPHTHALMIC | Status: DC

## 2020-08-26 MED ORDER — FUROSEMIDE 20 MG PO TABS: 20.0000 mg | ORAL_TABLET | Freq: Every day | ORAL | Status: DC

## 2020-08-26 MED ORDER — INSULIN GLARGINE 100 UNIT/ML ~~LOC~~ SOLN
34.0000 [IU] | Freq: Every morning | SUBCUTANEOUS | Status: DC
  Administered 2020-08-27 – 2020-08-29 (×3): 34 [IU] via SUBCUTANEOUS
  Filled 2020-08-26 (×3): qty 0.34

## 2020-08-26 MED ORDER — FOLIC ACID 1 MG PO TABS
1.0000 mg | ORAL_TABLET | Freq: Every day | ORAL | Status: DC
  Administered 2020-08-27 – 2020-08-29 (×3): 1 mg via ORAL
  Filled 2020-08-26 (×3): qty 1

## 2020-08-26 MED ORDER — RIVAROXABAN 20 MG PO TABS
20.0000 mg | ORAL_TABLET | Freq: Every day | ORAL | Status: DC
  Administered 2020-08-26 – 2020-08-28 (×3): 20 mg via ORAL
  Filled 2020-08-26 (×3): qty 1

## 2020-08-26 MED ORDER — SODIUM CHLORIDE 0.9 % IV SOLN: 250.0000 mL | INTRAVENOUS | Status: DC | PRN

## 2020-08-26 MED ORDER — POLYVINYL ALCOHOL 1.4 % OP SOLN
1.0000 [drp] | Freq: Three times a day (TID) | OPHTHALMIC | Status: DC | PRN
  Filled 2020-08-26: qty 15

## 2020-08-26 MED ORDER — KETOROLAC TROMETHAMINE 0.5 % OP SOLN
1.0000 [drp] | Freq: Every day | OPHTHALMIC | Status: DC
  Filled 2020-08-26: qty 5

## 2020-08-26 MED ORDER — SPIRONOLACTONE 12.5 MG HALF TABLET
12.5000 mg | ORAL_TABLET | Freq: Every day | ORAL | Status: DC
  Administered 2020-08-27 – 2020-08-29 (×3): 12.5 mg via ORAL
  Filled 2020-08-26 (×3): qty 1

## 2020-08-26 MED ORDER — PANTOPRAZOLE SODIUM 40 MG PO TBEC
40.0000 mg | DELAYED_RELEASE_TABLET | Freq: Every day | ORAL | Status: DC
  Administered 2020-08-27 – 2020-08-29 (×3): 40 mg via ORAL
  Filled 2020-08-26 (×3): qty 1

## 2020-08-26 MED ORDER — ACETAMINOPHEN ER 650 MG PO TBCR: 650.0000 mg | EXTENDED_RELEASE_TABLET | Freq: Two times a day (BID) | ORAL | Status: DC | PRN

## 2020-08-26 MED ORDER — CARVEDILOL 6.25 MG PO TABS
6.2500 mg | ORAL_TABLET | Freq: Two times a day (BID) | ORAL | Status: DC
  Administered 2020-08-26 – 2020-08-29 (×6): 6.25 mg via ORAL
  Filled 2020-08-26 (×6): qty 1

## 2020-08-26 NOTE — Progress Notes (Signed)
Pi in the bathroom, RN will place IV consult when pt is back in the bed.

## 2020-08-26 NOTE — H&P (Signed)
Primary Care Physician: Kathyrn Lass, MD Primary Cardiologist: Dr Radford Pax Primary Electrophysiologist: Dr Caryl Comes (remotely) Referring Physician: Dr Genevie Ann is a 75 y.o. female with a history of HTN, DM, chronic diastolic CHF, atrial fibrillation who presents for follow up in the [facility]. Seen previously by [name] in 2018. She has been maintained on sotalol since 2018. Patient is on Xarelto for a CHADS2VASC score of 5. She was seen by Dr Radford Pax on 05/13/20 and was found to be back in afib. She admits she had stayed up late for a few nights and had not used her CPAP. She has noticed increased fatigue since then. Patient is s/p DCCV 06/03/20. She had an echocardiogram on 07/18/20 and was noted to be back in afib.   On follow up today, patient presents for dofetilide admission. Patient remains in afib with rapid rates. She does feel much more fatigued. She denies any missed doses of anticoagulation in the last 3 weeks. She has discontinued sotalol.      Today, she denies symptoms of chest pain, shortness of breath, orthopnea, PND, lower extremity edema, dizziness, presyncope, syncope, bleeding, or neurologic sequela. The patient is tolerating medications without difficulties and is otherwise without complaint today.     Atrial Fibrillation Risk Factors:   she does have symptoms or diagnosis of sleep apnea. she is compliant with CPAP therapy. she does not have a history of rheumatic fever.     she has a BMI of Body mass index is 52.49 kg/m.Marland Kitchen    Filed Weights    08/26/20 1128  Weight: 130.2 kg               Family History  Problem Relation Age of Onset   Cancer Father     Heart failure Father     Diabetes Father     CAD Mother     Diabetes Brother     Hypertension Brother     Breast cancer Neg Hx          Atrial Fibrillation Management history:   Previous antiarrhythmic drugs: sotalol Previous cardioversions: 2018 x 3,  06/03/20 Previous ablations: none CHADS2VASC score: 5 Anticoagulation history: Xarelto          Past Medical History:  Diagnosis Date   Abdominal pain, left lower quadrant     Asthmatic bronchitis     Benign essential HTN 04/26/2016   Bursitis of hip     Chronic diastolic CHF (congestive heart failure) (HCC)     DCM (dilated cardiomyopathy) (HCC)      EF 45-50%   GERD (gastroesophageal reflux disease)     High cholesterol     History of kidney stones     Hypersomnia     Morbid obesity with BMI of 50.0-59.9, adult ([place])     OSA on CPAP     Osteoarthritis      "right hip; both knees" (09/21/2016)   Persistent atrial fibrillation ([place]) 04/26/2016   Type II diabetes mellitus ([place])     Vitamin D deficiency disease           Past Surgical History:  Procedure Laterality Date   ABDOMINAL HYSTERECTOMY       APPENDECTOMY       BALLOON DILATION N/A 11/17/2017    Procedure: BALLOON DILATION;  Surgeon: Ronnette Juniper, MD;  Location: [facility] ENDOSCOPY;  Service: Gastroenterology;  Laterality: N/A;   BIOPSY   11/17/2017  Procedure: BIOPSY;  Surgeon: Ronnette Juniper, MD;  Location: [facility];  Service: Gastroenterology;;   CARDIAC CATHETERIZATION       CARDIOVERSION N/A 06/11/2016    Procedure: CARDIOVERSION;  Surgeon: Dorothy Spark, MD;  Location: [facility] ENDOSCOPY;  Service: Cardiovascular;  Laterality: N/A;   CARDIOVERSION N/A 07/16/2016    Procedure: CARDIOVERSION;  Surgeon: Thayer Headings, MD;  Location: [facility] ENDOSCOPY;  Service: Cardiovascular;  Laterality: N/A;   CARDIOVERSION N/A 09/23/2016    Procedure: CARDIOVERSION;  Surgeon: Sanda Klein, MD;  Location: [facility] ENDOSCOPY;  Service: Cardiovascular;  Laterality: N/A;   CARDIOVERSION N/A 06/03/2020    Procedure: CARDIOVERSION;  Surgeon: Donato Heinz, MD;  Location: [facility] ENDOSCOPY;  Service: Cardiovascular;  Laterality: N/A;   CHOLECYSTECTOMY OPEN       COLONOSCOPY N/A 11/17/2017    Procedure: COLONOSCOPY;  Surgeon: Ronnette Juniper, MD;   Location: [facility] ENDOSCOPY;  Service: Gastroenterology;  Laterality: N/A;   cortisone injection to right knee       costisone injection to left knee   11/03/2017   CYSTOSCOPY WITH RETROGRADE PYELOGRAM, URETEROSCOPY AND STENT PLACEMENT Left 01/14/2019    Procedure: CYSTOSCOPy  STENT PLACEMENT;  Surgeon: Robley Fries, MD;  Location: [facility] ORS;  Service: Urology;  Laterality: Left;   CYSTOSCOPY WITH RETROGRADE PYELOGRAM, URETEROSCOPY AND STENT PLACEMENT Left 03/06/2019    Procedure: CYSTOSCOPY WITH RETROGRADE PYELOGRAM, URETEROSCOPY AND STENT PLACEMENT;  Surgeon: Robley Fries, MD;  Location: [facility]S;  Service: Urology;  Laterality: Left;  90 MINS   CYSTOSCOPY WITH STENT PLACEMENT Left 03/07/2019    Procedure: CYSTOSCOPY WITH , URETEROSCOPY/ STENT PLACEMENT;  Surgeon: Ardis Hughs, MD;  Location: [facility] OR[facility];  Service: Urology;  Laterality: Left;   DILATION AND CURETTAGE OF UTERUS        S/P miscarriage   ESOPHAGOGASTRODUODENOSCOPY N/A 11/17/2017    Procedure: ESOPHAGOGASTRODUODENOSCOPY (EGD);  Surgeon: Ronnette Juniper, MD;  Location: [facility];  Service: Gastroenterology;  Laterality: N/A;   HOLMIUM LASER APPLICATION Left 4/70/9628    Procedure: HOLMIUM LASER APPLICATION;  Surgeon: Robley Fries, MD;  Location: [facility];  Service: Urology;  Laterality: Left;   NASAL SEPTUM SURGERY       POLYPECTOMY   11/17/2017    Procedure: POLYPECTOMY;  Surgeon: Ronnette Juniper, MD;  Location: [facility] ENDOSCOPY;  Service: Gastroenterology;;   TONSILLECTOMY                Current Outpatient Medications  Medication Sig Dispense Refill   acetaminophen (TYLENOL) 650 MG CR tablet Take 650 mg by mouth 2 (two) times daily as needed for pain.       alendronate (FOSAMAX) 70 MG tablet Take 70 mg by mouth every Sunday. Take with a full glass of water on an empty stomach.       BESIVANCE 0.6 % SUSP Place 1 drop into the right eye 3 (three) times daily.       betamethasone dipropionate 0.05 % cream Apply 1 application  topically 2 (two) times daily.       Biotin w/ Vitamins C & E (HAIR SKIN & NAILS GUMMIES PO) Take 2 tablets by mouth daily.       blood glucose meter kit and supplies Dispense based on patient and insurance preference. Use up to four times daily as directed. (FOR ICD-10 E10.9, E11.9). 1 each 0   Calcium-Vitamin D-Vitamin K (CALCIUM + D + K PO) Take 1 tablet by mouth in the morning.       carvedilol (COREG) 3.125  MG tablet TAKE 1 TABLET TWICE DAILY  WITH MEALS 180 tablet 3   Difluprednate 0.05 % EMUL Place 1 drop into the right eye 4 (four) times daily.       diphenhydrAMINE-zinc acetate (BENADRYL) cream Apply 1 application topically 3 (three) times daily as needed for itching.       fluocinolone (SYNALAR) 0.01 % external solution Apply 1 application topically 2 (two) times daily as needed (skin irritation).       folic acid (FOLVITE) 242 MCG tablet Take 800 mcg by mouth daily.       furosemide (LASIX) 20 MG tablet TAKE 1 TABLET (20 MG) DAILY AND THEN 1 TABLET AS NEEDED DAILY FOR SWELLING.... 180 tablet 3   gabapentin (NEURONTIN) 300 MG capsule Take 300 mg by mouth at bedtime.       HYDROcodone-acetaminophen (NORCO) 5-325 MG tablet Take 1 tablet by mouth every 4 (four) hours as needed for moderate pain (kidney stone pain). 20 tablet 0   Insulin Glargine (BASAGLAR KWIKPEN [place]) Inject 34 Units into the skin in the morning.       Insulin Pen Needle 31G X 5 MM MISC 12 Units by Does not apply route 3 (three) times daily. 100 each 0   Ipratropium-Albuterol (COMBIVENT RESPIMAT) 20-100 MCG/ACT AERS respimat Inhale 1 puff into the lungs every 6 (six) hours as needed for wheezing or shortness of breath. Use 3 times daily x 5 days, then every 6 hours as needed. 4 g 1   metFORMIN (GLUCOPHAGE) 500 MG tablet Take 500 mg by mouth 2 (two) times daily with a meal.       Misc Natural Products (GLUCOSAMINE CHONDROITIN TRIPLE) TABS Take 2 tablets by mouth daily.       Multiple Vitamins-Minerals (CENTRUM SILVER) CHEW Chew  2 tablets by mouth daily. soft chews       pantoprazole (PROTONIX) 40 MG tablet Take 1 tablet (40 mg total) by mouth daily. 90 tablet 1   Polyethyl Glycol-Propyl Glycol (SYSTANE) 0.4-0.3 % SOLN Place 1-2 drops into both eyes 3 (three) times daily as needed (dry/irritated eyes).       potassium chloride (KLOR-CON) 10 MEQ tablet TAKE 2 TABLETS BY MOUTH TWICE DAILY 360 tablet 1   PROLENSA 0.07 % SOLN SMARTSIG:1 Drop(s) Right Eye Every Evening       psyllium (METAMUCIL SMOOTH TEXTURE) 28 % packet Take 1 packet by mouth in the morning.       rivaroxaban (XARELTO) 20 MG TABS tablet Take 1 tablet (20 mg total) by mouth every evening. 90 tablet 3   simvastatin (ZOCOR) 10 MG tablet Take 1 tablet (10 mg total) by mouth at bedtime. 90 tablet 1   spironolactone (ALDACTONE) 25 MG tablet TAKE 1/2 TABLET (12.5MG    TOTAL) DAILY 45 tablet 2   triamcinolone cream (KENALOG) 0.1 % Apply 1 application topically 2 (two) times daily as needed (itching legs).        No current facility-administered medications for this encounter.           Allergies  Allergen Reactions   Diphenhydramine Other (See Comments)      heart issues   No [employer] Allergies        Defibrillator pads cause rash.      Social History         Socioeconomic History   Marital status: Unknown      Spouse name: Not on file   Number of children: Not on file   Years of education:  Not on file   Highest education level: Not on file  Occupational History   Not on file  Tobacco Use   Smoking status: Never   Smokeless tobacco: Never  Vaping Use   Vaping Use: Never used  Substance and Sexual Activity   Alcohol use: Not Currently   Drug use: No   Sexual activity: Never  Other Topics Concern   Not on file  Social History Narrative    ** Merged History Encounter **         Social Determinants of Health    Financial Resource Strain: Not on file  Food Insecurity: Not on file  Transportation Needs: Not on file  Physical  Activity: Not on file  Stress: Not on file  Social Connections: Not on file  Intimate Partner Violence: Not on file        ROS- All systems are reviewed and negative except as per the HPI above.   Physical Exam:    Vitals:    08/26/20 1128  BP: 132/78  Pulse: (!) 112  Weight: 130.2 kg  Height: _0  (1.575 m)      GEN- The patient is a well appearing obese female, alert and oriented x 3 today.   HEENT-head normocephalic, atraumatic, sclera clear, conjunctiva pink, hearing intact, trachea midline. Lungs- Clear to ausculation bilaterally, normal work of breathing Heart- irregular rate and rhythm, no murmurs, rubs or gallops GI- soft, NT, ND, + BS Extremities- no clubbing, cyanosis, or edema MS- no significant deformity or atrophy Skin- no rash or lesion Psych- euthymic mood, full affect Neuro- strength and sensation are intact        Wt Readings from Last 3 Encounters:  08/26/20 130.2 kg  07/29/20 128.4 kg  06/10/20 128.2 kg      EKG today demonstrates  Afib, LAFB Vent. rate 112 BPM PR interval * ms QRS duration 108 ms QT/QTcB 358/488 ms   Echo 07/18/20 demonstrated 1. Left ventricular ejection fraction, by estimation, is 50 to 55%. The  left ventricle has low normal function. The left ventricle has no regional wall motion abnormalities. Diastolic function indeterminant due to Afib.   2. Right ventricular systolic function is mildly reduced. The right  ventricular size is normal. Tricuspid regurgitation signal is inadequate for assessing PA pressure.   3. A small pericardial effusion is present. The pericardial effusion is circumferential and measures 0.85cm at end-diastole.   4. The mitral valve is normal in structure. Trivial mitral valve  regurgitation.   5. The aortic valve is tricuspid. There is mild calcification of the  aortic valve. There is mild thickening of the aortic valve. Aortic valve  regurgitation is trivial. Mild aortic valve sclerosis is present,  with no  evidence of aortic valve stenosis.   6. The inferior vena cava is normal in size with greater than 50%  respiratory variability, suggesting right atrial pressure of 3 mmHg.   Comparison(s): Compared to prior TTE in 02/2019, the patient is now in Afib with LVEF 50-55% (previously 55-60%). There continues to be a small, circumferential pericardial effusion that is grossly unchanged in size.   Epic records are reviewed at length today   CHA2DS2-VASc Score = 5  The patient's score is based upon: CHF History: Yes HTN History: Yes Diabetes History: Yes Stroke History: No Vascular Disease History: No Age Score: 1 Gender Score: 1        ASSESSMENT AND PLAN: 1. Persistent Atrial Fibrillation (ICD10:  I48.19) The patient's CHA2DS2-VASc score is 5,  indicating a 7.2% annual risk of stroke.  Patient presents for dofetilide admission. Patient aware of price of dofetilide. Continue Continue Xarelto 20 mg daily, states no missed doses in the last 3 weeks. No recent benadryl use PharmD has screened medications. Sotalol stopped 3 days ago. QTc in SR 447 ms Labs today show creatinine at 0.97, K+ 4.2 and mag 2.2, CrCl calculated at 108 mL/min Continue Coreg 6.25 mg BID   2. Secondary Hypercoagulable State (ICD10:  D68.69) The patient is at significant risk for stroke/thromboembolism based upon her CHA2DS2-VASc Score of 5.  Continue Rivaroxaban (Xarelto).   3. Obesity Body mass index is 52.49 kg/m. Lifestyle modification was discussed and encouraged including regular physical activity and weight reduction.   4. Obstructive sleep apnea Patient reports compliance with CPAP therapy. Followed by Dr Radford Pax.   5. HTN Stable, no med changes today.   6. Chronic diastolic CHF No signs or symptoms of fluid overload today. She is on scheduled diuretics. Could consider checking NT-proBNP during admission to see if she would be a candidate for Alleviate-HF trial.      -------------------------------------------------------  I have seen, examined the patient, and reviewed the above assessment and plan.    Ms. Gruetzmacher is a pleasant 75 year old woman with persistent atrial fibrillation who is admitted for Tikosyn loading.  She is in atrial fibrillation with rapid ventricular rates in the 110s during my interview.  The patient also has a history of chronic diastolic heart failure.  She is maintained on Xarelto for stroke prophylaxis.  Her BMI prohibits effective ablation.  Her QTC on today's EKG is 480 ms (personally reviewed). Check BNP.  Vickie Epley, MD 08/26/2020 9:37 PM

## 2020-08-26 NOTE — Care Management (Signed)
1534 08-26-20 Patient presented for Tikosyn Load. Benefits check submitted. Case Manager will follow for cost and pharmacy of choice.

## 2020-08-26 NOTE — Progress Notes (Signed)
Pharmacy: Dofetilide (Tikosyn) - Initial Consult Assessment and Electrolyte Replacement  Pharmacy consulted to assist in monitoring and replacing electrolytes in this 75 y.o. female admitted on 08/26/2020 undergoing dofetilide initiation. First dofetilide dose:  500 mcg PO Q BID  Assessment:  Patient Exclusion Criteria: If any screening criteria checked as "Yes", then  patient  should NOT receive dofetilide until criteria item is corrected.  If "Yes" please indicate correction plan.  YES  NO Patient  Exclusion Criteria Correction Plan   [x]   []   Baseline QTc interval is greater than or equal to 440 msec. IF above YES box checked dofetilide contraindicated unless patient has ICD; then may proceed if QTc 500-550 msec or with known ventricular conduction abnormalities may proceed with QTc 550-600 msec. QTc =    BL QTc 447 msec, per Clint Fenton, PA note; plan to proceed with dofetilide initiation per note   []   [x]   Patient is known or suspected to have a digoxin level greater than 2 ng/ml: No results found for: DIGOXIN     []   [x]   Creatinine clearance less than 20 ml/min (calculated using Cockcroft-Gault, actual body weight and serum creatinine): Estimated Creatinine Clearance: 67.8 mL/min (by C-G formula based on SCr of 0.97 mg/dL).   TBW CrCl 107 ml/min   []   [x]  Patient has received drugs known to prolong the QT intervals within the last 48 hours (phenothiazines, tricyclics or tetracyclic antidepressants, erythromycin, H-1 antihistamines, cisapride, fluoroquinolones, azithromycin, ondansetron).   Updated information on QT prolonging agents is available to be searched on the following database:QT prolonging agents   Sotalol stopped 3 days ago, per PA note   []   [x]   Patient received a dose of hydrochlorothiazide (Oretic) alone or in any combination including triamterene (Dyazide, Maxzide) in the last 48 hours.    []   [x]  Patient received a medication known to increase  dofetilide plasma concentrations prior to initial dofetilide dose:  Trimethoprim (Primsol, Proloprim) in the last 36 hours Verapamil (Calan, Verelan) in the last 36 hours or a sustained release dose in the last 72 hours Megestrol (Megace) in the last 5 days  Cimetidine (Tagamet) in the last 6 hours Ketoconazole (Nizoral) in the last 24 hours Itraconazole (Sporanox) in the last 48 hours  Prochlorperazine (Compazine) in the last 36 hours     []   [x]   Patient is known to have a history of torsades de pointes; congenital or acquired long QT syndromes.    []   [x]   Patient has received a Class 1 antiarrhythmic with less than 2 half-lives since last dose. (Disopyramide, Quinidine, Procainamide, Lidocaine, Mexiletine, Flecainide, Propafenone)    []   [x]   Patient has received amiodarone therapy in the past 3 months or amiodarone level is greater than 0.3 ng/ml.    Patient has been appropriately anticoagulated with Xarelto 20 mg PO daily.  Labs:    Component Value Date/Time   K 4.2 08/26/2020 1134   MG 2.2 08/26/2020 1134     Plan: Potassium: K >/= 4: Appropriate to initiate Tikosyn, no replacement needed    Magnesium: Mg >2: Appropriate to initiate Tikosyn, no replacement needed    Thank you for allowing pharmacy to participate in this patient's care   Gillermina Hu, PharmD, BCPS, [facility] Clinical Pharmacist 08/26/2020  3:49 PM

## 2020-08-26 NOTE — Progress Notes (Signed)
RN advised patient needs a CPAP machine in her room for night rest.  Patient has her mask from home.  I will set patient up on CPAP when ready for sleep.

## 2020-08-26 NOTE — Progress Notes (Signed)
Primary Care Physician: Kathyrn Lass, MD Primary Cardiologist: Dr Radford Pax Primary Electrophysiologist: Dr Caryl Comes (remotely) Referring Physician: Dr Genevie Ann is a 75 y.o. female with a history of HTN, DM, chronic diastolic CHF, atrial fibrillation who presents for follow up in the [facility]. Seen previously by [name] in 2018. She has been maintained on sotalol since 2018. Patient is on Xarelto for a CHADS2VASC score of 5. She was seen by Dr Radford Pax on 05/13/20 and was found to be back in afib. She admits she had stayed up late for a few nights and had not used her CPAP. She has noticed increased fatigue since then. Patient is s/p DCCV 06/03/20. She had an echocardiogram on 07/18/20 and was noted to be back in afib.   On follow up today, patient presents for dofetilide admission. Patient remains in afib with rapid rates. She does feel much more fatigued. She denies any missed doses of anticoagulation in the last 3 weeks. She has discontinued sotalol.     Today, she denies symptoms of chest pain, shortness of breath, orthopnea, PND, lower extremity edema, dizziness, presyncope, syncope, bleeding, or neurologic sequela. The patient is tolerating medications without difficulties and is otherwise without complaint today.    Atrial Fibrillation Risk Factors:  she does have symptoms or diagnosis of sleep apnea. she is compliant with CPAP therapy. she does not have a history of rheumatic fever.   she has a BMI of Body mass index is 52.49 kg/m.Marland Kitchen Filed Weights   08/26/20 1128  Weight: 130.2 kg      Family History  Problem Relation Age of Onset   Cancer Father    Heart failure Father    Diabetes Father    CAD Mother    Diabetes Brother    Hypertension Brother    Breast cancer Neg Hx      Atrial Fibrillation Management history:  Previous antiarrhythmic drugs: sotalol  Previous cardioversions: 2018 x 3, 06/03/20 Previous ablations:  none CHADS2VASC score: 5 Anticoagulation history: Xarelto    Past Medical History:  Diagnosis Date   Abdominal pain, left lower quadrant    Asthmatic bronchitis    Benign essential HTN 04/26/2016   Bursitis of hip    Chronic diastolic CHF (congestive heart failure) (HCC)    DCM (dilated cardiomyopathy) (HCC)    EF 45-50%   GERD (gastroesophageal reflux disease)    High cholesterol    History of kidney stones    Hypersomnia    Morbid obesity with BMI of 50.0-59.9, adult ([place])    OSA on CPAP    Osteoarthritis    "right hip; both knees" (09/21/2016)   Persistent atrial fibrillation ([place]) 04/26/2016   Type II diabetes mellitus ([place])    Vitamin D deficiency disease    Past Surgical History:  Procedure Laterality Date   ABDOMINAL HYSTERECTOMY     APPENDECTOMY     BALLOON DILATION N/A 11/17/2017   Procedure: BALLOON DILATION;  Surgeon: Ronnette Juniper, MD;  Location: [facility];  Service: Gastroenterology;  Laterality: N/A;   BIOPSY  11/17/2017   Procedure: BIOPSY;  Surgeon: Ronnette Juniper, MD;  Location: [facility];  Service: Gastroenterology;;   CARDIAC CATHETERIZATION     CARDIOVERSION N/A 06/11/2016   Procedure: CARDIOVERSION;  Surgeon: Dorothy Spark, MD;  Location: [facility] ENDOSCOPY;  Service: Cardiovascular;  Laterality: N/A;   CARDIOVERSION N/A 07/16/2016   Procedure: CARDIOVERSION;  Surgeon: Thayer Headings, MD;  Location: [facility];  Service:  Cardiovascular;  Laterality: N/A;   CARDIOVERSION N/A 09/23/2016   Procedure: CARDIOVERSION;  Surgeon: Sanda Klein, MD;  Location: [facility]OSCOPY;  Service: Cardiovascular;  Laterality: N/A;   CARDIOVERSION N/A 06/03/2020   Procedure: CARDIOVERSION;  Surgeon: Donato Heinz, MD;  Location: [facility] ENDOSCOPY;  Service: Cardiovascular;  Laterality: N/A;   CHOLECYSTECTOMY OPEN     COLONOSCOPY N/A 11/17/2017   Procedure: COLONOSCOPY;  Surgeon: Ronnette Juniper, MD;  Location: [facility];  Service: Gastroenterology;  Laterality: N/A;    cortisone injection to right knee     costisone injection to left knee  11/03/2017   CYSTOSCOPY WITH RETROGRADE PYELOGRAM, URETEROSCOPY AND STENT PLACEMENT Left 01/14/2019   Procedure: CYSTOSCOPy  STENT PLACEMENT;  Surgeon: Robley Fries, MD;  Location: [facility];  Service: Urology;  Laterality: Left;   CYSTOSCOPY WITH RETROGRADE PYELOGRAM, URETEROSCOPY AND STENT PLACEMENT Left 03/06/2019   Procedure: CYSTOSCOPY WITH RETROGRADE PYELOGRAM, URETEROSCOPY AND STENT PLACEMENT;  Surgeon: Robley Fries, MD;  Location: [facility];  Service: Urology;  Laterality: Left;  90 MINS   CYSTOSCOPY WITH STENT PLACEMENT Left 03/07/2019   Procedure: CYSTOSCOPY WITH , URETEROSCOPY/ STENT PLACEMENT;  Surgeon: Ardis Hughs, MD;  Location: [facility];  Service: Urology;  Laterality: Left;   DILATION AND CURETTAGE OF UTERUS     S/P miscarriage   ESOPHAGOGASTRODUODENOSCOPY N/A 11/17/2017   Procedure: ESOPHAGOGASTRODUODENOSCOPY (EGD);  Surgeon: Ronnette Juniper, MD;  Location: [facility];  Service: Gastroenterology;  Laterality: N/A;   HOLMIUM LASER APPLICATION Left 03/04/5807   Procedure: HOLMIUM LASER APPLICATION;  Surgeon: Robley Fries, MD;  Location: [facility];  Service: Urology;  Laterality: Left;   NASAL SEPTUM SURGERY     POLYPECTOMY  11/17/2017   Procedure: POLYPECTOMY;  Surgeon: Ronnette Juniper, MD;  Location: [facility];  Service: Gastroenterology;;   TONSILLECTOMY      Current Outpatient Medications  Medication Sig Dispense Refill   acetaminophen (TYLENOL) 650 MG CR tablet Take 650 mg by mouth 2 (two) times daily as needed for pain.     alendronate (FOSAMAX) 70 MG tablet Take 70 mg by mouth every Sunday. Take with a full glass of water on an empty stomach.     BESIVANCE 0.6 % SUSP Place 1 drop into the right eye 3 (three) times daily.     betamethasone dipropionate 0.05 % cream Apply 1 application topically 2 (two) times daily.     Biotin w/ Vitamins C & E (HAIR SKIN & NAILS GUMMIES PO) Take 2 tablets by  mouth daily.      blood glucose meter kit and supplies Dispense based on patient and insurance preference. Use up to four times daily as directed. (FOR ICD-10 E10.9, E11.9). 1 each 0   Calcium-Vitamin D-Vitamin K (CALCIUM + D + K PO) Take 1 tablet by mouth in the morning.     carvedilol (COREG) 3.125 MG tablet TAKE 1 TABLET TWICE DAILY  WITH MEALS 180 tablet 3   Difluprednate 0.05 % EMUL Place 1 drop into the right eye 4 (four) times daily.     diphenhydrAMINE-zinc acetate (BENADRYL) cream Apply 1 application topically 3 (three) times daily as needed for itching.     fluocinolone (SYNALAR) 0.01 % external solution Apply 1 application topically 2 (two) times daily as needed (skin irritation).     folic acid (FOLVITE) 983 MCG tablet Take 800 mcg by mouth daily.     furosemide (LASIX) 20 MG tablet TAKE 1 TABLET (20 MG) DAILY AND THEN 1 TABLET AS NEEDED DAILY FOR  SWELLING.... 180 tablet 3   gabapentin (NEURONTIN) 300 MG capsule Take 300 mg by mouth at bedtime.     HYDROcodone-acetaminophen (NORCO) 5-325 MG tablet Take 1 tablet by mouth every 4 (four) hours as needed for moderate pain (kidney stone pain). 20 tablet 0   Insulin Glargine (BASAGLAR KWIKPEN [place]) Inject 34 Units into the skin in the morning.     Insulin Pen Needle 31G X 5 MM MISC 12 Units by Does not apply route 3 (three) times daily. 100 each 0   Ipratropium-Albuterol (COMBIVENT RESPIMAT) 20-100 MCG/ACT AERS respimat Inhale 1 puff into the lungs every 6 (six) hours as needed for wheezing or shortness of breath. Use 3 times daily x 5 days, then every 6 hours as needed. 4 g 1   metFORMIN (GLUCOPHAGE) 500 MG tablet Take 500 mg by mouth 2 (two) times daily with a meal.     Misc Natural Products (GLUCOSAMINE CHONDROITIN TRIPLE) TABS Take 2 tablets by mouth daily.      Multiple Vitamins-Minerals (CENTRUM SILVER) CHEW Chew 2 tablets by mouth daily. soft chews     pantoprazole (PROTONIX) 40 MG tablet Take 1 tablet (40 mg total) by mouth daily. 90  tablet 1   Polyethyl Glycol-Propyl Glycol (SYSTANE) 0.4-0.3 % SOLN Place 1-2 drops into both eyes 3 (three) times daily as needed (dry/irritated eyes).     potassium chloride (KLOR-CON) 10 MEQ tablet TAKE 2 TABLETS BY MOUTH TWICE DAILY 360 tablet 1   PROLENSA 0.07 % SOLN SMARTSIG:1 Drop(s) Right Eye Every Evening     psyllium (METAMUCIL SMOOTH TEXTURE) 28 % packet Take 1 packet by mouth in the morning.     rivaroxaban (XARELTO) 20 MG TABS tablet Take 1 tablet (20 mg total) by mouth every evening. 90 tablet 3   simvastatin (ZOCOR) 10 MG tablet Take 1 tablet (10 mg total) by mouth at bedtime. 90 tablet 1   spironolactone (ALDACTONE) 25 MG tablet TAKE 1/2 TABLET (12.5MG    TOTAL) DAILY 45 tablet 2   triamcinolone cream (KENALOG) 0.1 % Apply 1 application topically 2 (two) times daily as needed (itching legs).      No current facility-administered medications for this encounter.    Allergies  Allergen Reactions   Diphenhydramine Other (See Comments)    heart issues   No [employer] Allergies     Defibrillator pads cause rash.    Social History   Socioeconomic History   Marital status: Unknown    Spouse name: Not on file   Number of children: Not on file   Years of education: Not on file   Highest education level: Not on file  Occupational History   Not on file  Tobacco Use   Smoking status: Never   Smokeless tobacco: Never  Vaping Use   Vaping Use: Never used  Substance and Sexual Activity   Alcohol use: Not Currently   Drug use: No   Sexual activity: Never  Other Topics Concern   Not on file  Social History Narrative   ** Merged History Encounter **       Social Determinants of Health   Financial Resource Strain: Not on file  Food Insecurity: Not on file  Transportation Needs: Not on file  Physical Activity: Not on file  Stress: Not on file  Social Connections: Not on file  Intimate Partner Violence: Not on file     ROS- All systems are reviewed and  negative except as per the HPI above.  Physical Exam: Vitals:  08/26/20 1128  BP: 132/78  Pulse: (!) 112  Weight: 130.2 kg  Height: 5' 2" (1.575 m)    GEN- The patient is a well appearing obese female, alert and oriented x 3 today.   HEENT-head normocephalic, atraumatic, sclera clear, conjunctiva pink, hearing intact, trachea midline. Lungs- Clear to ausculation bilaterally, normal work of breathing Heart- irregular rate and rhythm, no murmurs, rubs or gallops  GI- soft, NT, ND, + BS Extremities- no clubbing, cyanosis, or edema MS- no significant deformity or atrophy Skin- no rash or lesion Psych- euthymic mood, full affect Neuro- strength and sensation are intact   Wt Readings from Last 3 Encounters:  08/26/20 130.2 kg  07/29/20 128.4 kg  06/10/20 128.2 kg    EKG today demonstrates  Afib, LAFB Vent. rate 112 BPM PR interval * ms QRS duration 108 ms QT/QTcB 358/488 ms  Echo 07/18/20 demonstrated  1. Left ventricular ejection fraction, by estimation, is 50 to 55%. The  left ventricle has low normal function. The left ventricle has no regional wall motion abnormalities. Diastolic function indeterminant due to Afib.   2. Right ventricular systolic function is mildly reduced. The right  ventricular size is normal. Tricuspid regurgitation signal is inadequate for assessing PA pressure.   3. A small pericardial effusion is present. The pericardial effusion is circumferential and measures 0.85cm at end-diastole.   4. The mitral valve is normal in structure. Trivial mitral valve  regurgitation.   5. The aortic valve is tricuspid. There is mild calcification of the  aortic valve. There is mild thickening of the aortic valve. Aortic valve  regurgitation is trivial. Mild aortic valve sclerosis is present, with no  evidence of aortic valve stenosis.   6. The inferior vena cava is normal in size with greater than 50%  respiratory variability, suggesting right atrial pressure of 3  mmHg.   Comparison(s): Compared to prior TTE in 02/2019, the patient is now in Afib with LVEF 50-55% (previously 55-60%). There continues to be a small, circumferential pericardial effusion that is grossly unchanged in size.   Epic records are reviewed at length today  CHA2DS2-VASc Score = 5  The patient's score is based upon: CHF History: Yes HTN History: Yes Diabetes History: Yes Stroke History: No Vascular Disease History: No Age Score: 1 Gender Score: 1      ASSESSMENT AND PLAN: 1. Persistent Atrial Fibrillation (ICD10:  I48.19) The patient's CHA2DS2-VASc score is 5, indicating a 7.2% annual risk of stroke.  Patient presents for dofetilide admission. Patient aware of price of dofetilide. Continue Continue Xarelto 20 mg daily, states no missed doses in the last 3 weeks. No recent benadryl use PharmD has screened medications. Sotalol stopped 3 days ago. QTc in SR 447 ms Labs today show creatinine at 0.97, K+ 4.2 and mag 2.2, CrCl calculated at 108 mL/min Continue Coreg 6.25 mg BID   2. Secondary Hypercoagulable State (ICD10:  D68.69) The patient is at significant risk for stroke/thromboembolism based upon her CHA2DS2-VASc Score of 5.  Continue Rivaroxaban (Xarelto).   3. Obesity Body mass index is 52.49 kg/m. Lifestyle modification was discussed and encouraged including regular physical activity and weight reduction.  4. Obstructive sleep apnea Patient reports compliance with CPAP therapy. Followed by Dr Radford Pax.  5. HTN Stable, no med changes today.  6. Chronic diastolic CHF No signs or symptoms of fluid overload today. She is on scheduled diuretics. Could consider checking NT-proBNP during admission to see if she would be a candidate for Alleviate-HF trial.  To be admitted later today once a bed becomes available.    [facility] [address] [place], [place] [address] [phone] 08/26/2020 11:54 AM

## 2020-08-26 NOTE — Progress Notes (Signed)
Pt set up with CPAP attached patients nasal prong mask.  Pt tolerating well.

## 2020-08-27 LAB — BRAIN NATRIURETIC PEPTIDE: B Natriuretic Peptide: 97.1 pg/mL (ref 0.0–100.0)

## 2020-08-27 LAB — BASIC METABOLIC PANEL
Anion gap: 8 (ref 5–15)
BUN: 15 mg/dL (ref 8–23)
CO2: 25 mmol/L (ref 22–32)
Calcium: 9.3 mg/dL (ref 8.9–10.3)
Chloride: 104 mmol/L (ref 98–111)
Creatinine, Ser: 0.96 mg/dL (ref 0.44–1.00)
GFR, Estimated: >60 mL/min (ref >60)
Glucose, Bld: 164 mg/dL — ABNORMAL HIGH (ref 70–99)
Potassium: 3.9 mmol/L (ref 3.5–5.1)
Sodium: 137 mmol/L (ref 135–145)

## 2020-08-27 LAB — MAGNESIUM: Magnesium: 2.2 mg/dL (ref 1.7–2.4)

## 2020-08-27 MED ORDER — GABAPENTIN 300 MG PO CAPS
300.0000 mg | ORAL_CAPSULE | Freq: Every day | ORAL | Status: DC
  Administered 2020-08-27 – 2020-08-28 (×3): 300 mg via ORAL
  Filled 2020-08-27 (×3): qty 1

## 2020-08-27 MED ORDER — FUROSEMIDE 10 MG/ML IJ SOLN
40.0000 mg | Freq: Once | INTRAMUSCULAR | Status: AC
  Administered 2020-08-27: 40 mg via INTRAVENOUS
  Filled 2020-08-27: qty 4

## 2020-08-27 MED ORDER — HYDROCODONE-ACETAMINOPHEN 5-325 MG PO TABS
1.0000 | ORAL_TABLET | Freq: Four times a day (QID) | ORAL | Status: AC | PRN
  Administered 2020-08-27 (×2): 1 via ORAL
  Filled 2020-08-27 (×2): qty 1

## 2020-08-27 MED ORDER — POTASSIUM CHLORIDE CRYS ER 20 MEQ PO TBCR
40.0000 meq | EXTENDED_RELEASE_TABLET | Freq: Once | ORAL | Status: AC
  Administered 2020-08-27: 40 meq via ORAL
  Filled 2020-08-27: qty 2

## 2020-08-27 NOTE — Progress Notes (Addendum)
Morning EKG reviewed  Shows pt remains in afib at 111 bpm with borderline QTc   By Bazett pt has QTc of 490 ms if QT 360 ;  QTc of 403 if QT 370  By Encarnacion Chu which corrects more appropriately at faster rates, her QTc is between 440-460 with measurements above.   Given her h/o of QT prolongation on sotalol. I will discuss with Dr. Caryl Comes prior to deciding evening dose.   Possible dose adjustment pending MD review.   Shirley Friar, PA-C  Pager: [phone]  08/27/2020 11:21 AM   ADDENDUM:  Discussed with Dr. Caryl Comes. With elevated rates will go by Fridericia algorithm and continue current dose of tikosyn at 500 mcg BID.   Legrand Como [address]" [place], PA-C  08/27/2020 11:35 AM

## 2020-08-27 NOTE — Care Management (Addendum)
1644 08-27-20 Case Manager discussed cost of $109.34 with patient. Patient states she is agreeable to first fill here at the [facility] and then Rx refills for 90 day supply to either be electronically sent to [facility] or faxed to [phone]. Patient states a 90 day supply with [facility] will cost $45.00. No further needs at this time.

## 2020-08-27 NOTE — Progress Notes (Addendum)
Pharmacy: Dofetilide (Tikosyn) - Follow Up Assessment and Electrolyte Replacement  Pharmacy consulted to assist in monitoring and replacing electrolytes in this 75 y.o. female admitted on 08/26/2020 undergoing dofetilide initiation. First dofetilide dose: 7/19 pm  Labs:    Component Value Date/Time   K 3.9 08/27/2020 0041   MG 2.2 08/27/2020 0041     Plan: Potassium: K 3.8-3.9:  Give KCl 40 mEq po x1 -already ordered. Also on K 20 mEq BID  Magnesium: Mg > 2: No additional supplementation needed   Thank you for allowing pharmacy to participate in this patient's care   Benetta Spar, PharmD, BCPS, [facility] Clinical Pharmacist  Please check AMION for all [place] phone numbers After 10:00 PM, call [place] [phone]

## 2020-08-27 NOTE — Progress Notes (Signed)
Patient noted converted to Sinus Rhythm on the monitor at 1704. Obtained EKG and placed on the chart/ Leveda Anna, BSN, RN

## 2020-08-27 NOTE — TOC Benefit Eligibility Note (Signed)
Transition of Care [facility]) Benefit Eligibility Note    Patient Details  Name: REMMINGTON TETERS MRN: [id number] Date of Birth: [birth year]   Medication/Dose: DOFETILIDE 500 MCG BID   CO-PAY- $109.34       250 MCG BID  CO-PAY -$125.64       125 MCG BID CO-PAY- $107.19     CAPSULE  Covered?: Yes  Tier:  (TIER- 4 DRUG)  Prescription Coverage [facility]erred Pharmacy: [facility]  Spoke with Person/Company/Phone Number:: [employer]  @ [place] AP #  [phone]  Co-Pay: $ 109.34  Prior Approval: No  Deductible: Met  Additional Notes: TIKOSYN :  NOT COVER / [place] # [phone]    Memory Argue Phone Number: 08/27/2020, 9:37 AM

## 2020-08-27 NOTE — Progress Notes (Addendum)
Electrophysiology Rounding Note  Patient Name: Karen Rasmussen Date of Encounter: 08/27/2020  Primary Cardiologist: Fransico Him, MD  Electrophysiologist: None    Subjective   Pt remains in afib on Tikosyn 500 mcg BID   QTc from EKG last pm shows stable QTc at ~470-480  The patient is doing well today.  At this time, the patient denies chest pain, shortness of breath, or any new concerns.  SOB last night while sleeping   Inpatient Medications    Scheduled Meds:  calcium-vitamin D  1 tablet Oral q AM   carvedilol  6.25 mg Oral BID WC   Difluprednate  1 drop Right Eye QID   dofetilide  500 mcg Oral BID   folic acid  1 mg Oral Daily   furosemide  20 mg Oral Daily   gabapentin  300 mg Oral QHS   gatifloxacin  1 drop Both Eyes QID   insulin glargine  34 Units Subcutaneous q AM   ketorolac  1 drop Right Eye QHS   metFORMIN  500 mg Oral BID WC   multivitamin with minerals  1 tablet Oral Daily   pantoprazole  40 mg Oral Daily   potassium chloride  20 mEq Oral BID   psyllium  1 packet Oral q AM   rivaroxaban  20 mg Oral Q supper   simvastatin  10 mg Oral QHS   sodium chloride flush  3 mL Intravenous Q12H   spironolactone  12.5 mg Oral Daily   Continuous Infusions:  sodium chloride     PRN Meds: sodium chloride, HYDROcodone-acetaminophen, ipratropium-albuterol, polyvinyl alcohol, sodium chloride flush   Vital Signs    Vitals:   08/26/20 1440 08/26/20 1933 08/26/20 2304 08/27/20 0439  BP: (!) 130/97 (!) 132/91  105/70  Pulse: 91 91 96 100  Resp: 18 16 15 17   Temp: 97.8 F (36.6 C) 97.7 F (36.5 C)  97.7 F (36.5 C)  TempSrc: Oral Oral  Oral  SpO2: 96% 98% 96% 99%  Weight: 129.6 kg     Height: 5\' 2"  (1.575 m)       Intake/Output Summary (Last 24 hours) at 08/27/2020 0729 Last data filed at 08/26/2020 2000 Gross per 24 hour  Intake 240 ml  Output --  Net 240 ml   Filed Weights   08/26/20 1440  Weight: 129.6 kg    Physical Exam    GEN- The patient is  well appearing, alert and oriented x 3 today.   Head- normocephalic, atraumatic Eyes-  Sclera clear, conjunctiva pink Ears- hearing intact JVP elevated  Neck- supple Lungs- Clear to ausculation bilaterally, normal work of breathing Heart- Irregularly irregular rate and rhythm, no murmurs, rubs or gallops GI- soft, NT, ND, + BS Extremities- no clubbing, cyanosis  tr edema Skin- no rash or lesion Psych- euthymic mood, full affect Neuro- strength and sensation are intact  Labs    CBC No results for input(s): WBC, NEUTROABS, HGB, HCT, MCV, PLT in the last 72 hours. Basic Metabolic Panel Recent Labs    08/26/20 1134 08/27/20 0041  NA 138 137  K 4.2 3.9  CL 104 104  CO2 26 25  GLUCOSE 169* 164*  BUN 16 15  CREATININE 0.97 0.96  CALCIUM 9.5 9.3  MG 2.2 2.2    Potassium  Date/Time Value Ref Range Status  08/27/2020 12:41 AM 3.9 3.5 - 5.1 mmol/L Final   Magnesium  Date/Time Value Ref Range Status  08/27/2020 12:41 AM 2.2 1.7 - 2.4 mg/dL Final  Comment:    Performed at [facility], [place] [address], [place], [place] [address]    Telemetry    AF RVR 90-120s (personally reviewed)  Radiology    No results found.   Patient Profile     Karen Rasmussen is a 75 y.o. female with a past medical history significant for persistent atrial fibrillation.  They were admitted for tikosyn load.   Assessment & Plan    Persistent atrial fibrillation Pt remains in afib on Tikosyn 500 mcg BID  Continue Xarelto Electrolytes stable. K 3.9 will supp gently  CHA2DS2/VAS Stroke Risk Points  Current as of 51 minutes ago    5 >= 2 Points: High Risk 1 - 1.99 Points: Medium Risk 0 Points: Low Risk   Last Change: N/A    Details   This score determines the patient's risk of having a stroke if the patient has atrial fibrillation.    Points Metrics 1 Has Congestive Heart Failure:  Yes   Current as of 51 minutes ago 0 Has Vascular Disease:  No   Current as of 51 minutes ago 1 Has Hypertension:   Yes   Current as of 51 minutes ago 1 Age:  21   Current as of 51 minutes ago 1 Has Diabetes:  Yes   Current as of 51 minutes ago 0 Had Stroke:  No  Had TIA:  No  Had Thromboembolism:  No   Current as of 51 minutes ago 1 Female:  Yes   Current as of 51 minutes ago       2. H/o QT prolongation on higher doses of sotalol Only tolerated 80 mg BID. Follow QT closely.   3. OSA on CPAP She has an appointment with Dr. Radford Pax (virtual) on Friday that she has to keep per Medicare.  4. Chronic diastolic CHF/acute  Will give dose of IV lasix this am and follow volume status Consider for Alleviate HF  If pt does not convert chemically, plan on DCCV tomorrow    For questions or updates, please contact [place] Please consult [email or web address] for contact info under Cardiology/STEMI.  Signed, Shirley Friar, PA-C  08/27/2020, 7:29 AM   Tolerating dofetilide thus far Reviewed potential risks and benefits and crafted expectations about drug assoc success   Dyspnea with recumbency, volume overloaded will give IV lasix

## 2020-08-28 ENCOUNTER — Encounter (HOSPITAL_COMMUNITY)
Admission: AD | Disposition: A | Discharge status: Home / Self Care | Payer: Self-pay | Source: Ambulatory Visit | Attending: Cardiology

## 2020-08-28 LAB — BASIC METABOLIC PANEL
Anion gap: 10 (ref 5–15)
BUN: 18 mg/dL (ref 8–23)
CO2: 26 mmol/L (ref 22–32)
Calcium: 9.2 mg/dL (ref 8.9–10.3)
Chloride: 101 mmol/L (ref 98–111)
Creatinine, Ser: 1.11 mg/dL — ABNORMAL HIGH (ref 0.44–1.00)
GFR, Estimated: 53 mL/min — ABNORMAL LOW (ref >60)
Glucose, Bld: 137 mg/dL — ABNORMAL HIGH (ref 70–99)
Potassium: 4 mmol/L (ref 3.5–5.1)
Sodium: 137 mmol/L (ref 135–145)

## 2020-08-28 LAB — MAGNESIUM: Magnesium: 2.2 mg/dL (ref 1.7–2.4)

## 2020-08-28 SURGERY — CARDIOVERSION
Anesthesia: General

## 2020-08-28 MED ORDER — HYDROCODONE-ACETAMINOPHEN 5-325 MG PO TABS
1.0000 | ORAL_TABLET | Freq: Every evening | ORAL | Status: DC | PRN
Start: 2020-08-28 — End: 2020-08-29
  Administered 2020-08-28: 1 via ORAL
  Filled 2020-08-28: qty 1

## 2020-08-28 NOTE — Progress Notes (Addendum)
Electrophysiology Rounding Note  Patient Name: Karen Rasmussen Date of Encounter: 08/28/2020  Primary Cardiologist: Fransico Him, MD  Electrophysiologist: None    Subjective   Pt converted to sinus rhythm on Tikosyn 500 mcg BID   QTc from EKG last pm shows stable QTc at ~470 ms  The patient is doing well today.  At this time, the patient denies chest pain, shortness of breath, or any new concerns.  Inpatient Medications    Scheduled Meds:  calcium-vitamin D  1 tablet Oral q AM   carvedilol  6.25 mg Oral BID WC   dofetilide  500 mcg Oral BID   folic acid  1 mg Oral Daily   gabapentin  300 mg Oral QHS   gatifloxacin  1 drop Both Eyes QID   insulin glargine  34 Units Subcutaneous q AM   ketorolac  1 drop Right Eye QHS   metFORMIN  500 mg Oral BID WC   multivitamin with minerals  1 tablet Oral Daily   pantoprazole  40 mg Oral Daily   potassium chloride  20 mEq Oral BID   psyllium  1 packet Oral q AM   rivaroxaban  20 mg Oral Q supper   simvastatin  10 mg Oral QHS   sodium chloride flush  3 mL Intravenous Q12H   spironolactone  12.5 mg Oral Daily   Continuous Infusions:  sodium chloride     PRN Meds: sodium chloride, ipratropium-albuterol, polyvinyl alcohol, sodium chloride flush   Vital Signs    Vitals:   08/27/20 0439 08/27/20 0836 08/27/20 2036 08/28/20 0549  BP: 105/70 (!) 123/101 123/61 107/66  Pulse: 100 84 67 (!) 59  Resp: 17 18 17 17   Temp: 97.7 F (36.5 C) 98.4 F (36.9 C) 98.3 F (36.8 C) (!) 97.5 F (36.4 C)  TempSrc: Oral Oral Oral Oral  SpO2: 99% 99% 100% 100%  Weight:      Height:        Intake/Output Summary (Last 24 hours) at 08/28/2020 0753 Last data filed at 08/28/2020 0555 Gross per 24 hour  Intake 240 ml  Output 3950 ml  Net -3710 ml   Filed Weights   08/26/20 1440  Weight: 129.6 kg    Physical Exam    GEN- The patient is well appearing, alert and oriented x 3 today.   Head- normocephalic, atraumatic Eyes-  Sclera clear,  conjunctiva pink Ears- hearing intact Oropharynx- clear Neck- supple Lungs- Clear to ausculation bilaterally, normal work of breathing Heart- Regular rate and rhythm, no murmurs, rubs or gallops GI- soft, NT, ND, + BS Extremities- no clubbing, cyanosis, or edema Skin- no rash or lesion Psych- euthymic mood, full affect Neuro- strength and sensation are intact  Labs    CBC No results for input(s): WBC, NEUTROABS, HGB, HCT, MCV, PLT in the last 72 hours. Basic Metabolic Panel Recent Labs    08/27/20 0041 08/28/20 0357  NA 137 137  K 3.9 4.0  CL 104 101  CO2 25 26  GLUCOSE 164* 137*  BUN 15 18  CREATININE 0.96 1.11*  CALCIUM 9.3 9.2  MG 2.2 2.2    Potassium  Date/Time Value Ref Range Status  08/28/2020 03:57 AM 4.0 3.5 - 5.1 mmol/L Final   Magnesium  Date/Time Value Ref Range Status  08/28/2020 03:57 AM 2.2 1.7 - 2.4 mg/dL Final    Comment:    Performed at [facility], [place] [address] [place],  [address]    Telemetry  NSR 60-80s (personally reviewed)  Radiology    No results found.   Patient Profile     Karen Rasmussen is a 75 y.o. female with a past medical history significant for persistent atrial fibrillation.  They were admitted for tikosyn load.   Assessment & Plan    Persistent atrial fibrillation Pt converted to sinus rhythm on Tikosyn 500 mcg BID  Continue Xarelto K 4.0 and Mg 2.2 CHA2DS2VASC is at least 5.  2. H/o QT prolongation on higher doses of sotalol Only tolerated 80 mg BID. Follow QT closely.   3. OSA on CPAP She has an appointment with Dr. Radford Pax (virtual) on Friday that she has to keep per Medicare. Will plan discharge after if QT remains stable.    4. Chronic diastolic CHF Volume status OK with dose of IV lasix yesterday.  Consider for Alleviate HF  She converted to NSR after 2 doses of tikosyn and will note require [facility]. Home tomorrow if QTc remains stable.    For questions or updates, please contact [place] Please consult [email or web address] for contact info under Cardiology/STEMI.  Signed, Shirley Friar, PA-C  08/28/2020, 7:53 AM   In sinus   feels better QTc within normal range somewhat surprisingly given response to sotalol Braeathing better

## 2020-08-28 NOTE — Progress Notes (Signed)
Morning EKG reviewed  Shows remains in NSR at 61 bpm with stable QTc at 490 ms.  Continue Tikosyn 500 mcg BID.   Tentatively home in am if QTc remains stable.     Shirley Friar, PA-C  Pager: [phone]  08/28/2020 11:32 AM

## 2020-08-28 NOTE — Progress Notes (Signed)
Pharmacy: Dofetilide (Tikosyn) - Follow Up Assessment and Electrolyte Replacement  Pharmacy consulted to assist in monitoring and replacing electrolytes in this 75 y.o. female admitted on 08/26/2020 undergoing dofetilide initiation.   Labs:    Component Value Date/Time   K 4.0 08/28/2020 0357   MG 2.2 08/28/2020 0357     Plan: Potassium: K >/= 4: No additional supplementation needed  Magnesium: Mg > 2: No additional supplementation needed   Recommend discharging patient with prescription for:  Potassium chloride 20 mEq  twice daily  Thank you for allowing pharmacy to participate in this patient's care   Hildred Laser, PharmD Clinical Pharmacist **Pharmacist phone directory can now be found on [email or web address] (PW [email or web address]).  Listed under [place].

## 2020-08-29 ENCOUNTER — Encounter: Payer: Medicare Other | Admitting: Cardiology

## 2020-08-29 ENCOUNTER — Other Ambulatory Visit (HOSPITAL_COMMUNITY): Payer: Self-pay

## 2020-08-29 ENCOUNTER — Encounter: Payer: Self-pay | Admitting: Cardiology

## 2020-08-29 VITALS — BP 120/61 | HR 63 | Ht 62.0 in | Wt 282.0 lb

## 2020-08-29 LAB — BASIC METABOLIC PANEL
Anion gap: 8 (ref 5–15)
BUN: 20 mg/dL (ref 8–23)
CO2: 25 mmol/L (ref 22–32)
Calcium: 9.3 mg/dL (ref 8.9–10.3)
Chloride: 105 mmol/L (ref 98–111)
Creatinine, Ser: 0.99 mg/dL (ref 0.44–1.00)
GFR, Estimated: >60 mL/min (ref >60)
Glucose, Bld: 123 mg/dL — ABNORMAL HIGH (ref 70–99)
Potassium: 4.2 mmol/L (ref 3.5–5.1)
Sodium: 138 mmol/L (ref 135–145)

## 2020-08-29 LAB — MAGNESIUM: Magnesium: 2.2 mg/dL (ref 1.7–2.4)

## 2020-08-29 MED ORDER — DOFETILIDE 500 MCG PO CAPS
500.0000 ug | ORAL_CAPSULE | Freq: Two times a day (BID) | ORAL | 6 refills | Status: DC
  Filled 2020-08-29: qty 60, 30d supply, fill #0

## 2020-08-29 MED ORDER — COVID-19 MRNA VAC-TRIS(PFIZER) 30 MCG/0.3ML IM SUSP
0.3000 mL | Freq: Once | INTRAMUSCULAR | Status: AC
  Administered 2020-08-29: 0.3 mL via INTRAMUSCULAR
  Filled 2020-08-29: qty 0.3

## 2020-08-29 MED ORDER — FUROSEMIDE 20 MG PO TABS: ORAL_TABLET | ORAL | 3 refills | Status: DC

## 2020-08-29 MED ORDER — SPIRONOLACTONE 25 MG PO TABS: ORAL_TABLET | ORAL | Status: DC

## 2020-08-29 NOTE — Discharge Summary (Addendum)
ELECTROPHYSIOLOGY PROCEDURE DISCHARGE SUMMARY    Patient ID: Karen Rasmussen,  MRN: [id number], DOB/AGE: 07/07/1945 75 y.o.  Admit date: 08/26/2020 Discharge date: 08/29/2020  Primary Care Physician: Kathyrn Lass, MD  Primary Cardiologist: Fransico Him, MD  Electrophysiologist: Dr. Caryl Comes  Primary Discharge Diagnosis:  1.  Persistent atrial fibrillation status post Tikosyn loading this admission  Secondary Discharge Diagnosis:  2. H/o QT prolongation on sotalol 3. OSA on CPAP 4. Chronic diastolic CHF  Allergies  Allergen Reactions   Diphenhydramine Other (See Comments)    heart issues   No [employer] Food Allergies     Defibrillator pads cause rash.    Procedures This Admission:  1.  Tikosyn loading  Brief HPI: Karen Rasmussen is a 75 y.o. female with a past medical history as noted above.  They were referred to EP in the outpatient setting for treatment options of atrial fibrillation.  Risks, benefits, and alternatives to Tikosyn were reviewed with the patient who wished to proceed.    Hospital Course:  The patient was admitted and Tikosyn was initiated.  Renal function and electrolytes were followed during the hospitalization.  Their QTc remained stable.  She converted to NSR on tikosyn and did not require cardioversion which restored sinus rhythm.  They were monitored until discharge on telemetry which demonstrated NSR. She was mildly volume overloaded and received a dose of IV lasix. She will change lasix to 40 mg every OTHER day chronically. She is interested in Alleviate HF study and will be contacted by research.  On the day of discharge, they were examined by Dr. Caryl Comes who considered them stable for discharge to home.  Her QT was borderline after morning dose, so will check EKG on Monday. Follow-up has otherwise been arranged with the Atrial Fibrillation clinic in approximately 1 week and with Dr. Caryl Comes  in 4 weeks.   Physical Exam: Vitals:   08/28/20 1946 08/29/20  0532 08/29/20 0843 08/29/20 1100  BP: 116/60 (!) 119/58 (!) 135/54 (!) 144/52  Pulse: 68 (!) 59 61 (!) 58  Resp: _0 Temp: 98.1 F (36.7 C) 97.6 F (36.4 C)  98.2 F (36.8 C)  TempSrc: Oral Oral  Oral  SpO2: 98% 97%  99%  Weight:      Height:        GEN- The patient is well appearing, alert and oriented x 3 today.   HEENT: normocephalic, atraumatic; sclera clear, conjunctiva pink; hearing intact; oropharynx clear; neck supple, no JVP Lymph- no cervical lymphadenopathy Lungs- Clear to ausculation bilaterally, normal work of breathing.  No wheezes, rales, rhonchi Heart- Regular rate and rhythm, no murmurs, rubs or gallops, PMI not laterally displaced GI- soft, non-tender, non-distended, bowel sounds present, no hepatosplenomegaly Extremities- no clubbing, cyanosis, or edema; DP/PT/radial pulses 2+ bilaterally MS- no significant deformity or atrophy Skin- warm and dry, no rash or lesion Psych- euthymic mood, full affect Neuro- strength and sensation are intact   Labs:   Lab Results  Component Value Date   WBC 7.6 05/21/2020   HGB 14.6 06/03/2020   HCT 43.0 06/03/2020   MCV 90.7 05/21/2020   PLT 265 05/21/2020    Recent Labs  Lab 08/29/20 0206  NA 138  K 4.2  CL 105  CO2 25  BUN 20  CREATININE 0.99  CALCIUM 9.3  GLUCOSE 123*     Discharge Medications:  Allergies as of 08/29/2020       Reactions   Diphenhydramine Other (See Comments)  heart issues   No [employer] Food Allergies    Defibrillator pads cause rash.        Medication List     TAKE these medications    acetaminophen 650 MG CR tablet Commonly known as: TYLENOL Take 650 mg by mouth 2 (two) times daily as needed for pain.   alendronate 70 MG tablet Commonly known as: FOSAMAX Take 70 mg by mouth every Sunday. Take with a full glass of water on an empty stomach.   BASAGLAR KWIKPEN [place] Inject 34 Units into the skin in the morning.   Besivance 0.6 % Susp Generic drug: Besifloxacin  HCl Place 1 drop into the right eye 3 (three) times daily.   betamethasone dipropionate 0.05 % cream Apply 1 application topically 2 (two) times daily.   blood glucose meter kit and supplies Dispense based on patient and insurance preference. Use up to four times daily as directed. (FOR ICD-10 E10.9, E11.9).   CALCIUM + D + K PO Take 1 tablet by mouth in the morning.   carvedilol 3.125 MG tablet Commonly known as: COREG TAKE 1 TABLET TWICE DAILY  WITH MEALS   Centrum Silver Chew Chew 2 tablets by mouth daily. soft chews   Combivent Respimat 20-100 MCG/ACT Aers respimat Generic drug: Ipratropium-Albuterol Inhale 1 puff into the lungs every 6 (six) hours as needed for wheezing or shortness of breath. Use 3 times daily x 5 days, then every 6 hours as needed.   Difluprednate 0.05 % Emul Place 1 drop into the right eye 4 (four) times daily.   diphenhydrAMINE-zinc acetate cream Commonly known as: BENADRYL Apply 1 application topically 3 (three) times daily as needed for itching.   dofetilide 500 MCG capsule Commonly known as: TIKOSYN Take 1 capsule (500 mcg total) by mouth 2 (two) times daily.   fluocinolone 0.01 % external solution Commonly known as: SYNALAR Apply 1 application topically 2 (two) times daily as needed (skin irritation).   folic acid 295 MCG tablet Commonly known as: FOLVITE Take 800 mcg by mouth daily.   furosemide 20 MG tablet Commonly known as: LASIX TAKE 2 TABLET (40 MG) EVERY OTHER DAY What changed: additional instructions   gabapentin 300 MG capsule Commonly known as: NEURONTIN Take 300 mg by mouth at bedtime.   Glucosamine Chondroitin Triple Tabs Take 2 tablets by mouth daily.   HAIR SKIN & NAILS GUMMIES PO Take 2 tablets by mouth daily.   HYDROcodone-acetaminophen 5-325 MG tablet Commonly known as: Norco Take 1 tablet by mouth every 4 (four) hours as needed for moderate pain (kidney stone pain).   Insulin Pen Needle 31G X 5 MM Misc 12  Units by Does not apply route 3 (three) times daily.   metFORMIN 500 MG tablet Commonly known as: GLUCOPHAGE Take 500 mg by mouth 2 (two) times daily with a meal.   pantoprazole 40 MG tablet Commonly known as: PROTONIX Take 1 tablet (40 mg total) by mouth daily.   potassium chloride 10 MEQ tablet Commonly known as: KLOR-CON TAKE 2 TABLETS BY MOUTH TWICE DAILY   Prolensa 0.07 % Soln Generic drug: Bromfenac Sodium Place 1 drop into the right eye every evening.   psyllium 28 % packet Commonly known as: METAMUCIL SMOOTH TEXTURE Take 1 packet by mouth in the morning.   rivaroxaban 20 MG Tabs tablet Commonly known as: Xarelto Take 1 tablet (20 mg total) by mouth every evening.   simvastatin 10 MG tablet Commonly known as: ZOCOR Take 1 tablet (10 mg total)  by mouth at bedtime.   spironolactone 25 MG tablet Commonly known as: ALDACTONE TAKE 1/2 TABLET (12.5MG    TOTAL) DAILY   Systane 0.4-0.3 % Soln Generic drug: Polyethyl Glycol-Propyl Glycol Place 1-2 drops into both eyes 3 (three) times daily as needed (dry/irritated eyes).   triamcinolone cream 0.1 % Commonly known as: KENALOG Apply 1 application topically 2 (two) times daily as needed (itching legs).        Disposition:    Follow-up Information     [facility] Follow up.   Specialty: Cardiology Why: on 8/1 at 215 for 1 week post tikosyn follow up Contact information: [address] [id number] [place] [address] [phone]        Shirley Friar, PA-C Follow up.   Specialty: Physician Assistant Why: on 7/25 at 1130 to noon for an EKG Contact information: [place] [address] [phone]                 Duration of Discharge Encounter: Greater than 30 minutes including physician time.  Jacalyn Lefevre, PA-C  08/29/2020 12:27 PM

## 2020-08-29 NOTE — Progress Notes (Signed)
Electrophysiology Rounding Note  Patient Name: Karen Rasmussen Date of Encounter: 08/29/2020  Primary Cardiologist: Fransico Him, MD  Electrophysiologist: None    Subjective   Holding sinus on doefitlide   Walking with less dyspnea; no orthopnea    Inpatient Medications    Scheduled Meds:  calcium-vitamin D  1 tablet Oral q AM   carvedilol  6.25 mg Oral BID WC   COVID-19 mRNA Vac-TriS (Pf[employer])  0.3 mL Intramuscular Once   dofetilide  500 mcg Oral BID   folic acid  1 mg Oral Daily   gabapentin  300 mg Oral QHS   gatifloxacin  1 drop Both Eyes QID   insulin glargine  34 Units Subcutaneous q AM   ketorolac  1 drop Right Eye QHS   metFORMIN  500 mg Oral BID WC   multivitamin with minerals  1 tablet Oral Daily   pantoprazole  40 mg Oral Daily   potassium chloride  20 mEq Oral BID   psyllium  1 packet Oral q AM   rivaroxaban  20 mg Oral Q supper   simvastatin  10 mg Oral QHS   sodium chloride flush  3 mL Intravenous Q12H   spironolactone  12.5 mg Oral Daily   Continuous Infusions:  sodium chloride     PRN Meds: sodium chloride, HYDROcodone-acetaminophen, ipratropium-albuterol, polyvinyl alcohol, sodium chloride flush   Vital Signs    Vitals:   08/28/20 1106 08/28/20 1946 08/29/20 0532 08/29/20 0843  BP: 130/61 116/60 (!) 119/58 (!) 135/54  Pulse: 61 68 (!) 59 61  Resp: '14 16 16   '$ Temp: 98.8 F (37.1 C) 98.1 F (36.7 C) 97.6 F (36.4 C)   TempSrc: Oral Oral Oral   SpO2: 97% 98% 97%   Weight:      Height:        Intake/Output Summary (Last 24 hours) at 08/29/2020 1005 Last data filed at 08/28/2020 1107 Gross per 24 hour  Intake 237 ml  Output 400 ml  Net -163 ml    Filed Weights   08/26/20 1440  Weight: 129.6 kg    Physical Exam  Well developed and nourished in no acute distress HENT normal Neck supple  Clear Regular rate and rhythm, no murmurs or gallops Abd-soft with active BS No Clubbing cyanosis edema Skin-warm and dry A & Oriented   Grossly normal sensory and motor function    Labs    CBC No results for input(s): WBC, NEUTROABS, HGB, HCT, MCV, PLT in the last 72 hours. Basic Metabolic Panel Recent Labs    08/28/20 0357 08/29/20 0206  NA 137 138  K 4.0 4.2  CL 101 105  CO2 26 25  GLUCOSE 137* 123*  BUN 18 20  CREATININE 1.11* 0.99  CALCIUM 9.2 9.3  MG 2.2 2.2     Potassium  Date/Time Value Ref Range Status  08/29/2020 02:06 AM 4.2 3.5 - 5.1 mmol/L Final   Magnesium  Date/Time Value Ref Range Status  08/29/2020 02:06 AM 2.2 1.7 - 2.4 mg/dL Final    Comment:    Performed at [facility], [place] [address] [place], [place] [address]    Telemetry    Tel NSR Personally reviewed    Radiology    No results found.   Patient Profile     Karen Rasmussen is a 75 y.o. female with a past medical history significant for persistent atrial fibrillation.  They were admitted for tikosyn load.   Assessment & Plan  Persistent atrial fibrillation    2. H/o QT prolongation on higher doses of sotalol   3. OSA on CPAP   4. Chronic diastolic CHF     Euvolemic will plan discharge on furosemide 40 QOD  QT ok   Will need close surveillance of her QT interval, I am surprised that she has tolerated the 500 mcg given her response to sotalol  Discussed enrollment in Alleviate HF  she is interested

## 2020-08-29 NOTE — Progress Notes (Signed)
7/21 evening EKG reviewed  Shows remains in NSR at 67 bpm with stable QTc at ~480 ms.  Continue Tikosyn 500 mcg BID.   Plan for home this afternoon if QT remains stable. Pt interested in Alleviate HF study. Will have research reach out.   Pt also asked for COVID booster while here. Will order.    Declines lasix this am so she can "make it home" without having to stop.   Full note pending disposition.  Shirley Friar, PA-C  Pager: [phone]  08/29/2020 7:44 AM

## 2020-08-29 NOTE — Progress Notes (Signed)
Pharmacy: Dofetilide (Tikosyn) - Follow Up Assessment and Electrolyte Replacement  Pharmacy consulted to assist in monitoring and replacing electrolytes in this 75 y.o. female admitted on 08/26/2020 undergoing dofetilide initiation.   Labs:    Component Value Date/Time   K 4.2 08/29/2020 0206   MG 2.2 08/29/2020 0206     Plan: Potassium: K >/= 4: No additional supplementation needed  Magnesium: Mg > 2: No additional supplementation needed   Recommend discharging patient with home prescription for:  Potassium chloride 20 mEq  twice daily  Thank you for involving pharmacy in this patient's care.  Elita Quick, PharmD PGY1 Ambulatory Care Pharmacy Resident 08/29/2020 7:58 AM  **Pharmacist phone directory can be found on [email or web address] listed under [place]**

## 2020-08-29 NOTE — Progress Notes (Signed)
This encounter was created in error - please disregard.

## 2020-09-01 ENCOUNTER — Other Ambulatory Visit: Payer: Self-pay

## 2020-09-01 ENCOUNTER — Ambulatory Visit (INDEPENDENT_AMBULATORY_CARE_PROVIDER_SITE_OTHER): Payer: Medicare Other | Admitting: Student

## 2020-09-01 ENCOUNTER — Telehealth: Payer: Self-pay | Admitting: Student

## 2020-09-01 DIAGNOSIS — I4819 Other persistent atrial fibrillation: Secondary | ICD-10-CM | POA: Diagnosis not present

## 2020-09-01 NOTE — Progress Notes (Addendum)
RN visit for EKG only with borderline QT on discharge on tikosyn.   EKG today shows maintaining NSR at 74 bpm with QTc appearing more in the 470-490 range by manual measurement.  Even if borderline it is less obviously long than on discharge.   Will plan to continue 500 mcg dose for now and keep 1 week f/u with AF clinic. Will review EKG with Dr. Caryl Comes as well.   Legrand Como [address] [place], PA-C  09/01/2020 12:22 PM   ADDENDUM  Pt has been recommended for ALLEVIATE-HF after recent admission in the setting of NYHA II-III symptoms that improved with a dose of IV lasix.   Legrand Como [address]" [place], PA-C  10/17/2020 3:05 PM

## 2020-09-01 NOTE — Telephone Encounter (Signed)
Pt Is reaching out to inform us that her next refill of  dofetilide (TIKOSYN) 500 MCG capsule Should be sent over to:  [facility]  Office hours:  M-F: 8am to 5pm EST Sa: 9am to 5pm EST Su: Closed  [address] [place], [place] [address]  Telephone [phone]  Fax [phone]

## 2020-09-01 NOTE — Telephone Encounter (Signed)
Her new pharmacy was added to pt's chart. Pt had recent refill on this medication.

## 2020-09-02 DIAGNOSIS — B349 Viral infection, unspecified: Secondary | ICD-10-CM | POA: Diagnosis not present

## 2020-09-04 ENCOUNTER — Telehealth: Payer: Self-pay | Admitting: Cardiology

## 2020-09-04 NOTE — Telephone Encounter (Signed)
Called patient and confirmed her appointment. Patient understands his AHI showed normal at ... Pt is agreeable to treatment.

## 2020-09-04 NOTE — Telephone Encounter (Signed)
New message:     Patient calling to speak with some one concering CAP machine patient states she need to have appt for medicare, however I told the patient that she has a coming appt. But she is insisting on speaking with some one.

## 2020-09-08 ENCOUNTER — Ambulatory Visit (HOSPITAL_COMMUNITY)
Admission: RE | Admit: 2020-09-08 | Discharge: 2020-09-08 | Disposition: A | Discharge status: Home / Self Care | Payer: Medicare Other | Source: Ambulatory Visit | Attending: Physician Assistant | Admitting: Physician Assistant

## 2020-09-08 ENCOUNTER — Encounter (HOSPITAL_COMMUNITY): Payer: Medicare Other | Admitting: Physician Assistant

## 2020-09-08 ENCOUNTER — Encounter (HOSPITAL_COMMUNITY): Payer: Self-pay | Admitting: Physician Assistant

## 2020-09-08 ENCOUNTER — Other Ambulatory Visit: Payer: Self-pay

## 2020-09-08 VITALS — BP 134/82 | HR 69 | Ht 62.0 in | Wt 272.6 lb

## 2020-09-08 DIAGNOSIS — D6869 Other thrombophilia: Secondary | ICD-10-CM | POA: Diagnosis not present

## 2020-09-08 DIAGNOSIS — I4819 Other persistent atrial fibrillation: Secondary | ICD-10-CM | POA: Insufficient documentation

## 2020-09-08 DIAGNOSIS — E119 Type 2 diabetes mellitus without complications: Secondary | ICD-10-CM | POA: Diagnosis not present

## 2020-09-08 DIAGNOSIS — Z833 Family history of diabetes mellitus: Secondary | ICD-10-CM | POA: Insufficient documentation

## 2020-09-08 DIAGNOSIS — Z79899 Other long term (current) drug therapy: Secondary | ICD-10-CM | POA: Insufficient documentation

## 2020-09-08 DIAGNOSIS — E669 Obesity, unspecified: Secondary | ICD-10-CM | POA: Insufficient documentation

## 2020-09-08 DIAGNOSIS — Z6841 Body Mass Index (BMI) 40.0 and over, adult: Secondary | ICD-10-CM | POA: Insufficient documentation

## 2020-09-08 DIAGNOSIS — I110 Hypertensive heart disease with heart failure: Secondary | ICD-10-CM | POA: Insufficient documentation

## 2020-09-08 DIAGNOSIS — I5032 Chronic diastolic (congestive) heart failure: Secondary | ICD-10-CM | POA: Diagnosis not present

## 2020-09-08 DIAGNOSIS — Z794 Long term (current) use of insulin: Secondary | ICD-10-CM | POA: Diagnosis not present

## 2020-09-08 DIAGNOSIS — Z888 Allergy status to other drugs, medicaments and biological substances status: Secondary | ICD-10-CM | POA: Diagnosis not present

## 2020-09-08 DIAGNOSIS — Z7901 Long term (current) use of anticoagulants: Secondary | ICD-10-CM | POA: Diagnosis not present

## 2020-09-08 DIAGNOSIS — Z8249 Family history of ischemic heart disease and other diseases of the circulatory system: Secondary | ICD-10-CM | POA: Insufficient documentation

## 2020-09-08 DIAGNOSIS — G4733 Obstructive sleep apnea (adult) (pediatric): Secondary | ICD-10-CM | POA: Diagnosis not present

## 2020-09-08 LAB — BASIC METABOLIC PANEL
Anion gap: 9 (ref 5–15)
BUN: 14 mg/dL (ref 8–23)
CO2: 26 mmol/L (ref 22–32)
Calcium: 9.7 mg/dL (ref 8.9–10.3)
Chloride: 102 mmol/L (ref 98–111)
Creatinine, Ser: 1.04 mg/dL — ABNORMAL HIGH (ref 0.44–1.00)
GFR, Estimated: 57 mL/min — ABNORMAL LOW (ref >60)
Glucose, Bld: 146 mg/dL — ABNORMAL HIGH (ref 70–99)
Potassium: 3.9 mmol/L (ref 3.5–5.1)
Sodium: 137 mmol/L (ref 135–145)

## 2020-09-08 LAB — MAGNESIUM: Magnesium: 2 mg/dL (ref 1.7–2.4)

## 2020-09-08 MED ORDER — DOFETILIDE 500 MCG PO CAPS: 500.0000 ug | ORAL_CAPSULE | Freq: Two times a day (BID) | ORAL | 4 refills | Status: DC

## 2020-09-08 NOTE — Progress Notes (Signed)
Primary Care Physician: Kathyrn Lass, MD Primary Cardiologist: Dr Radford Pax Primary Electrophysiologist: Dr Caryl Comes  Referring Physician: Dr Genevie Ann is a 75 y.o. female with a history of HTN, DM, chronic diastolic CHF, atrial fibrillation who presents for follow up in the [facility]. Seen previously by [name] in 2018. She has been maintained on sotalol since 2018. Patient is on Xarelto for a CHADS2VASC score of 5. She was seen by Dr Radford Pax on 05/13/20 and was found to be back in afib. She admits she had stayed up late for a few nights and had not used her CPAP. She has noticed increased fatigue since then. Patient is s/p DCCV 06/03/20. She had an echocardiogram on 07/18/20 and was noted to be back in afib.   On follow up today, patient is s/p dofetilide admission 7/19-7/22/22. She converted with the medication and did not require DCCV. Patient reports she has been congested over the last few days but otherwise feels well. She appears to be maintaining SR. She denies any bleeding issues on anticoagulation.    Today, she denies symptoms of palpitations, chest pain, shortness of breath, orthopnea, PND, lower extremity edema, dizziness, presyncope, syncope, bleeding, or neurologic sequela. The patient is tolerating medications without difficulties and is otherwise without complaint today.    Atrial Fibrillation Risk Factors:  she does have symptoms or diagnosis of sleep apnea. she is compliant with CPAP therapy. she does not have a history of rheumatic fever.   she has a BMI of Body mass index is 49.86 kg/m.Marland Kitchen Filed Weights   09/08/20 1404  Weight: 123.7 kg      Family History  Problem Relation Age of Onset   Cancer Father    Heart failure Father    Diabetes Father    CAD Mother    Diabetes Brother    Hypertension Brother    Breast cancer Neg Hx      Atrial Fibrillation Management history:  Previous antiarrhythmic drugs: sotalol,  dofetilide  Previous cardioversions: 2018 x 3, 06/03/20 Previous ablations: none CHADS2VASC score: 5 Anticoagulation history: Xarelto    Past Medical History:  Diagnosis Date   Abdominal pain, left lower quadrant    Asthmatic bronchitis    Benign essential HTN 04/26/2016   Bursitis of hip    Chronic diastolic CHF (congestive heart failure) (HCC)    DCM (dilated cardiomyopathy) (HCC)    EF 45-50%   GERD (gastroesophageal reflux disease)    High cholesterol    History of kidney stones    Hypersomnia    Morbid obesity with BMI of 50.0-59.9, adult ([place])    OSA on CPAP    Osteoarthritis    "right hip; both knees" (09/21/2016)   Persistent atrial fibrillation ([place]) 04/26/2016   Type II diabetes mellitus ([place])    Vitamin D deficiency disease    Past Surgical History:  Procedure Laterality Date   ABDOMINAL HYSTERECTOMY     APPENDECTOMY     BALLOON DILATION N/A 11/17/2017   Procedure: BALLOON DILATION;  Surgeon: Ronnette Juniper, MD;  Location: [facility]OSCOPY;  Service: Gastroenterology;  Laterality: N/A;   BIOPSY  11/17/2017   Procedure: BIOPSY;  Surgeon: Ronnette Juniper, MD;  Location: [facility];  Service: Gastroenterology;;   CARDIAC CATHETERIZATION     CARDIOVERSION N/A 06/11/2016   Procedure: CARDIOVERSION;  Surgeon: Dorothy Spark, MD;  Location: [facility]COPY;  Service: Cardiovascular;  Laterality: N/A;   CARDIOVERSION N/A 07/16/2016   Procedure: CARDIOVERSION;  Surgeon: Acie Fredrickson Wonda Cheng, MD;  Location: [facility]OSCOPY;  Service: Cardiovascular;  Laterality: N/A;   CARDIOVERSION N/A 09/23/2016   Procedure: CARDIOVERSION;  Surgeon: Sanda Klein, MD;  Location: [facility] ENDOSCOPY;  Service: Cardiovascular;  Laterality: N/A;   CARDIOVERSION N/A 06/03/2020   Procedure: CARDIOVERSION;  Surgeon: Donato Heinz, MD;  Location: [facility] ENDOSCOPY;  Service: Cardiovascular;  Laterality: N/A;   CHOLECYSTECTOMY OPEN     COLONOSCOPY N/A 11/17/2017   Procedure: COLONOSCOPY;  Surgeon: Ronnette Juniper, MD;   Location: [facility];  Service: Gastroenterology;  Laterality: N/A;   cortisone injection to right knee     costisone injection to left knee  11/03/2017   CYSTOSCOPY WITH RETROGRADE PYELOGRAM, URETEROSCOPY AND STENT PLACEMENT Left 01/14/2019   Procedure: CYSTOSCOPy  STENT PLACEMENT;  Surgeon: Robley Fries, MD;  Location: [facility];  Service: Urology;  Laterality: Left;   CYSTOSCOPY WITH RETROGRADE PYELOGRAM, URETEROSCOPY AND STENT PLACEMENT Left 03/06/2019   Procedure: CYSTOSCOPY WITH RETROGRADE PYELOGRAM, URETEROSCOPY AND STENT PLACEMENT;  Surgeon: Robley Fries, MD;  Location: [facility];  Service: Urology;  Laterality: Left;  90 MINS   CYSTOSCOPY WITH STENT PLACEMENT Left 03/07/2019   Procedure: CYSTOSCOPY WITH , URETEROSCOPY/ STENT PLACEMENT;  Surgeon: Ardis Hughs, MD;  Location: [facility];  Service: Urology;  Laterality: Left;   DILATION AND CURETTAGE OF UTERUS     S/P miscarriage   ESOPHAGOGASTRODUODENOSCOPY N/A 11/17/2017   Procedure: ESOPHAGOGASTRODUODENOSCOPY (EGD);  Surgeon: Ronnette Juniper, MD;  Location: [facility];  Service: Gastroenterology;  Laterality: N/A;   HOLMIUM LASER APPLICATION Left 6/38/7564   Procedure: HOLMIUM LASER APPLICATION;  Surgeon: Robley Fries, MD;  Location: [facility];  Service: Urology;  Laterality: Left;   NASAL SEPTUM SURGERY     POLYPECTOMY  11/17/2017   Procedure: POLYPECTOMY;  Surgeon: Ronnette Juniper, MD;  Location: [facility]OS[facility];  Service: Gastroenterology;;   TONSILLECTOMY      Current Outpatient Medications  Medication Sig Dispense Refill   acetaminophen (TYLENOL) 650 MG CR tablet Take 650 mg by mouth 2 (two) times daily as needed for pain.     alendronate (FOSAMAX) 70 MG tablet Take 70 mg by mouth every Sunday. Take with a full glass of water on an empty stomach.     BESIVANCE 0.6 % SUSP Place 1 drop into the right eye 3 (three) times daily.     betamethasone dipropionate 0.05 % cream Apply 1 application topically 2 (two) times daily.      Biotin w/ Vitamins C & E (HAIR SKIN & NAILS GUMMIES PO) Take 2 tablets by mouth daily.      blood glucose meter kit and supplies Dispense based on patient and insurance preference. Use up to four times daily as directed. (FOR ICD-10 E10.9, E11.9). 1 each 0   Calcium-Vitamin D-Vitamin K (CALCIUM + D + K PO) Take 1 tablet by mouth in the morning.     carvedilol (COREG) 3.125 MG tablet TAKE 1 TABLET TWICE DAILY  WITH MEALS 180 tablet 3   Difluprednate 0.05 % EMUL Place 1 drop into the right eye 4 (four) times daily.     diphenhydrAMINE-zinc acetate (BENADRYL) cream Apply 1 application topically 3 (three) times daily as needed for itching.     dofetilide (TIKOSYN) 500 MCG capsule Take 1 capsule (500 mcg total) by mouth 2 (two) times daily. 60 capsule 6   fluocinolone (SYNALAR) 0.01 % external solution Apply 1 application topically 2 (two) times daily as needed (skin irritation).     folic acid (  FOLVITE) 800 MCG tablet Take 800 mcg by mouth daily.     furosemide (LASIX) 20 MG tablet TAKE 2 TABLET (40 MG) EVERY OTHER DAY 180 tablet 3   gabapentin (NEURONTIN) 300 MG capsule Take 300 mg by mouth at bedtime.     HYDROcodone bit-homatropine (HYCODAN) 5-1.5 MG/5ML syrup Take 5 mLs by mouth every 6 (six) hours as needed.     HYDROcodone-acetaminophen (NORCO) 5-325 MG tablet Take 1 tablet by mouth every 4 (four) hours as needed for moderate pain (kidney stone pain). 20 tablet 0   Insulin Glargine (BASAGLAR KWIKPEN [place]) Inject 34 Units into the skin in the morning.     Insulin Pen Needle 31G X 5 MM MISC 12 Units by Does not apply route 3 (three) times daily. 100 each 0   Ipratropium-Albuterol (COMBIVENT RESPIMAT) 20-100 MCG/ACT AERS respimat Inhale 1 puff into the lungs every 6 (six) hours as needed for wheezing or shortness of breath. Use 3 times daily x 5 days, then every 6 hours as needed. 4 g 1   metFORMIN (GLUCOPHAGE) 500 MG tablet Take 500 mg by mouth 2 (two) times daily with a meal.     Misc Natural  Products (GLUCOSAMINE CHONDROITIN TRIPLE) TABS Take 2 tablets by mouth daily.      Multiple Vitamins-Minerals (CENTRUM SILVER) CHEW Chew 2 tablets by mouth daily. soft chews     pantoprazole (PROTONIX) 40 MG tablet Take 1 tablet (40 mg total) by mouth daily. 90 tablet 1   Polyethyl Glycol-Propyl Glycol (SYSTANE) 0.4-0.3 % SOLN Place 1-2 drops into both eyes 3 (three) times daily as needed (dry/irritated eyes).     potassium chloride (KLOR-CON) 10 MEQ tablet TAKE 2 TABLETS BY MOUTH TWICE DAILY 360 tablet 1   PROLENSA 0.07 % SOLN Place 1 drop into the right eye every evening.     psyllium (METAMUCIL SMOOTH TEXTURE) 28 % packet Take 1 packet by mouth in the morning.     rivaroxaban (XARELTO) 20 MG TABS tablet Take 1 tablet (20 mg total) by mouth every evening. 90 tablet 3   simvastatin (ZOCOR) 10 MG tablet Take 1 tablet (10 mg total) by mouth at bedtime. 90 tablet 1   spironolactone (ALDACTONE) 25 MG tablet TAKE 1/2 TABLET (12.5MG    TOTAL) DAILY     triamcinolone cream (KENALOG) 0.1 % Apply 1 application topically 2 (two) times daily as needed (itching legs).      No current facility-administered medications for this encounter.    Allergies  Allergen Reactions   Diphenhydramine Other (See Comments)    heart issues   No [employer] Allergies     Defibrillator pads cause rash.    Social History   Socioeconomic History   Marital status: Unknown    Spouse name: Not on file   Number of children: Not on file   Years of education: Not on file   Highest education level: Not on file  Occupational History   Not on file  Tobacco Use   Smoking status: Never   Smokeless tobacco: Never  Vaping Use   Vaping Use: Never used  Substance and Sexual Activity   Alcohol use: Not Currently   Drug use: No   Sexual activity: Never  Other Topics Concern   Not on file  Social History Narrative   ** Merged History Encounter **       Social Determinants of Health   Financial Resource Strain:  Not on file  Food Insecurity: Not on file  Transportation  Needs: Not on file  Physical Activity: Not on file  Stress: Not on file  Social Connections: Not on file  Intimate Partner Violence: Not on file     ROS- All systems are reviewed and negative except as per the HPI above.  Physical Exam: Vitals:   09/08/20 1404  BP: 134/82  Pulse: 69  Weight: 123.7 kg  Height: 5' 2" (1.575 m)    GEN- The patient is a well appearing obese female, alert and oriented x 3 today.   HEENT-head normocephalic, atraumatic, sclera clear, conjunctiva pink, hearing intact, trachea midline. Lungs- Clear to ausculation bilaterally, normal work of breathing Heart- Regular rate and rhythm, no murmurs, rubs or gallops  GI- soft, NT, ND, + BS Extremities- no clubbing, cyanosis, or edema MS- no significant deformity or atrophy Skin- no rash or lesion Psych- euthymic mood, full affect Neuro- strength and sensation are intact   Wt Readings from Last 3 Encounters:  09/08/20 123.7 kg  08/26/20 129.6 kg  08/29/20 127.9 kg    EKG today demonstrates  SR, LAFB Vent. rate 69 BPM PR interval 148 ms QRS duration 108 ms QT/QTcB 474/507 ms (470s manually calculated)  Echo 07/18/20 demonstrated  1. Left ventricular ejection fraction, by estimation, is 50 to 55%. The  left ventricle has low normal function. The left ventricle has no regional wall motion abnormalities. Diastolic function indeterminant due to Afib.   2. Right ventricular systolic function is mildly reduced. The right  ventricular size is normal. Tricuspid regurgitation signal is inadequate for assessing PA pressure.   3. A small pericardial effusion is present. The pericardial effusion is circumferential and measures 0.85cm at end-diastole.   4. The mitral valve is normal in structure. Trivial mitral valve  regurgitation.   5. The aortic valve is tricuspid. There is mild calcification of the  aortic valve. There is mild thickening of the  aortic valve. Aortic valve  regurgitation is trivial. Mild aortic valve sclerosis is present, with no  evidence of aortic valve stenosis.   6. The inferior vena cava is normal in size with greater than 50%  respiratory variability, suggesting right atrial pressure of 3 mmHg.   Comparison(s): Compared to prior TTE in 02/2019, the patient is now in Afib with LVEF 50-55% (previously 55-60%). There continues to be a small, circumferential pericardial effusion that is grossly unchanged in size.   Epic records are reviewed at length today  CHA2DS2-VASc Score = 5  The patient's score is based upon: CHF History: Yes HTN History: Yes Diabetes History: Yes Stroke History: No Vascular Disease History: No Age Score: 1 Gender Score: 1      ASSESSMENT AND PLAN: 1. Persistent Atrial Fibrillation (ICD10:  I48.19) The patient's CHA2DS2-VASc score is 5, indicating a 7.2% annual risk of stroke.  S/p dofetilide admission 7/19-7/22/22 Patient appears to be maintaining SR. Continue dofetilide 500 mcg BID. QT stable. (470s ms on manual calc) Check bmet/mag today Continue Continue Xarelto 20 mg daily Continue Coreg 3.125 mg BID   2. Secondary Hypercoagulable State (ICD10:  D68.69) The patient is at significant risk for stroke/thromboembolism based upon her CHA2DS2-VASc Score of 5.  Continue Rivaroxaban (Xarelto).   3. Obesity Body mass index is 49.86 kg/m. Lifestyle modification was discussed and encouraged including regular physical activity and weight reduction.  4. Obstructive sleep apnea Patient reports compliance with CPAP therapy.  Followed by Dr Radford Pax.  5. HTN Stable, no changes today.  6. Chronic diastolic CHF No signs or symptoms of fluid overload  today. She did receive IV Lasix during her dofetilide admission. She is interested in Alleviate-HF trial.   Follow up with Oda Kilts as scheduled.    [facility] [address] [place], [place] [address] [phone] 09/08/2020 2:31 PM

## 2020-09-09 DIAGNOSIS — G4733 Obstructive sleep apnea (adult) (pediatric): Secondary | ICD-10-CM | POA: Diagnosis not present

## 2020-09-12 NOTE — Addendum Note (Signed)
Addended by: Carylon Perches on: 09/12/2020 01:13 PM   Modules accepted: Orders

## 2020-09-22 ENCOUNTER — Telehealth: Payer: Self-pay

## 2020-09-22 DIAGNOSIS — H2511 Age-related nuclear cataract, right eye: Secondary | ICD-10-CM | POA: Diagnosis not present

## 2020-09-22 NOTE — Telephone Encounter (Signed)
  Patient Consent for Virtual Visit        Karen Rasmussen has provided verbal consent on 09/22/2020 for a virtual visit (video or telephone).   CONSENT FOR VIRTUAL VISIT FOR:  Karen Rasmussen  By participating in this virtual visit I agree to the following:  I hereby voluntarily request, consent and authorize [facility] and its employed or contracted physicians, physician assistants, nurse practitioners or other licensed health care professionals (the Practitioner), to provide me with telemedicine health care services (the "Services") as deemed necessary by the treating Practitioner. I acknowledge and consent to receive the Services by the Practitioner via telemedicine. I understand that the telemedicine visit will involve communicating with the Practitioner through live audiovisual communication technology and the disclosure of certain medical information by electronic transmission. I acknowledge that I have been given the opportunity to request an in-person assessment or other available alternative prior to the telemedicine visit and am voluntarily participating in the telemedicine visit.  I understand that I have the right to withhold or withdraw my consent to the use of telemedicine in the course of my care at any time, without affecting my right to future care or treatment, and that the Practitioner or I may terminate the telemedicine visit at any time. I understand that I have the right to inspect all information obtained and/or recorded in the course of the telemedicine visit and may receive copies of available information for a reasonable fee.  I understand that some of the potential risks of receiving the Services via telemedicine include:  Delay or interruption in medical evaluation due to technological equipment failure or disruption; Information transmitted may not be sufficient (e.g. poor resolution of images) to allow for appropriate medical decision making by the Practitioner; and/or   In rare instances, security protocols could fail, causing a breach of personal health information.  Furthermore, I acknowledge that it is my responsibility to provide information about my medical history, conditions and care that is complete and accurate to the best of my ability. I acknowledge that Practitioner's advice, recommendations, and/or decision may be based on factors not within their control, such as incomplete or inaccurate data provided by me or distortions of diagnostic images or specimens that may result from electronic transmissions. I understand that the practice of medicine is not an exact science and that Practitioner makes no warranties or guarantees regarding treatment outcomes. I acknowledge that a copy of this consent can be made available to me via my patient portal ([facility]), or I can request a printed copy by calling the office of [facility].    I understand that my insurance will be billed for this visit.   I have read or had this consent read to me. I understand the contents of this consent, which adequately explains the benefits and risks of the Services being provided via telemedicine.  I have been provided ample opportunity to ask questions regarding this consent and the Services and have had my questions answered to my satisfaction. I give my informed consent for the services to be provided through the use of telemedicine in my medical care

## 2020-09-23 ENCOUNTER — Other Ambulatory Visit: Payer: Self-pay

## 2020-09-23 ENCOUNTER — Encounter: Payer: Self-pay | Admitting: Cardiology

## 2020-09-23 ENCOUNTER — Telehealth: Payer: Medicare Other | Admitting: Cardiology

## 2020-09-23 ENCOUNTER — Telehealth (INDEPENDENT_AMBULATORY_CARE_PROVIDER_SITE_OTHER): Payer: Medicare Other | Admitting: Cardiology

## 2020-09-23 VITALS — BP 159/80 | Ht 62.0 in | Wt 272.0 lb

## 2020-09-23 DIAGNOSIS — I4819 Other persistent atrial fibrillation: Secondary | ICD-10-CM | POA: Diagnosis not present

## 2020-09-23 DIAGNOSIS — I10 Essential (primary) hypertension: Secondary | ICD-10-CM | POA: Diagnosis not present

## 2020-09-23 DIAGNOSIS — I5032 Chronic diastolic (congestive) heart failure: Secondary | ICD-10-CM

## 2020-09-23 DIAGNOSIS — I313 Pericardial effusion (noninflammatory): Secondary | ICD-10-CM

## 2020-09-23 DIAGNOSIS — H2512 Age-related nuclear cataract, left eye: Secondary | ICD-10-CM | POA: Diagnosis not present

## 2020-09-23 DIAGNOSIS — I350 Nonrheumatic aortic (valve) stenosis: Secondary | ICD-10-CM | POA: Diagnosis not present

## 2020-09-23 DIAGNOSIS — I420 Dilated cardiomyopathy: Secondary | ICD-10-CM

## 2020-09-23 DIAGNOSIS — I3139 Other pericardial effusion (noninflammatory): Secondary | ICD-10-CM

## 2020-09-23 NOTE — Progress Notes (Signed)
Virtual Visit via Video Note   This visit type was conducted due to national recommendations for restrictions regarding the COVID-19 Pandemic (e.g. social distancing) in an effort to limit this patient's exposure and mitigate transmission in our community.  Due to her co-morbid illnesses, this patient is at least at moderate risk for complications without adequate follow up.  This format is felt to be most appropriate for this patient at this time.  All issues noted in this document were discussed and addressed.  A limited physical exam was performed with this format.  Please refer to the patient's chart for her consent to telehealth for [facility].  Date:  09/23/2020   ID:  Karen Rasmussen, DOB 06-03-1945, MRN [id number] The patient was identified using 2 identifiers. Patient Location: Home Provider Location: Office/Clinic   PCP:  Kathyrn Lass, MD   [facility] Providers Cardiologist:  Fransico Him, MD   Evaluation Performed:  Follow-Up Visit  Chief Complaint:  CHF, PAF, DCM, OSA  History of Present Illness:    Karen Rasmussen is a 75 y.o. female with history of normal coronary arteries with calcium score of 0 on coronary CTA in 08/5641, chronic diastolic CHF/non-ischemic cardiomyopathy with EF of 45-50%, paroxysmal atrial fibrillation s/p DCCV x2 on Xarelto, obstructive sleep apnea on CPAP, hypertension, diabetes mellitus, and morbid obesity.  She had a reoccurrence of her afib and was seen in afib clinic and her Betapace was switched to Tikosyn. 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   She is here today for followup and is doing well.  She denies any chest pain or pressure, SOB, DOE, PND, orthopnea,  dizziness, palpitations or syncope. She has chronic LE edema which is stable on diuretics.  She is compliant with her meds and is tolerating meds with no SE.     She is doing well with her CPAP device and thinks that she has gotten used to it.  She tolerates the  mask and feels the pressure is adequate.  Since going on CPAP she feels rested in the am and has no significant daytime sleepiness.  She denies any significant mouth or nasal dryness or nasal congestion.  She does not think that he snores.       The patient does not have symptoms concerning for COVID-19 infection (fever, chills, cough, or new shortness of breath).    Past Medical History:  Diagnosis Date   Abdominal pain, left lower quadrant    Asthmatic bronchitis    Benign essential HTN 04/26/2016   Bursitis of hip    Chronic diastolic CHF (congestive heart failure) (HCC)    DCM (dilated cardiomyopathy) (HCC)    EF 45-50%   GERD (gastroesophageal reflux disease)    High cholesterol    History of kidney stones    Hypersomnia    Morbid obesity with BMI of 50.0-59.9, adult ([place])    OSA on CPAP    Osteoarthritis    "right hip; both knees" (09/21/2016)   Persistent atrial fibrillation ([place]) 04/26/2016   Type II diabetes mellitus ([place])    Vitamin D deficiency disease    Past Surgical History:  Procedure Laterality Date   ABDOMINAL HYSTERECTOMY     APPENDECTOMY     BALLOON DILATION N/A 11/17/2017   Procedure: BALLOON DILATION;  Surgeon: Ronnette Juniper, MD;  Location: [facility] ENDOSCOPY;  Service: Gastroenterology;  Laterality: N/A;   BIOPSY  11/17/2017   Procedure: BIOPSY;  Surgeon: Ronnette Juniper, MD;  Location: [facility]OS[facility];  Service: Gastroenterology;;   CARDIAC CATHETERIZATION     CARDIOVERSION N/A 06/11/2016   Procedure: CARDIOVERSION;  Surgeon: Dorothy Spark, MD;  Location: [facility] ENDOSCOPY;  Service: Cardiovascular;  Laterality: N/A;   CARDIOVERSION N/A 07/16/2016   Procedure: CARDIOVERSION;  Surgeon: Thayer Headings, MD;  Location: [facility] ENDOSCOPY;  Service: Cardiovascular;  Laterality: N/A;   CARDIOVERSION N/A 09/23/2016   Procedure: CARDIOVERSION;  Surgeon: Sanda Klein, MD;  Location: [facility] ENDOSCOPY;  Service: Cardiovascular;  Laterality: N/A;   CARDIOVERSION N/A 06/03/2020   Procedure:  CARDIOVERSION;  Surgeon: Donato Heinz, MD;  Location: [facility] ENDOSCOPY;  Service: Cardiovascular;  Laterality: N/A;   CHOLECYSTECTOMY OPEN     COLONOSCOPY N/A 11/17/2017   Procedure: COLONOSCOPY;  Surgeon: Ronnette Juniper, MD;  Location: [facility]OSCOPY;  Service: Gastroenterology;  Laterality: N/A;   cortisone injection to right knee     costisone injection to left knee  11/03/2017   CYSTOSCOPY WITH RETROGRADE PYELOGRAM, URETEROSCOPY AND STENT PLACEMENT Left 01/14/2019   Procedure: CYSTOSCOPy  STENT PLACEMENT;  Surgeon: Robley Fries, MD;  Location: [facility] ORS;  Service: Urology;  Laterality: Left;   CYSTOSCOPY WITH RETROGRADE PYELOGRAM, URETEROSCOPY AND STENT PLACEMENT Left 03/06/2019   Procedure: CYSTOSCOPY WITH RETROGRADE PYELOGRAM, URETEROSCOPY AND STENT PLACEMENT;  Surgeon: Robley Fries, MD;  Location: [facility];  Service: Urology;  Laterality: Left;  90 MINS   CYSTOSCOPY WITH STENT PLACEMENT Left 03/07/2019   Procedure: CYSTOSCOPY WITH , URETEROSCOPY/ STENT PLACEMENT;  Surgeon: Ardis Hughs, MD;  Location: [facility];  Service: Urology;  Laterality: Left;   DILATION AND CURETTAGE OF UTERUS     S/P miscarriage   ESOPHAGOGASTRODUODENOSCOPY N/A 11/17/2017   Procedure: ESOPHAGOGASTRODUODENOSCOPY (EGD);  Surgeon: Ronnette Juniper, MD;  Location: [facility];  Service: Gastroenterology;  Laterality: N/A;   HOLMIUM LASER APPLICATION Left 1/61/0960   Procedure: HOLMIUM LASER APPLICATION;  Surgeon: Robley Fries, MD;  Location: [facility];  Service: Urology;  Laterality: Left;   NASAL SEPTUM SURGERY     POLYPECTOMY  11/17/2017   Procedure: POLYPECTOMY;  Surgeon: Ronnette Juniper, MD;  Location: [facility];  Service: Gastroenterology;;   TONSILLECTOMY       Current Meds  Medication Sig   acetaminophen (TYLENOL) 650 MG CR tablet Take 650 mg by mouth 2 (two) times daily as needed for pain.   alendronate (FOSAMAX) 70 MG tablet Take 70 mg by mouth every Sunday. Take with a full glass of water on an  empty stomach.   BESIVANCE 0.6 % SUSP Place 1 drop into the right eye 3 (three) times daily.   betamethasone dipropionate 0.05 % cream Apply 1 application topically 2 (two) times daily.   Biotin w/ Vitamins C & E (HAIR SKIN & NAILS GUMMIES PO) Take 2 tablets by mouth daily.    blood glucose meter kit and supplies Dispense based on patient and insurance preference. Use up to four times daily as directed. (FOR ICD-10 E10.9, E11.9).   Calcium-Vitamin D-Vitamin K (CALCIUM + D + K PO) Take 1 tablet by mouth in the morning.   carvedilol (COREG) 3.125 MG tablet TAKE 1 TABLET TWICE DAILY  WITH MEALS   Difluprednate 0.05 % EMUL Place 1 drop into the right eye 4 (four) times daily.   diphenhydrAMINE-zinc acetate (BENADRYL) cream Apply 1 application topically 3 (three) times daily as needed for itching.   dofetilide (TIKOSYN) 500 MCG capsule Take 1 capsule (500 mcg total) by mouth 2 (two) times daily.   fluocinolone (SYNALAR) 0.01 %  external solution Apply 1 application topically 2 (two) times daily as needed (skin irritation).   folic acid (FOLVITE) 185 MCG tablet Take 800 mcg by mouth daily.   furosemide (LASIX) 20 MG tablet TAKE 2 TABLET (40 MG) EVERY OTHER DAY   gabapentin (NEURONTIN) 300 MG capsule Take 300 mg by mouth at bedtime.   HYDROcodone bit-homatropine (HYCODAN) 5-1.5 MG/5ML syrup Take 5 mLs by mouth every 6 (six) hours as needed.   HYDROcodone-acetaminophen (NORCO) 5-325 MG tablet Take 1 tablet by mouth every 4 (four) hours as needed for moderate pain (kidney stone pain).   Insulin Glargine (BASAGLAR KWIKPEN [place]) Inject 34 Units into the skin in the morning.   Insulin Pen Needle 31G X 5 MM MISC 12 Units by Does not apply route 3 (three) times daily.   Ipratropium-Albuterol (COMBIVENT RESPIMAT) 20-100 MCG/ACT AERS respimat Inhale 1 puff into the lungs every 6 (six) hours as needed for wheezing or shortness of breath. Use 3 times daily x 5 days, then every 6 hours as needed.   metFORMIN  (GLUCOPHAGE) 500 MG tablet Take 500 mg by mouth 2 (two) times daily with a meal.   Misc Natural Products (GLUCOSAMINE CHONDROITIN TRIPLE) TABS Take 2 tablets by mouth daily.    Multiple Vitamins-Minerals (CENTRUM SILVER) CHEW Chew 2 tablets by mouth daily. soft chews   pantoprazole (PROTONIX) 40 MG tablet Take 40 mg by mouth 2 (two) times daily.   Polyethyl Glycol-Propyl Glycol (SYSTANE) 0.4-0.3 % SOLN Place 1-2 drops into both eyes 3 (three) times daily as needed (dry/irritated eyes).   potassium chloride (KLOR-CON) 10 MEQ tablet TAKE 2 TABLETS BY MOUTH TWICE DAILY   PROLENSA 0.07 % SOLN Place 1 drop into the right eye every evening.   psyllium (METAMUCIL SMOOTH TEXTURE) 28 % packet Take 1 packet by mouth in the morning.   rivaroxaban (XARELTO) 20 MG TABS tablet Take 1 tablet (20 mg total) by mouth every evening.   simvastatin (ZOCOR) 10 MG tablet Take 1 tablet (10 mg total) by mouth at bedtime.   spironolactone (ALDACTONE) 25 MG tablet TAKE 1/2 TABLET (12.5MG    TOTAL) DAILY   triamcinolone cream (KENALOG) 0.1 % Apply 1 application topically 2 (two) times daily as needed (itching legs).      Allergies:   Diphenhydramine and No healthtouch food allergies   Social History   Tobacco Use   Smoking status: Never   Smokeless tobacco: Never  Vaping Use   Vaping Use: Never used  Substance Use Topics   Alcohol use: Not Currently   Drug use: No     Family Hx: The patient's family history includes CAD in her mother; Cancer in her father; Diabetes in her brother and father; Heart failure in her father; Hypertension in her brother. There is no history of Breast cancer.  ROS:   Please see the history of present illness.     All other systems reviewed and are negative.   Prior CV studies:   The following studies were reviewed today:  PAP download  Labs/Other Tests and Data Reviewed:    EKG:  No ECG reviewed.  Recent Labs: 05/21/2020: Platelets 265 06/03/2020: Hemoglobin  14.6 08/27/2020: B Natriuretic Peptide 97.1 09/08/2020: BUN 14; Creatinine, Ser 1.04; Magnesium 2.0; Potassium 3.9; Sodium 137   Recent Lipid Panel No results found for: CHOL, TRIG, HDL, CHOLHDL, LDLCALC, LDLDIRECT  Wt Readings from Last 3 Encounters:  09/23/20 272 lb (123.4 kg)  09/08/20 272 lb 9.6 oz (123.7 kg)  08/26/20 285 lb  12.8 oz (129.6 kg)     Risk Assessment/Calculations:    CHA2DS2-VASc Score = 5  This indicates a 7.2% annual risk of stroke. The patient's score is based upon: CHF History: Yes HTN History: Yes Diabetes History: Yes Stroke History: No Vascular Disease History: No Age Score: 1 Gender Score: 1   Objective:    Vital Signs:  BP (!) 159/80   Ht 5' 2"  (1.575 m)   Wt 272 lb (123.4 kg)   BMI 49.75 kg/m    VITAL SIGNS:  reviewed GEN:  no acute distress EYES:  sclerae anicteric, EOMI - Extraocular Movements Intact RESPIRATORY:  normal respiratory effort, symmetric expansion CARDIOVASCULAR:  no peripheral edema SKIN:  no rash, lesions or ulcers. MUSCULOSKELETAL:  no obvious deformities. NEURO:  alert and oriented x 3, no obvious focal deficit PSYCH:  normal affect  ASSESSMENT & PLAN:    Dilated cardiomyopathy  -EF 45 to 50% on echo 05/2016 and 55% on echo 02/2019.  Repeat echo 07/2020 with EF 50-55% but was in setting of afib -coronary calcium score 0 and normal coronaries on coronary CT 09/2016   Chronic diastolic CHF -echo 05/2874 LVEF 55-60% mod LVH G2DD  -she denies any SOB or LE edema and her weight has been stable -Continue prescription drug management with Lasix 45m QOD, Spiro 12.555mdaily, Carvedilol 3.12528mID>refilled  -I have personally reviewed and interpreted outside labs performed by patient's PCP which showed SCr 1.04, K+ 3.9 in Aug 2022   Essential hypertension  -BP borderline controlled on exam today but has not taken her BP meds today yet but was normal at her cataract surgery appt yesterday -Continue prescription drug management  with Carvedilol 3.125m55mD and spiro 12.5mg 75mly    Persistent atrial fibrillation  -s/p DCCV 09/2016  -she had a reoccurrence of her afib and was seen in afib clinic and her Betapace was stopped and is now on Dofetilide 500mcg17m -she has not had any palpitations -denies any bleeding problems on DOAC -Continue prescription drug management with Xarelto 20mg d82m, Dofetilide 500mcg B44mnd Carvedilol 3.125mg BID58mI have personally reviewed and interpreted outside PCP performed by patient's PCP which showed Hbg 14.6 in April 2022 (CrCL 95.24ml/min)28mg normal at 12.6 in March 2022   Aortic stenosis -mild on echo 02/2019  -repeat echo 02/2021   OSA   - The patient is tolerating PAP therapy well without any problems. The PAP download performed by his DME was personally reviewed and interpreted by me today and showed an AHI of 0.8/hr on aturo PAP cm H2O with 87% compliance in using more than 4 hours nightly.  The patient has been using and benefiting from PAP use and will continue to benefit from therapy.     Pericardial effusion -small on echo a year ago -stable trivial effusion on echo 07/2020    COVID-19 Education: The signs and symptoms of COVID-19 were discussed with the patient and how to seek care for testing (follow up with PCP or arrange E-visit).  The importance of social distancing was discussed today.  Time:   Today, I have spent 20 minutes with the patient with telehealth technology discussing the above problems.     Medication Adjustments/Labs and Tests Ordered: Current medicines are reviewed at length with the patient today.  Concerns regarding medicines are outlined above.   Tests Ordered: No orders of the defined types were placed in this encounter.   Medication Changes: No orders of the defined types were placed in  this encounter.   Follow Up:  In Person in 6 months  Signed, Fransico Him, MD  09/23/2020 11:55 AM    [place]

## 2020-09-23 NOTE — Patient Instructions (Signed)
Medication Instructions:  Your physician recommends that you continue on your current medications as directed. Please refer to the Current Medication list given to you today.  *If you need a refill on your cardiac medications before your next appointment, please call your pharmacy*   Testing/Procedures: Your physician has requested that you have an echocardiogram in January 2023. Echocardiography is a painless test that uses sound waves to create images of your heart. It provides your doctor with information about the size and shape of your heart and how well your heart's chambers and valves are working. This procedure takes approximately one hour. There are no restrictions for this procedure.   Follow-Up: At [facility], you and your health needs are our priority.  As part of our continuing mission to provide you with exceptional heart care, we have created designated Provider Care Teams.  These Care Teams include your primary Cardiologist (physician) and Advanced Practice Providers (APPs -  Physician Assistants and Nurse Practitioners) who all work together to provide you with the care you need, when you need it.  Your next appointment:   6 month(s)  The format for your next appointment:   In Person  Provider:   You may see Fransico Him, MD or one of the following Advanced Practice Providers on your designated Care Team:   Melina Copa, PA-C Ermalinda Barrios, PA-C

## 2020-10-06 DIAGNOSIS — H2512 Age-related nuclear cataract, left eye: Secondary | ICD-10-CM | POA: Diagnosis not present

## 2020-10-08 ENCOUNTER — Ambulatory Visit: Payer: Medicare Other | Admitting: Student

## 2020-10-08 DIAGNOSIS — F329 Major depressive disorder, single episode, unspecified: Secondary | ICD-10-CM | POA: Diagnosis not present

## 2020-10-08 DIAGNOSIS — I119 Hypertensive heart disease without heart failure: Secondary | ICD-10-CM | POA: Diagnosis not present

## 2020-10-08 DIAGNOSIS — I5032 Chronic diastolic (congestive) heart failure: Secondary | ICD-10-CM | POA: Diagnosis not present

## 2020-10-08 DIAGNOSIS — E119 Type 2 diabetes mellitus without complications: Secondary | ICD-10-CM | POA: Diagnosis not present

## 2020-10-08 DIAGNOSIS — K219 Gastro-esophageal reflux disease without esophagitis: Secondary | ICD-10-CM | POA: Diagnosis not present

## 2020-10-08 DIAGNOSIS — I10 Essential (primary) hypertension: Secondary | ICD-10-CM | POA: Diagnosis not present

## 2020-10-08 DIAGNOSIS — E7800 Pure hypercholesterolemia, unspecified: Secondary | ICD-10-CM | POA: Diagnosis not present

## 2020-10-08 DIAGNOSIS — E785 Hyperlipidemia, unspecified: Secondary | ICD-10-CM | POA: Diagnosis not present

## 2020-10-08 DIAGNOSIS — M810 Age-related osteoporosis without current pathological fracture: Secondary | ICD-10-CM | POA: Diagnosis not present

## 2020-10-08 DIAGNOSIS — I4891 Unspecified atrial fibrillation: Secondary | ICD-10-CM | POA: Diagnosis not present

## 2020-10-08 DIAGNOSIS — M8580 Other specified disorders of bone density and structure, unspecified site: Secondary | ICD-10-CM | POA: Diagnosis not present

## 2020-10-08 DIAGNOSIS — E1169 Type 2 diabetes mellitus with other specified complication: Secondary | ICD-10-CM | POA: Diagnosis not present

## 2020-10-10 ENCOUNTER — Ambulatory Visit: Payer: Medicare Other | Admitting: Student

## 2020-10-15 ENCOUNTER — Other Ambulatory Visit: Payer: Self-pay | Admitting: Cardiology

## 2020-10-16 ENCOUNTER — Other Ambulatory Visit: Payer: Self-pay | Admitting: Cardiology

## 2020-10-16 MED ORDER — POTASSIUM CHLORIDE CRYS ER 10 MEQ PO TBCR: EXTENDED_RELEASE_TABLET | ORAL | 3 refills | Status: DC

## 2020-10-17 DIAGNOSIS — I4891 Unspecified atrial fibrillation: Secondary | ICD-10-CM | POA: Diagnosis not present

## 2020-10-17 DIAGNOSIS — E7800 Pure hypercholesterolemia, unspecified: Secondary | ICD-10-CM | POA: Diagnosis not present

## 2020-10-17 DIAGNOSIS — I5032 Chronic diastolic (congestive) heart failure: Secondary | ICD-10-CM | POA: Diagnosis not present

## 2020-10-17 DIAGNOSIS — E1169 Type 2 diabetes mellitus with other specified complication: Secondary | ICD-10-CM | POA: Diagnosis not present

## 2020-10-17 DIAGNOSIS — E119 Type 2 diabetes mellitus without complications: Secondary | ICD-10-CM | POA: Diagnosis not present

## 2020-10-17 DIAGNOSIS — F329 Major depressive disorder, single episode, unspecified: Secondary | ICD-10-CM | POA: Diagnosis not present

## 2020-10-17 DIAGNOSIS — K219 Gastro-esophageal reflux disease without esophagitis: Secondary | ICD-10-CM | POA: Diagnosis not present

## 2020-10-17 DIAGNOSIS — M8580 Other specified disorders of bone density and structure, unspecified site: Secondary | ICD-10-CM | POA: Diagnosis not present

## 2020-10-17 DIAGNOSIS — I10 Essential (primary) hypertension: Secondary | ICD-10-CM | POA: Diagnosis not present

## 2020-10-17 DIAGNOSIS — M810 Age-related osteoporosis without current pathological fracture: Secondary | ICD-10-CM | POA: Diagnosis not present

## 2020-10-17 DIAGNOSIS — I119 Hypertensive heart disease without heart failure: Secondary | ICD-10-CM | POA: Diagnosis not present

## 2020-10-20 ENCOUNTER — Other Ambulatory Visit: Payer: Self-pay

## 2020-10-20 ENCOUNTER — Ambulatory Visit (INDEPENDENT_AMBULATORY_CARE_PROVIDER_SITE_OTHER): Payer: Medicare Other | Admitting: Internal Medicine

## 2020-10-20 ENCOUNTER — Encounter: Payer: Self-pay | Admitting: Internal Medicine

## 2020-10-20 VITALS — BP 150/78 | HR 70 | Resp 14 | Ht 62.0 in | Wt 284.0 lb

## 2020-10-20 VITALS — BP 150/78 | HR 70 | Ht 62.0 in | Wt 284.0 lb

## 2020-10-20 DIAGNOSIS — I5032 Chronic diastolic (congestive) heart failure: Secondary | ICD-10-CM

## 2020-10-20 DIAGNOSIS — Z006 Encounter for examination for normal comparison and control in clinical research program: Secondary | ICD-10-CM

## 2020-10-20 DIAGNOSIS — I4819 Other persistent atrial fibrillation: Secondary | ICD-10-CM

## 2020-10-20 HISTORY — PX: OTHER SURGICAL HISTORY: SHX169

## 2020-10-20 MED ORDER — FUROSEMIDE 20 MG PO TABS: ORAL_TABLET | ORAL | 3 refills | Status: DC

## 2020-10-20 NOTE — Research (Signed)
Alleviate HF Research Study  Alleviate Informed Consent   Subject Name: Karen Rasmussen  Subject met inclusion and exclusion criteria.  The informed consent form, study requirements and expectations were reviewed with the subject and questions and concerns were addressed prior to the signing of the consent form.  The subject verbalized understanding of the trial requirements.  The subject agreed to participate in the Alleviate trial and signed the informed consent at 1000 on 10/20/2020.  The informed consent was obtained prior to performance of any protocol-specific procedures for the subject.  A copy of the signed informed consent was given to the subject and a copy was placed in the subject's medical record.   Wynne Rozak    Reviewed of history and physical by investigator.   Labs collected  6 MHWT not performed due to patient just having cataract surgery and did not want to complete walk at this time.  INCLUSION _0  _1  Patient has NYHA Class II or III heart failure per most recent assessment, irrespective of left ventricular ejection fraction (LVEF)  _2  _3  Patient has documented recent history of symptomatic heart failure, defined as meeting any one of the following three criteria: 1. Hospital admission with primary diagnosis of HF within the last 12 months, OR 2. Intravenous HF therapy (e.g. IV diuretics/vasodilators) or ultrafiltration within the last 6 months, OR 3. Patient had the following BNP/NT-proBNP6 within the last 3 months: - If LVEF ? 50%, then BNP> 150 pg/ml or NT-proBNP > 450 pg/ml OR - If LVEF is <50%, then BNP> 300 pg/ml or NT-proBNP > 900 pg/ml  _4  _5  Patient is willing and able to comply with the protocol, including LINQ ICM insertion, CareLink transmissions (including adequate connectivity), study visits and remote care directions  _6  _7  Patient is 73 years of age or older  _8  _9  Patient has a life expectancy of 12 months or more    EXCLUSION _10  _11  Patient is  currently implanted with a cardiovascular implantable electronic device (CIED)7 (e.g. ICM8, pacemaker, ICD, CRT-D or CRT-P device) or hemodynamic monitor   _12  _13  Patient is receiving temporary or permanent mechanical circulatory support   _14  _15  Patient had MI or PCI/CABG within past 90 days   _16  _17  Patient has had a heart transplant, or is currently on heart transplant list   _18  _19  Patient has severe valve stenosis on echocardiogram   _20  _21  Patient has primary pulmonary hypertension (pre-capillary, WHO group 1,3,4,5)   _22  _23  Patient is on chronic intravenous inotropic drug therapy (e.g. dobutamine, milrinone)   _24  _25  Patient has severe renal impairment (eGFR <30 mL/min)   _26  _27  Patient has systolic blood pressure of < 90 mmHg at the time of enrollment   _28  _29  Patient is on chronic renal dialysis   _30  _31  Patient is unable to undergo one round of PRN medication intervention (i.e. 4 days of increased diuretics dose), based on the judgement of the investigator (e.g. known history of side effects or intolerance to PRN dosing of diuretics)   _32  _33  Patient has liver disease, defined as AST/ALT >5x normal, or bilirubin >2x normal   _34  _35  Patient has serum albumin < 3 g/dL   _36  _37  Patient has hypertrophic obstructive cardiomyopathy, constrictive pericarditis or amyloidosis   _38  _39   Patient has complex adult congenital heart disease   _40  _41  Patient has active cancer involving chemotherapy and/or radiation therapy   _42  _43  Patient weighs more than 500 pounds  _44  _45  Patient is pregnant (all  females of child-bearing potential must have a negative pregnancy test within 1 week of enrollment)

## 2020-10-20 NOTE — Patient Instructions (Addendum)
Medication Instructions:  Your physician recommends that you continue on your current medications as directed. Please refer to the Current Medication list given to you today.  Labwork: None ordered.  Testing/Procedures: None ordered.  Follow-Up:  Your physician wants you to follow-up in: 3 months with the Afib Clinic. They will contact you to schedule.   Implantable Loop Recorder Placement, Care After This sheet gives you information about how to care for yourself after your procedure. Your health care provider may also give you more specific instructions. If you have problems or questions, contact your health care provider. What can I expect after the procedure? After the procedure, it is common to have: Soreness or discomfort near the incision. Some swelling or bruising near the incision.  Follow these instructions at home: Incision care   Leave your outer dressing on for 24 hours.  After 24 hours you can remove your outer dressing and shower. Leave adhesive strips in place. These skin closures may need to stay in place for 1-2 weeks. If adhesive strip edges start to loosen and curl up, you may trim the loose edges.  You may remove the strips if they have not fallen off after 2 weeks. Check your incision area every day for signs of infection. Check for: Redness, swelling, or pain. Fluid or blood. Warmth. Pus or a bad smell. Do not take baths, swim, or use a hot tub until your incision is completely healed. If your wound site starts to bleed apply pressure.      If you have any questions/concerns please call the device clinic at [phone].  Activity  Return to your normal activities.  General instructions Follow instructions from your health care provider about how to manage your implantable loop recorder and transmit the information. Learn how to activate a recording if this is necessary for your type of device. Do not go through a metal detection gate, and do not let  someone hold a metal detector over your chest. Show your ID card. Do not have an MRI unless you check with your health care provider first. Take over-the-counter and prescription medicines only as told by your health care provider. Keep all follow-up visits as told by your health care provider. This is important. Contact a health care provider if: You have redness, swelling, or pain around your incision. You have a fever. You have pain that is not relieved by your pain medicine. You have triggered your device because of fainting (syncope) or because of a heartbeat that feels like it is racing, slow, fluttering, or skipping (palpitations). Get help right away if you have: Chest pain. Difficulty breathing. Summary After the procedure, it is common to have soreness or discomfort near the incision. Change your dressing as told by your health care provider. Follow instructions from your health care provider about how to manage your implantable loop recorder and transmit the information. Keep all follow-up visits as told by your health care provider. This is important. This information is not intended to replace advice given to you by your health care provider. Make sure you discuss any questions you have with your health care provider. Document Released: 01/06/2015 Document Revised: 03/12/2017 Document Reviewed: 03/12/2017 Elsevier Patient Education  2020 [employer].

## 2020-10-20 NOTE — Addendum Note (Signed)
Addended by: Rubie Maid B on: 10/20/2020 12:33 PM   Modules accepted: Orders

## 2020-10-20 NOTE — Progress Notes (Signed)
PCP: Kathyrn Lass, MD Primary Cardiologist: Dr Radford Pax Primary EP: Dr Sherryl Barters is a 75 y.o. female who presents today for electrophysiology followup.  She has chronic diastolic dysfunction as well as a history of atrial fibrillation.  + uses CPAP.  She has been placed on tikosyn for afib management and is doing well.  Today, she denies symptoms of palpitations, chest pain, shortness of breath,  lower extremity edema, dizziness, presyncope, or syncope.  The patient is otherwise without complaint today.   Past Medical History:  Diagnosis Date   Abdominal pain, left lower quadrant    Asthmatic bronchitis    Benign essential HTN 04/26/2016   Bursitis of hip    Chronic diastolic CHF (congestive heart failure) (HCC)    DCM (dilated cardiomyopathy) (HCC)    EF 45-50%   GERD (gastroesophageal reflux disease)    High cholesterol    History of kidney stones    Hypersomnia    Morbid obesity with BMI of 50.0-59.9, adult ([place])    OSA on CPAP    Osteoarthritis    "right hip; both knees" (09/21/2016)   Persistent atrial fibrillation ([place]) 04/26/2016   Type II diabetes mellitus ([place])    Vitamin D deficiency disease    Past Surgical History:  Procedure Laterality Date   ABDOMINAL HYSTERECTOMY     APPENDECTOMY     BALLOON DILATION N/A 11/17/2017   Procedure: BALLOON DILATION;  Surgeon: Ronnette Juniper, MD;  Location: [facility];  Service: Gastroenterology;  Laterality: N/A;   BIOPSY  11/17/2017   Procedure: BIOPSY;  Surgeon: Ronnette Juniper, MD;  Location: [facility];  Service: Gastroenterology;;   CARDIAC CATHETERIZATION     CARDIOVERSION N/A 06/11/2016   Procedure: CARDIOVERSION;  Surgeon: Dorothy Spark, MD;  Location: [facility]COPY;  Service: Cardiovascular;  Laterality: N/A;   CARDIOVERSION N/A 07/16/2016   Procedure: CARDIOVERSION;  Surgeon: Thayer Headings, MD;  Location: [facility] ENDOSCOPY;  Service: Cardiovascular;  Laterality: N/A;   CARDIOVERSION N/A 09/23/2016   Procedure:  CARDIOVERSION;  Surgeon: Sanda Klein, MD;  Location: [facility]OSCOPY;  Service: Cardiovascular;  Laterality: N/A;   CARDIOVERSION N/A 06/03/2020   Procedure: CARDIOVERSION;  Surgeon: Donato Heinz, MD;  Location: [facility] ENDOSCOPY;  Service: Cardiovascular;  Laterality: N/A;   CHOLECYSTECTOMY OPEN     COLONOSCOPY N/A 11/17/2017   Procedure: COLONOSCOPY;  Surgeon: Ronnette Juniper, MD;  Location: [facility];  Service: Gastroenterology;  Laterality: N/A;   cortisone injection to right knee     costisone injection to left knee  11/03/2017   CYSTOSCOPY WITH RETROGRADE PYELOGRAM, URETEROSCOPY AND STENT PLACEMENT Left 01/14/2019   Procedure: CYSTOSCOPy  STENT PLACEMENT;  Surgeon: Robley Fries, MD;  Location: [facility];  Service: Urology;  Laterality: Left;   CYSTOSCOPY WITH RETROGRADE PYELOGRAM, URETEROSCOPY AND STENT PLACEMENT Left 03/06/2019   Procedure: CYSTOSCOPY WITH RETROGRADE PYELOGRAM, URETEROSCOPY AND STENT PLACEMENT;  Surgeon: Robley Fries, MD;  Location: [facility];  Service: Urology;  Laterality: Left;  90 MINS   CYSTOSCOPY WITH STENT PLACEMENT Left 03/07/2019   Procedure: CYSTOSCOPY WITH , URETEROSCOPY/ STENT PLACEMENT;  Surgeon: Ardis Hughs, MD;  Location: [facility];  Service: Urology;  Laterality: Left;   DILATION AND CURETTAGE OF UTERUS     S/P miscarriage   ESOPHAGOGASTRODUODENOSCOPY N/A 11/17/2017   Procedure: ESOPHAGOGASTRODUODENOSCOPY (EGD);  Surgeon: Ronnette Juniper, MD;  Location: [facility];  Service: Gastroenterology;  Laterality: N/A;   HOLMIUM LASER APPLICATION Left 7/61/6073   Procedure: HOLMIUM LASER APPLICATION;  Surgeon: Claudia Desanctis,  Johny Chess, MD;  Location: [facility];  Service: Urology;  Laterality: Left;   NASAL SEPTUM SURGERY     POLYPECTOMY  11/17/2017   Procedure: POLYPECTOMY;  Surgeon: Ronnette Juniper, MD;  Location: [facility];  Service: Gastroenterology;;   TONSILLECTOMY      ROS- all systems are reviewed and negatives except as per HPI above  Current  Outpatient Medications  Medication Sig Dispense Refill   acetaminophen (TYLENOL) 650 MG CR tablet Take 650 mg by mouth 2 (two) times daily as needed for pain.     alendronate (FOSAMAX) 70 MG tablet Take 70 mg by mouth every Sunday. Take with a full glass of water on an empty stomach.     BESIVANCE 0.6 % SUSP Place 1 drop into the right eye 3 (three) times daily.     betamethasone dipropionate 0.05 % cream Apply 1 application topically 2 (two) times daily.     Biotin w/ Vitamins C & E (HAIR SKIN & NAILS GUMMIES PO) Take 2 tablets by mouth daily.      blood glucose meter kit and supplies Dispense based on patient and insurance preference. Use up to four times daily as directed. (FOR ICD-10 E10.9, E11.9). 1 each 0   Calcium-Vitamin D-Vitamin K (CALCIUM + D + K PO) Take 1 tablet by mouth in the morning.     carvedilol (COREG) 3.125 MG tablet TAKE 1 TABLET TWICE DAILY  WITH MEALS 180 tablet 3   Difluprednate 0.05 % EMUL Place 1 drop into the right eye 4 (four) times daily.     diphenhydrAMINE-zinc acetate (BENADRYL) cream Apply 1 application topically 3 (three) times daily as needed for itching.     dofetilide (TIKOSYN) 500 MCG capsule Take 1 capsule (500 mcg total) by mouth 2 (two) times daily. 180 capsule 4   fluocinolone (SYNALAR) 0.01 % external solution Apply 1 application topically 2 (two) times daily as needed (skin irritation).     folic acid (FOLVITE) 761 MCG tablet Take 800 mcg by mouth daily.     furosemide (LASIX) 20 MG tablet TAKE 2 TABLET (40 MG) EVERY OTHER DAY 180 tablet 3   gabapentin (NEURONTIN) 300 MG capsule Take 300 mg by mouth at bedtime.     HYDROcodone bit-homatropine (HYCODAN) 5-1.5 MG/5ML syrup Take 5 mLs by mouth every 6 (six) hours as needed.     HYDROcodone-acetaminophen (NORCO) 5-325 MG tablet Take 1 tablet by mouth every 4 (four) hours as needed for moderate pain (kidney stone pain). 20 tablet 0   Insulin Glargine (BASAGLAR KWIKPEN [place]) Inject 34 Units into the skin in the  morning.     Insulin Pen Needle 31G X 5 MM MISC 12 Units by Does not apply route 3 (three) times daily. 100 each 0   Ipratropium-Albuterol (COMBIVENT RESPIMAT) 20-100 MCG/ACT AERS respimat Inhale 1 puff into the lungs every 6 (six) hours as needed for wheezing or shortness of breath. Use 3 times daily x 5 days, then every 6 hours as needed. 4 g 1   metFORMIN (GLUCOPHAGE) 500 MG tablet Take 500 mg by mouth 2 (two) times daily with a meal.     Misc Natural Products (GLUCOSAMINE CHONDROITIN TRIPLE) TABS Take 2 tablets by mouth daily.      Multiple Vitamins-Minerals (CENTRUM SILVER) CHEW Chew 2 tablets by mouth daily. soft chews     pantoprazole (PROTONIX) 40 MG tablet Take 40 mg by mouth 2 (two) times daily.     Polyethyl Glycol-Propyl Glycol (SYSTANE) 0.4-0.3 % SOLN Place  1-2 drops into both eyes 3 (three) times daily as needed (dry/irritated eyes).     potassium chloride (KLOR-CON) 10 MEQ tablet TAKE 2 TABLETS BY MOUTH TWICE DAILY 360 tablet 3   PROLENSA 0.07 % SOLN Place 1 drop into the right eye every evening.     psyllium (METAMUCIL SMOOTH TEXTURE) 28 % packet Take 1 packet by mouth in the morning.     rivaroxaban (XARELTO) 20 MG TABS tablet Take 1 tablet (20 mg total) by mouth every evening. 90 tablet 3   simvastatin (ZOCOR) 10 MG tablet Take 1 tablet (10 mg total) by mouth at bedtime. 90 tablet 1   spironolactone (ALDACTONE) 25 MG tablet TAKE 1/2 TABLET (12.5MG    TOTAL) DAILY     triamcinolone cream (KENALOG) 0.1 % Apply 1 application topically 2 (two) times daily as needed (itching legs).      No current facility-administered medications for this visit.    Physical Exam: Vitals:   10/20/20 0943  BP: (!) 150/78  Pulse: 70  SpO2: 94%  Weight: 284 lb (128.8 kg)  Height: _0  (1.575 m)    GEN- The patient is well appearing, alert and oriented x 3 today.   Head- normocephalic, atraumatic Eyes-  Sclera clear, conjunctiva pink Ears- hearing intact Oropharynx- clear Lungs- Clear to  ausculation bilaterally, normal work of breathing Heart- Regular rate and rhythm, no murmurs, rubs or gallops, PMI not laterally displaced GI- soft, NT, ND, + BS Extremities- no clubbing, cyanosis, or edema  Wt Readings from Last 3 Encounters:  10/20/20 284 lb (128.8 kg)  09/23/20 272 lb (123.4 kg)  09/08/20 272 lb 9.6 oz (123.7 kg)    EKG tracing ordered today is personally reviewed and shows sinus rhythm, left  axis deviation, QTc 479 msec  Assessment and Plan:  Chronic diastolic dysfunction She has previous NYHA Class III CHF, currently NYHA Class II. I think that she will benefit from Alleviate HF trial. We have discussed at length. Risks and benefits to ILR were discussed at length with the patient today, including but not limited to risks of bleeding and infection.  Extensive device education was performed.  Remote monitoring was also discussed at length today.  The patient understands and wishes to proceed.  We will proceed at this time with ILR implantation.  2. persistent afib Maintaining sinus with tikosyn Labs 09/08/20 reviewed We will obtain bmet, mg today Chads2vasc score is 5.  Continue xarelto  3. Obesity Body mass index is 51.94 kg/m. Lifestyle modification is advised  4. HTN Stable No change required today   Thompson Grayer MD, [facility] 10/20/2020 10:10 AM      DESCRIPTION OF PROCEDURE:  Informed written consent was obtained.  The patient required no sedation for the procedure today.  The patients left chest was prepped and draped. Mapping over the patient's chest was performed to identify the appropriate ILR site.  This area was found to be the left parasternal region over the 3rd-4th intercostal space.  The skin overlying this region was infiltrated with lidocaine for local analgesia.  A 0.5-cm incision was made at the implant site.  A subcutaneous ILR pocket was fashioned using a combination of sharp and blunt dissection.  A Medtronic Reveal Linq model LNQ 11 (SN  [id number]) implantable loop recorder was then placed into the pocket R waves were very prominent and measured > 0.2 mV. EBL<1 ml.  Steri- Strips and a sterile dressing were then applied.  There were no early apparent complications.  CONCLUSIONS:   1. Successful implantation of a Medtronic Reveal LINQ implantable loop recorder for Alleviate HF trail  2. No early apparent complications.   Thompson Grayer MD, [facility] 10/20/2020 10:10 AM

## 2020-10-21 LAB — BASIC METABOLIC PANEL
BUN/Creatinine Ratio: 17 (ref 12–28)
BUN: 14 mg/dL (ref 8–27)
CO2: 23 mmol/L (ref 20–29)
Calcium: 9.4 mg/dL (ref 8.7–10.3)
Chloride: 100 mmol/L (ref 96–106)
Creatinine, Ser: 0.84 mg/dL (ref 0.57–1.00)
Glucose: 121 mg/dL — ABNORMAL HIGH (ref 65–99)
Potassium: 4.2 mmol/L (ref 3.5–5.2)
Sodium: 140 mmol/L (ref 134–144)
eGFR: 74 mL/min/{1.73_m2} (ref >59)

## 2020-10-21 LAB — CBC
Hematocrit: 41.5 % (ref 34.0–46.6)
Hemoglobin: 13 g/dL (ref 11.1–15.9)
MCH: 27.7 pg (ref 26.6–33.0)
MCHC: 31.3 g/dL — ABNORMAL LOW (ref 31.5–35.7)
MCV: 88 fL (ref 79–97)
Platelets: 254 10*3/uL (ref 150–450)
RBC: 4.7 x10E6/uL (ref 3.77–5.28)
RDW: 13.2 % (ref 11.7–15.4)
WBC: 9.3 10*3/uL (ref 3.4–10.8)

## 2020-10-21 LAB — BRAIN NATRIURETIC PEPTIDE: BNP: 37.7 pg/mL (ref 0.0–100.0)

## 2020-10-23 DIAGNOSIS — Z0000 Encounter for general adult medical examination without abnormal findings: Secondary | ICD-10-CM | POA: Diagnosis not present

## 2020-10-23 DIAGNOSIS — Z1389 Encounter for screening for other disorder: Secondary | ICD-10-CM | POA: Diagnosis not present

## 2020-10-26 ENCOUNTER — Other Ambulatory Visit: Payer: Self-pay | Admitting: Cardiology

## 2020-10-26 DIAGNOSIS — I4819 Other persistent atrial fibrillation: Secondary | ICD-10-CM

## 2020-10-27 NOTE — Telephone Encounter (Signed)
Prescription refill request for Xarelto received.  Indication:Afib  Last office visit:10/20/20 ([facility])  Weight: 128.8kg Age: 75 Scr: 0.84 (10/20/20) CrCl: 123.73m/min  Appropriate dose and refill sent to requested pharmacy.

## 2020-10-28 DIAGNOSIS — Z23 Encounter for immunization: Secondary | ICD-10-CM | POA: Diagnosis not present

## 2020-10-28 DIAGNOSIS — M810 Age-related osteoporosis without current pathological fracture: Secondary | ICD-10-CM | POA: Diagnosis not present

## 2020-10-28 DIAGNOSIS — R202 Paresthesia of skin: Secondary | ICD-10-CM | POA: Diagnosis not present

## 2020-10-28 DIAGNOSIS — E1169 Type 2 diabetes mellitus with other specified complication: Secondary | ICD-10-CM | POA: Diagnosis not present

## 2020-11-10 ENCOUNTER — Ambulatory Visit
Admission: RE | Admit: 2020-11-10 | Discharge: 2020-11-10 | Disposition: A | Discharge status: Home / Self Care | Payer: Medicare Other | Source: Ambulatory Visit | Attending: Family Medicine | Admitting: Family Medicine

## 2020-11-10 ENCOUNTER — Other Ambulatory Visit: Payer: Self-pay

## 2020-11-10 DIAGNOSIS — Z1231 Encounter for screening mammogram for malignant neoplasm of breast: Secondary | ICD-10-CM | POA: Diagnosis not present

## 2020-11-10 DIAGNOSIS — Z780 Asymptomatic menopausal state: Secondary | ICD-10-CM | POA: Diagnosis not present

## 2020-11-10 DIAGNOSIS — M810 Age-related osteoporosis without current pathological fracture: Secondary | ICD-10-CM | POA: Diagnosis not present

## 2020-11-10 DIAGNOSIS — M85832 Other specified disorders of bone density and structure, left forearm: Secondary | ICD-10-CM | POA: Diagnosis not present

## 2020-11-12 DIAGNOSIS — D1801 Hemangioma of skin and subcutaneous tissue: Secondary | ICD-10-CM | POA: Diagnosis not present

## 2020-11-12 DIAGNOSIS — L570 Actinic keratosis: Secondary | ICD-10-CM | POA: Diagnosis not present

## 2020-11-12 DIAGNOSIS — L820 Inflamed seborrheic keratosis: Secondary | ICD-10-CM | POA: Diagnosis not present

## 2020-11-12 DIAGNOSIS — L578 Other skin changes due to chronic exposure to nonionizing radiation: Secondary | ICD-10-CM | POA: Diagnosis not present

## 2020-11-12 DIAGNOSIS — L821 Other seborrheic keratosis: Secondary | ICD-10-CM | POA: Diagnosis not present

## 2020-11-12 DIAGNOSIS — L814 Other melanin hyperpigmentation: Secondary | ICD-10-CM | POA: Diagnosis not present

## 2020-11-17 DIAGNOSIS — K219 Gastro-esophageal reflux disease without esophagitis: Secondary | ICD-10-CM | POA: Diagnosis not present

## 2020-11-17 DIAGNOSIS — K224 Dyskinesia of esophagus: Secondary | ICD-10-CM | POA: Diagnosis not present

## 2020-11-17 DIAGNOSIS — R1013 Epigastric pain: Secondary | ICD-10-CM | POA: Diagnosis not present

## 2020-11-17 DIAGNOSIS — R1310 Dysphagia, unspecified: Secondary | ICD-10-CM | POA: Diagnosis not present

## 2020-11-24 ENCOUNTER — Ambulatory Visit: Payer: Medicare Other

## 2020-11-24 DIAGNOSIS — I4819 Other persistent atrial fibrillation: Secondary | ICD-10-CM

## 2020-12-03 NOTE — Progress Notes (Signed)
Carelink Summary Report / Loop Recorder 

## 2020-12-11 ENCOUNTER — Ambulatory Visit (HOSPITAL_COMMUNITY): Payer: Medicare Other | Admitting: Physician Assistant

## 2020-12-19 DIAGNOSIS — Z006 Encounter for examination for normal comparison and control in clinical research program: Secondary | ICD-10-CM

## 2020-12-19 NOTE — Research (Signed)
Alleviate Research Study   2 Month follow-up  Patient doing well at this time, no adverse event or medication changes to report.   Current Outpatient Medications:    acetaminophen (TYLENOL) 650 MG CR tablet, Take 650 mg by mouth 2 (two) times daily as needed for pain., Disp: , Rfl:    alendronate (FOSAMAX) 70 MG tablet, Take 70 mg by mouth every Sunday. Take with a full glass of water on an empty stomach., Disp: , Rfl:    BESIVANCE 0.6 % SUSP, Place 1 drop into the right eye 3 (three) times daily., Disp: , Rfl:    betamethasone dipropionate 0.05 % cream, Apply 1 application topically 2 (two) times daily., Disp: , Rfl:    Biotin w/ Vitamins C & E (HAIR SKIN & NAILS GUMMIES PO), Take 2 tablets by mouth daily. , Disp: , Rfl:    blood glucose meter kit and supplies, Dispense based on patient and insurance preference. Use up to four times daily as directed. (FOR ICD-10 E10.9, E11.9)., Disp: 1 each, Rfl: 0   Calcium-Vitamin D-Vitamin K (CALCIUM + D + K PO), Take 1 tablet by mouth in the morning., Disp: , Rfl:    carvedilol (COREG) 3.125 MG tablet, TAKE 1 TABLET TWICE DAILY  WITH MEALS, Disp: 180 tablet, Rfl: 3   Difluprednate 0.05 % EMUL, Place 1 drop into the right eye 4 (four) times daily., Disp: , Rfl:    diphenhydrAMINE-zinc acetate (BENADRYL) cream, Apply 1 application topically 3 (three) times daily as needed for itching., Disp: , Rfl:    dofetilide (TIKOSYN) 500 MCG capsule, Take 1 capsule (500 mcg total) by mouth 2 (two) times daily., Disp: 180 capsule, Rfl: 4   fluocinolone (SYNALAR) 0.01 % external solution, Apply 1 application topically 2 (two) times daily as needed (skin irritation)., Disp: , Rfl:    folic acid (FOLVITE) 096 MCG tablet, Take 800 mcg by mouth daily., Disp: , Rfl:    furosemide (LASIX) 20 MG tablet, TAKE 2 TABLET (40 MG) EVERY OTHER DAY, Disp: 180 tablet, Rfl: 3   furosemide (LASIX) 20 MG tablet, As needed patient may take 40 MG Lasix PRN by mouth QD x 4 days as directed  per Alleviate Research HF Study PRN plan., Disp: 90 tablet, Rfl: 3   gabapentin (NEURONTIN) 300 MG capsule, Take 300 mg by mouth at bedtime., Disp: , Rfl:    HYDROcodone bit-homatropine (HYCODAN) 5-1.5 MG/5ML syrup, Take 5 mLs by mouth every 6 (six) hours as needed., Disp: , Rfl:    HYDROcodone-acetaminophen (NORCO) 5-325 MG tablet, Take 1 tablet by mouth every 4 (four) hours as needed for moderate pain (kidney stone pain)., Disp: 20 tablet, Rfl: 0   Insulin Pen Needle 31G X 5 MM MISC, 12 Units by Does not apply route 3 (three) times daily., Disp: 100 each, Rfl: 0   Ipratropium-Albuterol (COMBIVENT RESPIMAT) 20-100 MCG/ACT AERS respimat, Inhale 1 puff into the lungs every 6 (six) hours as needed for wheezing or shortness of breath. Use 3 times daily x 5 days, then every 6 hours as needed., Disp: 4 g, Rfl: 1   metFORMIN (GLUCOPHAGE) 500 MG tablet, Take 500 mg by mouth 2 (two) times daily with a meal., Disp: , Rfl:    Misc Natural Products (GLUCOSAMINE CHONDROITIN TRIPLE) TABS, Take 2 tablets by mouth daily. , Disp: , Rfl:    Multiple Vitamins-Minerals (CENTRUM SILVER) CHEW, Chew 2 tablets by mouth daily. soft chews, Disp: , Rfl:    Polyethyl Glycol-Propyl Glycol (SYSTANE) 0.4-0.3 %  SOLN, Place 1-2 drops into both eyes 3 (three) times daily as needed (dry/irritated eyes)., Disp: , Rfl:    potassium chloride (KLOR-CON) 10 MEQ tablet, TAKE 2 TABLETS BY MOUTH TWICE DAILY, Disp: 360 tablet, Rfl: 3   PROLENSA 0.07 % SOLN, Place 1 drop into the right eye every evening., Disp: , Rfl:    psyllium (METAMUCIL SMOOTH TEXTURE) 28 % packet, Take 1 packet by mouth in the morning., Disp: , Rfl:    simvastatin (ZOCOR) 10 MG tablet, Take 1 tablet (10 mg total) by mouth at bedtime., Disp: 90 tablet, Rfl: 1   spironolactone (ALDACTONE) 25 MG tablet, TAKE 1/2 TABLET (12.5MG    TOTAL) DAILY, Disp: , Rfl:    triamcinolone cream (KENALOG) 0.1 %, Apply 1 application topically 2 (two) times daily as needed (itching legs). ,  Disp: , Rfl:    XARELTO 20 MG TABS tablet, TAKE 1 TABLET BY MOUTH EVERY DAY, Disp: 90 tablet, Rfl: 1   Insulin Glargine (BASAGLAR KWIKPEN [place]), Inject 34 Units into the skin in the morning., Disp: , Rfl:    pantoprazole (PROTONIX) 40 MG tablet, Take 40 mg by mouth 2 (two) times daily., Disp: , Rfl:

## 2021-01-27 ENCOUNTER — Ambulatory Visit: Payer: Medicare Other

## 2021-01-27 DIAGNOSIS — I4819 Other persistent atrial fibrillation: Secondary | ICD-10-CM | POA: Diagnosis not present

## 2021-02-05 NOTE — Progress Notes (Signed)
Carelink Summary Report / Loop Recorder 

## 2021-02-17 DIAGNOSIS — Z006 Encounter for examination for normal comparison and control in clinical research program: Secondary | ICD-10-CM

## 2021-02-17 NOTE — Research (Signed)
Alleviate Research Study  4 Month Follow-up  No adverse events or medication changes to report at this time.   Current Outpatient Medications:    acetaminophen (TYLENOL) 650 MG CR tablet, Take 650 mg by mouth 2 (two) times daily as needed for pain., Disp: , Rfl:    alendronate (FOSAMAX) 70 MG tablet, Take 70 mg by mouth every Sunday. Take with a full glass of water on an empty stomach., Disp: , Rfl:    BESIVANCE 0.6 % SUSP, Place 1 drop into the right eye 3 (three) times daily., Disp: , Rfl:    betamethasone dipropionate 0.05 % cream, Apply 1 application topically 2 (two) times daily., Disp: , Rfl:    Biotin w/ Vitamins C & E (HAIR SKIN & NAILS GUMMIES PO), Take 2 tablets by mouth daily. , Disp: , Rfl:    blood glucose meter kit and supplies, Dispense based on patient and insurance preference. Use up to four times daily as directed. (FOR ICD-10 E10.9, E11.9)., Disp: 1 each, Rfl: 0   Calcium-Vitamin D-Vitamin K (CALCIUM + D + K PO), Take 1 tablet by mouth in the morning., Disp: , Rfl:    carvedilol (COREG) 3.125 MG tablet, TAKE 1 TABLET TWICE DAILY  WITH MEALS, Disp: 180 tablet, Rfl: 3   Difluprednate 0.05 % EMUL, Place 1 drop into the right eye 4 (four) times daily., Disp: , Rfl:    diphenhydrAMINE-zinc acetate (BENADRYL) cream, Apply 1 application topically 3 (three) times daily as needed for itching., Disp: , Rfl:    dofetilide (TIKOSYN) 500 MCG capsule, Take 1 capsule (500 mcg total) by mouth 2 (two) times daily., Disp: 180 capsule, Rfl: 4   fluocinolone (SYNALAR) 0.01 % external solution, Apply 1 application topically 2 (two) times daily as needed (skin irritation)., Disp: , Rfl:    folic acid (FOLVITE) 937 MCG tablet, Take 800 mcg by mouth daily., Disp: , Rfl:    furosemide (LASIX) 20 MG tablet, TAKE 2 TABLET (40 MG) EVERY OTHER DAY, Disp: 180 tablet, Rfl: 3   furosemide (LASIX) 20 MG tablet, As needed patient may take 40 MG Lasix PRN by mouth QD x 4 days as directed per Alleviate Research  HF Study PRN plan., Disp: 90 tablet, Rfl: 3   gabapentin (NEURONTIN) 300 MG capsule, Take 300 mg by mouth at bedtime., Disp: , Rfl:    HYDROcodone bit-homatropine (HYCODAN) 5-1.5 MG/5ML syrup, Take 5 mLs by mouth every 6 (six) hours as needed., Disp: , Rfl:    HYDROcodone-acetaminophen (NORCO) 5-325 MG tablet, Take 1 tablet by mouth every 4 (four) hours as needed for moderate pain (kidney stone pain)., Disp: 20 tablet, Rfl: 0   Insulin Glargine (BASAGLAR KWIKPEN [place]), Inject 34 Units into the skin in the morning., Disp: , Rfl:    Insulin Pen Needle 31G X 5 MM MISC, 12 Units by Does not apply route 3 (three) times daily., Disp: 100 each, Rfl: 0   Ipratropium-Albuterol (COMBIVENT RESPIMAT) 20-100 MCG/ACT AERS respimat, Inhale 1 puff into the lungs every 6 (six) hours as needed for wheezing or shortness of breath. Use 3 times daily x 5 days, then every 6 hours as needed., Disp: 4 g, Rfl: 1   metFORMIN (GLUCOPHAGE) 500 MG tablet, Take 500 mg by mouth 2 (two) times daily with a meal., Disp: , Rfl:    Misc Natural Products (GLUCOSAMINE CHONDROITIN TRIPLE) TABS, Take 2 tablets by mouth daily. , Disp: , Rfl:    Multiple Vitamins-Minerals (CENTRUM SILVER) CHEW, Chew 2 tablets by  mouth daily. soft chews, Disp: , Rfl:    pantoprazole (PROTONIX) 40 MG tablet, Take 40 mg by mouth 2 (two) times daily., Disp: , Rfl:    Polyethyl Glycol-Propyl Glycol (SYSTANE) 0.4-0.3 % SOLN, Place 1-2 drops into both eyes 3 (three) times daily as needed (dry/irritated eyes)., Disp: , Rfl:    potassium chloride (KLOR-CON) 10 MEQ tablet, TAKE 2 TABLETS BY MOUTH TWICE DAILY, Disp: 360 tablet, Rfl: 3   PROLENSA 0.07 % SOLN, Place 1 drop into the right eye every evening., Disp: , Rfl:    psyllium (METAMUCIL SMOOTH TEXTURE) 28 % packet, Take 1 packet by mouth in the morning., Disp: , Rfl:    simvastatin (ZOCOR) 10 MG tablet, Take 1 tablet (10 mg total) by mouth at bedtime., Disp: 90 tablet, Rfl: 1   spironolactone (ALDACTONE) 25 MG  tablet, TAKE 1/2 TABLET (12.5MG    TOTAL) DAILY, Disp: , Rfl:    triamcinolone cream (KENALOG) 0.1 %, Apply 1 application topically 2 (two) times daily as needed (itching legs). , Disp: , Rfl:    XARELTO 20 MG TABS tablet, TAKE 1 TABLET BY MOUTH EVERY DAY, Disp: 90 tablet, Rfl: 1

## 2021-03-02 ENCOUNTER — Ambulatory Visit: Payer: Medicare Other

## 2021-03-02 DIAGNOSIS — I4819 Other persistent atrial fibrillation: Secondary | ICD-10-CM | POA: Diagnosis not present

## 2021-03-02 DIAGNOSIS — Z006 Encounter for examination for normal comparison and control in clinical research program: Secondary | ICD-10-CM

## 2021-03-12 NOTE — Progress Notes (Signed)
Carelink Summary Report / Loop Recorder 

## 2021-03-19 ENCOUNTER — Other Ambulatory Visit (HOSPITAL_COMMUNITY): Payer: Medicare Other

## 2021-03-20 DIAGNOSIS — I8312 Varicose veins of left lower extremity with inflammation: Secondary | ICD-10-CM | POA: Diagnosis not present

## 2021-03-20 DIAGNOSIS — L720 Epidermal cyst: Secondary | ICD-10-CM | POA: Diagnosis not present

## 2021-03-20 DIAGNOSIS — L820 Inflamed seborrheic keratosis: Secondary | ICD-10-CM | POA: Diagnosis not present

## 2021-03-20 DIAGNOSIS — L738 Other specified follicular disorders: Secondary | ICD-10-CM | POA: Diagnosis not present

## 2021-03-20 DIAGNOSIS — D1801 Hemangioma of skin and subcutaneous tissue: Secondary | ICD-10-CM | POA: Diagnosis not present

## 2021-03-20 DIAGNOSIS — D225 Melanocytic nevi of trunk: Secondary | ICD-10-CM | POA: Diagnosis not present

## 2021-03-20 DIAGNOSIS — D2272 Melanocytic nevi of left lower limb, including hip: Secondary | ICD-10-CM | POA: Diagnosis not present

## 2021-03-20 DIAGNOSIS — I872 Venous insufficiency (chronic) (peripheral): Secondary | ICD-10-CM | POA: Diagnosis not present

## 2021-03-20 DIAGNOSIS — I8311 Varicose veins of right lower extremity with inflammation: Secondary | ICD-10-CM | POA: Diagnosis not present

## 2021-03-20 DIAGNOSIS — L821 Other seborrheic keratosis: Secondary | ICD-10-CM | POA: Diagnosis not present

## 2021-03-24 ENCOUNTER — Telehealth: Payer: Self-pay | Admitting: Cardiology

## 2021-03-24 NOTE — Telephone Encounter (Signed)
Patient wants to know if the 18-25 compression sock would be okay for her.  She said the 15-20 is too tight for her around her calf, it leaves a line around her calf.

## 2021-03-24 NOTE — Telephone Encounter (Signed)
Pt aware Dr Radford Pax is okay with that level ./cy

## 2021-03-24 NOTE — Telephone Encounter (Signed)
Will forward to Dr Radford Pax for recommendations .Adonis Housekeeper

## 2021-03-25 ENCOUNTER — Ambulatory Visit: Payer: Medicare Other | Admitting: Cardiology

## 2021-04-02 ENCOUNTER — Telehealth: Payer: Self-pay | Admitting: Cardiology

## 2021-04-02 MED ORDER — SPIRONOLACTONE 25 MG PO TABS: ORAL_TABLET | ORAL | 2 refills | Status: DC

## 2021-04-02 NOTE — Telephone Encounter (Signed)
Pt's medication was sent to pt's pharmacy as requested. Confirmation received.  °

## 2021-04-02 NOTE — Telephone Encounter (Signed)
°*  STAT* If patient is at the pharmacy, call can be transferred to refill team.   1. Which medications need to be refilled? (please list name of each medication and dose if known)   spironolactone (ALDACTONE) 25 MG tablet  2. Which pharmacy/location (including street and city if local pharmacy) is medication to be sent to? [id number] - [place], [place] [address]  3. Do they need a 30 day or 90 day supply? 90 day

## 2021-04-03 ENCOUNTER — Ambulatory Visit (HOSPITAL_COMMUNITY): Payer: Medicare Other | Attending: Cardiology

## 2021-04-03 ENCOUNTER — Other Ambulatory Visit: Payer: Self-pay

## 2021-04-03 DIAGNOSIS — I5032 Chronic diastolic (congestive) heart failure: Secondary | ICD-10-CM

## 2021-04-03 DIAGNOSIS — I350 Nonrheumatic aortic (valve) stenosis: Secondary | ICD-10-CM | POA: Diagnosis not present

## 2021-04-03 DIAGNOSIS — I10 Essential (primary) hypertension: Secondary | ICD-10-CM

## 2021-04-03 DIAGNOSIS — I4819 Other persistent atrial fibrillation: Secondary | ICD-10-CM | POA: Diagnosis not present

## 2021-04-03 DIAGNOSIS — I3139 Other pericardial effusion (noninflammatory): Secondary | ICD-10-CM

## 2021-04-03 DIAGNOSIS — I420 Dilated cardiomyopathy: Secondary | ICD-10-CM | POA: Diagnosis not present

## 2021-04-03 LAB — ECHOCARDIOGRAM COMPLETE
Area-P 1/2: 4.86 cm2
S' Lateral: 2.9 cm

## 2021-04-04 ENCOUNTER — Other Ambulatory Visit: Payer: Self-pay | Admitting: Internal Medicine

## 2021-04-04 DIAGNOSIS — I4819 Other persistent atrial fibrillation: Secondary | ICD-10-CM

## 2021-04-06 NOTE — Telephone Encounter (Signed)
Prescription refill request for Xarelto received.  Indication: Afib  Last office visit: 10/20/20 ([facility])  Weight: 128.8kg Age: 76 Scr: 0.84 (10/20/20)  CrCl: 121.4ml/min  Appropriate dose and refill sent to requested pharmacy.

## 2021-04-10 ENCOUNTER — Encounter: Payer: Self-pay | Admitting: Cardiology

## 2021-04-10 ENCOUNTER — Other Ambulatory Visit: Payer: Self-pay

## 2021-04-10 ENCOUNTER — Telehealth (INDEPENDENT_AMBULATORY_CARE_PROVIDER_SITE_OTHER): Payer: Medicare Other | Admitting: Cardiology

## 2021-04-10 VITALS — Ht 62.0 in | Wt 282.0 lb

## 2021-04-10 DIAGNOSIS — I420 Dilated cardiomyopathy: Secondary | ICD-10-CM

## 2021-04-10 DIAGNOSIS — I10 Essential (primary) hypertension: Secondary | ICD-10-CM

## 2021-04-10 DIAGNOSIS — G4733 Obstructive sleep apnea (adult) (pediatric): Secondary | ICD-10-CM

## 2021-04-10 DIAGNOSIS — I5032 Chronic diastolic (congestive) heart failure: Secondary | ICD-10-CM

## 2021-04-10 DIAGNOSIS — I350 Nonrheumatic aortic (valve) stenosis: Secondary | ICD-10-CM

## 2021-04-10 DIAGNOSIS — I4819 Other persistent atrial fibrillation: Secondary | ICD-10-CM

## 2021-04-10 DIAGNOSIS — I3139 Other pericardial effusion (noninflammatory): Secondary | ICD-10-CM | POA: Diagnosis not present

## 2021-04-10 MED ORDER — COLCHICINE 0.6 MG PO TABS: 0.6000 mg | ORAL_TABLET | Freq: Two times a day (BID) | ORAL | 3 refills | Status: DC

## 2021-04-10 NOTE — Progress Notes (Signed)
Virtual Visit via Video Note   This visit type was conducted due to national recommendations for restrictions regarding the COVID-19 Pandemic (e.g. social distancing) in an effort to limit this patient's exposure and mitigate transmission in our community.  Due to her co-morbid illnesses, this patient is at least at moderate risk for complications without adequate follow up.  This format is felt to be most appropriate for this patient at this time.  All issues noted in this document were discussed and addressed.  A limited physical exam was performed with this format.  Please refer to the patient's chart for her consent to telehealth for [facility].  Date:  04/10/2021   ID:  Karen Rasmussen, DOB [birth year], MRN [id number] The patient was identified using 2 identifiers. Patient Location: Home Provider Location: Office/Clinic   PCP:  Kathyrn Lass, MD   [facility] Providers Cardiologist:  Fransico Him, MD   Evaluation Performed:  Follow-Up Visit  Chief Complaint:  CHF, PAF, DCM, OSA  History of Present Illness:    LYNSEY Rasmussen is a 76 y.o. female with history of normal coronary arteries with calcium score of 0 on coronary CTA in 03/4495, chronic diastolic CHF/non-ischemic cardiomyopathy with EF of 45-50%, paroxysmal atrial fibrillation s/p DCCV x2 on Xarelto, obstructive sleep apnea on CPAP, hypertension, diabetes mellitus, and morbid obesity.  She had a reoccurrence of her afib and was seen in afib clinic and her Betapace was switched to Tikosyn. 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   She is here today for followup and is doing well.  She tells me that she had a stabbing pain that starter in her sternum and radiated across both sides of her chest around to her back and awakened her from sleep.  She has had this during the day but only occurs with rest.  There is no associated SOB, nausea, diaphoresis with the pain.  The pain is not positional or pleuritic.   She denies any SOB, DOE, PND, orthopnea, dizziness, palpitations or syncope. She has chronic LE edema which is stable on diuretics and compression hose.  She is compliant with her meds and is tolerating meds with no SE.     She is doing well with her CPAP device and thinks that she has gotten used to it.  She tolerates the mask and feels the pressure is adequate.  Since going on CPAP she feels rested in the am and has no significant daytime sleepiness.  She denies any significant mouth or nasal dryness or nasal congestion.  She does not think that he snores.     The patient does not have symptoms concerning for COVID-19 infection (fever, chills, cough, or new shortness of breath).   Past Medical History:  Diagnosis Date   Abdominal pain, left lower quadrant    Asthmatic bronchitis    Benign essential HTN 04/26/2016   Bursitis of hip    Chronic diastolic CHF (congestive heart failure) (HCC)    DCM (dilated cardiomyopathy) (HCC)    EF 45-50%   GERD (gastroesophageal reflux disease)    High cholesterol    History of kidney stones    Hypersomnia    Morbid obesity with BMI of 50.0-59.9, adult ([place])    OSA on CPAP    Osteoarthritis    "right hip; both knees" (09/21/2016)   Persistent atrial fibrillation ([place]) 04/26/2016   Type II diabetes mellitus ([place])    Vitamin D deficiency disease  Past Surgical History:  Procedure Laterality Date   ABDOMINAL HYSTERECTOMY     APPENDECTOMY     BALLOON DILATION N/A 11/17/2017   Procedure: BALLOON DILATION;  Surgeon: Ronnette Juniper, MD;  Location: [facility];  Service: Gastroenterology;  Laterality: N/A;   BIOPSY  11/17/2017   Procedure: BIOPSY;  Surgeon: Ronnette Juniper, MD;  Location: [facility];  Service: Gastroenterology;;   CARDIAC CATHETERIZATION     CARDIOVERSION N/A 06/11/2016   Procedure: CARDIOVERSION;  Surgeon: Dorothy Spark, MD;  Location: [place];  Service: Cardiovascular;  Laterality: N/A;   CARDIOVERSION N/A 07/16/2016    Procedure: CARDIOVERSION;  Surgeon: Thayer Headings, MD;  Location: [facility] ENDOSCOPY;  Service: Cardiovascular;  Laterality: N/A;   CARDIOVERSION N/A 09/23/2016   Procedure: CARDIOVERSION;  Surgeon: Sanda Klein, MD;  Location: [place] [facility];  Service: Cardiovascular;  Laterality: N/A;   CARDIOVERSION N/A 06/03/2020   Procedure: CARDIOVERSION;  Surgeon: Donato Heinz, MD;  Location: [facility] END[facility]COPY;  Service: Cardiovascular;  Laterality: N/A;   CHOLECYSTECTOMY OPEN     COLONOSCOPY N/A 11/17/2017   Procedure: COLONOSCOPY;  Surgeon: Ronnette Juniper, MD;  Location: [facility];  Service: Gastroenterology;  Laterality: N/A;   cortisone injection to right knee     costisone injection to left knee  11/03/2017   CYSTOSCOPY WITH RETROGRADE PYELOGRAM, URETEROSCOPY AND STENT PLACEMENT Left 01/14/2019   Procedure: CYSTOSCOPy  STENT PLACEMENT;  Surgeon: Robley Fries, MD;  Location: [facility];  Service: Urology;  Laterality: Left;   CYSTOSCOPY WITH RETROGRADE PYELOGRAM, URETEROSCOPY AND STENT PLACEMENT Left 03/06/2019   Procedure: CYSTOSCOPY WITH RETROGRADE PYELOGRAM, URETEROSCOPY AND STENT PLACEMENT;  Surgeon: Robley Fries, MD;  Location: [facility];  Service: Urology;  Laterality: Left;  90 MINS   CYSTOSCOPY WITH STENT PLACEMENT Left 03/07/2019   Procedure: CYSTOSCOPY WITH , URETEROSCOPY/ STENT PLACEMENT;  Surgeon: Ardis Hughs, MD;  Location: [facility];  Service: Urology;  Laterality: Left;   DILATION AND CURETTAGE OF UTERUS     S/P miscarriage   ESOPHAGOGASTRODUODENOSCOPY N/A 11/17/2017   Procedure: ESOPHAGOGASTRODUODENOSCOPY (EGD);  Surgeon: Ronnette Juniper, MD;  Location: [facility];  Service: Gastroenterology;  Laterality: N/A;   HOLMIUM LASER APPLICATION Left 21/22/4825   Procedure: HOLMIUM LASER APPLICATION;  Surgeon: Robley Fries, MD;  Location: [facility];  Service: Urology;  Laterality: Left;   implantable loop recorder placement  10/20/2020   Medtronic Reveal Linq model LNQ 11 (SN  [id number]) implantable loop recorder implanted for Alleviate HF trial   NASAL SEPTUM SURGERY     POLYPECTOMY  11/17/2017   Procedure: POLYPECTOMY;  Surgeon: Ronnette Juniper, MD;  Location: [facility];  Service: Gastroenterology;;   TONSILLECTOMY       Current Meds  Medication Sig   acetaminophen (TYLENOL) 650 MG CR tablet Take 650 mg by mouth 2 (two) times daily as needed for pain.   alendronate (FOSAMAX) 70 MG tablet Take 70 mg by mouth every Sunday. Take with a full glass of water on an empty stomach.   betamethasone dipropionate 0.05 % cream Apply 1 application topically 2 (two) times daily.   Biotin w/ Vitamins C & E (HAIR SKIN & NAILS GUMMIES PO) Take 2 tablets by mouth daily.    blood glucose meter kit and supplies Dispense based on patient and insurance preference. Use up to four times daily as directed. (FOR ICD-10 E10.9, E11.9).   Calcium-Vitamin D-Vitamin K (CALCIUM + D + K PO) Take 1 tablet by mouth in the morning.   carvedilol (COREG) 3.125 MG tablet  TAKE 1 TABLET TWICE DAILY  WITH MEALS   Difluprednate 0.05 % EMUL Place 1 drop into the right eye 4 (four) times daily.   diphenhydrAMINE-zinc acetate (BENADRYL) cream Apply 1 application topically 3 (three) times daily as needed for itching.   dofetilide (TIKOSYN) 500 MCG capsule Take 1 capsule (500 mcg total) by mouth 2 (two) times daily.   fluocinolone (SYNALAR) 0.01 % external solution Apply 1 application topically 2 (two) times daily as needed (skin irritation).   folic acid (FOLVITE) 597 MCG tablet Take 800 mcg by mouth daily.   furosemide (LASIX) 20 MG tablet As needed patient may take 40 MG Lasix PRN by mouth QD x 4 days as directed per Alleviate Research HF Study PRN plan.   gabapentin (NEURONTIN) 300 MG capsule Take 300 mg by mouth at bedtime.   HYDROcodone-acetaminophen (NORCO) 5-325 MG tablet Take 1 tablet by mouth every 4 (four) hours as needed for moderate pain (kidney stone pain).   Insulin Glargine (BASAGLAR KWIKPEN [place])  Inject 34 Units into the skin in the morning.   Insulin Pen Needle 31G X 5 MM MISC 12 Units by Does not apply route 3 (three) times daily.   metFORMIN (GLUCOPHAGE) 500 MG tablet Take 500 mg by mouth 2 (two) times daily with a meal.   Misc Natural Products (GLUCOSAMINE CHONDROITIN TRIPLE) TABS Take 2 tablets by mouth daily.    Multiple Vitamins-Minerals (CENTRUM SILVER) CHEW Chew 2 tablets by mouth daily. soft chews   pantoprazole (PROTONIX) 40 MG tablet Take 40 mg by mouth 2 (two) times daily.   Polyethyl Glycol-Propyl Glycol (SYSTANE) 0.4-0.3 % SOLN Place 1-2 drops into both eyes 3 (three) times daily as needed (dry/irritated eyes).   potassium chloride (KLOR-CON) 10 MEQ tablet TAKE 2 TABLETS BY MOUTH TWICE DAILY   psyllium (METAMUCIL SMOOTH TEXTURE) 28 % packet Take 1 packet by mouth in the morning.   simvastatin (ZOCOR) 10 MG tablet Take 1 tablet (10 mg total) by mouth at bedtime.   spironolactone (ALDACTONE) 25 MG tablet TAKE 1/2 TABLET (12.5MG    TOTAL) DAILY   triamcinolone cream (KENALOG) 0.1 % Apply 1 application topically 2 (two) times daily as needed (itching legs).    XARELTO 20 MG TABS tablet TAKE 1 TABLET BY MOUTH EVERY DAY     Allergies:   Diphenhydramine and No healthtouch food allergies   Social History   Tobacco Use   Smoking status: Never   Smokeless tobacco: Never  Vaping Use   Vaping Use: Never used  Substance Use Topics   Alcohol use: Not Currently   Drug use: No     Family Hx: The patient's family history includes CAD in her mother; Cancer in her father; Diabetes in her brother and father; Heart failure in her father; Hypertension in her brother. There is no history of Breast cancer.  ROS:   Please see the history of present illness.     All other systems reviewed and are negative.   Prior CV studies:   The following studies were reviewed today:  PAP download  Labs/Other Tests and Data Reviewed:    EKG:  No ECG reviewed.  Recent Labs: 09/08/2020:  Magnesium 2.0 10/20/2020: BNP 37.7; BUN 14; Creatinine, Ser 0.84; Hemoglobin 13.0; Platelets 254; Potassium 4.2; Sodium 140   Recent Lipid Panel No results found for: CHOL, TRIG, HDL, CHOLHDL, LDLCALC, LDLDIRECT  Wt Readings from Last 3 Encounters:  04/10/21 282 lb (127.9 kg)  10/20/20 284 lb (128.8 kg)  10/20/20 284 lb (  128.8 kg)     Risk Assessment/Calculations:    CHA2DS2-VASc Score = 5  This indicates a 7.2% annual risk of stroke. The patient's score is based upon: CHF History: 1 HTN History: 1 Diabetes History: 1 Stroke History: 0 Vascular Disease History: 0 Age Score: 1 Gender Score: 1    Objective:    Vital Signs:  Ht 5' 2"  (1.575 m)    Wt 282 lb (127.9 kg)    BMI 51.58 kg/m    VITAL SIGNS:  reviewed GEN:  no acute distress EYES:  sclerae anicteric, EOMI - Extraocular Movements Intact RESPIRATORY:  normal respiratory effort, symmetric expansion CARDIOVASCULAR:  no peripheral edema SKIN:  no rash, lesions or ulcers. MUSCULOSKELETAL:  no obvious deformities. NEURO:  alert and oriented x 3, no obvious focal deficit PSYCH:  normal affect  ASSESSMENT & PLAN:    Dilated cardiomyopathy  -EF 45 to 50% on echo 05/2016 and 55% on echo 02/2019.  Repeat echo 07/2020 with EF 50-55% but was in setting of afib -coronary calcium score 0 and normal coronaries on coronary CT 09/2016   Chronic diastolic CHF -echo 10/7351 LVEF 55-60% mod LVH G2DD  -her weight has been stable and denies any SOB or LE edema -continue prescription drug management with Lasix 42m  QOD, Spiro 12.564mdaily and Carvedilol 3.1258mID with PRN refills.  -I have personally reviewed and interpreted outside labs performed by patient's PCP which showed SCr 0.84 and K+ 4.2 in Aug 2022   Essential hypertension  -BP controlled at home -continue prescription drug management with Carvedilol 3.125m52mD and Spiro 12.5mg 69mly with PRN refills   Persistent atrial fibrillation  -s/p DCCV 09/2016  -she had a  reoccurrence of her afib and was seen in afib clinic and her Betapace was stopped and is now on Dofetilide 500mcg38m -she thinks she is maintaining NSR and denies any palpitations or bleeding problems on the DOAC -Continue prescription drug management with Xarelto 20mg d2m, Dofetilide 500mcg B65mnd Carvedilol 3.125mg BID56mh PRN refills -I have personally reviewed and interpreted outside PCP performed by patient's PCP which showed Hbg 13 and SCr 0.84 in Sept 2022   Aortic stenosis -mild on echo 02/2019  -repeat echo 02/2021 showed no AS and trivial AR   OSA - The patient is tolerating PAP therapy well without any problems. The PAP download performed by his DME was personally reviewed and interpreted by me today and showed an AHI of 0.8/hr on auto PAP with 87% compliance in using more than 4 hours nightly.  The patient has been using and benefiting from PAP use and will continue to benefit from therapy.      Pericardial effusion -small on echo a year ago -stable small effusion on echo 02/2021  Chest pain  -atypical sharp and nonexertional -she had a normal coronary CTA in 2018 with coronary Ca score  0 and I do not think that her CP is related to coronary ischemia -she has a hx of esophageal dilatation and is concerned that it may be her esophagus -she does have a pericardial effusion on echo but small but will treat for possible pericarditis -start Colchicine 0.6mg BID -28mlowup with me in 4 weeks for recheck -she is going to get an appt with her GI MD    COVID-19 Education: The signs and symptoms of COVID-19 were discussed with the patient and how to seek care for testing (follow up with PCP or arrange E-visit).  The importance of social distancing was  discussed today.  Time:   Today, I have spent 20 minutes with the patient with telehealth technology discussing the above problems.     Medication Adjustments/Labs and Tests Ordered: Current medicines are reviewed at length with the  patient today.  Concerns regarding medicines are outlined above.   Tests Ordered: No orders of the defined types were placed in this encounter.    Medication Changes: No orders of the defined types were placed in this encounter.    Follow Up:  In Person in 6 months  Signed, Fransico Him, MD  04/10/2021 10:12 AM    [place]

## 2021-04-10 NOTE — Addendum Note (Signed)
Addended by: Antonieta Iba on: 04/10/2021 10:17 AM ? ? Modules accepted: Orders ? ?

## 2021-04-10 NOTE — Patient Instructions (Signed)
Medication Instructions:  ?Your physician has recommended you make the following change in your medication: ?1) START taking colchicine 0.6 mg twice daily  ? ?*If you need a refill on your cardiac medications before your next appointment, please call your pharmacy* ?Follow-Up: ?At [facility], you and your health needs are our priority.  As part of our continuing mission to provide you with exceptional heart care, we have created designated Provider Care Teams.  These Care Teams include your primary Cardiologist (physician) and Advanced Practice Providers (APPs -  Physician Assistants and Nurse Practitioners) who all work together to provide you with the care you need, when you need it. ? ?Your next appointment:   ?4 week(s) ? ?The format for your next appointment:   ?In Person ? ?Provider:   ?Fransico Him, MD{ ? ?

## 2021-04-20 VITALS — BP 140/58 | HR 62 | Temp 98.8°F | Wt 292.0 lb

## 2021-04-20 DIAGNOSIS — Z006 Encounter for examination for normal comparison and control in clinical research program: Secondary | ICD-10-CM

## 2021-04-20 DIAGNOSIS — I509 Heart failure, unspecified: Secondary | ICD-10-CM

## 2021-04-20 NOTE — Research (Signed)
Alleviate 6 Month Follow-up ? ?Patient doing well at this time, no adverse events to report. ?NYHA ll ?Trace edema lower legs. ?No 6 MHWT performed due to orthopedic issue with knees. ?Labs drawn. ? ? ?Current Outpatient Medications:  ?  acetaminophen (TYLENOL) 650 MG CR tablet, Take 650 mg by mouth 2 (two) times daily as needed for pain., Disp: , Rfl:  ?  alendronate (FOSAMAX) 70 MG tablet, Take 70 mg by mouth every Sunday. Take with a full glass of water on an empty stomach., Disp: , Rfl:  ?  BESIVANCE 0.6 % SUSP, Place 1 drop into the right eye 3 (three) times daily., Disp: , Rfl:  ?  betamethasone dipropionate 0.05 % cream, Apply 1 application topically 2 (two) times daily., Disp: , Rfl:  ?  Biotin w/ Vitamins C & E (HAIR SKIN & NAILS GUMMIES PO), Take 2 tablets by mouth daily. , Disp: , Rfl:  ?  blood glucose meter kit and supplies, Dispense based on patient and insurance preference. Use up to four times daily as directed. (FOR ICD-10 E10.9, E11.9)., Disp: 1 each, Rfl: 0 ?  Calcium-Vitamin D-Vitamin K (CALCIUM + D + K PO), Take 1 tablet by mouth in the morning., Disp: , Rfl:  ?  carvedilol (COREG) 3.125 MG tablet, TAKE 1 TABLET TWICE DAILY  WITH MEALS, Disp: 180 tablet, Rfl: 3 ?  colchicine 0.6 MG tablet, Take 1 tablet (0.6 mg total) by mouth 2 (two) times daily., Disp: 180 tablet, Rfl: 3 ?  Difluprednate 0.05 % EMUL, Place 1 drop into the right eye 4 (four) times daily., Disp: , Rfl:  ?  diphenhydrAMINE-zinc acetate (BENADRYL) cream, Apply 1 application topically 3 (three) times daily as needed for itching., Disp: , Rfl:  ?  dofetilide (TIKOSYN) 500 MCG capsule, Take 1 capsule (500 mcg total) by mouth 2 (two) times daily., Disp: 180 capsule, Rfl: 4 ?  fluocinolone (SYNALAR) 0.01 % external solution, Apply 1 application topically 2 (two) times daily as needed (skin irritation)., Disp: , Rfl:  ?  folic acid (FOLVITE) 825 MCG tablet, Take 800 mcg by mouth daily., Disp: , Rfl:  ?  furosemide (LASIX) 20 MG tablet,  TAKE 2 TABLET (40 MG) EVERY OTHER DAY (Patient taking differently: 20 mg daily.), Disp: 180 tablet, Rfl: 3 ?  furosemide (LASIX) 20 MG tablet, As needed patient may take 40 MG Lasix PRN by mouth QD x 4 days as directed per Alleviate Research HF Study PRN plan., Disp: 90 tablet, Rfl: 3 ?  gabapentin (NEURONTIN) 300 MG capsule, Take 300 mg by mouth at bedtime., Disp: , Rfl:  ?  HYDROcodone bit-homatropine (HYCODAN) 5-1.5 MG/5ML syrup, Take 5 mLs by mouth daily., Disp: , Rfl:  ?  HYDROcodone-acetaminophen (NORCO) 5-325 MG tablet, Take 1 tablet by mouth every 4 (four) hours as needed for moderate pain (kidney stone pain)., Disp: 20 tablet, Rfl: 0 ?  Insulin Glargine (BASAGLAR KWIKPEN [place]), Inject 34 Units into the skin in the morning., Disp: , Rfl:  ?  Insulin Pen Needle 31G X 5 MM MISC, 12 Units by Does not apply route 3 (three) times daily., Disp: 100 each, Rfl: 0 ?  metFORMIN (GLUCOPHAGE) 500 MG tablet, Take 500 mg by mouth 2 (two) times daily with a meal., Disp: , Rfl:  ?  Misc Natural Products (GLUCOSAMINE CHONDROITIN TRIPLE) TABS, Take 2 tablets by mouth daily. , Disp: , Rfl:  ?  Multiple Vitamins-Minerals (CENTRUM SILVER) CHEW, Chew 2 tablets by mouth daily. soft chews, Disp: ,  Rfl:  ?  pantoprazole (PROTONIX) 40 MG tablet, Take 40 mg by mouth 2 (two) times daily., Disp: , Rfl:  ?  Polyethyl Glycol-Propyl Glycol (SYSTANE) 0.4-0.3 % SOLN, Place 1-2 drops into both eyes 3 (three) times daily as needed (dry/irritated eyes)., Disp: , Rfl:  ?  potassium chloride (KLOR-CON) 10 MEQ tablet, TAKE 2 TABLETS BY MOUTH TWICE DAILY, Disp: 360 tablet, Rfl: 3 ?  PROLENSA 0.07 % SOLN, Place 1 drop into the right eye every evening., Disp: , Rfl:  ?  psyllium (METAMUCIL SMOOTH TEXTURE) 28 % packet, Take 1 packet by mouth in the morning., Disp: , Rfl:  ?  simvastatin (ZOCOR) 10 MG tablet, Take 1 tablet (10 mg total) by mouth at bedtime., Disp: 90 tablet, Rfl: 1 ?  spironolactone (ALDACTONE) 25 MG tablet, TAKE 1/2 TABLET (12.5MG     TOTAL) DAILY, Disp: 45 tablet, Rfl: 2 ?  triamcinolone cream (KENALOG) 0.1 %, Apply 1 application topically 2 (two) times daily as needed (itching legs). , Disp: , Rfl:  ?  XARELTO 20 MG TABS tablet, TAKE 1 TABLET BY MOUTH EVERY DAY, Disp: 90 tablet, Rfl: 1 ?  Ipratropium-Albuterol (COMBIVENT RESPIMAT) 20-100 MCG/ACT AERS respimat, Inhale 1 puff into the lungs every 6 (six) hours as needed for wheezing or shortness of breath. Use 3 times daily x 5 days, then every 6 hours as needed., Disp: 4 g, Rfl: 1  ? ? ? ? ?

## 2021-04-21 LAB — CBC
Hematocrit: 39.1 % (ref 34.0–46.6)
Hemoglobin: 12.4 g/dL (ref 11.1–15.9)
MCH: 27.8 pg (ref 26.6–33.0)
MCHC: 31.7 g/dL (ref 31.5–35.7)
MCV: 88 fL (ref 79–97)
Platelets: 222 10*3/uL (ref 150–450)
RBC: 4.46 x10E6/uL (ref 3.77–5.28)
RDW: 13.2 % (ref 11.7–15.4)
WBC: 8.6 10*3/uL (ref 3.4–10.8)

## 2021-04-21 LAB — BASIC METABOLIC PANEL
BUN/Creatinine Ratio: 19 (ref 12–28)
BUN: 15 mg/dL (ref 8–27)
CO2: 23 mmol/L (ref 20–29)
Calcium: 9.9 mg/dL (ref 8.7–10.3)
Chloride: 104 mmol/L (ref 96–106)
Creatinine, Ser: 0.8 mg/dL (ref 0.57–1.00)
Glucose: 137 mg/dL — ABNORMAL HIGH (ref 70–99)
Potassium: 5.1 mmol/L (ref 3.5–5.2)
Sodium: 139 mmol/L (ref 134–144)
eGFR: 78 mL/min/{1.73_m2} (ref >59)

## 2021-04-21 LAB — BRAIN NATRIURETIC PEPTIDE: BNP: 46.8 pg/mL (ref 0.0–100.0)

## 2021-04-22 NOTE — Progress Notes (Signed)
Please review labs. 

## 2021-04-27 DIAGNOSIS — Z79899 Other long term (current) drug therapy: Secondary | ICD-10-CM | POA: Diagnosis not present

## 2021-04-27 DIAGNOSIS — E1169 Type 2 diabetes mellitus with other specified complication: Secondary | ICD-10-CM | POA: Diagnosis not present

## 2021-04-27 DIAGNOSIS — E785 Hyperlipidemia, unspecified: Secondary | ICD-10-CM | POA: Diagnosis not present

## 2021-04-27 DIAGNOSIS — M160 Bilateral primary osteoarthritis of hip: Secondary | ICD-10-CM | POA: Diagnosis not present

## 2021-04-27 DIAGNOSIS — M810 Age-related osteoporosis without current pathological fracture: Secondary | ICD-10-CM | POA: Diagnosis not present

## 2021-04-28 ENCOUNTER — Telehealth: Payer: Self-pay | Admitting: Cardiology

## 2021-04-28 NOTE — Telephone Encounter (Signed)
Pt c/o medication issue: ? ?1. Name of Medication: colchicine 0.6 MG tablet ? ?2. How are you currently taking this medication (dosage and times per day)? Not currently taking  ? ?3. Are you having a reaction (difficulty breathing--STAT)? No  ? ?4. What is your medication issue? Patient states she is not going to take this medication due to the pharmacy advising her when she went to pick it up that it interfered with her simvastatin. Pt saw Dr. Sabra Heck yesterday and was advised that she needs to stay on her simvastatin. Patient reports she is doing fine.  ?

## 2021-04-28 NOTE — Telephone Encounter (Signed)
Spoke with the patient who reports that she did not start on the colchicine. She was told that she could not take simvastatin while on the colchicine and she prefers to stay on colchicine. She is planning to follow up with her GI doctor in regards to her symptoms.  ?

## 2021-05-07 NOTE — Progress Notes (Signed)
? ?Virtual Visit via Video Note  ? ?This visit type was conducted due to national recommendations for restrictions regarding the COVID-19 Pandemic (e.g. social distancing) in an effort to limit this patient's exposure and mitigate transmission in our community.  Due to her co-morbid illnesses, this patient is at least at moderate risk for complications without adequate follow up.  This format is felt to be most appropriate for this patient at this time.  All issues noted in this document were discussed and addressed.  A limited physical exam was performed with this format.  Please refer to the patient's chart for her consent to telehealth for [facility]. ? ?Date:  05/08/2021  ? ?ID:  Karen Rasmussen, DOB 1945-02-14, MRN [id number] ?The patient was identified using 2 identifiers. ?Patient Location: Home ?Provider Location: Office/Clinic ? ? ?PCP:  Kathyrn Lass, MD ?  ?[facility] Providers ?Cardiologist:  Fransico Him, MD  ? ?Evaluation Performed:  Follow-Up Visit ? ?Chief Complaint:  CHF, PAF, DCM, OSA ? ?History of Present Illness:   ? ?Karen Rasmussen is a 76 y.o. female with history of normal coronary arteries with calcium score of 0 on coronary CTA in 04/1495, chronic diastolic CHF/non-ischemic cardiomyopathy with EF of 50-55%, paroxysmal atrial fibrillation s/p DCCV x2 on Xarelto, obstructive sleep apnea on CPAP, hypertension, diabetes mellitus, and morbid obesity.  She had a reoccurrence of her afib and was seen in afib clinic and her Betapace was switched to Tikosyn. 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 ? ?When I last saw her she was complaining of sharp stabbing chest pain in her sternum that radiated across both sides of her chest and around to her back which would awaken her sometimes from sleep.  She also had a during the day.  There was no exertional component and no associated symptoms of shortness of breath nausea or diaphoresis with the pain.  It was not felt to be coronary  ischemia as she had a normal coronary CTA in 2018 with a coronary calcium score of 0.  She does have a history of esophageal dilatation and there was some concern that it may be her esophagus.  She also was found to have a pericardial effusion on echo so the decision was made to treat her for possible pericarditis and she was started on colchicine 0.6 mg twice daily.  She is now back for follow-up. ?  ?She is here today for followup and is doing well.  She decided not to take the colchicine because it interfered with her simvastatin.  She has not had any further sharp CP since I saw her last.  She thinks that the CP is related to her esophagus as she gets choked some when she eats.  She is going to make an appt with her Gi MD.  She denies any chest pain or pressure, SOB, DOE, PND, orthopnea,  dizziness, palpitations or syncope. She has chronic LE edema controlled with diuretics. She is compliant with her meds and is tolerating meds with no SE.    ? ?The patient does not have symptoms concerning for COVID-19 infection (fever, chills, cough, or new shortness of breath).  ? ?Past Medical History:  ?Diagnosis Date  ? Abdominal pain, left lower quadrant   ? Asthmatic bronchitis   ? Benign essential HTN 04/26/2016  ? Bursitis of hip   ? Chronic diastolic CHF (congestive heart failure) ([place])   ? DCM (dilated cardiomyopathy) ([place])   ? EF 50-55%  in Jan 2023  ? GERD (gastroesophageal reflux disease)   ? High cholesterol   ? History of kidney stones   ? Hypersomnia   ? Morbid obesity with BMI of 50.0-59.9, adult ([place])   ? OSA on CPAP   ? Osteoarthritis   ? "right hip; both knees" (09/21/2016)  ? Persistent atrial fibrillation ([place]) 04/26/2016  ? Type II diabetes mellitus ([place])   ? Vitamin D deficiency disease   ? ?Past Surgical History:  ?Procedure Laterality Date  ? ABDOMINAL HYSTERECTOMY    ? APPENDECTOMY    ? BALLOON DILATION N/A 11/17/2017  ? Procedure: BALLOON DILATION;  Surgeon: Ronnette Juniper, MD;  Location: [facility];   Service: Gastroenterology;  Laterality: N/A;  ? BIOPSY  11/17/2017  ? Procedure: BIOPSY;  Surgeon: Ronnette Juniper, MD;  Location: [facility];  Service: Gastroenterology;;  ? CARDIAC CATHETERIZATION    ? CARDIOVERSION N/A 06/11/2016  ? Procedure: CARDIOVERSION;  Surgeon: Dorothy Spark, MD;  Location: [place];  Service: Cardiovascular;  Laterality: N/A;  ? CARDIOVERSION N/A 07/16/2016  ? Procedure: CARDIOVERSION;  Surgeon: Thayer Headings, MD;  Location: [place];  Service: Cardiovascular;  Laterality: N/A;  ? CARDIOVERSION N/A 09/23/2016  ? Procedure: CARDIOVERSION;  Surgeon: Sanda Klein, MD;  Location: [place];  Service: Cardiovascular;  Laterality: N/A;  ? CARDIOVERSION N/A 06/03/2020  ? Procedure: CARDIOVERSION;  Surgeon: Donato Heinz, MD;  Location: [place];  Service: Cardiovascular;  Laterality: N/A;  ? CHOLECYSTECTOMY OPEN    ? COLONOSCOPY N/A 11/17/2017  ? Procedure: COLONOSCOPY;  Surgeon: Ronnette Juniper, MD;  Location: [facility];  Service: Gastroenterology;  Laterality: N/A;  ? cortisone injection to right knee    ? costisone injection to left knee  11/03/2017  ? CYSTOSCOPY WITH RETROGRADE PYELOGRAM, URETEROSCOPY AND STENT PLACEMENT Left 01/14/2019  ? Procedure: CYSTOSCOPy  STENT PLACEMENT;  Surgeon: Robley Fries, MD;  Location: [facility];  Service: Urology;  Laterality: Left;  ? CYSTOSCOPY WITH RETROGRADE PYELOGRAM, URETEROSCOPY AND STENT PLACEMENT Left 03/06/2019  ? Procedure: CYSTOSCOPY WITH RETROGRADE PYELOGRAM, URETEROSCOPY AND STENT PLACEMENT;  Surgeon: Robley Fries, MD;  [facility]: [facility];  Service: Urology;  Laterality: Left;  90 MINS  ? CYSTOSCOPY WITH STENT PLACEMENT Left 03/07/2019  ? Procedure: CYSTOSCOPY WITH , URETEROSCOPY/ STENT PLACEMENT;  Surgeon: Ardis Hughs, MD;  [facility];  Service: Urology;  Laterality: Left;  ? DILATION AND CURETTAGE OF UTERUS    ? S/P miscarriage  ? ESOPHAGOGASTRODUODENOSCOPY N/A 11/17/2017  ? Procedure:  ESOPHAGOGASTRODUODENOSCOPY (EGD);  Surgeon: Ronnette Juniper, MD;  Location: [facility];  Service: Gastroenterology;  Laterality: N/A;  ? HOLMIUM LASER APPLICATION Left 35/00/9381  ? Procedure: HOLMIUM LASER APPLICATION;  Surgeon: Robley Fries, MD;  Location: [facility];  Service: Urology;  Laterality: Left;  ? implantable loop recorder placement  10/20/2020  ? Medtronic Reveal Linq model LNQ 11 (SN [id number]) implantable loop recorder implanted for Alleviate HF trial  ? NASAL SEPTUM SURGERY    ? POLYPECTOMY  11/17/2017  ? Procedure: POLYPECTOMY;  Surgeon: Ronnette Juniper, MD;  Location: [facility];  Service: Gastroenterology;;  ? TONSILLECTOMY    ?  ? ?Current Meds  ?Medication Sig  ? acetaminophen (TYLENOL) 650 MG CR tablet Take 650 mg by mouth 2 (two) times daily as needed for pain.  ? alendronate (FOSAMAX) 70 MG tablet Take 70 mg by mouth every Sunday. Take with a full glass of water on an empty stomach.  ? BESIVANCE 0.6 % SUSP Place 1 drop into the  right eye 3 (three) times daily.  ? betamethasone dipropionate 0.05 % cream Apply 1 application topically 2 (two) times daily.  ? Biotin w/ Vitamins C & E (HAIR SKIN & NAILS GUMMIES PO) Take 2 tablets by mouth daily.   ? blood glucose meter kit and supplies Dispense based on patient and insurance preference. Use up to four times daily as directed. (FOR ICD-10 E10.9, E11.9).  ? Calcium-Vitamin D-Vitamin K (CALCIUM + D + K PO) Take 1 tablet by mouth in the morning.  ? carvedilol (COREG) 3.125 MG tablet TAKE 1 TABLET TWICE DAILY  WITH MEALS  ? Difluprednate 0.05 % EMUL Place 1 drop into the right eye 4 (four) times daily.  ? diphenhydrAMINE-zinc acetate (BENADRYL) cream Apply 1 application topically 3 (three) times daily as needed for itching.  ? dofetilide (TIKOSYN) 500 MCG capsule Take 1 capsule (500 mcg total) by mouth 2 (two) times daily.  ? fluocinolone (SYNALAR) 0.01 % external solution Apply 1 application topically 2 (two) times daily as needed (skin irritation).  ?  folic acid (FOLVITE) 964 MCG tablet Take 800 mcg by mouth daily.  ? furosemide (LASIX) 20 MG tablet TAKE 2 TABLET (40 MG) EVERY OTHER DAY (Patient taking differently: 20 mg daily.)  ? furosemide (LASIX) 2

## 2021-05-08 ENCOUNTER — Telehealth (INDEPENDENT_AMBULATORY_CARE_PROVIDER_SITE_OTHER): Payer: Medicare Other | Admitting: Cardiology

## 2021-05-08 ENCOUNTER — Encounter: Payer: Self-pay | Admitting: Cardiology

## 2021-05-08 VITALS — BP 120/78 | HR 65 | Ht 61.0 in | Wt 283.0 lb

## 2021-05-08 DIAGNOSIS — I3139 Other pericardial effusion (noninflammatory): Secondary | ICD-10-CM

## 2021-05-08 DIAGNOSIS — I5032 Chronic diastolic (congestive) heart failure: Secondary | ICD-10-CM

## 2021-05-08 DIAGNOSIS — R0789 Other chest pain: Secondary | ICD-10-CM

## 2021-05-08 DIAGNOSIS — I4819 Other persistent atrial fibrillation: Secondary | ICD-10-CM

## 2021-05-08 DIAGNOSIS — I10 Essential (primary) hypertension: Secondary | ICD-10-CM

## 2021-05-08 MED ORDER — FUROSEMIDE 20 MG PO TABS: 20.0000 mg | ORAL_TABLET | Freq: Every day | ORAL | 1 refills | Status: DC

## 2021-05-08 NOTE — Patient Instructions (Signed)

## 2021-05-08 NOTE — Addendum Note (Signed)
Addended by: Antonieta Iba on: 05/08/2021 09:58 AM ? ? Modules accepted: Orders ? ?

## 2021-05-19 ENCOUNTER — Ambulatory Visit (HOSPITAL_COMMUNITY)
Admission: RE | Admit: 2021-05-19 | Discharge: 2021-05-19 | Disposition: A | Discharge status: Home / Self Care | Payer: Medicare Other | Source: Ambulatory Visit | Attending: Physician Assistant | Admitting: Physician Assistant

## 2021-05-19 ENCOUNTER — Encounter (HOSPITAL_COMMUNITY): Payer: Self-pay | Admitting: Physician Assistant

## 2021-05-19 VITALS — BP 126/76 | HR 60 | Ht 61.0 in | Wt 288.0 lb

## 2021-05-19 DIAGNOSIS — Z79899 Other long term (current) drug therapy: Secondary | ICD-10-CM | POA: Insufficient documentation

## 2021-05-19 DIAGNOSIS — I5032 Chronic diastolic (congestive) heart failure: Secondary | ICD-10-CM | POA: Insufficient documentation

## 2021-05-19 DIAGNOSIS — I4819 Other persistent atrial fibrillation: Secondary | ICD-10-CM | POA: Insufficient documentation

## 2021-05-19 DIAGNOSIS — E119 Type 2 diabetes mellitus without complications: Secondary | ICD-10-CM | POA: Insufficient documentation

## 2021-05-19 DIAGNOSIS — G4733 Obstructive sleep apnea (adult) (pediatric): Secondary | ICD-10-CM | POA: Insufficient documentation

## 2021-05-19 DIAGNOSIS — I130 Hypertensive heart and chronic kidney disease with heart failure and stage 1 through stage 4 chronic kidney disease, or unspecified chronic kidney disease: Secondary | ICD-10-CM | POA: Diagnosis not present

## 2021-05-19 DIAGNOSIS — Z6841 Body Mass Index (BMI) 40.0 and over, adult: Secondary | ICD-10-CM | POA: Diagnosis not present

## 2021-05-19 DIAGNOSIS — D6869 Other thrombophilia: Secondary | ICD-10-CM | POA: Diagnosis not present

## 2021-05-19 DIAGNOSIS — Z7901 Long term (current) use of anticoagulants: Secondary | ICD-10-CM | POA: Insufficient documentation

## 2021-05-19 LAB — MAGNESIUM: Magnesium: 2.2 mg/dL (ref 1.7–2.4)

## 2021-05-19 LAB — BASIC METABOLIC PANEL
Anion gap: 9 (ref 5–15)
BUN: 12 mg/dL (ref 8–23)
CO2: 24 mmol/L (ref 22–32)
Calcium: 9 mg/dL (ref 8.9–10.3)
Chloride: 106 mmol/L (ref 98–111)
Creatinine, Ser: 0.82 mg/dL (ref 0.44–1.00)
GFR, Estimated: >60 mL/min (ref >60)
Glucose, Bld: 143 mg/dL — ABNORMAL HIGH (ref 70–99)
Potassium: 5.2 mmol/L — ABNORMAL HIGH (ref 3.5–5.1)
Sodium: 139 mmol/L (ref 135–145)

## 2021-05-19 MED ORDER — DOFETILIDE 500 MCG PO CAPS: 500.0000 ug | ORAL_CAPSULE | Freq: Two times a day (BID) | ORAL | 4 refills | Status: DC

## 2021-05-19 NOTE — Progress Notes (Signed)
? ? ?Primary Care Physician: Kathyrn Lass, MD ?Primary Cardiologist: Dr Radford Pax ?Primary Electrophysiologist: Dr Rayann Heman  ?Referring Physician: Dr Radford Pax ? ? ?Karen Rasmussen is a 76 y.o. female with a history of HTN, DM, chronic diastolic CHF, atrial fibrillation who presents for follow up in the [facility]. Seen previously by [name] in 2018. She has been maintained on sotalol since 2018. Patient is on Xarelto for a CHADS2VASC score of 5. She was seen by Dr Radford Pax on 05/13/20 and was found to be back in afib. She admits she had stayed up late for a few nights and had not used her CPAP. She has noticed increased fatigue since then. Patient is s/p DCCV 06/03/20. She had an echocardiogram on 07/18/20 and was noted to be back in afib. Patient is s/p dofetilide admission 7/19-7/22/22. She converted with the medication and did not require DCCV.  ? ?On follow up today, patient reports that she has done well since her last visit. Her ILR shows 0% afib burden. She denies any bleeding issues on anticoagulation.  ? ?Today, she denies symptoms of palpitations, chest pain, shortness of breath, orthopnea, PND, lower extremity edema, dizziness, presyncope, syncope, bleeding, or neurologic sequela. The patient is tolerating medications without difficulties and is otherwise without complaint today.  ? ? ?Atrial Fibrillation Risk Factors: ? ?she does have symptoms or diagnosis of sleep apnea. ?she is compliant with CPAP therapy. ?she does not have a history of rheumatic fever. ? ? ?she has a BMI of Body mass index is 54.42 kg/m?Marland KitchenMarland Kitchen ?Filed Weights  ? 05/19/21 1416  ?Weight: 130.6 kg  ? ? ? ? ?Family History  ?Problem Relation Age of Onset  ? Cancer Father   ? Heart failure Father   ? Diabetes Father   ? CAD Mother   ? Diabetes Brother   ? Hypertension Brother   ? Breast cancer Neg Hx   ? ? ? ?Atrial Fibrillation Management history: ? ?Previous antiarrhythmic drugs: sotalol, dofetilide  ?Previous  cardioversions: 2018 x 3, 06/03/20 ?Previous ablations: none ?CHADS2VASC score: 5 ?Anticoagulation history: Xarelto  ? ? ?Past Medical History:  ?Diagnosis Date  ? Abdominal pain, left lower quadrant   ? Asthmatic bronchitis   ? Benign essential HTN 04/26/2016  ? Bursitis of hip   ? Chronic diastolic CHF (congestive heart failure) ([place])   ? DCM (dilated cardiomyopathy) ([place])   ? EF 50-55% in Jan 2023  ? GERD (gastroesophageal reflux disease)   ? High cholesterol   ? History of kidney stones   ? Hypersomnia   ? Morbid obesity with BMI of 50.0-59.9, adult ([place])   ? OSA on CPAP   ? Osteoarthritis   ? "right hip; both knees" (09/21/2016)  ? Persistent atrial fibrillation ([place]) 04/26/2016  ? Type II diabetes mellitus ([place])   ? Vitamin D deficiency disease   ? ?Past Surgical History:  ?Procedure Laterality Date  ? ABDOMINAL HYSTERECTOMY    ? APPENDECTOMY    ? BALLOON DILATION N/A 11/17/2017  ? Procedure: BALLOON DILATION;  Surgeon: Ronnette Juniper, MD;  Location: [facility];  Service: Gastroenterology;  Laterality: N/A;  ? BIOPSY  11/17/2017  ? Procedure: BIOPSY;  Surgeon: Ronnette Juniper, MD;  Location: [facility];  Service: Gastroenterology;;  ? CARDIAC CATHETERIZATION    ? CARDIOVERSION N/A 06/11/2016  ? Procedure: CARDIOVERSION;  Surgeon: Dorothy Spark, MD;  Location: [place];  Service: Cardiovascular;  Laterality: N/A;  ? CARDIOVERSION N/A 07/16/2016  ? Procedure: CARDIOVERSION;  Surgeon: Acie Fredrickson,  Wonda Cheng, MD;  Location: [place];  Service: Cardiovascular;  Laterality: N/A;  ? CARDIOVERSION N/A 09/23/2016  ? Procedure: CARDIOVERSION;  Surgeon: Sanda Klein, MD;  Location: [place];  Service: Cardiovascular;  Laterality: N/A;  ? CARDIOVERSION N/A 06/03/2020  ? Procedure: CARDIOVERSION;  Surgeon: Donato Heinz, MD;  Location: [place];  Service: Cardiovascular;  Laterality: N/A;  ? CHOLECYSTECTOMY OPEN    ? COLONOSCOPY N/A 11/17/2017  ? Procedure: COLONOSCOPY;  Surgeon: Ronnette Juniper, MD;   Location: [facility];  Service: Gastroenterology;  Laterality: N/A;  ? cortisone injection to right knee    ? costisone injection to left knee  11/03/2017  ? CYSTOSCOPY WITH RETROGRADE PYELOGRAM, URETEROSCOPY AND STENT PLACEMENT Left 01/14/2019  ? Procedure: CYSTOSCOPy  STENT PLACEMENT;  Surgeon: Robley Fries, MD;  [facility];  Service: Urology;  Laterality: Left;  ? CYSTOSCOPY WITH RETROGRADE PYELOGRAM, URETEROSCOPY AND STENT PLACEMENT Left 03/06/2019  ? Procedure: CYSTOSCOPY WITH RETROGRADE PYELOGRAM, URETEROSCOPY AND STENT PLACEMENT;  Surgeon: Robley Fries, MD;  [facility];  Service: Urology;  Laterality: Left;  90 MINS  ? CYSTOSCOPY WITH STENT PLACEMENT Left 03/07/2019  ? Procedure: CYSTOSCOPY WITH , URETEROSCOPY/ STENT PLACEMENT;  Surgeon: Ardis Hughs, MD;  [facility];  Service: Urology;  Laterality: Left;  ? DILATION AND CURETTAGE OF UTERUS    ? S/P miscarriage  ? ESOPHAGOGASTRODUODENOSCOPY N/A 11/17/2017  ? Procedure: ESOPHAGOGASTRODUODENOSCOPY (EGD);  Surgeon: Ronnette Juniper, MD;  Location: [facility];  Service: Gastroenterology;  Laterality: N/A;  ? HOLMIUM LASER APPLICATION Left 36/14/4315  ? Procedure: HOLMIUM LASER APPLICATION;  Surgeon: Robley Fries, MD;  [facility];  Service: Urology;  Laterality: Left;  ? implantable loop recorder placement  10/20/2020  ? Medtronic Reveal Linq model LNQ 11 (SN [id number]) implantable loop recorder implanted for Alleviate HF trial  ? NASAL SEPTUM SURGERY    ? POLYPECTOMY  11/17/2017  ? Procedure: POLYPECTOMY;  Surgeon: Ronnette Juniper, MD;  Location: [facility];  Service: Gastroenterology;;  ? TONSILLECTOMY    ? ? ?Current Outpatient Medications  ?Medication Sig Dispense Refill  ? acetaminophen (TYLENOL) 650 MG CR tablet Take 650 mg by mouth 2 (two) times daily as needed for pain.    ? alendronate (FOSAMAX) 70 MG tablet Take 70 mg by mouth every Sunday. Take with a full glass of water on an empty stomach.    ? betamethasone  dipropionate 0.05 % cream Apply 1 application topically 2 (two) times daily.    ? Biotin w/ Vitamins C & E (HAIR SKIN & NAILS GUMMIES PO) Take 2 tablets by mouth daily.     ? blood glucose meter kit and supplies Dispense based on patient and insurance preference. Use up to four times daily as directed. (FOR ICD-10 E10.9, E11.9). 1 each 0  ? Calcium-Vitamin D-Vitamin K (CALCIUM + D + K PO) Take 1 tablet by mouth in the morning.    ? carvedilol (COREG) 3.125 MG tablet TAKE 1 TABLET TWICE DAILY  WITH MEALS 180 tablet 3  ? Difluprednate 0.05 % EMUL Place 1 drop into the right eye 4 (four) times daily.    ? fluocinolone (SYNALAR) 0.01 % external solution Apply 1 application topically 2 (two) times daily as needed (skin irritation).    ? folic acid (FOLVITE) 400 MCG tablet Take 800 mcg by mouth daily.    ? furosemide (LASIX) 20 MG tablet As needed patient may take 40 MG Lasix PRN by mouth QD x 4 days as directed per  Alleviate Research HF Study PRN plan. 90 tablet 3  ? furosemide (LASIX) 20 MG tablet Take 1 tablet (20 mg total) by mouth daily. 90 tablet 1  ? gabapentin (NEURONTIN) 300 MG capsule Take 300 mg by mouth at bedtime.    ? HYDROcodone bit-homatropine (HYCODAN) 5-1.5 MG/5ML syrup Take 5 mLs by mouth daily.    ? HYDROcodone-acetaminophen (NORCO) 5-325 MG tablet Take 1 tablet by mouth every 4 (four) hours as needed for moderate pain (kidney stone pain). 20 tablet 0  ? Insulin Glargine (BASAGLAR KWIKPEN [place]) Inject 34 Units into the skin in the morning.    ? Insulin Pen Needle 31G X 5 MM MISC 12 Units by Does not apply route 3 (three) times daily. 100 each 0  ? Ipratropium-Albuterol (COMBIVENT RESPIMAT) 20-100 MCG/ACT AERS respimat Inhale 1 puff into the lungs every 6 (six) hours as needed for wheezing or shortness of breath. Use 3 times daily x 5 days, then every 6 hours as needed. 4 g 1  ? metFORMIN (GLUCOPHAGE) 500 MG tablet Take 500 mg by mouth 2 (two) times daily with a meal.    ? Misc Natural Products  (GLUCOSAMINE CHONDROITIN TRIPLE) TABS Take 2 tablets by mouth daily.     ? Multiple Vitamins-Minerals (CENTRUM SILVER) CHEW Chew 2 tablets by mouth daily. soft chews    ? pantoprazole (PROTONIX) 40 MG tablet Take 40 mg by m

## 2021-05-22 ENCOUNTER — Other Ambulatory Visit (HOSPITAL_COMMUNITY): Payer: Self-pay | Admitting: *Deleted

## 2021-05-22 MED ORDER — DOFETILIDE 500 MCG PO CAPS: 500.0000 ug | ORAL_CAPSULE | Freq: Two times a day (BID) | ORAL | 2 refills | Status: DC

## 2021-05-28 ENCOUNTER — Telehealth: Payer: Self-pay | Admitting: Cardiology

## 2021-05-28 NOTE — Telephone Encounter (Signed)
Spoke with the patient and answered her questions about potassium.  ?She is also wondering about salt substitutes which I have educated her on.  ?Patient verbalized understanding.  ?

## 2021-05-28 NOTE — Telephone Encounter (Signed)
Pt c/o medication issue: ? ?1. Name of Medication:  ? potassium chloride (KLOR-CON) 10 MEQ tablet  ? ? ?2. How are you currently taking this medication (dosage and times per day)? TAKE 2 TABLETS BY MOUTH TWICE DAILY ? ?3. Are you having a reaction (difficulty breathing--STAT)? No ? ?4. What is your medication issue? Pt states that she wants to know if she should be on a " No Salt" diet while on this medication. Please advise ? ?

## 2021-06-11 ENCOUNTER — Other Ambulatory Visit: Payer: Self-pay | Admitting: Cardiology

## 2021-06-23 DIAGNOSIS — Z006 Encounter for examination for normal comparison and control in clinical research program: Secondary | ICD-10-CM

## 2021-06-23 NOTE — Research (Addendum)
Alleviate 8 Month f/u  No adverse events to report, Lasix was changed to 20 MG QD on 05-08-2021 according to emr.   Current Outpatient Medications:    acetaminophen (TYLENOL) 650 MG CR tablet, Take 650 mg by mouth 2 (two) times daily as needed for pain., Disp: , Rfl:    alendronate (FOSAMAX) 70 MG tablet, Take 70 mg by mouth every Sunday. Take with a full glass of water on an empty stomach., Disp: , Rfl:    betamethasone dipropionate 0.05 % cream, Apply 1 application topically 2 (two) times daily., Disp: , Rfl:    Biotin w/ Vitamins C & E (HAIR SKIN & NAILS GUMMIES PO), Take 2 tablets by mouth daily. , Disp: , Rfl:    blood glucose meter kit and supplies, Dispense based on patient and insurance preference. Use up to four times daily as directed. (FOR ICD-10 E10.9, E11.9)., Disp: 1 each, Rfl: 0   Calcium-Vitamin D-Vitamin K (CALCIUM + D + K PO), Take 1 tablet by mouth in the morning., Disp: , Rfl:    carvedilol (COREG) 3.125 MG tablet, TAKE 1 TABLET TWICE DAILY  WITH MEALS, Disp: 180 tablet, Rfl: 3   Difluprednate 0.05 % EMUL, Place 1 drop into the right eye 4 (four) times daily., Disp: , Rfl:    dofetilide (TIKOSYN) 500 MCG capsule, Take 1 capsule (500 mcg total) by mouth 2 (two) times daily., Disp: 180 capsule, Rfl: 2   fluocinolone (SYNALAR) 0.01 % external solution, Apply 1 application topically 2 (two) times daily as needed (skin irritation)., Disp: , Rfl:    folic acid (FOLVITE) 629 MCG tablet, Take 800 mcg by mouth daily., Disp: , Rfl:    furosemide (LASIX) 20 MG tablet, As needed patient may take 40 MG Lasix PRN by mouth QD x 4 days as directed per Alleviate Research HF Study PRN plan., Disp: 90 tablet, Rfl: 3   furosemide (LASIX) 20 MG tablet, Take 1 tablet (20 mg total) by mouth daily., Disp: 90 tablet, Rfl: 1   gabapentin (NEURONTIN) 300 MG capsule, Take 300 mg by mouth at bedtime., Disp: , Rfl:    HYDROcodone bit-homatropine (HYCODAN) 5-1.5 MG/5ML syrup, Take 5 mLs by mouth daily.,  Disp: , Rfl:    HYDROcodone-acetaminophen (NORCO) 5-325 MG tablet, Take 1 tablet by mouth every 4 (four) hours as needed for moderate pain (kidney stone pain)., Disp: 20 tablet, Rfl: 0   Insulin Glargine (BASAGLAR KWIKPEN [place]), Inject 34 Units into the skin in the morning., Disp: , Rfl:    Insulin Pen Needle 31G X 5 MM MISC, 12 Units by Does not apply route 3 (three) times daily., Disp: 100 each, Rfl: 0   Ipratropium-Albuterol (COMBIVENT RESPIMAT) 20-100 MCG/ACT AERS respimat, Inhale 1 puff into the lungs every 6 (six) hours as needed for wheezing or shortness of breath. Use 3 times daily x 5 days, then every 6 hours as needed., Disp: 4 g, Rfl: 1   metFORMIN (GLUCOPHAGE) 500 MG tablet, Take 500 mg by mouth 2 (two) times daily with a meal., Disp: , Rfl:    Misc Natural Products (GLUCOSAMINE CHONDROITIN TRIPLE) TABS, Take 2 tablets by mouth daily. , Disp: , Rfl:    Multiple Vitamins-Minerals (CENTRUM SILVER) CHEW, Chew 2 tablets by mouth daily. soft chews, Disp: , Rfl:    pantoprazole (PROTONIX) 40 MG tablet, Take 40 mg by mouth 2 (two) times daily., Disp: , Rfl:    Polyethyl Glycol-Propyl Glycol (SYSTANE) 0.4-0.3 % SOLN, Place 1-2 drops into both eyes 3 (  three) times daily as needed (dry/irritated eyes)., Disp: , Rfl:    potassium chloride (KLOR-CON) 10 MEQ tablet, TAKE 2 TABLETS BY MOUTH TWICE DAILY, Disp: 360 tablet, Rfl: 3   psyllium (METAMUCIL SMOOTH TEXTURE) 28 % packet, Take 1 packet by mouth in the morning., Disp: , Rfl:    Semaglutide,0.25 or 0.5MG/DOS, (OZEMPIC, 0.25 OR 0.5 MG/DOSE,) 2 MG/3ML SOPN, 0.31m weekly x 4 weeks, then 0.537mweekly, Disp: , Rfl:    simvastatin (ZOCOR) 10 MG tablet, Take 1 tablet (10 mg total) by mouth at bedtime., Disp: 90 tablet, Rfl: 1   spironolactone (ALDACTONE) 25 MG tablet, TAKE 1/2 TABLET (12.5MG    TOTAL) DAILY, Disp: 45 tablet, Rfl: 2   triamcinolone cream (KENALOG) 0.1 %, Apply 1 application topically 2 (two) times daily as needed (itching legs). , Disp: ,  Rfl:    XARELTO 20 MG TABS tablet, TAKE 1 TABLET BY MOUTH EVERY DAY, Disp: 90 tablet, Rfl: 1

## 2021-06-26 ENCOUNTER — Telehealth (HOSPITAL_COMMUNITY): Payer: Self-pay

## 2021-06-26 NOTE — Telephone Encounter (Signed)
Patient called regarding her Medtronic device. She is concerned because she was called from the device clinic about her device flagging that she may be retaining some extra fluid. She wanted to know what she needed to do. Advised patient to contact the device clinic back and have them correspond with Dr. Jackalyn [facility] about how much Lasix she should be taking per the research study she is currently enrolled in. Consulted with patient and she verbalized understanding.

## 2021-06-27 ENCOUNTER — Other Ambulatory Visit: Payer: Self-pay | Admitting: Cardiology

## 2021-06-27 DIAGNOSIS — I4819 Other persistent atrial fibrillation: Secondary | ICD-10-CM

## 2021-06-29 NOTE — Telephone Encounter (Signed)
Prescription refill request for Xarelto received.  Indication:afib Last office visit:05/19/21 Weight:130.6 Age:76 Scr:0.82 CrCl:125

## 2021-07-08 ENCOUNTER — Telehealth: Payer: Self-pay | Admitting: Internal Medicine

## 2021-07-08 NOTE — Telephone Encounter (Signed)
Pt c/o medication issue:  1. Name of Medication: Furosemide  2. How are you currently taking this medication (dosage and times per day)?   3. Are you having a reaction (difficulty breathing--STAT)?   4. What is your medication issue? Wants to know since 4 days of 60 mg, should she go back to 40 a day or 40 every other day  or 20 a day

## 2021-07-09 NOTE — Telephone Encounter (Signed)
Returned call to Pt.  Pt is wondering if it is ok to take extra lasix if she feels she needs it.    Per her med list, she has a prescription for as needed lasix that is managed by Alleviate Research HF Study PRN plan.  Asked Pt if she had a number to reach out to to discuss her lasix prescription with the research team?  She states she does.  Recommended Pt reach out to them to discuss what she should do regarding extra lasix.  She indicates understanding and will call.

## 2021-07-10 DIAGNOSIS — Z006 Encounter for examination for normal comparison and control in clinical research program: Secondary | ICD-10-CM

## 2021-07-10 NOTE — Research (Addendum)
Alleviate HF Research Study Intervention Plan  Medications reviewed and action taken:  -'[]'$  Medication changes made                       -'[x]'$  No medication change                              -'[]'$  PRN medication intervention changes

## 2021-07-17 NOTE — Progress Notes (Signed)
No medicine changes

## 2021-08-03 ENCOUNTER — Other Ambulatory Visit: Payer: Self-pay | Admitting: Cardiology

## 2021-08-12 DIAGNOSIS — Z006 Encounter for examination for normal comparison and control in clinical research program: Secondary | ICD-10-CM

## 2021-08-12 NOTE — Research (Cosign Needed Addendum)
Alleviate HF Research Study Investigator Action  Medications reviewed and action taken:   -'[x]'$  Medication changes made                       -'[]'$  No medication change     **If no medication changes , specify rationale**                       -'[]'$  PRN medication intervention changes            Addendum: Given several recent index elevations and patient symptoms (with phone notes suggesting that she wishes to take additional lasix from time to time), I would advise that we increase her daily lasix to '40mg'$  daily.  Recent bmet (4/23) reviewed.  I do not feel that we need to increase KDur presently.  Plan: Increase lasix to '40mg'$  daily Repeat bmet and mg in 1 week Avoid salt (2g restriction) Adequate hydration and heat avoidance encouraged AF clinic follow-up as scheduled 10/23 I will route note to Dr Radford Pax as Juluis Rainier.  Thompson Grayer MD, [place] 08/14/2021 9:36 AM

## 2021-08-14 ENCOUNTER — Other Ambulatory Visit: Payer: Self-pay

## 2021-08-14 DIAGNOSIS — I4819 Other persistent atrial fibrillation: Secondary | ICD-10-CM

## 2021-08-14 DIAGNOSIS — I5032 Chronic diastolic (congestive) heart failure: Secondary | ICD-10-CM

## 2021-08-14 MED ORDER — FUROSEMIDE 20 MG PO TABS: 40.0000 mg | ORAL_TABLET | Freq: Every day | ORAL | 0 refills | Status: DC

## 2021-08-14 NOTE — Addendum Note (Signed)
Addended by: Rubie Maid B on: 08/14/2021 09:41 AM   Modules accepted: Orders

## 2021-08-21 ENCOUNTER — Other Ambulatory Visit: Payer: Medicare Other

## 2021-08-21 DIAGNOSIS — I5032 Chronic diastolic (congestive) heart failure: Secondary | ICD-10-CM | POA: Diagnosis not present

## 2021-08-21 DIAGNOSIS — I4819 Other persistent atrial fibrillation: Secondary | ICD-10-CM

## 2021-08-22 LAB — BASIC METABOLIC PANEL
BUN/Creatinine Ratio: 13 (ref 12–28)
BUN: 12 mg/dL (ref 8–27)
CO2: 25 mmol/L (ref 20–29)
Calcium: 9.4 mg/dL (ref 8.7–10.3)
Chloride: 100 mmol/L (ref 96–106)
Creatinine, Ser: 0.91 mg/dL (ref 0.57–1.00)
Glucose: 142 mg/dL — ABNORMAL HIGH (ref 70–99)
Potassium: 4.2 mmol/L (ref 3.5–5.2)
Sodium: 141 mmol/L (ref 134–144)
eGFR: 67 mL/min/{1.73_m2} (ref >59)

## 2021-08-22 LAB — MAGNESIUM: Magnesium: 2.2 mg/dL (ref 1.6–2.3)

## 2021-08-25 DIAGNOSIS — Z006 Encounter for examination for normal comparison and control in clinical research program: Secondary | ICD-10-CM

## 2021-08-25 NOTE — Research (Signed)
Alleviate HF Research Study  10 Month Follow-up  No adverse events or medication changes to report at this time.   Current Outpatient Medications:    acetaminophen (TYLENOL) 650 MG CR tablet, Take 650 mg by mouth 2 (two) times daily as needed for pain., Disp: , Rfl:    alendronate (FOSAMAX) 70 MG tablet, Take 70 mg by mouth every Sunday. Take with a full glass of water on an empty stomach., Disp: , Rfl:    betamethasone dipropionate 0.05 % cream, Apply 1 application topically 2 (two) times daily., Disp: , Rfl:    Biotin w/ Vitamins C & E (HAIR SKIN & NAILS GUMMIES PO), Take 2 tablets by mouth daily. , Disp: , Rfl:    blood glucose meter kit and supplies, Dispense based on patient and insurance preference. Use up to four times daily as directed. (FOR ICD-10 E10.9, E11.9)., Disp: 1 each, Rfl: 0   Calcium-Vitamin D-Vitamin K (CALCIUM + D + K PO), Take 1 tablet by mouth in the morning., Disp: , Rfl:    carvedilol (COREG) 3.125 MG tablet, TAKE 1 TABLET TWICE DAILY  WITH MEALS, Disp: 180 tablet, Rfl: 3   Difluprednate 0.05 % EMUL, Place 1 drop into the right eye 4 (four) times daily., Disp: , Rfl:    dofetilide (TIKOSYN) 500 MCG capsule, Take 1 capsule (500 mcg total) by mouth 2 (two) times daily., Disp: 180 capsule, Rfl: 2   fluocinolone (SYNALAR) 0.01 % external solution, Apply 1 application topically 2 (two) times daily as needed (skin irritation)., Disp: , Rfl:    folic acid (FOLVITE) 334 MCG tablet, Take 800 mcg by mouth daily., Disp: , Rfl:    furosemide (LASIX) 20 MG tablet, As needed patient may take 40 MG Lasix PRN by mouth QD x 4 days as directed per Alleviate Research HF Study PRN plan., Disp: 90 tablet, Rfl: 3   furosemide (LASIX) 20 MG tablet, Take 2 tablets (40 mg total) by mouth daily., Disp: 180 tablet, Rfl: 0   gabapentin (NEURONTIN) 300 MG capsule, Take 300 mg by mouth at bedtime., Disp: , Rfl:    HYDROcodone bit-homatropine (HYCODAN) 5-1.5 MG/5ML syrup, Take 5 mLs by mouth daily.,  Disp: , Rfl:    HYDROcodone-acetaminophen (NORCO) 5-325 MG tablet, Take 1 tablet by mouth every 4 (four) hours as needed for moderate pain (kidney stone pain)., Disp: 20 tablet, Rfl: 0   Insulin Glargine (BASAGLAR KWIKPEN [place]), Inject 34 Units into the skin in the morning., Disp: , Rfl:    Insulin Pen Needle 31G X 5 MM MISC, 12 Units by Does not apply route 3 (three) times daily., Disp: 100 each, Rfl: 0   Ipratropium-Albuterol (COMBIVENT RESPIMAT) 20-100 MCG/ACT AERS respimat, Inhale 1 puff into the lungs every 6 (six) hours as needed for wheezing or shortness of breath. Use 3 times daily x 5 days, then every 6 hours as needed., Disp: 4 g, Rfl: 1   metFORMIN (GLUCOPHAGE) 500 MG tablet, Take 500 mg by mouth 2 (two) times daily with a meal., Disp: , Rfl:    Misc Natural Products (GLUCOSAMINE CHONDROITIN TRIPLE) TABS, Take 2 tablets by mouth daily. , Disp: , Rfl:    Multiple Vitamins-Minerals (CENTRUM SILVER) CHEW, Chew 2 tablets by mouth daily. soft chews, Disp: , Rfl:    pantoprazole (PROTONIX) 40 MG tablet, Take 40 mg by mouth 2 (two) times daily., Disp: , Rfl:    Polyethyl Glycol-Propyl Glycol (SYSTANE) 0.4-0.3 % SOLN, Place 1-2 drops into both eyes 3 (three) times  daily as needed (dry/irritated eyes)., Disp: , Rfl:    potassium chloride (KLOR-CON) 10 MEQ tablet, TAKE 2 TABLETS BY MOUTH TWICE DAILY, Disp: 360 tablet, Rfl: 3   psyllium (METAMUCIL SMOOTH TEXTURE) 28 % packet, Take 1 packet by mouth in the morning., Disp: , Rfl:    rivaroxaban (XARELTO) 20 MG TABS tablet, TAKE 1 TABLET BY MOUTH EVERY EVENING, Disp: 90 tablet, Rfl: 1   Semaglutide,0.25 or 0.5MG/DOS, (OZEMPIC, 0.25 OR 0.5 MG/DOSE,) 2 MG/3ML SOPN, 0.4m weekly x 4 weeks, then 0.568mweekly, Disp: , Rfl:    simvastatin (ZOCOR) 10 MG tablet, Take 1 tablet (10 mg total) by mouth at bedtime., Disp: 90 tablet, Rfl: 1   spironolactone (ALDACTONE) 25 MG tablet, TAKE 1/2 TABLET DAILY, Disp: 45 tablet, Rfl: 2   triamcinolone cream (KENALOG) 0.1 %,  Apply 1 application topically 2 (two) times daily as needed (itching legs). , Disp: , Rfl:

## 2021-08-30 ENCOUNTER — Other Ambulatory Visit: Payer: Self-pay | Admitting: Cardiology

## 2021-08-30 ENCOUNTER — Other Ambulatory Visit: Payer: Self-pay | Admitting: Internal Medicine

## 2021-09-02 DIAGNOSIS — Z1331 Encounter for screening for depression: Secondary | ICD-10-CM | POA: Diagnosis not present

## 2021-09-02 DIAGNOSIS — R0602 Shortness of breath: Secondary | ICD-10-CM | POA: Diagnosis not present

## 2021-09-02 DIAGNOSIS — R5383 Other fatigue: Secondary | ICD-10-CM | POA: Diagnosis not present

## 2021-09-02 DIAGNOSIS — Z6841 Body Mass Index (BMI) 40.0 and over, adult: Secondary | ICD-10-CM | POA: Diagnosis not present

## 2021-09-02 DIAGNOSIS — I5030 Unspecified diastolic (congestive) heart failure: Secondary | ICD-10-CM | POA: Diagnosis not present

## 2021-09-02 DIAGNOSIS — I10 Essential (primary) hypertension: Secondary | ICD-10-CM | POA: Diagnosis not present

## 2021-09-02 DIAGNOSIS — K219 Gastro-esophageal reflux disease without esophagitis: Secondary | ICD-10-CM | POA: Diagnosis not present

## 2021-09-02 DIAGNOSIS — I4891 Unspecified atrial fibrillation: Secondary | ICD-10-CM | POA: Diagnosis not present

## 2021-09-02 DIAGNOSIS — E559 Vitamin D deficiency, unspecified: Secondary | ICD-10-CM | POA: Diagnosis not present

## 2021-09-02 DIAGNOSIS — E118 Type 2 diabetes mellitus with unspecified complications: Secondary | ICD-10-CM | POA: Diagnosis not present

## 2021-09-02 DIAGNOSIS — E7800 Pure hypercholesterolemia, unspecified: Secondary | ICD-10-CM | POA: Diagnosis not present

## 2021-09-02 DIAGNOSIS — G4733 Obstructive sleep apnea (adult) (pediatric): Secondary | ICD-10-CM | POA: Diagnosis not present

## 2021-09-08 DIAGNOSIS — E119 Type 2 diabetes mellitus without complications: Secondary | ICD-10-CM | POA: Diagnosis not present

## 2021-10-07 DIAGNOSIS — R197 Diarrhea, unspecified: Secondary | ICD-10-CM | POA: Diagnosis not present

## 2021-10-07 DIAGNOSIS — G4733 Obstructive sleep apnea (adult) (pediatric): Secondary | ICD-10-CM | POA: Diagnosis not present

## 2021-10-07 DIAGNOSIS — I4891 Unspecified atrial fibrillation: Secondary | ICD-10-CM | POA: Diagnosis not present

## 2021-10-07 DIAGNOSIS — I10 Essential (primary) hypertension: Secondary | ICD-10-CM | POA: Diagnosis not present

## 2021-10-07 DIAGNOSIS — Z6841 Body Mass Index (BMI) 40.0 and over, adult: Secondary | ICD-10-CM | POA: Diagnosis not present

## 2021-10-07 DIAGNOSIS — K219 Gastro-esophageal reflux disease without esophagitis: Secondary | ICD-10-CM | POA: Diagnosis not present

## 2021-10-07 DIAGNOSIS — R1032 Left lower quadrant pain: Secondary | ICD-10-CM | POA: Diagnosis not present

## 2021-10-07 DIAGNOSIS — E7800 Pure hypercholesterolemia, unspecified: Secondary | ICD-10-CM | POA: Diagnosis not present

## 2021-10-07 DIAGNOSIS — I509 Heart failure, unspecified: Secondary | ICD-10-CM | POA: Diagnosis not present

## 2021-10-07 DIAGNOSIS — K224 Dyskinesia of esophagus: Secondary | ICD-10-CM | POA: Diagnosis not present

## 2021-10-07 DIAGNOSIS — E1169 Type 2 diabetes mellitus with other specified complication: Secondary | ICD-10-CM | POA: Diagnosis not present

## 2021-10-15 ENCOUNTER — Other Ambulatory Visit: Payer: Self-pay

## 2021-10-15 MED ORDER — CARVEDILOL 3.125 MG PO TABS: 3.1250 mg | ORAL_TABLET | Freq: Two times a day (BID) | ORAL | 1 refills | Status: DC

## 2021-10-15 NOTE — Telephone Encounter (Signed)
Pt's medications were sent to pt's pharmacy as requested. Confirmation received.  

## 2021-10-18 ENCOUNTER — Other Ambulatory Visit: Payer: Self-pay | Admitting: Internal Medicine

## 2021-10-18 DIAGNOSIS — Z006 Encounter for examination for normal comparison and control in clinical research program: Secondary | ICD-10-CM

## 2021-10-19 ENCOUNTER — Other Ambulatory Visit: Payer: Self-pay | Admitting: Internal Medicine

## 2021-10-19 DIAGNOSIS — I4819 Other persistent atrial fibrillation: Secondary | ICD-10-CM

## 2021-10-19 NOTE — Telephone Encounter (Signed)
Prescription refill request for Xarelto received.  Indication:Afib Last office visit:3/23 Weight:130.6 kg Age:76 Scr:0.9 CrCl:114.78 ml/min  Prescription refilled

## 2021-10-23 VITALS — BP 131/77 | HR 71 | Resp 16 | Wt 273.0 lb

## 2021-10-23 DIAGNOSIS — Z006 Encounter for examination for normal comparison and control in clinical research program: Secondary | ICD-10-CM

## 2021-10-23 DIAGNOSIS — I509 Heart failure, unspecified: Secondary | ICD-10-CM

## 2021-10-23 MED ORDER — FUROSEMIDE 20 MG PO TABS: ORAL_TABLET | ORAL | 3 refills | Status: DC

## 2021-10-23 NOTE — Research (Signed)
Alleviate HF Research Study  12 Month Follow-up  Patient doing well at this time, no adverse events to report. NYHA ll Trace edema lower legs. 6 MHWT  Labs drawn.   Current Outpatient Medications:    acetaminophen (TYLENOL) 650 MG CR tablet, Take 650 mg by mouth 2 (two) times daily as needed for pain., Disp: , Rfl:    alendronate (FOSAMAX) 70 MG tablet, Take 70 mg by mouth every Sunday. Take with a full glass of water on an empty stomach., Disp: , Rfl:    betamethasone dipropionate 0.05 % cream, Apply 1 application topically 2 (two) times daily., Disp: , Rfl:    Biotin w/ Vitamins C & E (HAIR SKIN & NAILS GUMMIES PO), Take 2 tablets by mouth daily. , Disp: , Rfl:    blood glucose meter kit and supplies, Dispense based on patient and insurance preference. Use up to four times daily as directed. (FOR ICD-10 E10.9, E11.9)., Disp: 1 each, Rfl: 0   Calcium-Vitamin D-Vitamin K (CALCIUM + D + K PO), Take 1 tablet by mouth in the morning., Disp: , Rfl:    carvedilol (COREG) 3.125 MG tablet, Take 1 tablet (3.125 mg total) by mouth 2 (two) times daily with a meal., Disp: 180 tablet, Rfl: 1   dofetilide (TIKOSYN) 500 MCG capsule, Take 1 capsule (500 mcg total) by mouth 2 (two) times daily., Disp: 180 capsule, Rfl: 2   fluocinolone (SYNALAR) 0.01 % external solution, Apply 1 application topically 2 (two) times daily as needed (skin irritation)., Disp: , Rfl:    folic acid (FOLVITE) 323 MCG tablet, Take 800 mcg by mouth daily., Disp: , Rfl:    furosemide (LASIX) 20 MG tablet, Take 2 tablets (40 mg total) by mouth daily., Disp: 180 tablet, Rfl: 0   furosemide (LASIX) 20 MG tablet, As needed patient may take 40 MG Lasix PRN by mouth daily x 4 days as directed per Alleviate Research HF Study PRN plan., Disp: 90 tablet, Rfl: 3   gabapentin (NEURONTIN) 300 MG capsule, Take 300 mg by mouth at bedtime., Disp: , Rfl:    HYDROcodone-acetaminophen (NORCO) 5-325 MG tablet, Take 1 tablet by mouth every 4 (four)  hours as needed for moderate pain (kidney stone pain)., Disp: 20 tablet, Rfl: 0   Insulin Glargine (BASAGLAR KWIKPEN [place]), Inject 34 Units into the skin in the morning., Disp: , Rfl:    Insulin Pen Needle 31G X 5 MM MISC, 12 Units by Does not apply route 3 (three) times daily., Disp: 100 each, Rfl: 0   metFORMIN (GLUCOPHAGE) 500 MG tablet, Take 500 mg by mouth 2 (two) times daily with a meal., Disp: , Rfl:    Misc Natural Products (GLUCOSAMINE CHONDROITIN TRIPLE) TABS, Take 2 tablets by mouth daily. , Disp: , Rfl:    Multiple Vitamins-Minerals (CENTRUM SILVER) CHEW, Chew 2 tablets by mouth daily. soft chews, Disp: , Rfl:    pantoprazole (PROTONIX) 40 MG tablet, Take 40 mg by mouth 2 (two) times daily., Disp: , Rfl:    Polyethyl Glycol-Propyl Glycol (SYSTANE) 0.4-0.3 % SOLN, Place 1-2 drops into both eyes 3 (three) times daily as needed (dry/irritated eyes)., Disp: , Rfl:    potassium chloride (KLOR-CON M10) 10 MEQ tablet, TAKE 2 TABLETS BY MOUTH TWICE A DAY, Disp: 360 tablet, Rfl: 2   psyllium (METAMUCIL SMOOTH TEXTURE) 28 % packet, Take 1 packet by mouth in the morning., Disp: , Rfl:    Semaglutide,0.25 or 0.5MG/DOS, (OZEMPIC, 0.25 OR 0.5 MG/DOSE,) 2 MG/3ML SOPN, 0.32m  weekly x 4 weeks, then 0.27m weekly, Disp: , Rfl:    simvastatin (ZOCOR) 10 MG tablet, Take 1 tablet (10 mg total) by mouth at bedtime., Disp: 90 tablet, Rfl: 1   spironolactone (ALDACTONE) 25 MG tablet, TAKE 1/2 TABLET DAILY, Disp: 45 tablet, Rfl: 2   triamcinolone cream (KENALOG) 0.1 %, Apply 1 application topically 2 (two) times daily as needed (itching legs). , Disp: , Rfl:    XARELTO 20 MG TABS tablet, TAKE 1 TABLET BY MOUTH EVERY DAY, Disp: 90 tablet, Rfl: 1   Difluprednate 0.05 % EMUL, Place 1 drop into the right eye 4 (four) times daily. (Patient not taking: Reported on 10/23/2021), Disp: , Rfl:    HYDROcodone bit-homatropine (HYCODAN) 5-1.5 MG/5ML syrup, Take 5 mLs by mouth daily. (Patient not taking: Reported on 10/23/2021),  Disp: , Rfl:    Ipratropium-Albuterol (COMBIVENT RESPIMAT) 20-100 MCG/ACT AERS respimat, Inhale 1 puff into the lungs every 6 (six) hours as needed for wheezing or shortness of breath. Use 3 times daily x 5 days, then every 6 hours as needed. (Patient not taking: Reported on 10/23/2021), Disp: 4 g, Rfl: 1

## 2021-10-25 LAB — CBC
Hematocrit: 42.2 % (ref 34.0–46.6)
Hemoglobin: 13.3 g/dL (ref 11.1–15.9)
MCH: 27.1 pg (ref 26.6–33.0)
MCHC: 31.5 g/dL (ref 31.5–35.7)
MCV: 86 fL (ref 79–97)
Platelets: 262 10*3/uL (ref 150–450)
RBC: 4.91 x10E6/uL (ref 3.77–5.28)
RDW: 13.3 % (ref 11.7–15.4)
WBC: 9.1 10*3/uL (ref 3.4–10.8)

## 2021-10-25 LAB — BASIC METABOLIC PANEL
BUN/Creatinine Ratio: 18 (ref 12–28)
BUN: 17 mg/dL (ref 8–27)
CO2: 23 mmol/L (ref 20–29)
Calcium: 9.4 mg/dL (ref 8.7–10.3)
Chloride: 100 mmol/L (ref 96–106)
Creatinine, Ser: 0.94 mg/dL (ref 0.57–1.00)
Glucose: 143 mg/dL — ABNORMAL HIGH (ref 70–99)
Potassium: 4.6 mmol/L (ref 3.5–5.2)
Sodium: 140 mmol/L (ref 134–144)
eGFR: 64 mL/min/{1.73_m2} (ref >59)

## 2021-10-25 LAB — BRAIN NATRIURETIC PEPTIDE: BNP: 17.7 pg/mL (ref 0.0–100.0)

## 2021-10-26 DIAGNOSIS — E1169 Type 2 diabetes mellitus with other specified complication: Secondary | ICD-10-CM | POA: Diagnosis not present

## 2021-10-26 DIAGNOSIS — M810 Age-related osteoporosis without current pathological fracture: Secondary | ICD-10-CM | POA: Diagnosis not present

## 2021-10-26 DIAGNOSIS — M1990 Unspecified osteoarthritis, unspecified site: Secondary | ICD-10-CM | POA: Diagnosis not present

## 2021-10-26 DIAGNOSIS — Z6841 Body Mass Index (BMI) 40.0 and over, adult: Secondary | ICD-10-CM | POA: Diagnosis not present

## 2021-10-26 DIAGNOSIS — Z23 Encounter for immunization: Secondary | ICD-10-CM | POA: Diagnosis not present

## 2021-10-26 NOTE — Progress Notes (Signed)
Please review

## 2021-11-05 DIAGNOSIS — Z6841 Body Mass Index (BMI) 40.0 and over, adult: Secondary | ICD-10-CM | POA: Diagnosis not present

## 2021-11-05 DIAGNOSIS — I509 Heart failure, unspecified: Secondary | ICD-10-CM | POA: Diagnosis not present

## 2021-11-05 DIAGNOSIS — K224 Dyskinesia of esophagus: Secondary | ICD-10-CM | POA: Diagnosis not present

## 2021-11-05 DIAGNOSIS — I4891 Unspecified atrial fibrillation: Secondary | ICD-10-CM | POA: Diagnosis not present

## 2021-11-05 DIAGNOSIS — E1169 Type 2 diabetes mellitus with other specified complication: Secondary | ICD-10-CM | POA: Diagnosis not present

## 2021-11-10 ENCOUNTER — Other Ambulatory Visit: Payer: Self-pay | Admitting: Family Medicine

## 2021-11-10 DIAGNOSIS — Z1389 Encounter for screening for other disorder: Secondary | ICD-10-CM | POA: Diagnosis not present

## 2021-11-10 DIAGNOSIS — Z1231 Encounter for screening mammogram for malignant neoplasm of breast: Secondary | ICD-10-CM

## 2021-11-10 DIAGNOSIS — Z0000 Encounter for general adult medical examination without abnormal findings: Secondary | ICD-10-CM | POA: Diagnosis not present

## 2021-11-10 DIAGNOSIS — Z23 Encounter for immunization: Secondary | ICD-10-CM | POA: Diagnosis not present

## 2021-11-18 ENCOUNTER — Ambulatory Visit (HOSPITAL_COMMUNITY): Payer: Medicare Other | Admitting: Physician Assistant

## 2021-11-19 DIAGNOSIS — Z006 Encounter for examination for normal comparison and control in clinical research program: Secondary | ICD-10-CM

## 2021-11-19 NOTE — Research (Signed)
Medications reviewed and action taken:   -'[]'$  Medication changes made                       -'[]'$  No medication change     **If no medication changes , specify rationale**                       -'[]'$  PRN medication intervention changes

## 2021-12-01 ENCOUNTER — Encounter (HOSPITAL_COMMUNITY): Payer: Self-pay | Admitting: Physician Assistant

## 2021-12-01 ENCOUNTER — Ambulatory Visit (HOSPITAL_COMMUNITY)
Admission: RE | Admit: 2021-12-01 | Discharge: 2021-12-01 | Disposition: A | Discharge status: Home / Self Care | Payer: Medicare Other | Source: Ambulatory Visit | Attending: Physician Assistant | Admitting: Physician Assistant

## 2021-12-01 VITALS — BP 126/76 | HR 73 | Ht 61.0 in | Wt 273.8 lb

## 2021-12-01 DIAGNOSIS — G932 Benign intracranial hypertension: Secondary | ICD-10-CM | POA: Insufficient documentation

## 2021-12-01 DIAGNOSIS — E119 Type 2 diabetes mellitus without complications: Secondary | ICD-10-CM | POA: Diagnosis not present

## 2021-12-01 DIAGNOSIS — D6869 Other thrombophilia: Secondary | ICD-10-CM | POA: Diagnosis not present

## 2021-12-01 DIAGNOSIS — Z6841 Body Mass Index (BMI) 40.0 and over, adult: Secondary | ICD-10-CM | POA: Diagnosis not present

## 2021-12-01 DIAGNOSIS — Z7901 Long term (current) use of anticoagulants: Secondary | ICD-10-CM | POA: Diagnosis not present

## 2021-12-01 DIAGNOSIS — I110 Hypertensive heart disease with heart failure: Secondary | ICD-10-CM | POA: Insufficient documentation

## 2021-12-01 DIAGNOSIS — G4733 Obstructive sleep apnea (adult) (pediatric): Secondary | ICD-10-CM | POA: Diagnosis not present

## 2021-12-01 DIAGNOSIS — I5032 Chronic diastolic (congestive) heart failure: Secondary | ICD-10-CM | POA: Diagnosis not present

## 2021-12-01 DIAGNOSIS — I4819 Other persistent atrial fibrillation: Secondary | ICD-10-CM | POA: Insufficient documentation

## 2021-12-01 LAB — BASIC METABOLIC PANEL
Anion gap: 7 (ref 5–15)
BUN: 8 mg/dL (ref 8–23)
CO2: 26 mmol/L (ref 22–32)
Calcium: 9 mg/dL (ref 8.9–10.3)
Chloride: 106 mmol/L (ref 98–111)
Creatinine, Ser: 0.92 mg/dL (ref 0.44–1.00)
GFR, Estimated: >60 mL/min (ref >60)
Glucose, Bld: 133 mg/dL — ABNORMAL HIGH (ref 70–99)
Potassium: 4 mmol/L (ref 3.5–5.1)
Sodium: 139 mmol/L (ref 135–145)

## 2021-12-01 LAB — MAGNESIUM: Magnesium: 2.2 mg/dL (ref 1.7–2.4)

## 2021-12-01 NOTE — Progress Notes (Signed)
Primary Care Physician: Kathyrn Lass, MD Primary Cardiologist: Dr Radford Pax Primary Electrophysiologist: Dr Rayann Heman  Referring Physician: Dr Genevie Ann is a 76 y.o. female with a history of HTN, DM, chronic diastolic CHF, atrial fibrillation who presents for follow up in the [facility]. Seen previously by [name] in 2018. She has been maintained on sotalol since 2018. Patient is on Xarelto for a CHADS2VASC score of 5. She was seen by Dr Radford Pax on 05/13/20 and was found to be back in afib. She admits she had stayed up late for a few nights and had not used her CPAP. She has noticed increased fatigue since then. Patient is s/p DCCV 06/03/20. She had an echocardiogram on 07/18/20 and was noted to be back in afib. Patient is s/p dofetilide admission 7/19-7/22/22. She converted with the medication and did not require DCCV.   On follow up today, patient reports that she has done well since her last visit. Her last ILR interrogation showed 0% afib burden. No bleeding issues on anticoagulation. She admits she is very sedentary.   Today, she denies symptoms of palpitations, chest pain, shortness of breath, orthopnea, PND, lower extremity edema, dizziness, presyncope, syncope, bleeding, or neurologic sequela. The patient is tolerating medications without difficulties and is otherwise without complaint today.    Atrial Fibrillation Risk Factors:  she does have symptoms or diagnosis of sleep apnea. she is compliant with CPAP therapy. she does not have a history of rheumatic fever.   she has a BMI of Body mass index is 51.73 kg/m.Marland Kitchen Filed Weights   12/01/21 1353  Weight: 124.2 kg    Family History  Problem Relation Age of Onset   Cancer Father    Heart failure Father    Diabetes Father    CAD Mother    Diabetes Brother    Hypertension Brother    Breast cancer Neg Hx      Atrial Fibrillation Management history:  Previous antiarrhythmic drugs:  sotalol, dofetilide  Previous cardioversions: 2018 x 3, 06/03/20 Previous ablations: none CHADS2VASC score: 5 Anticoagulation history: Xarelto    Past Medical History:  Diagnosis Date   Abdominal pain, left lower quadrant    Asthmatic bronchitis    Benign essential HTN 04/26/2016   Bursitis of hip    Chronic diastolic CHF (congestive heart failure) (HCC)    DCM (dilated cardiomyopathy) ([place])    EF 50-55% in Jan 2023   GERD (gastroesophageal reflux disease)    High cholesterol    History of kidney stones    Hypersomnia    Morbid obesity with BMI of 50.0-59.9, adult ([place])    OSA on CPAP    Osteoarthritis    "right hip; both knees" (09/21/2016)   Persistent atrial fibrillation ([place]) 04/26/2016   Type II diabetes mellitus ([place])    Vitamin D deficiency disease    Past Surgical History:  Procedure Laterality Date   ABDOMINAL HYSTERECTOMY     APPENDECTOMY     BALLOON DILATION N/A 11/17/2017   Procedure: BALLOON DILATION;  Surgeon: Ronnette Juniper, MD;  Location: [facility] ENDOSCOPY;  Service: Gastroenterology;  Laterality: N/A;   BIOPSY  11/17/2017   Procedure: BIOPSY;  Surgeon: Ronnette Juniper, MD;  Location: [facility];  Service: Gastroenterology;;   CARDIAC CATHETERIZATION     CARDIOVERSION N/A 06/11/2016   Procedure: CARDIOVERSION;  Surgeon: Dorothy Spark, MD;  Location: [facility] ENDOSCOPY;  Service: Cardiovascular;  Laterality: N/A;   CARDIOVERSION N/A 07/16/2016   Procedure:  CARDIOVERSION;  Surgeon: Thayer Headings, MD;  Location: [facility] ENDOSCOPY;  Service: Cardiovascular;  Laterality: N/A;   CARDIOVERSION N/A 09/23/2016   Procedure: CARDIOVERSION;  Surgeon: Sanda Klein, MD;  Location: [facility] ENDOSCOPY;  Service: Cardiovascular;  Laterality: N/A;   CARDIOVERSION N/A 06/03/2020   Procedure: CARDIOVERSION;  Surgeon: Donato Heinz, MD;  Location: [facility] ENDOSCOPY;  Service: Cardiovascular;  Laterality: N/A;   CHOLECYSTECTOMY OPEN     COLONOSCOPY N/A 11/17/2017   Procedure: COLONOSCOPY;   Surgeon: Ronnette Juniper, MD;  Location: [facility] ENDOSCOPY;  Service: Gastroenterology;  Laterality: N/A;   cortisone injection to right knee     costisone injection to left knee  11/03/2017   CYSTOSCOPY WITH RETROGRADE PYELOGRAM, URETEROSCOPY AND STENT PLACEMENT Left 01/14/2019   Procedure: CYSTOSCOPy  STENT PLACEMENT;  Surgeon: Robley Fries, MD;  Location: [facility] ORS;  Service: Urology;  Laterality: Left;   CYSTOSCOPY WITH RETROGRADE PYELOGRAM, URETEROSCOPY AND STENT PLACEMENT Left 03/06/2019   Procedure: CYSTOSCOPY WITH RETROGRADE PYELOGRAM, URETEROSCOPY AND STENT PLACEMENT;  Surgeon: Robley Fries, MD;  Location: [facility] ORS;  Service: Urology;  Laterality: Left;  90 MINS   CYSTOSCOPY WITH STENT PLACEMENT Left 03/07/2019   Procedure: CYSTOSCOPY WITH , URETEROSCOPY/ STENT PLACEMENT;  Surgeon: Ardis Hughs, MD;  Location: [facility] ORS;  Service: Urology;  Laterality: Left;   DILATION AND CURETTAGE OF UTERUS     S/P miscarriage   ESOPHAGOGASTRODUODENOSCOPY N/A 11/17/2017   Procedure: ESOPHAGOGASTRODUODENOSCOPY (EGD);  Surgeon: Ronnette Juniper, MD;  Location: [facility];  Service: Gastroenterology;  Laterality: N/A;   HOLMIUM LASER APPLICATION Left 40/81/4481   Procedure: HOLMIUM LASER APPLICATION;  Surgeon: Robley Fries, MD;  Location: [facility] ORS;  Service: Urology;  Laterality: Left;   implantable loop recorder placement  10/20/2020   Medtronic Reveal Linq model LNQ 11 (SN [id number]) implantable loop recorder implanted for Alleviate HF trial   NASAL SEPTUM SURGERY     POLYPECTOMY  11/17/2017   Procedure: POLYPECTOMY;  Surgeon: Ronnette Juniper, MD;  Location: [facility] ENDOSCOPY;  Service: Gastroenterology;;   TONSILLECTOMY      Current Outpatient Medications  Medication Sig Dispense Refill   acetaminophen (TYLENOL) 650 MG CR tablet Take 650 mg by mouth 2 (two) times daily as needed for pain.     alendronate (FOSAMAX) 70 MG tablet Take 70 mg by mouth every Sunday. Take with a full glass of water on an empty  stomach.     betamethasone dipropionate 0.05 % cream Apply 1 application topically 2 (two) times daily.     Biotin w/ Vitamins C & E (HAIR SKIN & NAILS GUMMIES PO) Take 2 tablets by mouth daily.      blood glucose meter kit and supplies Dispense based on patient and insurance preference. Use up to four times daily as directed. (FOR ICD-10 E10.9, E11.9). 1 each 0   Calcium-Vitamin D-Vitamin K (CALCIUM + D + K PO) Take 1 tablet by mouth in the morning.     carvedilol (COREG) 3.125 MG tablet Take 1 tablet (3.125 mg total) by mouth 2 (two) times daily with a meal. 180 tablet 1   dofetilide (TIKOSYN) 500 MCG capsule Take 1 capsule (500 mcg total) by mouth 2 (two) times daily. 180 capsule 2   fluocinolone (SYNALAR) 0.01 % external solution Apply 1 application topically 2 (two) times daily as needed (skin irritation).     folic acid (FOLVITE) 856 MCG tablet Take 800 mcg by mouth daily.     furosemide (LASIX) 20 MG tablet As needed patient may  take 40 MG Lasix PRN by mouth daily x 4 days as directed per Alleviate Research HF Study PRN plan. 90 tablet 3   gabapentin (NEURONTIN) 300 MG capsule Take 300 mg by mouth at bedtime.     HYDROcodone bit-homatropine (HYCODAN) 5-1.5 MG/5ML syrup Take 5 mLs by mouth daily.     HYDROcodone-acetaminophen (NORCO) 5-325 MG tablet Take 1 tablet by mouth every 4 (four) hours as needed for moderate pain (kidney stone pain). 20 tablet 0   Insulin Glargine (BASAGLAR KWIKPEN [place]) Inject 34 Units into the skin in the morning.     Insulin Pen Needle 31G X 5 MM MISC 12 Units by Does not apply route 3 (three) times daily. 100 each 0   Ipratropium-Albuterol (COMBIVENT RESPIMAT) 20-100 MCG/ACT AERS respimat Inhale 1 puff into the lungs every 6 (six) hours as needed for wheezing or shortness of breath. Use 3 times daily x 5 days, then every 6 hours as needed. 4 g 1   metFORMIN (GLUCOPHAGE) 500 MG tablet Take 500 mg by mouth 2 (two) times daily with a meal.     Misc Natural Products  (GLUCOSAMINE CHONDROITIN TRIPLE) TABS Take 2 tablets by mouth daily.      Multiple Vitamins-Minerals (CENTRUM SILVER) CHEW Chew 2 tablets by mouth daily. soft chews     pantoprazole (PROTONIX) 40 MG tablet Take 40 mg by mouth 2 (two) times daily.     Polyethyl Glycol-Propyl Glycol (SYSTANE) 0.4-0.3 % SOLN Place 1-2 drops into both eyes 3 (three) times daily as needed (dry/irritated eyes).     potassium chloride (KLOR-CON M10) 10 MEQ tablet TAKE 2 TABLETS BY MOUTH TWICE A DAY 360 tablet 2   psyllium (METAMUCIL SMOOTH TEXTURE) 28 % packet Take 1 packet by mouth in the morning.     Semaglutide,0.25 or 0.5MG/DOS, (OZEMPIC, 0.25 OR 0.5 MG/DOSE,) 2 MG/3ML SOPN 0.77m weekly x 4 weeks, then 0.519mweekly     simvastatin (ZOCOR) 10 MG tablet Take 1 tablet (10 mg total) by mouth at bedtime. 90 tablet 1   spironolactone (ALDACTONE) 25 MG tablet TAKE 1/2 TABLET DAILY 45 tablet 2   triamcinolone cream (KENALOG) 0.1 % Apply 1 application topically 2 (two) times daily as needed (itching legs).      XARELTO 20 MG TABS tablet TAKE 1 TABLET BY MOUTH EVERY DAY 90 tablet 1   furosemide (LASIX) 20 MG tablet Take 2 tablets (40 mg total) by mouth daily. 180 tablet 0   No current facility-administered medications for this encounter.    Allergies  Allergen Reactions   Diphenhydramine Other (See Comments)    heart issues   No [employer] Allergies     Defibrillator pads cause rash.    Social History   Socioeconomic History   Marital status: Unknown    Spouse name: Not on file   Number of children: Not on file   Years of education: Not on file   Highest education level: Not on file  Occupational History   Not on file  Tobacco Use   Smoking status: Never   Smokeless tobacco: Never   Tobacco comments:    Never smoke 05/19/21  Vaping Use   Vaping Use: Never used  Substance and Sexual Activity   Alcohol use: Not Currently   Drug use: No   Sexual activity: Never  Other Topics Concern   Not on file   Social History Narrative   ** Merged History Encounter **       Social Determinants of Health  Financial Resource Strain: Not on file  Food Insecurity: Not on file  Transportation Needs: Not on file  Physical Activity: Not on file  Stress: Not on file  Social Connections: Not on file  Intimate Partner Violence: Not on file     ROS- All systems are reviewed and negative except as per the HPI above.  Physical Exam: Vitals:   12/01/21 1353  BP: 126/76  Pulse: 73  Weight: 124.2 kg  Height: 5' 1"  (1.549 m)    GEN- The patient is a well appearing obese female, alert and oriented x 3 today.   HEENT-head normocephalic, atraumatic, sclera clear, conjunctiva pink, hearing intact, trachea midline. Lungs- Clear to ausculation bilaterally, normal work of breathing Heart- Regular rate and rhythm, no murmurs, rubs or gallops  GI- soft, NT, ND, + BS Extremities- no clubbing, cyanosis, or edema MS- no significant deformity or atrophy Skin- no rash or lesion Psych- euthymic mood, full affect Neuro- strength and sensation are intact   Wt Readings from Last 3 Encounters:  12/01/21 124.2 kg  10/23/21 123.8 kg  05/19/21 130.6 kg    EKG today demonstrates  SR, PAC, LAFB Vent. rate 73 BPM PR interval 158 ms QRS duration 110 ms QT/QTcB 462/508 ms  Echo 04/03/21 demonstrated  1. Left ventricular ejection fraction, by estimation, is 50 to 55%. The  left ventricle has low normal function. The left ventricle has no regional wall motion abnormalities. There is mild left ventricular hypertrophy. Left ventricular diastolic parameters are indeterminate.   2. Right ventricular systolic function is normal. The right ventricular  size is normal. Moderately increased right ventricular wall thickness.  Tricuspid regurgitation signal is inadequate for assessing PA pressure.   3. A small pericardial effusion is present.   4. The mitral valve is normal in structure. No evidence of mitral valve  regurgitation. No evidence of mitral stenosis.   5. The aortic valve was not well visualized. Aortic valve regurgitation  is trivial. Aortic valve sclerosis/calcification is present, without any  evidence of aortic stenosis.   6. The inferior vena cava is normal in size with greater than 50%  respiratory variability, suggesting right atrial pressure of 3 mmHg.   Epic records are reviewed at length today  CHA2DS2-VASc Score = 5  The patient's score is based upon: CHF History: 1 HTN History: 1 Diabetes History: 1 Stroke History: 0 Vascular Disease History: 0 Age Score: 1 Gender Score: 1        ASSESSMENT AND PLAN: 1. Persistent Atrial Fibrillation (ICD10:  I48.19) The patient's CHA2DS2-VASc score is 5, indicating a 7.2% annual risk of stroke.  S/p dofetilide admission 7/19-7/22/22 Patient appears to be maintaining SR.  Continue dofetilide 500 mcg BID. QT stable (manually measured 480-490 ms)  Check bmet/mag today.  Continue Continue Xarelto 20 mg daily Continue Coreg 3.125 mg BID   2. Secondary Hypercoagulable State (ICD10:  D68.69) The patient is at significant risk for stroke/thromboembolism based upon her CHA2DS2-VASc Score of 5.  Continue Rivaroxaban (Xarelto).   3. Obesity Body mass index is 51.73 kg/m. Lifestyle modification was discussed and encouraged including regular physical activity and weight reduction. Offered referral to [facility], patient declined at this time. She will try to use her exercise equipment at home.   4. Obstructive sleep apnea Followed by Dr Radford Pax. Encouraged compliance with CPAP therapy.   5. HTN Stable, no changes today.   6. Chronic diastolic CHF Enrolled in Alleviate HF Fluid status appears stable.    Follow up in  the AF clinic in 6 months.    [facility] [address] [place], [place] [address] [phone] 12/02/2021 8:28 AM

## 2021-12-07 DIAGNOSIS — E1169 Type 2 diabetes mellitus with other specified complication: Secondary | ICD-10-CM | POA: Diagnosis not present

## 2021-12-07 DIAGNOSIS — I10 Essential (primary) hypertension: Secondary | ICD-10-CM | POA: Diagnosis not present

## 2021-12-07 DIAGNOSIS — E785 Hyperlipidemia, unspecified: Secondary | ICD-10-CM | POA: Diagnosis not present

## 2021-12-07 DIAGNOSIS — K219 Gastro-esophageal reflux disease without esophagitis: Secondary | ICD-10-CM | POA: Diagnosis not present

## 2021-12-07 DIAGNOSIS — M810 Age-related osteoporosis without current pathological fracture: Secondary | ICD-10-CM | POA: Diagnosis not present

## 2021-12-07 DIAGNOSIS — Z006 Encounter for examination for normal comparison and control in clinical research program: Secondary | ICD-10-CM

## 2021-12-07 NOTE — Research (Addendum)
Alleviate HF Research Study   Medications reviewed and action taken:   -'[]'$  Medication changes made                       -'[]'$  No medication change     **If no medication changes , specify rationale**                       -'[]'$  PRN medication intervention changes  Subject now taking Metformin 500 MG QD. Changed by family MD.

## 2021-12-08 ENCOUNTER — Ambulatory Visit: Payer: Medicare Other

## 2021-12-23 ENCOUNTER — Other Ambulatory Visit (HOSPITAL_COMMUNITY): Payer: Self-pay | Admitting: *Deleted

## 2021-12-23 MED ORDER — DOFETILIDE 500 MCG PO CAPS: 500.0000 ug | ORAL_CAPSULE | Freq: Two times a day (BID) | ORAL | 2 refills | Status: DC

## 2021-12-25 ENCOUNTER — Telehealth: Payer: Self-pay | Admitting: Cardiology

## 2021-12-25 DIAGNOSIS — Z006 Encounter for examination for normal comparison and control in clinical research program: Secondary | ICD-10-CM

## 2021-12-25 NOTE — Research (Signed)
   Alleviate HF Research Study     Medications reviewed and action taken:   -'[]'$  Medication changes made                       -'[]'$  No medication change     **If no medication changes , specify rationale**                       -'[]'$  PRN medication intervention changes

## 2021-12-25 NOTE — Telephone Encounter (Signed)
Pt c/o medication issue:  1. Name of Medication:   furosemide (LASIX) 20 MG tablet   2. How are you currently taking this medication (dosage and times per day)? As prescribed  3. Are you having a reaction (difficulty breathing--STAT)?   4. What is your medication issue?   Patient stated she is a part of the [facility] program and she received a call from the nurse today to increase her Lasik to 80 mg today for 4 days as she has fluid build up around her heart.  Patient wanted Dr. Radford Pax to know and advise if there is anything else she needs to be doing.

## 2021-12-25 NOTE — Telephone Encounter (Signed)
Follow up call to patient to make her aware that Dr Radford Pax would like her to discuss any concerns she may have about the [employer] program with [employer].   Provided education on importance of avoiding high sodium foods and processed foods.  Patient verbalized understanding.

## 2021-12-25 NOTE — Telephone Encounter (Signed)
Spoke with patient who expresses concern that she has had to take a 4 day course of Lasix 80 mg with the [employer] program several times. She wants Dr Radford Pax to be aware. She also wants to know if there is anything that can be prescribed to prevent this from reoccurring.  I advised I would forward her concerns to Dr. Radford Pax for review.

## 2022-01-12 DIAGNOSIS — E119 Type 2 diabetes mellitus without complications: Secondary | ICD-10-CM | POA: Diagnosis not present

## 2022-01-12 DIAGNOSIS — Z961 Presence of intraocular lens: Secondary | ICD-10-CM | POA: Diagnosis not present

## 2022-01-12 DIAGNOSIS — H26493 Other secondary cataract, bilateral: Secondary | ICD-10-CM | POA: Diagnosis not present

## 2022-01-12 DIAGNOSIS — H18413 Arcus senilis, bilateral: Secondary | ICD-10-CM | POA: Diagnosis not present

## 2022-01-12 DIAGNOSIS — Z006 Encounter for examination for normal comparison and control in clinical research program: Secondary | ICD-10-CM

## 2022-01-12 DIAGNOSIS — H26492 Other secondary cataract, left eye: Secondary | ICD-10-CM | POA: Diagnosis not present

## 2022-01-15 ENCOUNTER — Other Ambulatory Visit: Payer: Self-pay | Admitting: Student

## 2022-01-20 DIAGNOSIS — Z9842 Cataract extraction status, left eye: Secondary | ICD-10-CM | POA: Diagnosis not present

## 2022-02-04 DIAGNOSIS — H26491 Other secondary cataract, right eye: Secondary | ICD-10-CM | POA: Diagnosis not present

## 2022-02-10 DIAGNOSIS — Z961 Presence of intraocular lens: Secondary | ICD-10-CM | POA: Diagnosis not present

## 2022-02-17 DIAGNOSIS — G4733 Obstructive sleep apnea (adult) (pediatric): Secondary | ICD-10-CM | POA: Diagnosis not present

## 2022-02-17 DIAGNOSIS — I509 Heart failure, unspecified: Secondary | ICD-10-CM | POA: Diagnosis not present

## 2022-02-17 DIAGNOSIS — E1169 Type 2 diabetes mellitus with other specified complication: Secondary | ICD-10-CM | POA: Diagnosis not present

## 2022-02-17 DIAGNOSIS — Z6841 Body Mass Index (BMI) 40.0 and over, adult: Secondary | ICD-10-CM | POA: Diagnosis not present

## 2022-02-17 DIAGNOSIS — K224 Dyskinesia of esophagus: Secondary | ICD-10-CM | POA: Diagnosis not present

## 2022-02-22 DIAGNOSIS — Z006 Encounter for examination for normal comparison and control in clinical research program: Secondary | ICD-10-CM

## 2022-03-01 DIAGNOSIS — E119 Type 2 diabetes mellitus without complications: Secondary | ICD-10-CM | POA: Diagnosis not present

## 2022-03-02 NOTE — Research (Signed)
   Alleviate HF Research Study     Medications reviewed and action taken:   -'[]'$  Medication changes made                       -'[x]'$  No medication change     **If no medication changes , specify rationale**                       -'[]'$  PRN medication intervention c

## 2022-03-11 ENCOUNTER — Telehealth: Payer: Self-pay | Admitting: Cardiology

## 2022-03-11 DIAGNOSIS — I4819 Other persistent atrial fibrillation: Secondary | ICD-10-CM

## 2022-03-11 MED ORDER — XARELTO 20 MG PO TABS: 20.0000 mg | ORAL_TABLET | Freq: Every day | ORAL | 1 refills | Status: DC

## 2022-03-11 NOTE — Telephone Encounter (Signed)
*  STAT* If patient is at the pharmacy, call can be transferred to refill team.   1. Which medications need to be refilled? (please list name of each medication and dose if known) XARELTO 20 MG TABS tablet   2. Which pharmacy/location (including street and city if local pharmacy) is medication to be sent to? [id number] [place], [place] [address]  3. Do they need a 30 day or 90 day supply? [address]

## 2022-03-11 NOTE — Telephone Encounter (Signed)
Prescription refill request for Xarelto received.  Indication: a fib Last office visit: 12/01/21 Weight: 273 Age: 77 Scr: 0.92 CrCl: 105 ml/min

## 2022-03-12 DIAGNOSIS — Z006 Encounter for examination for normal comparison and control in clinical research program: Secondary | ICD-10-CM

## 2022-03-12 MED ORDER — FUROSEMIDE 20 MG PO TABS: ORAL_TABLET | ORAL | 2 refills | Status: DC

## 2022-03-12 NOTE — Addendum Note (Signed)
Addended by: Rubie Maid B on: 03/12/2022 11:50 AM   Modules accepted: Orders

## 2022-03-12 NOTE — Research (Addendum)
Alleviate HF Research Study     Medications reviewed and action taken:   -[]$  Medication changes made                       -[]$  No medication change     **If no medication changes , specify rationale**                       -[]$  PRN medication intervention changes

## 2022-03-12 NOTE — Addendum Note (Signed)
Addended by: Rubie Maid B on: 03/12/2022 11:47 AM   Modules accepted: Orders

## 2022-03-17 DIAGNOSIS — Z6841 Body Mass Index (BMI) 40.0 and over, adult: Secondary | ICD-10-CM | POA: Diagnosis not present

## 2022-03-17 DIAGNOSIS — E1169 Type 2 diabetes mellitus with other specified complication: Secondary | ICD-10-CM | POA: Diagnosis not present

## 2022-03-17 DIAGNOSIS — I509 Heart failure, unspecified: Secondary | ICD-10-CM | POA: Diagnosis not present

## 2022-04-13 DIAGNOSIS — E7800 Pure hypercholesterolemia, unspecified: Secondary | ICD-10-CM | POA: Diagnosis not present

## 2022-04-13 DIAGNOSIS — M810 Age-related osteoporosis without current pathological fracture: Secondary | ICD-10-CM | POA: Diagnosis not present

## 2022-04-13 DIAGNOSIS — D6869 Other thrombophilia: Secondary | ICD-10-CM | POA: Diagnosis not present

## 2022-04-13 DIAGNOSIS — M1712 Unilateral primary osteoarthritis, left knee: Secondary | ICD-10-CM | POA: Diagnosis not present

## 2022-04-13 DIAGNOSIS — M1711 Unilateral primary osteoarthritis, right knee: Secondary | ICD-10-CM | POA: Diagnosis not present

## 2022-04-13 DIAGNOSIS — M160 Bilateral primary osteoarthritis of hip: Secondary | ICD-10-CM | POA: Diagnosis not present

## 2022-04-13 DIAGNOSIS — E1169 Type 2 diabetes mellitus with other specified complication: Secondary | ICD-10-CM | POA: Diagnosis not present

## 2022-04-13 DIAGNOSIS — Z6841 Body Mass Index (BMI) 40.0 and over, adult: Secondary | ICD-10-CM | POA: Diagnosis not present

## 2022-04-13 DIAGNOSIS — I4891 Unspecified atrial fibrillation: Secondary | ICD-10-CM | POA: Diagnosis not present

## 2022-04-16 DIAGNOSIS — Z006 Encounter for examination for normal comparison and control in clinical research program: Secondary | ICD-10-CM

## 2022-04-16 NOTE — Research (Signed)
Alleviate HF Research Study  18 Month follow-up  No adverse events or cardiovascular medication changes to report at this time.  EQ 5D 5L completed  Current Outpatient Medications:    acetaminophen (TYLENOL) 650 MG CR tablet, Take 650 mg by mouth 2 (two) times daily as needed for pain., Disp: , Rfl:    alendronate (FOSAMAX) 70 MG tablet, Take 70 mg by mouth every Sunday. Take with a full glass of water on an empty stomach., Disp: , Rfl:    betamethasone dipropionate 0.05 % cream, Apply 1 application topically 2 (two) times daily., Disp: , Rfl:    Biotin w/ Vitamins C & E (HAIR SKIN & NAILS GUMMIES PO), Take 2 tablets by mouth daily. , Disp: , Rfl:    blood glucose meter kit and supplies, Dispense based on patient and insurance preference. Use up to four times daily as directed. (FOR ICD-10 E10.9, E11.9)., Disp: 1 each, Rfl: 0   Calcium-Vitamin D-Vitamin K (CALCIUM + D + K PO), Take 1 tablet by mouth in the morning., Disp: , Rfl:    carvedilol (COREG) 3.125 MG tablet, Take 1 tablet (3.125 mg total) by mouth 2 (two) times daily with a meal., Disp: 180 tablet, Rfl: 1   dofetilide (TIKOSYN) 500 MCG capsule, Take 1 capsule (500 mcg total) by mouth 2 (two) times daily., Disp: 180 capsule, Rfl: 2   fluocinolone (SYNALAR) 0.01 % external solution, Apply 1 application topically 2 (two) times daily as needed (skin irritation)., Disp: , Rfl:    folic acid (FOLVITE) [id number] MCG tablet, Take 800 mcg by mouth daily., Disp: , Rfl:    furosemide (LASIX) 20 MG tablet, Take 2 tablets (40 MG) daily. As needed patient may take 40 MG additional Lasix PRN by mouth daily x 4 days as directed per Alleviate Research HF Study PRN plan., Disp: 180 tablet, Rfl: 2   gabapentin (NEURONTIN) 300 MG capsule, Take 300 mg by mouth at bedtime., Disp: , Rfl:    HYDROcodone bit-homatropine (HYCODAN) 5-1.5 MG/5ML syrup, Take 5 mLs by mouth daily., Disp: , Rfl:    HYDROcodone-acetaminophen (NORCO) 5-325 MG tablet, Take 1 tablet by mouth  every 4 (four) hours as needed for moderate pain (kidney stone pain)., Disp: 20 tablet, Rfl: 0   Insulin Glargine (BASAGLAR KWIKPEN [place]), Inject 34 Units into the skin in the morning., Disp: , Rfl:    Insulin Pen Needle 31G X 5 MM MISC, 12 Units by Does not apply route 3 (three) times daily., Disp: 100 each, Rfl: 0   Ipratropium-Albuterol (COMBIVENT RESPIMAT) 20-100 MCG/ACT AERS respimat, Inhale 1 puff into the lungs every 6 (six) hours as needed for wheezing or shortness of breath. Use 3 times daily x 5 days, then every 6 hours as needed. (Patient not taking: Reported on 12/07/2021), Disp: 4 g, Rfl: 1   metFORMIN (GLUCOPHAGE) 500 MG tablet, Take 500 mg by mouth 2 (two) times daily with a meal., Disp: , Rfl:    Misc Natural Products (GLUCOSAMINE CHONDROITIN TRIPLE) TABS, Take 2 tablets by mouth daily. , Disp: , Rfl:    Multiple Vitamins-Minerals (CENTRUM SILVER) CHEW, Chew 2 tablets by mouth daily. soft chews, Disp: , Rfl:    pantoprazole (PROTONIX) 40 MG tablet, Take 40 mg by mouth 2 (two) times daily., Disp: , Rfl:    Polyethyl Glycol-Propyl Glycol (SYSTANE) 0.4-0.3 % SOLN, Place 1-2 drops into both eyes 3 (three) times daily as needed (dry/irritated eyes)., Disp: , Rfl:    potassium chloride (KLOR-CON M10) 10 MEQ  tablet, TAKE 2 TABLETS BY MOUTH TWICE A DAY, Disp: 360 tablet, Rfl: 2   psyllium (METAMUCIL SMOOTH TEXTURE) 28 % packet, Take 1 packet by mouth in the morning., Disp: , Rfl:    Semaglutide,0.25 or 0.'5MG'$ /DOS, (OZEMPIC, 0.25 OR 0.5 MG/DOSE,) 2 MG/3ML SOPN, 0.'25mg'$  weekly x 4 weeks, then 0.'5mg'$  weekly, Disp: , Rfl:    simvastatin (ZOCOR) 10 MG tablet, Take 1 tablet (10 mg total) by mouth at bedtime., Disp: 90 tablet, Rfl: 1   spironolactone (ALDACTONE) 25 MG tablet, TAKE 1/2 TABLET (12.'5MG'$  TOTAL) DAILY, Disp: 45 tablet, Rfl: 2   triamcinolone cream (KENALOG) 0.1 %, Apply 1 application topically 2 (two) times daily as needed (itching legs). , Disp: , Rfl:    XARELTO 20 MG TABS tablet, Take 1  tablet (20 mg total) by mouth daily., Disp: 90 tablet, Rfl: 1

## 2022-05-03 ENCOUNTER — Ambulatory Visit: Payer: Medicare Other | Admitting: Cardiology

## 2022-05-05 ENCOUNTER — Ambulatory Visit: Payer: Medicare Other | Admitting: Cardiology

## 2022-05-05 DIAGNOSIS — Z006 Encounter for examination for normal comparison and control in clinical research program: Secondary | ICD-10-CM

## 2022-05-05 MED ORDER — FUROSEMIDE 40 MG PO TABS: ORAL_TABLET | ORAL | 2 refills | Status: DC

## 2022-05-05 NOTE — Research (Signed)
Alleviate HF Research Study     Medications reviewed and action taken:   -[] Medication changes made                       -[] No medication change     **If no medication changes , specify rationale**                       -[] PRN medication intervention changes              

## 2022-05-12 ENCOUNTER — Other Ambulatory Visit: Payer: Self-pay | Admitting: Gastroenterology

## 2022-05-12 DIAGNOSIS — R1010 Upper abdominal pain, unspecified: Secondary | ICD-10-CM | POA: Diagnosis not present

## 2022-05-12 DIAGNOSIS — K224 Dyskinesia of esophagus: Secondary | ICD-10-CM | POA: Diagnosis not present

## 2022-05-13 DIAGNOSIS — I8312 Varicose veins of left lower extremity with inflammation: Secondary | ICD-10-CM | POA: Diagnosis not present

## 2022-05-13 DIAGNOSIS — D1801 Hemangioma of skin and subcutaneous tissue: Secondary | ICD-10-CM | POA: Diagnosis not present

## 2022-05-13 DIAGNOSIS — L821 Other seborrheic keratosis: Secondary | ICD-10-CM | POA: Diagnosis not present

## 2022-05-13 DIAGNOSIS — I872 Venous insufficiency (chronic) (peripheral): Secondary | ICD-10-CM | POA: Diagnosis not present

## 2022-05-13 DIAGNOSIS — D225 Melanocytic nevi of trunk: Secondary | ICD-10-CM | POA: Diagnosis not present

## 2022-05-13 DIAGNOSIS — D2272 Melanocytic nevi of left lower limb, including hip: Secondary | ICD-10-CM | POA: Diagnosis not present

## 2022-05-13 DIAGNOSIS — L650 Telogen effluvium: Secondary | ICD-10-CM | POA: Diagnosis not present

## 2022-05-13 DIAGNOSIS — I8311 Varicose veins of right lower extremity with inflammation: Secondary | ICD-10-CM | POA: Diagnosis not present

## 2022-05-17 DIAGNOSIS — Z6841 Body Mass Index (BMI) 40.0 and over, adult: Secondary | ICD-10-CM | POA: Diagnosis not present

## 2022-05-17 DIAGNOSIS — M1711 Unilateral primary osteoarthritis, right knee: Secondary | ICD-10-CM | POA: Diagnosis not present

## 2022-05-17 DIAGNOSIS — I509 Heart failure, unspecified: Secondary | ICD-10-CM | POA: Diagnosis not present

## 2022-05-17 DIAGNOSIS — E1169 Type 2 diabetes mellitus with other specified complication: Secondary | ICD-10-CM | POA: Diagnosis not present

## 2022-05-19 NOTE — Progress Notes (Signed)
No change in medication plan.  Sheria Lang T. Lalla Brothers, MD, [facility], [facility] Cardiac Electrophysiology

## 2022-05-20 ENCOUNTER — Other Ambulatory Visit: Payer: Self-pay | Admitting: Gastroenterology

## 2022-05-25 DIAGNOSIS — Z006 Encounter for examination for normal comparison and control in clinical research program: Secondary | ICD-10-CM

## 2022-05-25 NOTE — Research (Signed)
Alleviate HF Research Study     Medications reviewed and action taken:   -[] Medication changes made                       -[] No medication change     **If no medication changes , specify rationale**                       -[] PRN medication intervention changes              

## 2022-06-01 ENCOUNTER — Ambulatory Visit (HOSPITAL_COMMUNITY)
Admission: RE | Admit: 2022-06-01 | Discharge: 2022-06-01 | Disposition: A | Discharge status: Home / Self Care | Payer: Medicare Other | Source: Ambulatory Visit | Attending: Physician Assistant | Admitting: Physician Assistant

## 2022-06-01 ENCOUNTER — Encounter (HOSPITAL_COMMUNITY): Payer: Self-pay | Admitting: Physician Assistant

## 2022-06-01 VITALS — BP 116/68 | HR 67 | Ht 61.0 in | Wt 265.0 lb

## 2022-06-01 DIAGNOSIS — I5032 Chronic diastolic (congestive) heart failure: Secondary | ICD-10-CM | POA: Diagnosis not present

## 2022-06-01 DIAGNOSIS — D6869 Other thrombophilia: Secondary | ICD-10-CM

## 2022-06-01 DIAGNOSIS — E669 Obesity, unspecified: Secondary | ICD-10-CM | POA: Insufficient documentation

## 2022-06-01 DIAGNOSIS — I110 Hypertensive heart disease with heart failure: Secondary | ICD-10-CM | POA: Diagnosis not present

## 2022-06-01 DIAGNOSIS — Z7985 Long-term (current) use of injectable non-insulin antidiabetic drugs: Secondary | ICD-10-CM | POA: Insufficient documentation

## 2022-06-01 DIAGNOSIS — E119 Type 2 diabetes mellitus without complications: Secondary | ICD-10-CM | POA: Diagnosis not present

## 2022-06-01 DIAGNOSIS — I4819 Other persistent atrial fibrillation: Secondary | ICD-10-CM | POA: Diagnosis not present

## 2022-06-01 DIAGNOSIS — Z6841 Body Mass Index (BMI) 40.0 and over, adult: Secondary | ICD-10-CM | POA: Insufficient documentation

## 2022-06-01 DIAGNOSIS — Z79899 Other long term (current) drug therapy: Secondary | ICD-10-CM | POA: Diagnosis not present

## 2022-06-01 DIAGNOSIS — Z7901 Long term (current) use of anticoagulants: Secondary | ICD-10-CM | POA: Diagnosis not present

## 2022-06-01 DIAGNOSIS — L659 Nonscarring hair loss, unspecified: Secondary | ICD-10-CM | POA: Diagnosis not present

## 2022-06-01 DIAGNOSIS — Z5181 Encounter for therapeutic drug level monitoring: Secondary | ICD-10-CM | POA: Diagnosis not present

## 2022-06-01 DIAGNOSIS — G4733 Obstructive sleep apnea (adult) (pediatric): Secondary | ICD-10-CM | POA: Insufficient documentation

## 2022-06-01 DIAGNOSIS — R5383 Other fatigue: Secondary | ICD-10-CM | POA: Insufficient documentation

## 2022-06-01 LAB — BASIC METABOLIC PANEL
Anion gap: 11 (ref 5–15)
BUN: 13 mg/dL (ref 8–23)
CO2: 27 mmol/L (ref 22–32)
Calcium: 9.8 mg/dL (ref 8.9–10.3)
Chloride: 101 mmol/L (ref 98–111)
Creatinine, Ser: 0.91 mg/dL (ref 0.44–1.00)
GFR, Estimated: >60 mL/min (ref >60)
Glucose, Bld: 151 mg/dL — ABNORMAL HIGH (ref 70–99)
Potassium: 3.9 mmol/L (ref 3.5–5.1)
Sodium: 139 mmol/L (ref 135–145)

## 2022-06-01 LAB — MAGNESIUM: Magnesium: 2.4 mg/dL (ref 1.7–2.4)

## 2022-06-01 LAB — T4, FREE: Free T4: 0.71 ng/dL (ref 0.61–1.12)

## 2022-06-01 LAB — TSH: TSH: 2.379 u[IU]/mL (ref 0.350–4.500)

## 2022-06-01 MED ORDER — DOFETILIDE 500 MCG PO CAPS: 500.0000 ug | ORAL_CAPSULE | Freq: Two times a day (BID) | ORAL | 2 refills | Status: DC

## 2022-06-01 NOTE — Progress Notes (Signed)
Primary Care Physician: Sigmund Hazel, MD Primary Cardiologist: Dr Mayford Knife Primary Electrophysiologist: Dr Johney Frame  Referring Physician: Dr Romilda Joy is a 77 y.o. female with a history of HTN, DM, chronic diastolic CHF, atrial fibrillation who presents for follow up in the [facility]. Seen previously by Rudi Coco in 2018. She has been maintained on sotalol since 2018. Patient is on Xarelto for a CHADS2VASC score of 5. She was seen by Dr Mayford Knife on 05/13/20 and was found to be back in afib. She admits she had stayed up late for a few nights and had not used her CPAP. She has noticed increased fatigue since then. Patient is s/p DCCV 06/03/20. She had an echocardiogram on 07/18/20 and was noted to be back in afib. Patient is s/p dofetilide admission 7/19-7/22/22. She converted with the medication and did not require DCCV.   On follow up today, patient reports that she has done well from an afib standpoint. ILR shows two brief episodes recently but overall burden is still 0%. She has been started on Ozempic and has noted some weight loss. Her primary concern is feeling that food is getting caught in her throat. She has an esophageal manometry upcoming this summer.   Today, she denies symptoms of palpitations, chest pain, shortness of breath, orthopnea, PND, lower extremity edema, dizziness, presyncope, syncope, bleeding, or neurologic sequela. The patient is tolerating medications without difficulties and is otherwise without complaint today.    Atrial Fibrillation Risk Factors:  she does have symptoms or diagnosis of sleep apnea. she is compliant with CPAP therapy. she does not have a history of rheumatic fever.   she has a BMI of Body mass index is 50.07 kg/m.Marland Kitchen Filed Weights   06/01/22 1354  Weight: 120.2 kg    Family History  Problem Relation Age of Onset   Cancer Father    Heart failure Father    Diabetes Father    CAD Mother    Diabetes  Brother    Hypertension Brother    Breast cancer Neg Hx      Atrial Fibrillation Management history:  Previous antiarrhythmic drugs: sotalol, dofetilide  Previous cardioversions: 2018 x 3, 06/03/20 Previous ablations: none CHADS2VASC score: 5 Anticoagulation history: Xarelto    Past Medical History:  Diagnosis Date   Abdominal pain, left lower quadrant    Asthmatic bronchitis    Benign essential HTN 04/26/2016   Bursitis of hip    Chronic diastolic CHF (congestive heart failure)    DCM (dilated cardiomyopathy)    EF 50-55% in Jan 2023   GERD (gastroesophageal reflux disease)    High cholesterol    History of kidney stones    Hypersomnia    Morbid obesity with BMI of 50.0-59.9, adult    OSA on CPAP    Osteoarthritis    "right hip; both knees" (09/21/2016)   Persistent atrial fibrillation 04/26/2016   Type II diabetes mellitus    Vitamin D deficiency disease    Past Surgical History:  Procedure Laterality Date   ABDOMINAL HYSTERECTOMY     APPENDECTOMY     BALLOON DILATION N/A 11/17/2017   Procedure: BALLOON DILATION;  Surgeon: Kerin Salen, MD;  Location: [facility];  Service: Gastroenterology;  Laterality: N/A;   BIOPSY  11/17/2017   Procedure: BIOPSY;  Surgeon: Kerin Salen, MD;  Location: [facility];  Service: Gastroenterology;;   CARDIAC CATHETERIZATION     CARDIOVERSION N/A 06/11/2016   Procedure: CARDIOVERSION;  Surgeon: Delton See,  Faustino Congress, MD;  Location: [facility] ENDOSCOPY;  Service: Cardiovascular;  Laterality: N/A;   CARDIOVERSION N/A 07/16/2016   Procedure: CARDIOVERSION;  Surgeon: Vesta Mixer, MD;  Location: [facility] ENDOSCOPY;  Service: Cardiovascular;  Laterality: N/A;   CARDIOVERSION N/A 09/23/2016   Procedure: CARDIOVERSION;  Surgeon: Thurmon Fair, MD;  Location: [facility] ENDOSCOPY;  Service: Cardiovascular;  Laterality: N/A;   CARDIOVERSION N/A 06/03/2020   Procedure: CARDIOVERSION;  Surgeon: Little Ishikawa, MD;  Location: [facility] ENDOSCOPY;  Service:  Cardiovascular;  Laterality: N/A;   CHOLECYSTECTOMY OPEN     COLONOSCOPY N/A 11/17/2017   Procedure: COLONOSCOPY;  Surgeon: Kerin Salen, MD;  Location: [facility];  Service: Gastroenterology;  Laterality: N/A;   cortisone injection to right knee     costisone injection to left knee  11/03/2017   CYSTOSCOPY WITH RETROGRADE PYELOGRAM, URETEROSCOPY AND STENT PLACEMENT Left 01/14/2019   Procedure: CYSTOSCOPy  STENT PLACEMENT;  Surgeon: Noel Christmas, MD;  Location: [facility];  Service: Urology;  Laterality: Left;   CYSTOSCOPY WITH RETROGRADE PYELOGRAM, URETEROSCOPY AND STENT PLACEMENT Left 03/06/2019   Procedure: CYSTOSCOPY WITH RETROGRADE PYELOGRAM, URETEROSCOPY AND STENT PLACEMENT;  Surgeon: Noel Christmas, MD;  Location: [facility];  Service: Urology;  Laterality: Left;  90 MINS   CYSTOSCOPY WITH STENT PLACEMENT Left 03/07/2019   Procedure: CYSTOSCOPY WITH , URETEROSCOPY/ STENT PLACEMENT;  Surgeon: Crist Fat, MD;  Location: [facility];  Service: Urology;  Laterality: Left;   DILATION AND CURETTAGE OF UTERUS     S/P miscarriage   ESOPHAGOGASTRODUODENOSCOPY N/A 11/17/2017   Procedure: ESOPHAGOGASTRODUODENOSCOPY (EGD);  Surgeon: Kerin Salen, MD;  Location: [facility];  Service: Gastroenterology;  Laterality: N/A;   HOLMIUM LASER APPLICATION Left 03/06/2019   Procedure: HOLMIUM LASER APPLICATION;  Surgeon: Noel Christmas, MD;  Location: [facility];  Service: Urology;  Laterality: Left;   implantable loop recorder placement  10/20/2020   [employer] Reveal Linq model LNQ 11 (SN [id number]) implantable loop recorder implanted for Alleviate HF trial   NASAL SEPTUM SURGERY     POLYPECTOMY  11/17/2017   Procedure: POLYPECTOMY;  Surgeon: Kerin Salen, MD;  Location: [facility];  Service: Gastroenterology;;   TONSILLECTOMY      Current Outpatient Medications  Medication Sig Dispense Refill   acetaminophen (TYLENOL) 650 MG CR tablet Take 650 mg by mouth 2 (two) times daily as needed for pain.      alendronate (FOSAMAX) 70 MG tablet Take 70 mg by mouth every Sunday. Take with a full glass of water on an empty stomach.     betamethasone dipropionate 0.05 % cream Apply 1 application topically 2 (two) times daily.     Biotin w/ Vitamins C & E (HAIR SKIN & NAILS GUMMIES PO) Take 2 tablets by mouth daily.      blood glucose meter kit and supplies Dispense based on patient and insurance preference. Use up to four times daily as directed. (FOR ICD-10 E10.9, E11.9). 1 each 0   Calcium-Vitamin D-Vitamin K (CALCIUM + D + K PO) Take 1 tablet by mouth in the morning.     carvedilol (COREG) 3.125 MG tablet Take 1 tablet (3.125 mg total) by mouth 2 (two) times daily with a meal. 180 tablet 1   fluocinolone (SYNALAR) 0.01 % external solution Apply 1 application topically 2 (two) times daily as needed (skin irritation).     folic acid (FOLVITE) 800 MCG tablet Take 800 mcg by mouth daily.     furosemide (LASIX) 40 MG tablet Take 1 tablet (40 MG)  daily. As needed patient may take 40 MG additional Lasix PRN by mouth daily x 4 days as directed per Alleviate Research HF Study PRN plan. 180 tablet 2   gabapentin (NEURONTIN) 300 MG capsule Take 300 mg by mouth at bedtime.     HYDROcodone bit-homatropine (HYCODAN) 5-1.5 MG/5ML syrup Take 5 mLs by mouth daily.     HYDROcodone-acetaminophen (NORCO) 5-325 MG tablet Take 1 tablet by mouth every 4 (four) hours as needed for moderate pain (kidney stone pain). 20 tablet 0   Insulin Glargine (BASAGLAR KWIKPEN ) Inject 34 Units into the skin in the morning.     Insulin Pen Needle 31G X 5 MM MISC 12 Units by Does not apply route 3 (three) times daily. 100 each 0   Ipratropium-Albuterol (COMBIVENT RESPIMAT) 20-100 MCG/ACT AERS respimat Inhale 1 puff into the lungs every 6 (six) hours as needed for wheezing or shortness of breath. Use 3 times daily x 5 days, then every 6 hours as needed. 4 g 1   Lancets (ONETOUCH DELICA PLUS LANCET33G) MISC Apply topically 3 (three) times  daily.     metFORMIN (GLUCOPHAGE) 500 MG tablet Take 500 mg by mouth 2 (two) times daily with a meal.     Misc Natural Products (GLUCOSAMINE CHONDROITIN TRIPLE) TABS Take 2 tablets by mouth daily.      Multiple Vitamins-Minerals (CENTRUM SILVER) CHEW Chew 2 tablets by mouth daily. soft chews     ONETOUCH VERIO test strip SMARTSIG:Via Meter 3-4 Times Daily PRN     pantoprazole (PROTONIX) 40 MG tablet Take 40 mg by mouth 2 (two) times daily.     Polyethyl Glycol-Propyl Glycol (SYSTANE) 0.4-0.3 % SOLN Place 1-2 drops into both eyes 3 (three) times daily as needed (dry/irritated eyes).     potassium chloride (KLOR-CON M10) 10 MEQ tablet TAKE 2 TABLETS BY MOUTH TWICE A DAY 360 tablet 2   psyllium (METAMUCIL SMOOTH TEXTURE) 28 % packet Take 1 packet by mouth in the morning.     Semaglutide,0.25 or 0.5MG /DOS, (OZEMPIC, 0.25 OR 0.5 MG/DOSE,) 2 MG/3ML SOPN 0.25mg  weekly x 4 weeks, then 0.5mg  weekly     simvastatin (ZOCOR) 10 MG tablet Take 1 tablet (10 mg total) by mouth at bedtime. 90 tablet 1   spironolactone (ALDACTONE) 25 MG tablet TAKE 1/2 TABLET (12.5MG  TOTAL) DAILY 45 tablet 2   triamcinolone cream (KENALOG) 0.1 % Apply 1 application topically 2 (two) times daily as needed (itching legs).      XARELTO 20 MG TABS tablet Take 1 tablet (20 mg total) by mouth daily. 90 tablet 1   dofetilide (TIKOSYN) 500 MCG capsule Take 1 capsule (500 mcg total) by mouth 2 (two) times daily. 180 capsule 2   No current facility-administered medications for this encounter.    Allergies  Allergen Reactions   Diphenhydramine Other (See Comments)    heart issues   No [employer] Food Allergies     Defibrillator pads cause rash.    Social History   Socioeconomic History   Marital status: Unknown    Spouse name: Not on file   Number of children: Not on file   Years of education: Not on file   Highest education level: Not on file  Occupational History   Not on file  Tobacco Use   Smoking status: Never    Smokeless tobacco: Never   Tobacco comments:    Never smoke 05/19/21  Vaping Use   Vaping Use: Never used  Substance and Sexual Activity   Alcohol  use: Not Currently   Drug use: No   Sexual activity: Never  Other Topics Concern   Not on file  Social History Narrative   ** Merged History Encounter **       Social Determinants of Health   Financial Resource Strain: Not on file  Food Insecurity: Not on file  Transportation Needs: Not on file  Physical Activity: Not on file  Stress: Not on file  Social Connections: Not on file  Intimate Partner Violence: Not on file     ROS- All systems are reviewed and negative except as per the HPI above.  Physical Exam: Vitals:   06/01/22 1354  BP: 116/68  Pulse: 67  Weight: 120.2 kg  Height:  (1.549 m)    GEN- The patient is a well appearing female, alert and oriented x 3 today.   HEENT-head normocephalic, atraumatic, sclera clear, conjunctiva pink, hearing intact, trachea midline. Lungs- Clear to ausculation bilaterally, normal work of breathing Heart- Regular rate and rhythm, no murmurs, rubs or gallops  GI- soft, NT, ND, + BS Extremities- no clubbing, cyanosis, or edema MS- no significant deformity or atrophy Skin- no rash or lesion Psych- euthymic mood, full affect Neuro- strength and sensation are intact   Wt Readings from Last 3 Encounters:  06/01/22 120.2 kg  12/01/21 124.2 kg  10/23/21 123.8 kg    EKG today demonstrates  SR, LAFB Vent. rate 67 BPM PR interval 166 ms QRS duration 114 ms QT/QTcB 466/492 ms  Echo 04/03/21 demonstrated  1. Left ventricular ejection fraction, by estimation, is 50 to 55%. The  left ventricle has low normal function. The left ventricle has no regional wall motion abnormalities. There is mild left ventricular hypertrophy. Left ventricular diastolic parameters are indeterminate.   2. Right ventricular systolic function is normal. The right ventricular  size is normal. Moderately  increased right ventricular wall thickness.  Tricuspid regurgitation signal is inadequate for assessing PA pressure.   3. A small pericardial effusion is present.   4. The mitral valve is normal in structure. No evidence of mitral valve regurgitation. No evidence of mitral stenosis.   5. The aortic valve was not well visualized. Aortic valve regurgitation  is trivial. Aortic valve sclerosis/calcification is present, without any  evidence of aortic stenosis.   6. The inferior vena cava is normal in size with greater than 50%  respiratory variability, suggesting right atrial pressure of 3 mmHg.   Epic records are reviewed at length today  CHA2DS2-VASc Score = 5  The patient's score is based upon: CHF History: 1 HTN History: 1 Diabetes History: 1 Stroke History: 0 Vascular Disease History: 0 Age Score: 1 Gender Score: 1        ASSESSMENT AND PLAN: 1. Persistent Atrial Fibrillation (ICD10:  I48.19) The patient's CHA2DS2-VASc score is 5, indicating a 7.2% annual risk of stroke.  S/p dofetilide admission 7/19-7/22/22 Patient maintaining SR with low afib burden.  Continue dofetilide 500 mcg BID. QT stable. Check bmet/mag today.  Continue Continue Xarelto 20 mg daily Continue Coreg 3.125 mg BID   2. Secondary Hypercoagulable State (ICD10:  D68.69) The patient is at significant risk for stroke/thromboembolism based upon her CHA2DS2-VASc Score of 5.  Continue Rivaroxaban (Xarelto).   3. Obesity Body mass index is 50.07 kg/m. Lifestyle modification was discussed and encouraged including regular physical activity and weight reduction. Patient now on Ozempic.   4. Obstructive sleep apnea Followed by Dr Mayford Knife. Encouraged compliance with CPAP therapy. Patient has had some issues  with mask leakage, she has an appointment   5. HTN Stable, no changes today.  6. Chronic diastolic CHF Enrolled in Alleviate HF Appears euvolemic today.  7. Hair loss/fatigue/heat intolerance  Check  thyroid panel today.   Follow up with Dr Mayford Knife as scheduled and in the AF clinic in 6 months.    Jorja Loa PA-C Afib Clinic [facility] [address] [place], [place] [address] [phone] 06/01/2022 2:31 PM

## 2022-06-02 LAB — T3: T3, Total: 101 ng/dL (ref 71–180)

## 2022-06-15 ENCOUNTER — Ambulatory Visit
Admission: RE | Admit: 2022-06-15 | Discharge: 2022-06-15 | Disposition: A | Discharge status: Home / Self Care | Payer: Medicare Other | Source: Ambulatory Visit | Attending: Gastroenterology | Admitting: Gastroenterology

## 2022-06-15 DIAGNOSIS — R1010 Upper abdominal pain, unspecified: Secondary | ICD-10-CM

## 2022-06-15 DIAGNOSIS — R1013 Epigastric pain: Secondary | ICD-10-CM | POA: Diagnosis not present

## 2022-06-15 DIAGNOSIS — I700 Atherosclerosis of aorta: Secondary | ICD-10-CM | POA: Diagnosis not present

## 2022-06-15 MED ORDER — IOPAMIDOL (ISOVUE-300) INJECTION 61%
100.0000 mL | Freq: Once | INTRAVENOUS | Status: AC | PRN
  Administered 2022-06-15: 100 mL via INTRAVENOUS

## 2022-06-18 ENCOUNTER — Ambulatory Visit: Payer: Medicare Other | Attending: Cardiology | Admitting: Cardiology

## 2022-06-18 ENCOUNTER — Encounter: Payer: Self-pay | Admitting: Cardiology

## 2022-06-18 VITALS — BP 116/68 | HR 71 | Ht 61.0 in | Wt 265.0 lb

## 2022-06-18 DIAGNOSIS — R0789 Other chest pain: Secondary | ICD-10-CM

## 2022-06-18 DIAGNOSIS — I4819 Other persistent atrial fibrillation: Secondary | ICD-10-CM | POA: Diagnosis not present

## 2022-06-18 DIAGNOSIS — I3139 Other pericardial effusion (noninflammatory): Secondary | ICD-10-CM | POA: Diagnosis not present

## 2022-06-18 DIAGNOSIS — I5032 Chronic diastolic (congestive) heart failure: Secondary | ICD-10-CM | POA: Insufficient documentation

## 2022-06-18 DIAGNOSIS — I10 Essential (primary) hypertension: Secondary | ICD-10-CM | POA: Insufficient documentation

## 2022-06-18 NOTE — Patient Instructions (Addendum)
Medication Instructions:  Your physician recommends that you continue on your current medications as directed. Please refer to the Current Medication list given to you today.  *If you need a refill on your cardiac medications before your next appointment, please call your pharmacy*   Lab Work: None.    Testing/Procedures: Your physician has requested that you have an echocardiogram. Echocardiography is a painless test that uses sound waves to create images of your heart. It provides your doctor with information about the size and shape of your heart and how well your heart's chambers and valves are working. This procedure takes approximately one hour. There are no restrictions for this procedure. Please do NOT wear cologne, perfume, aftershave, or lotions (deodorant is allowed). Please arrive 15 minutes prior to your appointment time.    Follow-Up:    To learn more about what you can do with MyChart, go to [email or web address].    Your next appointment:   1 year(s)  Provider:   Armanda Magic, MD     Other Instructions Dr. Mayford Knife has ordered new cpap supplies for you.  A member of our sleep team or from you DME company will reach out to coordinate delivery of these supplies to you.

## 2022-06-18 NOTE — Progress Notes (Signed)
Date:  06/18/2022   ID:  Karen Rasmussen, DOB 09/30/1945, MRN [id number]  PCP:  Sigmund Hazel, MD   [facility] Providers Cardiologist:  Armanda Magic, MD   Evaluation Performed:  Follow-Up Visit  Chief Complaint:  CHF, PAF, DCM, OSA  History of Present Illness:    Karen Rasmussen is a 77 y.o. female with history of normal coronary arteries with calcium score of 0 on coronary CTA in 09/2016, chronic diastolic CHF/non-ischemic cardiomyopathy with EF of 50-55%, paroxysmal atrial fibrillation s/p DCCV x2 on Xarelto, obstructive sleep apnea on CPAP, hypertension, diabetes mellitus, and morbid obesity.  She had a reoccurrence of her afib and was seen in afib clinic and her Betapace was switched to Tikosyn. She uses a Naval architect and has not had any further PAF.  At 1 point she was having sharp stabbing chest pain in her sternum that radiated across both sides of her chest and around to her back which would awaken her sometimes from sleep.  She also had a during the day.  There was no exertional component and no associated symptoms of shortness of breath nausea or diaphoresis with the pain.  It was not felt to be coronary ischemia as she had a normal coronary CTA in 2018 with a coronary calcium score of 0.  She does have a history of esophageal dilatation and there was some concern that it may be her esophagus.  She also was found to have a pericardial effusion on echo so the decision was made to treat her for possible pericarditis and she was started on colchicine 0.6 mg twice daily but she decided not to start it.   She is here today for followup and is doing well.  She denies any chest pain or pressure, SOB, DOE (except with extreme exertion related to sedentary state), PND, orthopnea, LE edema, dizziness, palpitations or syncope. She is compliant with her meds and is tolerating meds with no SE.    She is doing well with her PAP device and thinks that she has gotten used to it.  She tolerates  the nasal pillow mask and feels the pressure is adequate.  Since going on PAP she feels rested in the am and has no significant daytime sleepiness.  She denies any significant mouth or nasal dryness or nasal congestion.  She does not think that he snores.    Past Medical History:  Diagnosis Date   Abdominal pain, left lower quadrant    Asthmatic bronchitis    Benign essential HTN 04/26/2016   Bursitis of hip    Chronic diastolic CHF (congestive heart failure) (HCC)    DCM (dilated cardiomyopathy) (HCC)    EF 50-55% in Jan 2023   GERD (gastroesophageal reflux disease)    High cholesterol    History of kidney stones    Hypersomnia    Morbid obesity with BMI of 50.0-59.9, adult (HCC)    OSA on CPAP    Osteoarthritis    "right hip; both knees" (09/21/2016)   Persistent atrial fibrillation (HCC) 04/26/2016   Type II diabetes mellitus (HCC)    Vitamin D deficiency disease    Past Surgical History:  Procedure Laterality Date   ABDOMINAL HYSTERECTOMY     APPENDECTOMY     BALLOON DILATION N/A 11/17/2017   Procedure: BALLOON DILATION;  Surgeon: Kerin Salen, MD;  Location: [facility] ENDOSCOPY;  Service: Gastroenterology;  Laterality: N/A;   BIOPSY  11/17/2017   Procedure: BIOPSY;  Surgeon: Kerin Salen, MD;  Location: [facility] ENDOSCOPY;  Service: Gastroenterology;;   CARDIAC CATHETERIZATION     CARDIOVERSION N/A 06/11/2016   Procedure: CARDIOVERSION;  Surgeon: Lars Masson, MD;  Location: [facility] ENDOSCOPY;  Service: Cardiovascular;  Laterality: N/A;   CARDIOVERSION N/A 07/16/2016   Procedure: CARDIOVERSION;  Surgeon: Vesta Mixer, MD;  Location: [facility] ENDOSCOPY;  Service: Cardiovascular;  Laterality: N/A;   CARDIOVERSION N/A 09/23/2016   Procedure: CARDIOVERSION;  Surgeon: Thurmon Fair, MD;  Location: [facility] ENDOSCOPY;  Service: Cardiovascular;  Laterality: N/A;   CARDIOVERSION N/A 06/03/2020   Procedure: CARDIOVERSION;  Surgeon: Little Ishikawa, MD;  Location: [facility] ENDOSCOPY;  Service:  Cardiovascular;  Laterality: N/A;   CHOLECYSTECTOMY OPEN     COLONOSCOPY N/A 11/17/2017   Procedure: COLONOSCOPY;  Surgeon: Kerin Salen, MD;  Location: [facility] ENDOSCOPY;  Service: Gastroenterology;  Laterality: N/A;   cortisone injection to right knee     costisone injection to left knee  11/03/2017   CYSTOSCOPY WITH RETROGRADE PYELOGRAM, URETEROSCOPY AND STENT PLACEMENT Left 01/14/2019   Procedure: CYSTOSCOPy  STENT PLACEMENT;  Surgeon: Noel Christmas, MD;  Location: [facility] ORS;  Service: Urology;  Laterality: Left;   CYSTOSCOPY WITH RETROGRADE PYELOGRAM, URETEROSCOPY AND STENT PLACEMENT Left 03/06/2019   Procedure: CYSTOSCOPY WITH RETROGRADE PYELOGRAM, URETEROSCOPY AND STENT PLACEMENT;  Surgeon: Noel Christmas, MD;  Location: [facility]S;  Service: Urology;  Laterality: Left;  90 MINS   CYSTOSCOPY WITH STENT PLACEMENT Left 03/07/2019   Procedure: CYSTOSCOPY WITH , URETEROSCOPY/ STENT PLACEMENT;  Surgeon: Crist Fat, MD;  Location: [facility];  Service: Urology;  Laterality: Left;   DILATION AND CURETTAGE OF UTERUS     S/P miscarriage   ESOPHAGOGASTRODUODENOSCOPY N/A 11/17/2017   Procedure: ESOPHAGOGASTRODUODENOSCOPY (EGD);  Surgeon: Kerin Salen, MD;  Location: [facility]COPY;  Service: Gastroenterology;  Laterality: N/A;   HOLMIUM LASER APPLICATION Left 03/06/2019   Procedure: HOLMIUM LASER APPLICATION;  Surgeon: Noel Christmas, MD;  Location: [facility] ORS;  Service: Urology;  Laterality: Left;   implantable loop recorder placement  10/20/2020   Medtronic Reveal Linq model LNQ 11 (SN [id number]) implantable loop recorder implanted for Alleviate HF trial   NASAL SEPTUM SURGERY     POLYPECTOMY  11/17/2017   Procedure: POLYPECTOMY;  Surgeon: Kerin Salen, MD;  Location: [facility] ENDOSCOPY;  Service: Gastroenterology;;   TONSILLECTOMY       Current Meds  Medication Sig   acetaminophen (TYLENOL) 650 MG CR tablet Take 650 mg by mouth 2 (two) times daily as needed for pain.   alendronate (FOSAMAX) 70 MG  tablet Take 70 mg by mouth every Sunday. Take with a full glass of water on an empty stomach.   betamethasone dipropionate 0.05 % cream Apply 1 application topically 2 (two) times daily.   Biotin w/ Vitamins C & E (HAIR SKIN & NAILS GUMMIES PO) Take 2 tablets by mouth daily.    blood glucose meter kit and supplies Dispense based on patient and insurance preference. Use up to four times daily as directed. (FOR ICD-10 E10.9, E11.9).   Calcium-Vitamin D-Vitamin K (CALCIUM + D + K PO) Take 1 tablet by mouth in the morning.   carvedilol (COREG) 3.125 MG tablet Take 1 tablet (3.125 mg total) by mouth 2 (two) times daily with a meal.   dofetilide (TIKOSYN) 500 MCG capsule Take 1 capsule (500 mcg total) by mouth 2 (two) times daily.   fluocinolone (SYNALAR) 0.01 % external solution Apply 1 application topically 2 (two) times daily as needed (skin irritation).   folic acid (FOLVITE)  800 MCG tablet Take 800 mcg by mouth daily.   furosemide (LASIX) 40 MG tablet Take 1 tablet (40 MG) daily. As needed patient may take 40 MG additional Lasix PRN by mouth daily x 4 days as directed per Alleviate Research HF Study PRN plan.   gabapentin (NEURONTIN) 300 MG capsule Take 300 mg by mouth at bedtime.   HYDROcodone bit-homatropine (HYCODAN) 5-1.5 MG/5ML syrup Take 5 mLs by mouth daily.   HYDROcodone-acetaminophen (NORCO) 5-325 MG tablet Take 1 tablet by mouth every 4 (four) hours as needed for moderate pain (kidney stone pain).   Insulin Glargine (BASAGLAR KWIKPEN [place]) Inject 34 Units into the skin in the morning.   Insulin Pen Needle 31G X 5 MM MISC 12 Units by Does not apply route 3 (three) times daily.   Ipratropium-Albuterol (COMBIVENT RESPIMAT) 20-100 MCG/ACT AERS respimat Inhale 1 puff into the lungs every 6 (six) hours as needed for wheezing or shortness of breath. Use 3 times daily x 5 days, then every 6 hours as needed.   Lancets (ONETOUCH DELICA PLUS LANCET33G) MISC Apply topically 3 (three) times daily.    metFORMIN (GLUCOPHAGE) 500 MG tablet Take 500 mg by mouth 2 (two) times daily with a meal.   Misc Natural Products (GLUCOSAMINE CHONDROITIN TRIPLE) TABS Take 2 tablets by mouth daily.    Multiple Vitamins-Minerals (CENTRUM SILVER) CHEW Chew 2 tablets by mouth daily. soft chews   ONETOUCH VERIO test strip SMARTSIG:Via Meter 3-4 Times Daily PRN   pantoprazole (PROTONIX) 40 MG tablet Take 40 mg by mouth 2 (two) times daily.   Polyethyl Glycol-Propyl Glycol (SYSTANE) 0.4-0.3 % SOLN Place 1-2 drops into both eyes 3 (three) times daily as needed (dry/irritated eyes).   potassium chloride (KLOR-CON M10) 10 MEQ tablet TAKE 2 TABLETS BY MOUTH TWICE A DAY   psyllium (METAMUCIL SMOOTH TEXTURE) 28 % packet Take 1 packet by mouth in the morning.   Semaglutide,0.25 or 0.5MG /DOS, (OZEMPIC, 0.25 OR 0.5 MG/DOSE,) 2 MG/3ML SOPN Inject 1 mg into the skin once a week.   simvastatin (ZOCOR) 10 MG tablet Take 1 tablet (10 mg total) by mouth at bedtime.   spironolactone (ALDACTONE) 25 MG tablet TAKE 1/2 TABLET (12.5MG  TOTAL) DAILY   triamcinolone cream (KENALOG) 0.1 % Apply 1 application topically 2 (two) times daily as needed (itching legs).    XARELTO 20 MG TABS tablet Take 1 tablet (20 mg total) by mouth daily.     Allergies:   Diphenhydramine and No healthtouch food allergies   Social History   Tobacco Use   Smoking status: Never   Smokeless tobacco: Never   Tobacco comments:    Never smoke 05/19/21  Vaping Use   Vaping Use: Never used  Substance Use Topics   Alcohol use: Not Currently   Drug use: No     Family Hx: The patient's family history includes CAD in her mother; Cancer in her father; Diabetes in her brother and father; Heart failure in her father; Hypertension in her brother. There is no history of Breast cancer.  ROS:   Please see the history of present illness.     All other systems reviewed and are negative.   Prior CV studies:   The following studies were reviewed today:  PAP  download  Labs/Other Tests and Data Reviewed:    EKG:  NSR with LAFB and iLBBB  Recent Labs: 10/23/2021: BNP 17.7; Hemoglobin 13.3; Platelets 262 06/01/2022: BUN 13; Creatinine, Ser 0.91; Magnesium 2.4; Potassium 3.9; Sodium 139; TSH 2.379  Recent Lipid Panel No results found for: "CHOL", "TRIG", "HDL", "CHOLHDL", "LDLCALC", "LDLDIRECT"  Wt Readings from Last 3 Encounters:  06/18/22 265 lb (120.2 kg)  06/01/22 265 lb (120.2 kg)  12/01/21 273 lb 12.8 oz (124.2 kg)     Risk Assessment/Calculations:    CHA2DS2-VASc Score = 5  This indicates a 7.2% annual risk of stroke. The patient's score is based upon: CHF History: 1 HTN History: 1 Diabetes History: 1 Stroke History: 0 Vascular Disease History: 0 Age Score: 1 Gender Score: 1    Objective:    Vital Signs:  BP 116/68   Pulse 71   Ht 5\' 1"  (1.549 m)   Wt 265 lb (120.2 kg)   SpO2 95%   BMI 50.07 kg/m   GEN: Well nourished, well developed in no acute distress HEENT: Normal NECK: No JVD; No carotid bruits LYMPHATICS: No lymphadenopathy CARDIAC:RRR, no murmurs, rubs, gallops RESPIRATORY:  Clear to auscultation without rales, wheezing or rhonchi  ABDOMEN: Soft, non-tender, non-distended MUSCULOSKELETAL:  No edema; No deformity  SKIN: Warm and dry NEUROLOGIC:  Alert and oriented x 3 PSYCHIATRIC:  Normal affect  ASSESSMENT & PLAN:     Essential hypertension  -BP is at adequately controlled on exam -continue Prescription drug management with spironolactone 25 mg daily, carvedilol 3.125 mg twice daily with as needed refills   Pericardial effusion -small on echo a year ago -stable small effusion on echo 03/2021 -repeat echo  Chest pain  -atypical sharp and nonexertional -she had a normal coronary CTA in 2018 with coronary Ca score  0 and I do not think that her CP is related to coronary ischemia -she has a hx of esophageal dilatation and has felt that her chest pain has been GI in the past -She did not appear  pericardial effusion on echo at 1 point and we tried her on colchicine but she decided not to take it  Chronic LE edema/Chronic diastolic CHF -She appears euvolemic on exam today -Continue prescription drug managed with Lasix 40 mg daily and spironolactone 25 mg daily with as needed refills  PAF -She is maintaining normal sinus rhythm and denies any palpitations -Continue prescription drug management with Tikosyn 500 mcg twice daily, carvedilol 3.125 mg twice daily, Xarelto 20 mg daily with as needed refills -Denies any bleeding problems on DOAC -I have personally reviewed and interpreted outside labs performed by patient's PCP which showed serum creatinine 0.91 and potassium 3.9 and Mag 2.4 on 06/01/2022 -QTC on EKG 2023 and today . -Hbg 13.3 and 10/2021 and normal TSH on 05/2021 -followed in afib clinic  OSA - The patient is tolerating PAP therapy well without any problems. The PAP download performed by his DME was personally reviewed and interpreted by me today and showed an AHI of 0.9/hr on auto CPAP from 4 to 18 cm H2O with 83% compliance in using more than 4 hours nightly.  The patient has been using and benefiting from PAP use and will continue to benefit from therapy.      Medication Adjustments/Labs and hests Ordered: Current medicines are reviewed at length with the patient today.  Concerns regarding medicines are outlined above.   Tests Ordered: Orders Placed This Encounter  Procedures   EKG 12-Lead     Medication Changes: No orders of the defined types were placed in this encounter.    Follow Up:  In Person in 1 year  Signed, Armanda Magic, MD  06/18/2022 2:20 PM    [facility]  [facility]

## 2022-06-21 ENCOUNTER — Telehealth: Payer: Self-pay | Admitting: Cardiology

## 2022-06-21 ENCOUNTER — Telehealth: Payer: Self-pay | Admitting: *Deleted

## 2022-06-21 DIAGNOSIS — Z01812 Encounter for preprocedural laboratory examination: Secondary | ICD-10-CM

## 2022-06-21 DIAGNOSIS — I5189 Other ill-defined heart diseases: Secondary | ICD-10-CM

## 2022-06-21 DIAGNOSIS — I509 Heart failure, unspecified: Secondary | ICD-10-CM

## 2022-06-21 DIAGNOSIS — I5032 Chronic diastolic (congestive) heart failure: Secondary | ICD-10-CM

## 2022-06-21 DIAGNOSIS — G4733 Obstructive sleep apnea (adult) (pediatric): Secondary | ICD-10-CM

## 2022-06-21 NOTE — Telephone Encounter (Signed)
I have reviewed the chart and I do not see that Dr. Laurey Morale office has sent over a clearance request yet.

## 2022-06-21 NOTE — Telephone Encounter (Signed)
Pt would like a callback regarding recent CT results. Please advise

## 2022-06-21 NOTE — Telephone Encounter (Signed)
-----   Message from Luellen Pucker, RN sent at 06/18/2022  2:30 PM EDT ----- Regarding: CPap supplies Hello! Dr. Mayford Knife requests new cpap supplies for this patient.  Thanks, Alcario Drought

## 2022-06-21 NOTE — Telephone Encounter (Signed)
Order placed to [employer] via comm message.

## 2022-06-21 NOTE — Telephone Encounter (Signed)
Patient calling in, states she had a CT w/ Dr. Marca Ancona on 06/15/22. Patient states Dr. Marca Ancona is concerned about an atrial mass and has canceled her endoscopy with possible biopsy until Dr. Mayford Knife has reviewed the CT ABD/Pelvis results which are visible in Epic. Patient has Echo 07/21/22 and is wondering 1)if that will be the right test to further evaluate and 2) if it can be done sooner. Forwarded to Dr. Mayford Knife.

## 2022-06-22 NOTE — Telephone Encounter (Signed)
Per Dr. Mayford Knife the pt is going to need a Cardiac MRI, Dr. Norris Cross nurse will order and arrange test. I do not see that our office has received a clearance request yet from Dr. Laurey Morale office. We will need a clearance request to be faxed to our office from Dr. Laurey Morale office.

## 2022-06-23 ENCOUNTER — Telehealth: Payer: Self-pay | Admitting: Cardiology

## 2022-06-23 NOTE — Addendum Note (Signed)
Addended by: Luellen Pucker on: 06/23/2022 01:09 PM   Modules accepted: Orders

## 2022-06-23 NOTE — Telephone Encounter (Signed)
Patient states she will need her orders for Cardiac MRI increased to a STAT request as she has a stomach mass and her provider will not be able to do testing on that mass until after she has the Cardiac MRI.

## 2022-06-23 NOTE — Telephone Encounter (Signed)
Called patient to advise Dr. Mayford Knife recommends cardiac MRI to further evaluate atrial mass. Patient verbalizes understanding and agrees to plan. Orders placed, CBC scheduled.

## 2022-06-24 ENCOUNTER — Ambulatory Visit: Payer: Medicare Other | Attending: Cardiology

## 2022-06-24 ENCOUNTER — Ambulatory Visit: Payer: Medicare Other

## 2022-06-24 ENCOUNTER — Telehealth: Payer: Self-pay | Admitting: Cardiology

## 2022-06-24 ENCOUNTER — Telehealth: Payer: Self-pay

## 2022-06-24 DIAGNOSIS — Z01812 Encounter for preprocedural laboratory examination: Secondary | ICD-10-CM | POA: Diagnosis not present

## 2022-06-24 DIAGNOSIS — I5189 Other ill-defined heart diseases: Secondary | ICD-10-CM | POA: Diagnosis not present

## 2022-06-24 LAB — CBC WITH DIFFERENTIAL/PLATELET
Basophils Absolute: 0 10*3/uL (ref 0.0–0.2)
Basos: 0 %
EOS (ABSOLUTE): 0.1 10*3/uL (ref 0.0–0.4)
Eos: 1 %
Hematocrit: 39.5 % (ref 34.0–46.6)
Hemoglobin: 12.6 g/dL (ref 11.1–15.9)
Lymphocytes Absolute: 2.1 10*3/uL (ref 0.7–3.1)
Lymphs: 21 %
MCH: 27.1 pg (ref 26.6–33.0)
MCHC: 31.9 g/dL (ref 31.5–35.7)
MCV: 85 fL (ref 79–97)
Monocytes Absolute: 0.7 10*3/uL (ref 0.1–0.9)
Monocytes: 7 %
Neutrophils Absolute: 6.8 10*3/uL (ref 1.4–7.0)
Neutrophils: 71 %
Platelets: 257 10*3/uL (ref 150–450)
RBC: 4.65 x10E6/uL (ref 3.77–5.28)
RDW: 15.3 % (ref 11.7–15.4)
WBC: 9.8 10*3/uL (ref 3.4–10.8)

## 2022-06-24 NOTE — Telephone Encounter (Signed)
Order changed to stat

## 2022-06-24 NOTE — Telephone Encounter (Signed)
-----   Message from Quintella Reichert, MD sent at 06/24/2022  3:42 PM EDT ----- Please let patient know that labs were normal.  Continue current medical therapy.

## 2022-06-24 NOTE — Telephone Encounter (Signed)
Advised patient that CBC was normal and to continue current medical therapy.

## 2022-06-24 NOTE — Telephone Encounter (Signed)
Spoke to Dr. Mayford Knife about patient's request for "stat" cardiac MRI to assess possible atrial mass so she can be medically cleared to have stomach mass evaluated surgically. Advised patient that w/ Dr. Norris Cross permission I have reached out to cardiac MRI dept to request MRI as soon as possible. Also clarified that labs done on 06/01/22 did not include CBC which is needed for this particular test. Patient verbalizes understanding and agrees to proceed with labs today.

## 2022-06-24 NOTE — Telephone Encounter (Signed)
Patient stated she had lab work done on 4/23 and wants to know if she will still need to have labs done again.

## 2022-06-24 NOTE — Addendum Note (Signed)
Addended by: Luellen Pucker on: 06/24/2022 12:36 PM   Modules accepted: Orders

## 2022-06-25 NOTE — Telephone Encounter (Addendum)
Procedure has been cancelled per Dr. Marca Ancona until pt has further testing, see notes from Dr. Mayford Knife.   I will update all parties involved.

## 2022-06-28 ENCOUNTER — Telehealth (HOSPITAL_COMMUNITY): Payer: Self-pay | Admitting: *Deleted

## 2022-06-28 NOTE — Telephone Encounter (Signed)
Reaching out to patient to offer assistance regarding upcoming cardiac imaging study; pt verbalizes understanding of appt date/time, parking situation and where to check in, and verified current allergies; name and call back number provided for further questions should they arise  Larey Brick RN Navigator Cardiac Imaging [facility] [phone] office [phone] cell  Patient denies claustrophobia but does report a dental implant.

## 2022-06-29 ENCOUNTER — Ambulatory Visit (HOSPITAL_COMMUNITY)
Admission: RE | Admit: 2022-06-29 | Discharge: 2022-06-29 | Disposition: A | Discharge status: Home / Self Care | Payer: Medicare Other | Source: Ambulatory Visit | Attending: Cardiology | Admitting: Cardiology

## 2022-06-29 ENCOUNTER — Other Ambulatory Visit: Payer: Self-pay | Admitting: Cardiology

## 2022-06-29 DIAGNOSIS — I5189 Other ill-defined heart diseases: Secondary | ICD-10-CM | POA: Insufficient documentation

## 2022-06-29 MED ORDER — GADOBUTROL 1 MMOL/ML IV SOLN
10.0000 mL | Freq: Once | INTRAVENOUS | Status: AC | PRN
  Administered 2022-06-29: 10 mL via INTRAVENOUS

## 2022-07-02 ENCOUNTER — Telehealth: Payer: Self-pay | Admitting: Cardiology

## 2022-07-02 NOTE — Telephone Encounter (Signed)
Called patient to advise that cardiac MRI was reviewed and there does not appear to be an atrial mass. Advised patient to have Dr. Laurey Morale office resubmit cardiac clearance for endoscopy with biopsy. Patient verbalized understanding. Notes forwarded to Dr. Laurey Morale office.

## 2022-07-02 NOTE — Telephone Encounter (Signed)
Called patient and let her know MRI had been read and results have been forwarded to Dr. Mayford Knife so she can make her final recommendation for cardiac clearance. Patient verbalizes understanding.

## 2022-07-02 NOTE — Telephone Encounter (Signed)
Follow Up:       Patient said she had a Stat MRI  on Tuesday and she still have not received the results. She s very concerned and would like to hear something asap please.

## 2022-07-02 NOTE — Telephone Encounter (Signed)
Speak to the patient, she will like to know her MRI results. Advised pt MD has not reviewed the test results, the office will contact her with the MD recommendations. Patient stated she scheduled for esophageal manometer on 7/17 and will like those results to be completed prior to procedure. Will forward to MD and nurse.

## 2022-07-06 ENCOUNTER — Telehealth: Payer: Self-pay

## 2022-07-06 NOTE — Telephone Encounter (Signed)
   Pre-operative Risk Assessment    Patient Name: Karen Rasmussen  DOB: 06/08/1945 MRN: [id number]      Request for Surgical Clearance    Procedure:   endoscopy   Date of Surgery:  Clearance TBD                               3. What is the name of the Surgeon, the Surgeon's Group or Practice, phone and fax number?  Press F2 and list below :1}  Surgeon:  Dr. Marca Ancona Surgeon's Group or Practice Name:  [facility] Phone number:  [phone] Fax number:  [phone]  4. What type of clearance is requested?  Medical or Cardiac Clearance only?  Pharmacy Clearance Only (Request is to hold medication only)?  Or Both?  Press F2 and select the clearance requested.  If both are needed, select both from the drop down list.     :1}  Type of Clearance Requested:   - Medical  - Pharmacy:  Hold Clopidogrel (Plavix)    5. What type of anesthesia will be used?  Press F2 and select the anesthesia to be used for the procedure.  :1}  Type of Anesthesia:  Not Indicated (Propofol?)  6. Are there any other requests or questions from the surgeon?    :1}  Additional requests/questions:    [name]   07/06/2022, 9:26 AM

## 2022-07-06 NOTE — Telephone Encounter (Signed)
   Patient Name: Karen Rasmussen  DOB: 30-Aug-1945 MRN: [id number]  Primary Cardiologist: Armanda Magic, MD  Chart reviewed as part of pre-operative protocol coverage. Given past medical history and time since last visit, based on ACC/AHA guidelines, Karen Rasmussen is at acceptable risk for the planned procedure without further cardiovascular testing.  Patient underwent cardiac MRI with no evidence of atrial mass detected.   The patient was advised that if she develops new symptoms prior to surgery to contact our office to arrange for a follow-up visit, and she verbalized understanding.  Per office protocol, patient can hold Xarelto for 2 days prior to procedure.   I will route this recommendation to the requesting party via Epic fax function and remove from pre-op pool.  Please call with questions.  Napoleon Form, Leodis Rains, NP 07/06/2022, 11:05 AM

## 2022-07-06 NOTE — Telephone Encounter (Signed)
Pharmacy please advise on holding Xarelto prior to endoscopy procedure scheduled for TBD. Thank you.

## 2022-07-06 NOTE — Telephone Encounter (Signed)
Patient with diagnosis of afib on Xarelto for anticoagulation.    Procedure: Endoscopy Date of procedure: TBD   CHA2DS2-VASc Score = 5   This indicates a 7.2% annual risk of stroke. The patient's score is based upon: CHF History: 1 HTN History: 1 Diabetes History: 1 Stroke History: 0 Vascular Disease History: 0 Age Score: 1 Gender Score: 1     CrCl 66 mL/min Platelet count 257 K   Per office protocol, patient can hold Xarelto for 2 days prior to procedure.     **This guidance is not considered finalized until pre-operative APP has relayed final recommendations.**

## 2022-07-07 ENCOUNTER — Other Ambulatory Visit: Payer: Self-pay | Admitting: Gastroenterology

## 2022-07-09 ENCOUNTER — Encounter (HOSPITAL_COMMUNITY): Payer: Self-pay | Admitting: Gastroenterology

## 2022-07-12 NOTE — H&P (Signed)
History of Present Illness General:        77 year old female, last saw PA on 11/17/2020 for GERD, recommended to continue pantoprazole twice a day and take Carafate suspension 3 times a day for epigastric pain, patient noted to have esophageal dysmotility. Colonoscopy 11/2017: Removal of 2 tubular adenomas, biopsies negative for microscopic colitis, repeat in 5 years EGD 11/2017: Biopsies negative for H. pylori or intestinal metaplasia, reactive gastropathy Barium swallow 3/21: Marked esophageal dysmotility, small hiatal hernia CT abdomen and pelvis without contrast 01/2019: Obstructing calculus at left ureteropelvic junction, mild left hydronephrosis and renal edema, left nephrolithiasis, left colonic diverticulosis without diverticulitis She was in the hospital 2 years ago, with an episode that starts suddenly as a pain that starts in her epigastrium and radiates to her back, feels like a stab, lasts for a minute. Lately she has been having trouble with eating, she feels full and that starts spitting up food and foamy mix and then burps a lot. She takes pantoprazole 40 mg twice a day. She is not on carafate anymore, denies acid reflux or heartburn these days. She feels that she chokes a lot and finds herself grabbing a trash can and throwing up what she ate. She reports regular Bms, 1 in 1-2 days, denies blood in stool or black stools. She has a good appetite, she is a diabetic and takes ozempic for the past 1 year, she has lost 30 lbs in 1 year. She checks her Bs daily and watches what she eats. She avoids [place], [place] and greasy food as it upsets her stomach. She ate baked potatoes and coleslaw and shrimp and oysters and had greasy diarrhea thereafter but has been eating more fish and broccoli and being more mindful. She is enrolled in the [facility] (Alleviated heart study, she has a small chip in her heart).  Current Medications Taking Alendronate Sodium 70 MG Tablet TAKE  1 TABLET ONCE WEEKLY ON SUNDAYS , Notes to Pharmacist: Dava Najjar KwikPen(Insulin Glargine) 100 UNIT/ML Solution Pen-injector 34 units Subcutaneous once a day , Notes to Pharmacist: Hyacinth Meeker BD Pen Needle Nano 2nd Gen(Insulin Pen Needle) 32G X 4 MM Miscellaneous AS DIRECTED 3-4 TIMES A DAY , Notes to Pharmacist: Hyacinth Meeker Calcium 500-100 MG-UNIT Tablet Chewable 1 tablet with a meal Orally Once a day , Notes to Pharmacist: OTC Carvedilol 3.125 MG Tablet 1 tablet by mouth Twice a day , Notes to Pharmacist: TURNER-Cardiology CPAP every evening , Notes to Pharmacist: [facility]NER[facility]iology Dofetilide 500 MCG Capsule 1 capsule Orally Twice a day , Notes to Pharmacist: TURNER-Cardiology Fluocinolone Acetonide 0.01 % Solution 1 application Externally twice a day as needed , Notes to Pharmacist: Jordan-Derm Folic Acid 800 MCG Tablet 1 tablet Orally Once a day , Notes to Pharmacist: Hyacinth Meeker Gabapentin 300 MG Capsule TAKE 1 CAPSULE BY MOUTH AT BEDTIME , Notes to Pharmacist: Hyacinth Meeker Glucosamine 1500 Complex Capsule 2 capsule Orally daily , Notes to Pharmacist: OTC Hair Skin & Nails Gummies(Biotin w/ Vitamins C & E) 1250-7.5-7.5 MCG-MG-UNT Tablet Chewable 2 tablets by mouth once a day , Notes to Pharmacist: OTC HYDROcodone-Acetaminophen 5-325 MG Tablet 1 tablet as needed Orally at bedtime(refill date 02/28/2022) , Notes to Pharmacist: MILLER- takes at bedtime with gabapentin Hydrocortisone (Perianal) 1 % Cream 1 application Externally twice a day as needed , Notes to Pharmacist: Derm Klor-Con 10(Potassium Chloride ER) 10 MEQ Tablet Extended Release 2 tablet with food by mouth Twice a day with meals , Notes to Pharmacist: TURNER[facility] Lasix(Furosemide) 40 MG Tablet 1 tablet  Orally Once a day , Notes to Pharmacist: TURNER[facility]Cardiology Metamucil 48.57 % Powder as directed Orally once a day , Notes to Pharmacist: OTC metFORMIN HCl 500 MG Tablet 1 tablet with a meal Orally Once a day , Notes to Pharmacist:  Hyacinth Meeker Mometasone Furoate 0.1 % Cream 1 application to affected area Externally once a day if needed , Notes to Pharmacist: JORDAN-DERM Multivitamins Tablet 1 tablet Orally once a day , Notes to Pharmacist: OTC One Touch Verio Flex Glucometer . Glucometer For use when checking blood sugars Fingerstick As Directed , Notes to Pharmacist: Leory Plowman Plus Lancet33G(Lancets) - Miscellaneous Use to test blood sugar 3 times a day as directed , Notes to Pharmacist: Marijean Heath Verio(Glucose Blood) - Strip FOR USE WHEN CHECKING BLOOD GLUCOSE 3-4 TIMES A DAY FINGER STICK *E11.9* , Notes to Pharmacist: Hyacinth Meeker Ozempic (0.25 or 0.5 MG/DOSE)(Semaglutide(0.25 or 0.5MG /DOS)) 2 MG/3ML Solution Pen-injector 1 mg weekly Subcutaneous , Notes to Pharmacist: Hyacinth Meeker Pantoprazole Sodium 40 MG Tablet Delayed Release 1 tablet Orally Twice a day , Notes to Pharmacist: pt needs GI yearly f/u for further refills Saline Nasal Spray 0.65 % Solution 2 sprays in each nostril as needed Nasally as needed , Notes to Pharmacist: OTC Simvastatin 10 MG Tablet TAKE 1 TABLET BY MOUTH EVERY DAY IN THE EVENING , Notes to Pharmacist: Hyacinth Meeker Spironolactone 25 MG Tablet 0.5 tablet with food Orally Once a day , Notes to Pharmacist: TURNER-Cardiology Systane(Polyethyl Glycol-Propyl Glycol) 0.4-0.3 % Solution as directed Ophthalmic as needed , Notes to Pharmacist: OTC Triamcinolone Acetonide 0.5 % Cream 1 application to affected area Externally Twice a day AS NEEDED , Notes to Pharmacist: Jordan-Derm - uses for 2 weeks only during flare ups Tylenol 8 Hour Arthritis Pain(Acetaminophen ER) 650 MG Tablet Extended Release 1-2 tablets Orally twice a day , Notes to Pharmacist: OTC Vitamin D 5000 UNIT Capsule 1 tablet Orally Once a day , Notes to Pharmacist: OTC Xarelto(Rivaroxaban) 20 MG Tablet 1 tablet with food Orally Once a day , Notes to Pharmacist: TURNER-Cardiology Medication List reviewed and reconciled with the patient Past  Medical History      Oa knees.      Morbid obesity.      chronic SOB secondary to obesity, deconditioning and diastolic dysfunction, aortic sclerosis Dr. Mayford Knife.      Depression.      GERD.      Obstructive sleep apnea.      Vitamin D deficiency 8/10, remains deficient 1/11.      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 Nickola Major 9/12.      2 left sided kidney stones 4mm, 5mm, passed 1, ED x 2 9/12 Dr.Wrenn.      Colon 2/10 Hayes polyp.      Echo 11/13 Turner normal LVF, mild LVH, mod BSH, mild LAE, mild AVSC.      *PPHM* given handout on HTN 04/03/2012.      Abn mammo 11/14, needs further imaging left breast; Prob benign BIRADS-3, L diag in 6 mos 12/14.      DM 3/15 6.9 A1C.      DERM [place] 3/15 s/p removal BCC chest.      NUTRIITION 3/15, 4/15 .      High chol LDL 6/15 ; high TG, HDL 12/17 .      Alopecia wears wigs .      [facility]THO [place] [facility] 2/16 cervical DJD neck pain , pred and mobic .      No diabetic retinopathy 04/09/14  [facility]; same 4/17, 4/18 .      Colon 9/16 Hayes polyps, divertic, repeat 3 yrs .      Osteopenia 2011, needs repeat Dexa; 7/17 Osteoporosis -2.6; 8/19 -2.7 little worse on Fosamax 2017-2019, repeat 2 years .      Lives alone,one story house, 3 steps going up into it .      DERM Skin cancer removed, atypical moles removed too q 6 -12 mos exams .      Pain eval initial 01/27/18 B knees, B hips, R shoulder hydrocodone 5/325 prn, #90, 3-6 mos; 07/2019, 09/2019 f/u.      Admitted 01/13/2019 UTI -01/22/2019 pyelo, picc line, kidney stone .      [place] pt assistance program upstream. . .08/2019 .      Colon 2019 Yeilin Zweber, EGD too, colon due 2024 .      No aspirin Turner 11/2019.      Cataract surgery left and right eye 8/15 and 10/06/2020.      7-19- 7/22 went into AFIB.      Heart implant procedure 10/20/2020 at [facility] for 3 year study.      Dexa 11/2020 -2.6 osteoporosis stable stay on Fosamax, repeat in 2-3 years. ..      allergies; Pads used for  cardioversion. Surgical History       total abdominal hysterectomy for endometriosis 1992       cholecystectomy 1987       appendectomy 1987       colonoscopy 2000,2005,2010,10/2014       cystoscopy, left ureteral stent placement - Dr. Marlou Porch 02/2019       L and R cataract surgery 09/2020       ICM implanted 2022       cardioversion 2022 Family History Father: deceased 50 yrs, colon CA or polyps do not remember, CHF, HTN, DM, osteoarthritis, obesity, diagnosed with Coronary artery disease, CHF (congestive heart failure), Colon cancer, Hypertension, Diabetes Mother: deceased 73 yrs, colon CA, ? do not remember, CAD, pulmonary embolism, osteoarthritis, alcoholism, diagnosed with Colon cancer Brother 1: alive, CHF, DM, HTN, DVT,back surg, diagnosed with Diabetes, Hypertension, CHF (congestive heart failure) 1 son(s) . No Fx hx of Liver disease. Social History General:  Tobacco use      cigarettes:  Never smoked     Tobacco history last updated  05/12/2022     Vaping  No EXPOSURE TO PASSIVE SMOKE: no. Alcohol: no. Caffeine: no, sugar free gingerale and pepsi zero.. Recreational drug use: no, no. DIET: no. Exercise: no. Marital Status: single, Divorced. Children: 1child. 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 Tobacco Exposure: Passive smoking as a child. Allergies Benadryl: heart issues - Contraindication cardioversion pads: itchy red welps - Side Effects Hospitalization/Major Diagnostic Procedure heart catherization 2009 A-fib 06/2016/08/2016 hospital Afib 7/19-22/2022 11/2020 None in the past year 05/2022 Review of Systems GI PROCEDURE:         Pacemaker/ AICD no.  Artificial heart valves no.  MI/heart attack no.  Abnormal heart rhythm YES, Atrial Fibrillation.  Angina no.  CVA no.  Hypertension YES.  Hypotension no.  Asthma, COPD no.  Sleep apnea YES, using CPAP.  Seizure disorders no.  Artificial joints no.  Severe DJD no.  Diabetes YES, type II.   Significant headaches no.  Vertigo no.  Depression/anxiety no.  Abnormal bleeding no.  Kidney Disease no.  Liver disease no.  Chance of pregnancy no.  Blood transfusion no  Vital Signs Wt: 264.0, Wt change: -5.6  lbs, Ht: 62, BMI: 48.28, Temp: 97.3, Pulse sitting: 73, BP sitting: 139/81. Examination Gastroenterology::        GENERAL APPEARANCE: Well developed,obese, no active distress, pleasant.         SCLERA: anicteric.         CARDIOVASCULAR Normal RRR .         RESPIRATORY Breath sounds normal. Respiration even and unlabored.         ABDOMEN No masses palpated. Liver and spleen not palpated, normal. Bowel sounds normal, Abdomen not distended.         EXTREMITIES: No edema.         NEURO: alert, oriented to time, place and person, normal gait.         PSYCH: mood/affect normal.  Assessments 1. Esophageal dysmotility - K22.4 (Primary) 2. Upper abdominal pain - R10.10 Treatment 1. Esophageal dysmotility        IMAGING: Esophageal Manometry (Ordered for 05/12/2022) Notes: Recommend diagnostic esophageal manomtery to ascertain if patient will benefit from botox injection. Meanwhile, continue pantoprazole 40 mg twice a day.    2. Upper abdominal pain  CT abdomen pelvis showed asymmetric thickening of greater curvature of the stomach. Recommend diagnostic EGD

## 2022-07-12 NOTE — Anesthesia Preprocedure Evaluation (Signed)
Anesthesia Evaluation  Patient identified by MRN, date of birth, ID band Patient awake    Reviewed: Allergy & Precautions, NPO status , Patient's Chart, lab work & pertinent test results, reviewed documented beta blocker date and time   History of Anesthesia Complications Negative for: history of anesthetic complications  Airway Mallampati: IV  TM Distance: >3 FB Neck ROM: Full    Dental no notable dental hx.    Pulmonary asthma , sleep apnea    Pulmonary exam normal        Cardiovascular hypertension, Pt. on medications and Pt. on home beta blockers +CHF  Normal cardiovascular exam+ dysrhythmias Atrial Fibrillation   TTE 04/03/21: EF 50-55%, mild LVH, small pericardial effusion, valves ok   Neuro/Psych    Depression       GI/Hepatic Neg liver ROS,GERD  Medicated,,Gastric wall thickening   Endo/Other  diabetes, Type 2, Insulin Dependent, Oral Hypoglycemic Agents    Renal/GU negative Renal ROS     Musculoskeletal  (+) Arthritis ,    Abdominal   Peds  Hematology negative hematology ROS (+)   Anesthesia Other Findings Day of surgery medications reviewed with patient.  Reproductive/Obstetrics                              Anesthesia Physical Anesthesia Plan  ASA: 3  Anesthesia Plan: MAC   Post-op Pain Management: Minimal or no pain anticipated   Induction:   PONV Risk Score and Plan: Treatment may vary due to age or medical condition and Propofol infusion  Airway Management Planned: Natural Airway and Nasal Cannula  Additional Equipment: None  Intra-op Plan:   Post-operative Plan:   Informed Consent: I have reviewed the patients History and Physical, chart, labs and discussed the procedure including the risks, benefits and alternatives for the proposed anesthesia with the patient or authorized representative who has indicated his/her understanding and acceptance.       Plan  Discussed with: CRNA  Anesthesia Plan Comments:         Anesthesia Quick Evaluation

## 2022-07-13 ENCOUNTER — Ambulatory Visit (HOSPITAL_COMMUNITY)
Admission: RE | Admit: 2022-07-13 | Discharge: 2022-07-13 | Disposition: A | Discharge status: Home / Self Care | Payer: Medicare Other | Attending: Gastroenterology | Admitting: Gastroenterology

## 2022-07-13 ENCOUNTER — Ambulatory Visit (HOSPITAL_BASED_OUTPATIENT_CLINIC_OR_DEPARTMENT_OTHER): Payer: Medicare Other | Admitting: Anesthesiology

## 2022-07-13 ENCOUNTER — Ambulatory Visit (HOSPITAL_COMMUNITY): Payer: Medicare Other | Admitting: Anesthesiology

## 2022-07-13 ENCOUNTER — Encounter (HOSPITAL_COMMUNITY): Payer: Self-pay | Admitting: Gastroenterology

## 2022-07-13 ENCOUNTER — Other Ambulatory Visit: Payer: Self-pay

## 2022-07-13 ENCOUNTER — Encounter (HOSPITAL_COMMUNITY)
Admission: RE | Disposition: A | Discharge status: Home / Self Care | Payer: Self-pay | Source: Home / Self Care | Attending: Gastroenterology

## 2022-07-13 DIAGNOSIS — K224 Dyskinesia of esophagus: Secondary | ICD-10-CM | POA: Diagnosis not present

## 2022-07-13 DIAGNOSIS — E119 Type 2 diabetes mellitus without complications: Secondary | ICD-10-CM | POA: Insufficient documentation

## 2022-07-13 DIAGNOSIS — F32A Depression, unspecified: Secondary | ICD-10-CM | POA: Diagnosis not present

## 2022-07-13 DIAGNOSIS — G4733 Obstructive sleep apnea (adult) (pediatric): Secondary | ICD-10-CM | POA: Insufficient documentation

## 2022-07-13 DIAGNOSIS — J45909 Unspecified asthma, uncomplicated: Secondary | ICD-10-CM | POA: Diagnosis not present

## 2022-07-13 DIAGNOSIS — I509 Heart failure, unspecified: Secondary | ICD-10-CM | POA: Insufficient documentation

## 2022-07-13 DIAGNOSIS — R948 Abnormal results of function studies of other organs and systems: Secondary | ICD-10-CM | POA: Diagnosis not present

## 2022-07-13 DIAGNOSIS — Z7901 Long term (current) use of anticoagulants: Secondary | ICD-10-CM | POA: Diagnosis not present

## 2022-07-13 DIAGNOSIS — Z7984 Long term (current) use of oral hypoglycemic drugs: Secondary | ICD-10-CM

## 2022-07-13 DIAGNOSIS — I110 Hypertensive heart disease with heart failure: Secondary | ICD-10-CM

## 2022-07-13 DIAGNOSIS — R933 Abnormal findings on diagnostic imaging of other parts of digestive tract: Secondary | ICD-10-CM | POA: Insufficient documentation

## 2022-07-13 DIAGNOSIS — Z794 Long term (current) use of insulin: Secondary | ICD-10-CM

## 2022-07-13 DIAGNOSIS — K219 Gastro-esophageal reflux disease without esophagitis: Secondary | ICD-10-CM | POA: Diagnosis not present

## 2022-07-13 DIAGNOSIS — K449 Diaphragmatic hernia without obstruction or gangrene: Secondary | ICD-10-CM | POA: Insufficient documentation

## 2022-07-13 DIAGNOSIS — K317 Polyp of stomach and duodenum: Secondary | ICD-10-CM

## 2022-07-13 DIAGNOSIS — I4891 Unspecified atrial fibrillation: Secondary | ICD-10-CM | POA: Insufficient documentation

## 2022-07-13 DIAGNOSIS — R1310 Dysphagia, unspecified: Secondary | ICD-10-CM | POA: Diagnosis not present

## 2022-07-13 DIAGNOSIS — K3189 Other diseases of stomach and duodenum: Secondary | ICD-10-CM | POA: Diagnosis not present

## 2022-07-13 DIAGNOSIS — Z79899 Other long term (current) drug therapy: Secondary | ICD-10-CM | POA: Diagnosis not present

## 2022-07-13 DIAGNOSIS — K5730 Diverticulosis of large intestine without perforation or abscess without bleeding: Secondary | ICD-10-CM | POA: Insufficient documentation

## 2022-07-13 DIAGNOSIS — R1013 Epigastric pain: Secondary | ICD-10-CM | POA: Insufficient documentation

## 2022-07-13 DIAGNOSIS — G4730 Sleep apnea, unspecified: Secondary | ICD-10-CM | POA: Diagnosis not present

## 2022-07-13 HISTORY — PX: POLYPECTOMY: SHX5525

## 2022-07-13 HISTORY — PX: ESOPHAGOGASTRODUODENOSCOPY (EGD) WITH PROPOFOL: SHX5813

## 2022-07-13 HISTORY — PX: BOTOX INJECTION: SHX5754

## 2022-07-13 HISTORY — PX: BIOPSY: SHX5522

## 2022-07-13 LAB — GLUCOSE, CAPILLARY: Glucose-Capillary: 152 mg/dL — ABNORMAL HIGH (ref 70–99)

## 2022-07-13 SURGERY — ESOPHAGOGASTRODUODENOSCOPY (EGD) WITH PROPOFOL
Anesthesia: Monitor Anesthesia Care

## 2022-07-13 MED ORDER — LACTATED RINGERS IV SOLN: INTRAVENOUS | Status: DC

## 2022-07-13 MED ORDER — SODIUM CHLORIDE (PF) 0.9 % IJ SOLN
INTRAMUSCULAR | Status: AC
  Filled 2022-07-13: qty 10

## 2022-07-13 MED ORDER — PROPOFOL 500 MG/50ML IV EMUL
INTRAVENOUS | Status: DC | PRN
  Administered 2022-07-13: 100 ug/kg/min via INTRAVENOUS

## 2022-07-13 MED ORDER — LIDOCAINE HCL (CARDIAC) PF 100 MG/5ML IV SOSY
PREFILLED_SYRINGE | INTRAVENOUS | Status: DC | PRN
  Administered 2022-07-13: 60 mg via INTRAVENOUS

## 2022-07-13 MED ORDER — PROPOFOL 10 MG/ML IV BOLUS
INTRAVENOUS | Status: DC | PRN
  Administered 2022-07-13: 30 mg via INTRAVENOUS
  Administered 2022-07-13: 50 mg via INTRAVENOUS

## 2022-07-13 MED ORDER — PROPOFOL 1000 MG/100ML IV EMUL
INTRAVENOUS | Status: AC
  Filled 2022-07-13: qty 100

## 2022-07-13 MED ORDER — SODIUM CHLORIDE 0.9 % IV SOLN: INTRAVENOUS | Status: DC

## 2022-07-13 MED ORDER — SODIUM CHLORIDE (PF) 0.9 % IJ SOLN
INTRAMUSCULAR | Status: DC | PRN
  Administered 2022-07-13: 4 mL via SUBMUCOSAL

## 2022-07-13 MED ORDER — ONABOTULINUMTOXINA 100 UNITS IJ SOLR
INTRAMUSCULAR | Status: AC
  Filled 2022-07-13: qty 100

## 2022-07-13 SURGICAL SUPPLY — 15 items

## 2022-07-13 NOTE — Progress Notes (Addendum)
Pt presented to endoscopy today for EGD with MD Marca Ancona. Post-operatively, pt denied pain. Patient was taken to restroom in preparation for discharge. Upon returning from restroom, pt c/o sudden 7/10 upper abdominal pain radiating to the upper back which she characterizes as discomfort. She states that this pain is consistent with intermittent pain she experiences at home. MD Marca Ancona notified by phone. MD Marca Ancona instructed this RN to continue to monitor pt's pain. No new orders at this time.  Eulas Post, RN 07/13/22 11:35 AM  Addendum 1140: Pt endorses improvement in pain level. Location remains unchanged. Pt states she usually deals with pain by going home and resting. MD Marca Ancona notified, okay with d/c. Pt d/c'ed to home.

## 2022-07-13 NOTE — Anesthesia Postprocedure Evaluation (Signed)
Anesthesia Post Note  Patient: Karen Rasmussen  Procedure(s) Performed: ESOPHAGOGASTRODUODENOSCOPY (EGD) WITH PROPOFOL BIOPSY BOTOX INJECTION POLYPECTOMY     Patient location during evaluation: PACU Anesthesia Type: MAC Level of consciousness: awake and alert Pain management: pain level controlled Vital Signs Assessment: post-procedure vital signs reviewed and stable Respiratory status: spontaneous breathing, nonlabored ventilation and respiratory function stable Cardiovascular status: blood pressure returned to baseline Postop Assessment: no apparent nausea or vomiting Anesthetic complications: no   No notable events documented.  Last Vitals:  Vitals:   07/13/22 1050 07/13/22 1100  BP: (!) 121/38 (!) 139/57  Pulse: 70 66  Resp: 20 17  Temp:    SpO2: 97% 97%    Last Pain:  Vitals:   07/13/22 1138  TempSrc:   PainSc: 5                  Shanda Howells

## 2022-07-13 NOTE — Interval H&P Note (Signed)
History and Physical Interval Note: 76/female with abnormal CT(gastric greater curvature thickening), dysphagia, epigastric pain, diarrhea for EGD.  07/13/2022 10:14 AM  Karen Rasmussen  has presented today for EGD with possible balloon dilation and possible botox injection, with the diagnosis of Gastric wall thickening.  The various methods of treatment have been discussed with the patient and family. After consideration of risks, benefits and other options for treatment, the patient has consented to  Procedure(s): ESOPHAGOGASTRODUODENOSCOPY (EGD) WITH PROPOFOL (N/A) as a surgical intervention.  The patient's history has been reviewed, patient examined, no change in status, stable for surgery.  I have reviewed the patient's chart and labs.  Questions were answered to the patient's satisfaction.     Kerin Salen

## 2022-07-13 NOTE — Discharge Instructions (Addendum)
Resume your Xarelto tomorrow (07/14/2022)  YOU HAD AN ENDOSCOPIC PROCEDURE TODAY: Refer to the procedure report and other information in the discharge instructions given to you for any specific questions about what was found during the examination. If this information does not answer your questions, please call the [facility] office at [phone] to clarify.   YOU SHOULD EXPECT: Some feelings of bloating in the abdomen. Passage of more gas than usual. Walking can help get rid of the air that was put into your GI tract during the procedure and reduce the bloating.  DIET: Your first meal following the procedure should be a light meal and then it is ok to progress to your normal diet. A half-sandwich or bowl of soup is an example of a good first meal. Heavy or fried foods are harder to digest and may make you feel nauseous or bloated. Drink plenty of fluids but you should avoid alcoholic beverages for 24 hours.   ACTIVITY: Your care partner should take you home directly after the procedure. You should plan to take it easy, moving slowly for the rest of the day. You can resume normal activity the day after the procedure however YOU SHOULD NOT DRIVE, use power tools, machinery or perform tasks that involve climbing or major physical exertion for 24 hours (because of the sedation medicines used during the test).   SYMPTOMS TO REPORT IMMEDIATELY: A gastroenterologist can be reached at any hour. Please call [phone]  for any of the following symptoms:   Following upper endoscopy (EGD, EUS, ERCP, esophageal dilation) Vomiting of blood or coffee ground material  New, significant abdominal pain  New, significant chest pain or pain under the shoulder blades  Painful or persistently difficult swallowing  New shortness of breath  Black, tarry-looking or red, bloody stools  FOLLOW UP:  If any biopsies were taken you will be contacted by phone or by letter within the next 1-3 weeks. Call [phone]  if you  have not heard about the biopsies in 3 weeks.  Please also call with any specific questions about appointments or follow up tests.

## 2022-07-13 NOTE — Transfer of Care (Signed)
Immediate Anesthesia Transfer of Care Note  Patient: Karen Rasmussen  Procedure(s) Performed: ESOPHAGOGASTRODUODENOSCOPY (EGD) WITH PROPOFOL BIOPSY BOTOX INJECTION POLYPECTOMY  Patient Location: PACU  Anesthesia Type:MAC  Level of Consciousness: awake and patient cooperative  Airway & Oxygen Therapy: Patient Spontanous Breathing and Patient connected to face mask oxygen  Post-op Assessment: Report given to RN and Post -op Vital signs reviewed and stable  Post vital signs: Reviewed and stable  Last Vitals:  Vitals Value Taken Time  BP 144/79 07/13/22 1046  Temp    Pulse 66 07/13/22 1047  Resp 14 07/13/22 1047  SpO2 100 % 07/13/22 1047  Vitals shown include unvalidated device data.  Last Pain:  Vitals:   07/13/22 0959  TempSrc: Temporal  PainSc: 0-No pain         Complications: No notable events documented.

## 2022-07-13 NOTE — Op Note (Signed)
[facility] Patient Name: Karen Rasmussen Procedure Date: 07/13/2022 MRN: [id number] Attending MD: Kerin Salen , MD, [id number] Date of Birth: [birth year] CSN: [id number] Age: 77 Admit Type: Outpatient Procedure:                Upper GI endoscopy Indications:              Abnormal CT of the GI tract- thickening of greater                            curvature of stomach, Epigastric abdominal pain,                            Dysphagia, Esophageal dysmotility noted on prior                            barium swallow, Diarrhea Providers:                Kerin Salen, MD, Lorenza Evangelist, RN, Rozetta Nunnery, Technician Referring MD:             Katheren Shams Medicines:                See the Anesthesia note for documentation of the                            administered medications Complications:            No immediate complications. Estimated blood loss:                            Minimal. Estimated Blood Loss:     Estimated blood loss was minimal. Procedure:                Pre-Anesthesia Assessment:                           - Prior to the procedure, a History and Physical                            was performed, and patient medications and                            allergies were reviewed. The patient's tolerance of                            previous anesthesia was also reviewed. The risks                            and benefits of the procedure and the sedation                            options and risks were discussed with the patient.                            All  questions were answered, and informed consent                            was obtained. Prior Anticoagulants: The patient has                            taken Xarelto (rivaroxaban), last dose was 3 days                            prior to procedure. ASA Grade Assessment: III - A                            patient with severe systemic disease. After                             reviewing the risks and benefits, the patient was                            deemed in satisfactory condition to undergo the                            procedure.                           After obtaining informed consent, the endoscope was                            passed under direct vision. Throughout the                            procedure, the patient's blood pressure, pulse, and                            oxygen saturations were monitored continuously. The                            [id number]-H190 ([id number] Olympus endoscope was introduced                            through the mouth, and advanced to the second part                            of duodenum. The upper GI endoscopy was                            accomplished without difficulty. The patient                            tolerated the procedure well. Scope In: Scope Out: Findings:      No endoscopic abnormality was evident in the esophagus to explain the       patient's complaint of dysphagia. Biopsies were obtained from the       proximal and distal esophagus with cold forceps for histology of       suspected  eosinophilic esophagitis (Jar 5).      An area, 1 cm above GE junction at 40 cm, was successfully injected with       100 units botulinum toxin, 25 units/ml, 1 ml each in a four quadrant       fashion.      Multiple large pedunculated and sessile polyps with no bleeding and no       stigmata of recent bleeding were found in the cardia, in the gastric       fundus and in the gastric body. Biopsies were taken with a cold forceps       for histology (Jar 4).      Normal mucosa was found on the greater curvature of the stomach.       Biopsies were taken with a cold forceps for histology (Jar 3).      Patchy moderately erythematous mucosa without bleeding was found in the       gastric body and in the gastric antrum. Biopsies were taken with a cold       forceps for Helicobacter pylori testing (Jar 2).      The examined  duodenum was normal. Biopsies for histology were taken with       a cold forceps for evaluation of celiac disease(Jar 1). Impression:               - No endoscopic esophageal abnormality to explain                            patient's dysphagia. Injected with botulinum toxin.                           - Multiple gastric polyps. Biopsied.                           - Normal mucosa was found in the greater curvature.                            Biopsied.                           - Erythematous mucosa in the gastric body and                            antrum. Biopsied.                           - Normal examined duodenum. Biopsied.                           - Biopsies were taken with a cold forceps for                            evaluation of eosinophilic esophagitis. Moderate Sedation:      Patient did not receive moderate sedation for this procedure, but       instead received monitored anesthesia care. Recommendation:           - Patient has a contact number available for  emergencies. The signs and symptoms of potential                            delayed complications were discussed with the                            patient. Return to normal activities tomorrow.                            Written discharge instructions were provided to the                            patient.                           - Resume regular diet.                           - Continue present medications.                           - Await pathology results.                           - Resume Xarelto (rivaroxaban) at prior dose                            tomorrow. Procedure Code(s):        --- Professional ---                           [phone], Esophagogastroduodenoscopy, flexible,                            transoral; with biopsy, single or multiple                           43236, 59, Esophagogastroduodenoscopy, flexible,                            transoral; with directed submucosal  injection(s),                            any substance Diagnosis Code(s):        --- Professional ---                           R13.10, Dysphagia, unspecified                           K31.7, Polyp of stomach and duodenum                           K31.89, Other diseases of stomach and duodenum                           R10.13, Epigastric pain  R93.3, Abnormal findings on diagnostic imaging of                            other parts of digestive tract CPT copyright 2022 [employer]. All rights reserved. The codes documented in this report are preliminary and upon coder review may  be revised to meet current compliance requirements. Kerin Salen, MD 07/13/2022 10:46:01 AM This report has been signed electronically. Number of Addenda: 0

## 2022-07-14 ENCOUNTER — Other Ambulatory Visit: Payer: Self-pay | Admitting: Cardiology

## 2022-07-14 LAB — SURGICAL PATHOLOGY

## 2022-07-18 ENCOUNTER — Encounter (HOSPITAL_COMMUNITY): Payer: Self-pay | Admitting: Gastroenterology

## 2022-07-21 ENCOUNTER — Ambulatory Visit (HOSPITAL_COMMUNITY): Payer: Medicare Other | Attending: Cardiology

## 2022-07-21 DIAGNOSIS — I3139 Other pericardial effusion (noninflammatory): Secondary | ICD-10-CM

## 2022-07-21 LAB — ECHOCARDIOGRAM LIMITED: Area-P 1/2: 4.06 cm2

## 2022-07-22 ENCOUNTER — Telehealth: Payer: Self-pay | Admitting: Cardiology

## 2022-07-22 DIAGNOSIS — I3139 Other pericardial effusion (noninflammatory): Secondary | ICD-10-CM

## 2022-07-22 NOTE — Telephone Encounter (Signed)
Pt called back regarding message received in [facility] Triage.   Pt shared Echo results per Dr Mayford Knife:    Echo showed normal heart function with small pericardial effusion. No change from prior study. To be complete would like to work up pericardial effusion.  Please get a CRP, ESR, ANA, RF, needs a mammogram.  At last OV no complaints of chest pain so will not treat with NSAIDs or colchicine at this time unless she gets CP   Pt understood report;  Pt asked for lab draw to be scheduled on Monday 07/26/2022 at 100 pm.    Pt wanted Dr. Mayford Knife to know she is still having breast bone radiating to shoulder blade pain, intermittently.  When pain occurs it is a 10 out of 10.  Pt advised that I will share with Dr. Mayford Knife, and Alcario Drought RN.    Pt lab orders entered, and concern communicated to Dr. Mayford Knife.  Pt stated she will schedule her own mammogram.

## 2022-07-22 NOTE — Telephone Encounter (Signed)
Patient calling to see what the lab work is for. Please advise

## 2022-07-22 NOTE — Telephone Encounter (Signed)
Pt is requesting a callback regarding her ECHO results. Please advise.

## 2022-07-22 NOTE — Telephone Encounter (Signed)
Returned call to patient and discussed with her what labs Dr. Mayford Knife was wanting drawn and the reason for drawing them. These labs will help Dr. 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 Also looking at inflammation within the body.  Patient shared that she has intermittent pain in breast bone, under bra line that radiates to her back and shoulder. She describes it as a sharp, stabbing sensation that will last about 10 minutes. She states it happens randomly (once a month, once a week, 2 days apart). She does not note any precipitating activity to the pain.  Shared Dr. Norris Cross response to patient continuing to have intermittent radiating breast bone/shoulder pain: Lets go ahead and try Ibuprofen 600mg  TID x 2 weeks and also start Colchicine 0.6mg  BID.  Followup with PA in 4 weeks    Patient is unsure if she should take colchicine with her current medications. Interaction noted between carvedilol and colchicine (carvedilol may increase blood levels of colchicine to dangerous levels).  Patient states she will wait to hear what Dr. Mayford Knife says about her lab results before starting any medications.  Follow-up visit scheduled with Jari Favre, PA-C on 08/23/22.  Patient verbalized understanding and expressed appreciation for call.

## 2022-07-23 MED ORDER — IBUPROFEN 600 MG PO TABS: 600.0000 mg | ORAL_TABLET | Freq: Three times a day (TID) | ORAL | 0 refills | Status: DC

## 2022-07-23 MED ORDER — COLCHICINE 0.6 MG PO TABS: 0.3000 mg | ORAL_TABLET | Freq: Every day | ORAL | 0 refills | Status: DC

## 2022-07-23 NOTE — Telephone Encounter (Signed)
Dose reduction due to the interaction is 0.3mg  of colchicine daily. I called and reviewed with patient the interaction and the manufacture dose reduction recommendations. Patient agreeable to take. Will call in rx and Rx for IBU to pharmacy. I did explain that there is an increased risk of bleeding with IBU and her [name], but this is the best means of treatment. Advised that after 1 week if she was feeling better, she could decrease dose to 300mg  TID as the recommended dosing in 600-800 TID for 1-2 weeks then decrease by 200mg .

## 2022-07-26 ENCOUNTER — Ambulatory Visit: Payer: Medicare Other | Attending: Cardiology

## 2022-07-26 DIAGNOSIS — I3139 Other pericardial effusion (noninflammatory): Secondary | ICD-10-CM | POA: Diagnosis not present

## 2022-07-27 ENCOUNTER — Other Ambulatory Visit: Payer: Self-pay

## 2022-07-27 DIAGNOSIS — Z79899 Other long term (current) drug therapy: Secondary | ICD-10-CM

## 2022-07-27 LAB — ANA: Anti Nuclear Antibody (ANA): NEGATIVE

## 2022-07-27 LAB — C-REACTIVE PROTEIN: CRP: 18 mg/L — ABNORMAL HIGH (ref 0–10)

## 2022-07-27 LAB — RHEUMATOID FACTOR: Rheumatoid fact SerPl-aCnc: 19.2 IU/mL — ABNORMAL HIGH (ref <14.0)

## 2022-07-27 LAB — SEDIMENTATION RATE: Sed Rate: 19 mm/hr (ref 0–40)

## 2022-08-04 DIAGNOSIS — E119 Type 2 diabetes mellitus without complications: Secondary | ICD-10-CM | POA: Diagnosis not present

## 2022-08-10 ENCOUNTER — Other Ambulatory Visit: Payer: Self-pay | Admitting: Family Medicine

## 2022-08-10 DIAGNOSIS — M810 Age-related osteoporosis without current pathological fracture: Secondary | ICD-10-CM

## 2022-08-20 ENCOUNTER — Other Ambulatory Visit (HOSPITAL_COMMUNITY): Payer: Self-pay | Admitting: *Deleted

## 2022-08-20 MED ORDER — DOFETILIDE 500 MCG PO CAPS: 500.0000 ug | ORAL_CAPSULE | Freq: Two times a day (BID) | ORAL | 2 refills | Status: DC

## 2022-08-23 ENCOUNTER — Encounter: Payer: Self-pay | Admitting: Physician Assistant

## 2022-08-23 ENCOUNTER — Ambulatory Visit: Payer: Medicare Other | Attending: Physician Assistant | Admitting: Physician Assistant

## 2022-08-23 VITALS — BP 122/60 | HR 71 | Ht 61.5 in | Wt 268.0 lb

## 2022-08-23 DIAGNOSIS — I509 Heart failure, unspecified: Secondary | ICD-10-CM | POA: Insufficient documentation

## 2022-08-23 DIAGNOSIS — G4733 Obstructive sleep apnea (adult) (pediatric): Secondary | ICD-10-CM | POA: Diagnosis not present

## 2022-08-23 DIAGNOSIS — R0789 Other chest pain: Secondary | ICD-10-CM | POA: Insufficient documentation

## 2022-08-23 DIAGNOSIS — I4819 Other persistent atrial fibrillation: Secondary | ICD-10-CM | POA: Diagnosis not present

## 2022-08-23 DIAGNOSIS — Z79899 Other long term (current) drug therapy: Secondary | ICD-10-CM | POA: Insufficient documentation

## 2022-08-23 DIAGNOSIS — I3139 Other pericardial effusion (noninflammatory): Secondary | ICD-10-CM | POA: Diagnosis present

## 2022-08-23 DIAGNOSIS — I10 Essential (primary) hypertension: Secondary | ICD-10-CM | POA: Insufficient documentation

## 2022-08-23 DIAGNOSIS — I5032 Chronic diastolic (congestive) heart failure: Secondary | ICD-10-CM | POA: Insufficient documentation

## 2022-08-23 MED ORDER — XARELTO 20 MG PO TABS
20.0000 mg | ORAL_TABLET | Freq: Every day | ORAL | 2 refills | Status: DC
Start: 2022-08-23 — End: 2022-09-06

## 2022-08-23 NOTE — Progress Notes (Signed)
Cardiology Office Note:  .   Date:  08/23/2022  ID:  Karen Rasmussen, DOB 1945/04/22, MRN [id number] PCP: Karen Hazel, MD  [facility] Providers Cardiologist:  Armanda Magic, MD {  History of Present Illness: .   Karen Rasmussen is a 77 y.o. female with a past medical history of normal coronary arteries with calcium score of 0 on coronary CTA 09/2016, chronic diastolic CHF/nonischemic cardiomyopathy with EF 50 to 55%, paroxysmal atrial fibrillation status post DCCV x 2 on Xarelto, obstructive sleep apnea on CPAP, HTN, diabetes mellitus, and morbid obesity.  Presents for follow-up today.  Had recurrence of atrial fibrillation and was seen in the A-fib clinic and her Betapace was switched to Tikosyn.  Uses a cardia mobile device and has not had any further PAF.  Was seen last 06/17/2021 by Dr. Mayford Knife.  History includes at 1 point having stabbing chest pain in her sternum that radiated across both sides of her chest and around to her back which would awaken her sometimes from sleep.  She also had it during the day.  There were no exertional symptoms or associated shortness of breath, nausea, or diaphoresis with the pain.  Not felt to be coronary ischemia and she had a normal coronary CTA in 2018 with a coronary calcium score of 0.  She does have a history of esophageal dilation and there was some concern that it might be her esophagus causing this pain.  Found to have a pericardial effusion on echo so the decision was made to treat her for possible parotitis and she was started on colchicine 0.6 mg twice daily but she decided not to start it.  At her last visit she was doing well.  No chest pain/pressure, SOB, DOE (except with extreme exertion), PND, orthopnea, lower extremity edema, dizziness, palpitations, syncope.  Compliant with medications.  Doing well with her PAP device at that time.  Today, she tells  me that in the past she had a stabbing pain that would go around to her back but she has not  had any issues with that lately.  She states that she is mostly just tired this visit.  She has had issues with her esophagus and has had it stretched in the past.  She also recently found out she has polyps in her stomach and is set up for an esophageal mammography.  Elastic therapy has been recommended for compression hose.  Small amount of lower extremity edema present today.  She is struggling with her nasal pillow and only gets maybe 4 hours of use.  I told her I would get in touch with Coralee North to problem shoot.  Reports no shortness of breath nor dyspnea on exertion. Reports no chest pain, pressure, or tightness. No edema, orthopnea, PND. Reports no palpitations.   ROS: Pertinent ROS in HPI  Studies Reviewed: .       Echocardiogram 07/21/2022 IMPRESSIONS     1. Left ventricular ejection fraction, by estimation, is 55 to 60%. The  left ventricle has normal function. The left ventricle has no regional  wall motion abnormalities.   2. Right ventricular systolic function is normal. The right ventricular  size is normal.   3. A small pericardial effusion is present.   4. The mitral valve is normal in structure. No evidence of mitral valve  regurgitation. No evidence of mitral stenosis.   5. The aortic valve is normal in structure. Aortic valve regurgitation is  trivial. No aortic stenosis is present.  6. The inferior vena cava is normal in size with greater than 50%  respiratory variability, suggesting right atrial pressure of 3 mmHg.   FINDINGS   Left Ventricle: Left ventricular ejection fraction, by estimation, is 55  to 60%. The left ventricle has normal function. The left ventricle has no  regional wall motion abnormalities. The left ventricular internal cavity  size was normal in size. There is   no left ventricular hypertrophy.   Right Ventricle: The right ventricular size is normal. No increase in  right ventricular wall thickness. Right ventricular systolic function is   normal.   Left Atrium: Left atrial size was normal in size.   Right Atrium: Right atrial size was normal in size.   Pericardium: A small pericardial effusion is present. The pericardial  effusion appears to contain fibrous material.   Mitral Valve: The mitral valve is normal in structure. No evidence of  mitral valve stenosis.   Tricuspid Valve: The tricuspid valve is normal in structure. Tricuspid  valve regurgitation is not demonstrated. No evidence of tricuspid  stenosis.   Aortic Valve: The aortic valve is normal in structure. Aortic valve  regurgitation is trivial. No aortic stenosis is present.   Pulmonic Valve: The pulmonic valve was normal in structure. Pulmonic valve  regurgitation is not visualized. No evidence of pulmonic stenosis.   Aorta: The aortic root is normal in size and structure.   Venous: The inferior vena cava is normal in size with greater than 50%  respiratory variability, suggesting right atrial pressure of 3 mmHg.   IAS/Shunts: No atrial level shunt detected by color flow Doppler.       Physical Exam:   VS:  There were no vitals taken for this visit.   Wt Readings from Last 3 Encounters:  07/13/22 260 lb (117.9 kg)  06/18/22 265 lb (120.2 kg)  06/01/22 265 lb (120.2 kg)    GEN: Well nourished, well developed in no acute distress NECK: No JVD; No carotid bruits CARDIAC: RRR, no murmurs, rubs, gallops RESPIRATORY:  Clear to auscultation without rales, wheezing or rhonchi  ABDOMEN: Soft, non-tender, non-distended EXTREMITIES:  No edema; No deformity   ASSESSMENT AND PLAN: .   1.  Essential hypertension -Blood pressure well-controlled today -Continue current medication regimen which includes Coreg 3.125 mg twice daily, Tikosyn 500 mg twice a day, Lasix 40 mg daily with an extra 40 as needed, Xarelto 20 mg daily, simvastatin 10 mg daily, spironolactone 25 mg daily  2.  Pericardial effusion -Most recent echocardiogram reviewed with only small  pericardial effusion -Continue colchicine 0.3 mg daily and ibuprofen 600 mg 3 times a day for assumed pericarditis  3.  Chest pain -that has resolved -Continue current medication regimen -Continue heart healthy, low-sodium diet  4.  Chronic lower extremity edema/chronic diastolic CHF -Encouraged use of compression stockings, elastic therapy -Continue current diuretic regimen -Encouraged elevation of feet when able  5.  PAF Continue medications, carvedilol 3.125 mg twice daily and Xarelto 20 mg daily  6.  OSA -sometimes she gets 4 hours only. Nasal pillow, bad seal.  -She was using small for years and now medium.      Dispo: You can follow-up in 6 months with Dr. Mayford Knife  Signed, Sharlene Dory, PA-C

## 2022-08-23 NOTE — Patient Instructions (Addendum)
Medication Instructions:   Your physician recommends that you continue on your current medications as directed. Please refer to the Current Medication list given to you today.   *If you need a refill on your cardiac medications before your next appointment, please call your pharmacy*   Lab Work: NONE ORDERED  TODAY    If you have labs (blood work) drawn today and your tests are completely normal, you will receive your results only by: MyChart Message (if you have MyChart) OR A paper copy in the mail If you have any lab test that is abnormal or we need to change your treatment, we will call you to review the results.     Follow-Up: At [facility], you and your health needs are our priority.  As part of our continuing mission to provide you with exceptional heart care, we have created designated Provider Care Teams.  These Care Teams include your primary Cardiologist (physician) and Advanced Practice Providers (APPs -  Physician Assistants and Nurse Practitioners) who all work together to provide you with the care you need, when you need it.  We recommend signing up for the patient portal called "MyChart".  Sign up information is provided on this After Visit Summary.  MyChart is used to connect with patients for Virtual Visits (Telemedicine).  Patients are able to view lab/test results, encounter notes, upcoming appointments, etc.  Non-urgent messages can be sent to your provider as well.   To learn more about what you can do with MyChart, go to [email or web address].    Your next appointment:   6 month(s)  Provider:   Armanda Magic, MD     Other Instructions  Low-Sodium Eating Plan Salt (sodium) helps you keep a healthy balance of fluids in your body. Too much sodium can raise your blood pressure. It can also cause fluid and waste to be held in your body. Your health care provider or dietitian may recommend a low-sodium eating plan if you have high blood pressure  (hypertension), kidney disease, liver disease, or heart failure. Eating less sodium can help lower your blood pressure and reduce swelling. It can also protect your heart, liver, and kidneys. What are tips for following this plan? Reading food labels  Check food labels for the amount of sodium per serving. If you eat more than one serving, you must multiply the listed amount by the number of servings. Choose foods with less than 140 milligrams (mg) of sodium per serving. Avoid foods with 300 mg of sodium or more per serving. Always check how much sodium is in a product, even if the label says "unsalted" or "no salt added." Shopping  Buy products labeled as "low-sodium" or "no salt added." Buy fresh foods. Avoid canned foods and pre-made or frozen meals. Avoid canned, cured, or processed meats. Buy breads that have less than 80 mg of sodium per slice. Cooking  Eat more home-cooked food. Try to eat less restaurant, buffet, and fast food. Try not to add salt when you cook. Use salt-free seasonings or herbs instead of table salt or sea salt. Check with your provider or pharmacist before using salt substitutes. Cook with plant-based oils, such as canola, sunflower, or olive oil. Meal planning When eating at a restaurant, ask if your food can be made with less salt or no salt. Avoid dishes labeled as brined, pickled, cured, or smoked. Avoid dishes made with soy sauce, miso, or teriyaki sauce. Avoid foods that have monosodium glutamate (MSG) in them. MSG may be  added to some restaurant food, sauces, soups, bouillon, and canned foods. Make meals that can be grilled, baked, poached, roasted, or steamed. These are often made with less sodium. General information Try to limit your sodium intake to 1,500-2,300 mg each day, or the amount told by your provider. What foods should I eat? Fruits Fresh, frozen, or canned fruit. Fruit juice. Vegetables Fresh or frozen vegetables. "No salt added" canned  vegetables. "No salt added" tomato sauce and paste. Low-sodium or reduced-sodium tomato and vegetable juice. Grains Low-sodium cereals, such as oats, puffed wheat and rice, and shredded wheat. Low-sodium crackers. Unsalted rice. Unsalted pasta. Low-sodium bread. Whole grain breads and whole grain pasta. Meats and other proteins Fresh or frozen meat, poultry, seafood, and fish. These should have no added salt. Low-sodium canned tuna and salmon. Unsalted nuts. Dried peas, beans, and lentils without added salt. Unsalted canned beans. Eggs. Unsalted nut butters. Dairy Milk. Soy milk. Cheese that is naturally low in sodium, such as ricotta cheese, fresh mozzarella, or Swiss cheese. Low-sodium or reduced-sodium cheese. Cream cheese. Yogurt. Seasonings and condiments Fresh and dried herbs and spices. Salt-free seasonings. Low-sodium mustard and ketchup. Sodium-free salad dressing. Sodium-free light mayonnaise. Fresh or refrigerated horseradish. Lemon juice. Vinegar. Other foods Homemade, reduced-sodium, or low-sodium soups. Unsalted popcorn and pretzels. Low-salt or salt-free chips. The items listed above may not be all the foods and drinks you can have. Talk to a dietitian to learn more. What foods should I avoid? Vegetables Sauerkraut, pickled vegetables, and relishes. Olives. [place] fries. Onion rings. Regular canned vegetables, except low-sodium or reduced-sodium items. Regular canned tomato sauce and paste. Regular tomato and vegetable juice. Frozen vegetables in sauces. Grains Instant hot cereals. Bread stuffing, pancake, and biscuit mixes. Croutons. Seasoned rice or pasta mixes. Noodle soup cups. Boxed or frozen macaroni and cheese. Regular salted crackers. Self-rising flour. Meats and other proteins Meat or fish that is salted, canned, smoked, spiced, or pickled. Precooked or cured meat, such as sausages or meat loaves. Tomasa Blase. Ham. Pepperoni. Hot dogs. Corned beef. Chipped beef. Salt pork. Jerky.  Pickled herring, anchovies, and sardines. Regular canned tuna. Salted nuts. Dairy Processed cheese and cheese spreads. Hard cheeses. Cheese curds. Blue cheese. Feta cheese. String cheese. Regular cottage cheese. Buttermilk. Canned milk. Fats and oils Salted butter. Regular margarine. Ghee. Bacon fat. Seasonings and condiments Onion salt, garlic salt, seasoned salt, table salt, and sea salt. Canned and packaged gravies. Worcestershire sauce. Tartar sauce. Barbecue sauce. Teriyaki sauce. Soy sauce, including reduced-sodium soy sauce. Steak sauce. Fish sauce. Oyster sauce. Cocktail sauce. Horseradish that you find on the shelf. Regular ketchup and mustard. Meat flavorings and tenderizers. Bouillon cubes. Hot sauce. Pre-made or packaged marinades. Pre-made or packaged taco seasonings. Relishes. Regular salad dressings. Salsa. Other foods Salted popcorn and pretzels. Corn chips and puffs. Potato and tortilla chips. Canned or dried soups. Pizza. Frozen entrees and pot pies. The items listed above may not be all the foods and drinks you should avoid. Talk to a dietitian to learn more. This information is not intended to replace advice given to you by your health care provider. Make sure you discuss any questions you have with your health care provider. Document Revised: 02/11/2022 Document Reviewed: 02/11/2022 Elsevier Patient Education  2024 Elsevier Inc.   Heart-Healthy Eating Plan Eating a healthy diet is important for the health of your heart. A heart-healthy eating plan includes: Eating less unhealthy fats. Eating more healthy fats. Eating less salt in your food. Salt is also called sodium. Making other  changes in your diet. Talk with your doctor or a diet specialist (dietitian) to create an eating plan that is right for you. What is my plan? Your doctor may recommend an eating plan that includes: Total fat: ______% or less of total calories a day. Saturated fat: ______% or less of total  calories a day. Cholesterol: less than _________mg a day. Sodium: less than _________mg a day. What are tips for following this plan? Cooking Avoid frying your food. Try to bake, boil, grill, or broil it instead. You can also reduce fat by: Removing the skin from poultry. Removing all visible fats from meats. Steaming vegetables in water or broth. Meal planning  At meals, divide your plate into four equal parts: Fill one-half of your plate with vegetables and green salads. Fill one-fourth of your plate with whole grains. Fill one-fourth of your plate with lean protein foods. Eat 2-4 cups of vegetables per day. One cup of vegetables is: 1 cup (91 g) broccoli or cauliflower florets. 2 medium carrots. 1 large bell pepper. 1 large sweet potato. 1 large tomato. 1 medium white potato. 2 cups (150 g) raw leafy greens. Eat 1-2 cups of fruit per day. One cup of fruit is: 1 small apple 1 large banana 1 cup (237 g) mixed fruit, 1 large orange,  cup (82 g) dried fruit, 1 cup (240 mL) 100% fruit juice. Eat more foods that have soluble fiber. These are apples, broccoli, carrots, beans, peas, and barley. Try to get 20-30 g of fiber per day. Eat 4-5 servings of nuts, legumes, and seeds per week: 1 serving of dried beans or legumes equals  cup (90 g) cooked. 1 serving of nuts is  oz (12 almonds, 24 pistachios, or 7 walnut halves). 1 serving of seeds equals  oz (8 g). General information Eat more home-cooked food. Eat less restaurant, buffet, and fast food. Limit or avoid alcohol. Limit foods that are high in starch and sugar. Avoid fried foods. Lose weight if you are overweight. Keep track of how much salt (sodium) you eat. This is important if you have high blood pressure. Ask your doctor to tell you more about this. Try to add vegetarian meals each week. Fats Choose healthy fats. These include olive oil and canola oil, flaxseeds, walnuts, almonds, and seeds. Eat more omega-3  fats. These include salmon, mackerel, sardines, tuna, flaxseed oil, and ground flaxseeds. Try to eat fish at least 2 times each week. Check food labels. Avoid foods with trans fats or high amounts of saturated fat. Limit saturated fats. These are often found in animal products, such as meats, butter, and cream. These are also found in plant foods, such as palm oil, palm kernel oil, and coconut oil. Avoid foods with partially hydrogenated oils in them. These have trans fats. Examples are stick margarine, some tub margarines, cookies, crackers, and other baked goods. What foods should I eat? Fruits All fresh, canned (in natural juice), or frozen fruits. Vegetables Fresh or frozen vegetables (raw, steamed, roasted, or grilled). Green salads. Grains Most grains. Choose whole wheat and whole grains most of the time. Rice and pasta, including brown rice and pastas made with whole wheat. Meats and other proteins Lean, well-trimmed beef, veal, pork, and lamb. Chicken and [place] without skin. All fish and shellfish. Wild duck, rabbit, pheasant, and venison. Egg whites or low-cholesterol egg substitutes. Dried beans, peas, lentils, and tofu. Seeds and most nuts. Dairy Low-fat or nonfat cheeses, including ricotta and mozzarella. Skim or 1% milk that  is liquid, powdered, or evaporated. Buttermilk that is made with low-fat milk. Nonfat or low-fat yogurt. Fats and oils Non-hydrogenated (trans-free) margarines. Vegetable oils, including soybean, sesame, sunflower, olive, peanut, safflower, corn, canola, and cottonseed. Salad dressings or mayonnaise made with a vegetable oil. Beverages Mineral water. Coffee and tea. Diet carbonated beverages. Sweets and desserts Sherbet, gelatin, and fruit ice. Small amounts of dark chocolate. Limit all sweets and desserts. Seasonings and condiments All seasonings and condiments. The items listed above may not be a complete list of foods and drinks you can eat. Contact a  dietitian for more options. What foods should I avoid? Fruits Canned fruit in heavy syrup. Fruit in cream or butter sauce. Fried fruit. Limit coconut. Vegetables Vegetables cooked in cheese, cream, or butter sauce. Fried vegetables. Grains Breads that are made with saturated or trans fats, oils, or whole milk. Croissants. Sweet rolls. Donuts. High-fat crackers, such as cheese crackers. Meats and other proteins Fatty meats, such as hot dogs, ribs, sausage, bacon, rib-eye roast or steak. High-fat deli meats, such as salami and bologna. Caviar. Domestic duck and goose. Organ meats, such as liver. Dairy Cream, sour cream, cream cheese, and creamed cottage cheese. Whole-milk cheeses. Whole or 2% milk that is liquid, evaporated, or condensed. Whole buttermilk. Cream sauce or high-fat cheese sauce. Yogurt that is made from whole milk. Fats and oils Meat fat, or shortening. Cocoa butter, hydrogenated oils, palm oil, coconut oil, palm kernel oil. Solid fats and shortenings, including bacon fat, salt pork, lard, and butter. Nondairy cream substitutes. Salad dressings with cheese or sour cream. Beverages Regular sodas and juice drinks with added sugar. Sweets and desserts Frosting. Pudding. Cookies. Cakes. Pies. Milk chocolate or white chocolate. Buttered syrups. Full-fat ice cream or ice cream drinks. The items listed above may not be a complete list of foods and drinks to avoid. Contact a dietitian for more information. Summary Heart-healthy meal planning includes eating less unhealthy fats, eating more healthy fats, and making other changes in your diet. Eat a balanced diet. This includes fruits and vegetables, low-fat or nonfat dairy, lean protein, nuts and legumes, whole grains, and heart-healthy oils and fats. This information is not intended to replace advice given to you by your health care provider. Make sure you discuss any questions you have with your health care provider. Document Revised:  03/02/2021 Document Reviewed: 03/02/2021 Elsevier Patient Education  2024 [employer].

## 2022-08-25 ENCOUNTER — Encounter (HOSPITAL_COMMUNITY)
Admission: RE | Disposition: A | Discharge status: Home / Self Care | Payer: Self-pay | Source: Home / Self Care | Attending: Gastroenterology

## 2022-08-25 ENCOUNTER — Ambulatory Visit (HOSPITAL_COMMUNITY)
Admission: RE | Admit: 2022-08-25 | Discharge: 2022-08-25 | Disposition: A | Discharge status: Home / Self Care | Payer: Medicare Other | Attending: Gastroenterology | Admitting: Gastroenterology

## 2022-08-25 ENCOUNTER — Encounter (HOSPITAL_COMMUNITY): Payer: Self-pay | Admitting: Gastroenterology

## 2022-08-25 DIAGNOSIS — K220 Achalasia of cardia: Secondary | ICD-10-CM | POA: Diagnosis not present

## 2022-08-25 DIAGNOSIS — I5189 Other ill-defined heart diseases: Secondary | ICD-10-CM | POA: Diagnosis present

## 2022-08-25 DIAGNOSIS — R079 Chest pain, unspecified: Secondary | ICD-10-CM | POA: Diagnosis present

## 2022-08-25 DIAGNOSIS — R1310 Dysphagia, unspecified: Secondary | ICD-10-CM | POA: Diagnosis present

## 2022-08-25 HISTORY — PX: ESOPHAGEAL MANOMETRY: SHX5429

## 2022-08-25 SURGERY — MANOMETRY, ESOPHAGUS
Anesthesia: Monitor Anesthesia Care

## 2022-08-25 MED ORDER — LIDOCAINE VISCOUS HCL 2 % MT SOLN
OROMUCOSAL | Status: AC
  Filled 2022-08-25: qty 15

## 2022-08-25 SURGICAL SUPPLY — 2 items
FACESHIELD LNG OPTICON STERILE (SAFETY) IMPLANT
GLOVE BIO SURGEON STRL SZ8 (GLOVE) ×2 IMPLANT

## 2022-08-25 NOTE — Progress Notes (Signed)
Esophageal manometry performed per protocol.  Patient tolerated well.

## 2022-08-26 ENCOUNTER — Encounter (HOSPITAL_COMMUNITY): Payer: Self-pay | Admitting: Gastroenterology

## 2022-09-03 ENCOUNTER — Telehealth: Payer: Self-pay | Admitting: Cardiology

## 2022-09-03 DIAGNOSIS — K224 Dyskinesia of esophagus: Secondary | ICD-10-CM | POA: Diagnosis not present

## 2022-09-03 DIAGNOSIS — K220 Achalasia of cardia: Secondary | ICD-10-CM | POA: Diagnosis not present

## 2022-09-03 NOTE — Telephone Encounter (Signed)
Patient wanted to let Dr. Mayford Knife know she was recently diagnosed with [place] and she will be seeing a specialist in [place].  Patient stated further information is in her chart.

## 2022-09-06 ENCOUNTER — Other Ambulatory Visit: Payer: Self-pay

## 2022-09-06 DIAGNOSIS — I4819 Other persistent atrial fibrillation: Secondary | ICD-10-CM

## 2022-09-06 MED ORDER — XARELTO 20 MG PO TABS
20.0000 mg | ORAL_TABLET | Freq: Every day | ORAL | 1 refills | Status: DC
Start: 2022-09-06 — End: 2023-06-01

## 2022-09-06 NOTE — Telephone Encounter (Signed)
Patient wants to know if Dr. Mayford Knife thinks she can put off going to the rheumatologist.

## 2022-09-06 NOTE — Telephone Encounter (Signed)
Correction: Patient would like to make Dr. Mayford Knife aware + discuss any recommendations.

## 2022-09-06 NOTE — Telephone Encounter (Signed)
Prescription refill request for Xarelto received.  Indication: Afib  Last office visit: 08/23/22 Asa Lente)  Weight: 121.6kg Age: 77 Scr: 0.91 (06/01/22)  CrCl: 104.21ml/min  Appropriate dose. Refill sent.

## 2022-09-06 NOTE — Telephone Encounter (Signed)
Patient is following up. She states the achalasia will cause her to put off her rheumatology referral. She would like to make

## 2022-09-07 ENCOUNTER — Telehealth: Payer: Self-pay | Admitting: *Deleted

## 2022-09-07 DIAGNOSIS — G4733 Obstructive sleep apnea (adult) (pediatric): Secondary | ICD-10-CM

## 2022-09-07 DIAGNOSIS — I10 Essential (primary) hypertension: Secondary | ICD-10-CM

## 2022-09-07 DIAGNOSIS — I509 Heart failure, unspecified: Secondary | ICD-10-CM

## 2022-09-07 NOTE — Telephone Encounter (Signed)
Quintella Reichert, MD  Bunnie Domino; Reesa Chew, CMA  [place] please get her an appt with DME about her nasal pillow seal

## 2022-09-07 NOTE — Telephone Encounter (Signed)
Patient c/b to say she will keep appt for tomorrow. Please advise

## 2022-09-07 NOTE — Telephone Encounter (Signed)
Called patient back to let her know Dr. Mayford Knife recommends she keep rheumatolgy appt, patient verbalizes understanding and agrees.

## 2022-09-07 NOTE — Telephone Encounter (Signed)
Patient states she has been diagnosed with achalasia and may need balloon procedure or esophageal stretching procedure. She states she had a rheumatology appt tomorrow (she was referred by Dr. Mayford Knife to Dr. Henderson [place] for elevated inflammatory markers) but canceled it as she is overwhelmed with appts for her achalasia procedure. Patient is asking if she should keep the rheumatology appt and see them sooner rather than later or if the the achalasia and elevated inflammatory markers could be related. Patient also asking if workup for inflammatory markers may be required for cardiac clearance for achalasia procedure. Forwarded to Dr. Mayford Knife.

## 2022-09-07 NOTE — Telephone Encounter (Signed)
Order placed to [facility] via community message 

## 2022-09-07 NOTE — Telephone Encounter (Signed)
-----   Message from Sharlene Dory sent at 08/23/2022  8:05 PM EDT ----- She is having issues with her nasal pillow and seal.  Otherwise, doing okay.  She will follow-up in 6 months.

## 2022-09-08 DIAGNOSIS — I3139 Other pericardial effusion (noninflammatory): Secondary | ICD-10-CM | POA: Diagnosis not present

## 2022-09-08 DIAGNOSIS — R5383 Other fatigue: Secondary | ICD-10-CM | POA: Diagnosis not present

## 2022-09-08 DIAGNOSIS — R7982 Elevated C-reactive protein (CRP): Secondary | ICD-10-CM | POA: Diagnosis not present

## 2022-09-08 DIAGNOSIS — R768 Other specified abnormal immunological findings in serum: Secondary | ICD-10-CM | POA: Diagnosis not present

## 2022-09-08 DIAGNOSIS — E569 Vitamin deficiency, unspecified: Secondary | ICD-10-CM | POA: Diagnosis not present

## 2022-09-08 DIAGNOSIS — Z6841 Body Mass Index (BMI) 40.0 and over, adult: Secondary | ICD-10-CM | POA: Diagnosis not present

## 2022-09-10 DIAGNOSIS — Z006 Encounter for examination for normal comparison and control in clinical research program: Secondary | ICD-10-CM

## 2022-09-10 NOTE — Research (Signed)
Alleviate HF Research Study     Medications reviewed and action taken:   -[] Medication changes made                       -[] No medication change     **If no medication changes , specify rationale**                       -[] PRN medication intervention changes              

## 2022-09-13 ENCOUNTER — Other Ambulatory Visit (HOSPITAL_COMMUNITY): Payer: Medicare Other

## 2022-09-22 DIAGNOSIS — I3139 Other pericardial effusion (noninflammatory): Secondary | ICD-10-CM | POA: Diagnosis not present

## 2022-09-22 DIAGNOSIS — R7982 Elevated C-reactive protein (CRP): Secondary | ICD-10-CM | POA: Diagnosis not present

## 2022-09-22 DIAGNOSIS — R768 Other specified abnormal immunological findings in serum: Secondary | ICD-10-CM | POA: Diagnosis not present

## 2022-09-22 DIAGNOSIS — R5383 Other fatigue: Secondary | ICD-10-CM | POA: Diagnosis not present

## 2022-09-22 DIAGNOSIS — Z6841 Body Mass Index (BMI) 40.0 and over, adult: Secondary | ICD-10-CM | POA: Diagnosis not present

## 2022-09-30 ENCOUNTER — Other Ambulatory Visit: Payer: Self-pay | Admitting: Family Medicine

## 2022-09-30 DIAGNOSIS — Z1231 Encounter for screening mammogram for malignant neoplasm of breast: Secondary | ICD-10-CM

## 2022-10-04 ENCOUNTER — Telehealth: Payer: Self-pay | Admitting: Cardiology

## 2022-10-04 DIAGNOSIS — I3139 Other pericardial effusion (noninflammatory): Secondary | ICD-10-CM

## 2022-10-04 NOTE — Telephone Encounter (Signed)
  Pt c/o medication issue:  1. Name of Medication: colchicine 0.6 MG tablet   2. How are you currently taking this medication (dosage and times per day)?   Take 0.5 tablets (0.3 mg total) by mouth daily.    3. Are you having a reaction (difficulty breathing--STAT)? No   4. What is your medication issue? The patient mentioned that Dr. Mayford Knife prescribed this medication for 90 days, and it seems to be working well for her. She would like to know if she needs to continue taking it for another 90 days. She also inquired about whether any blood work is needed and noted that she has an appointment at the AFib clinic on 12/01/22, so she could have the blood work done on the same day. Additionally, she mentioned that her pharmacy is at the [facility] on [address]

## 2022-10-04 NOTE — Telephone Encounter (Signed)
Per Dr. Mayford Knife, called patient to advise to stop colchine and schedule CRP and ESR for 2 weeks. Patient verbalizes understanding, orders placed, labs scheduled.

## 2022-10-04 NOTE — Telephone Encounter (Signed)
Patient states she has been taking colchicine for 3 months and feels it has been helping her. She is out of refills and wanted to know if she can continue taking the colchicine. She also wanted to know if you needed labs done and if so she can get them done in October when she have her appointment with Afib clinic.

## 2022-10-06 DIAGNOSIS — D485 Neoplasm of uncertain behavior of skin: Secondary | ICD-10-CM | POA: Diagnosis not present

## 2022-10-06 DIAGNOSIS — L820 Inflamed seborrheic keratosis: Secondary | ICD-10-CM | POA: Diagnosis not present

## 2022-10-06 DIAGNOSIS — D225 Melanocytic nevi of trunk: Secondary | ICD-10-CM | POA: Diagnosis not present

## 2022-10-06 DIAGNOSIS — L218 Other seborrheic dermatitis: Secondary | ICD-10-CM | POA: Diagnosis not present

## 2022-10-08 ENCOUNTER — Ambulatory Visit: Payer: Medicare Other

## 2022-10-12 DIAGNOSIS — Z23 Encounter for immunization: Secondary | ICD-10-CM | POA: Diagnosis not present

## 2022-10-13 DIAGNOSIS — Z006 Encounter for examination for normal comparison and control in clinical research program: Secondary | ICD-10-CM

## 2022-10-13 NOTE — Research (Signed)
Alleviate HF Research Study     Medications reviewed and action taken:   -[] Medication changes made                       -[] No medication change     **If no medication changes , specify rationale**                       -[] PRN medication intervention changes              

## 2022-10-16 ENCOUNTER — Other Ambulatory Visit: Payer: Self-pay | Admitting: Cardiology

## 2022-10-18 ENCOUNTER — Ambulatory Visit: Payer: Medicare Other

## 2022-10-18 DIAGNOSIS — Z006 Encounter for examination for normal comparison and control in clinical research program: Secondary | ICD-10-CM

## 2022-10-18 NOTE — Research (Signed)
Alleviate HF Research study  24 Month  No adverse events or cardiovascular medication changes to report a this time.   Current Outpatient Medications:    alendronate (FOSAMAX) 70 MG tablet, Take 70 mg by mouth every Sunday. Take with a full glass of water on an empty stomach., Disp: , Rfl:    betamethasone dipropionate 0.05 % cream, Apply 1 application  topically 2 (two) times daily as needed (skin irritation.)., Disp: , Rfl:    Biotin w/ Vitamins C & E (HAIR SKIN & NAILS GUMMIES PO), Take 2 tablets by mouth daily. , Disp: , Rfl:    blood glucose meter kit and supplies, Dispense based on patient and insurance preference. Use up to four times daily as directed. (FOR ICD-10 E10.9, E11.9)., Disp: 1 each, Rfl: 0   Calcium-Vitamin D-Vitamin K (CALCIUM + D + K PO), Take 1 tablet by mouth in the morning., Disp: , Rfl:    carvedilol (COREG) 3.125 MG tablet, TAKE 1 TABLET BY MOUTH TWICE A DAY WITH A MEAL, Disp: 180 tablet, Rfl: 1   Cholecalciferol (VITAMIN D-3) 125 MCG (5000 UT) TABS, Take 5,000 Units by mouth in the morning., Disp: , Rfl:    colchicine 0.6 MG tablet, Take 0.5 tablets (0.3 mg total) by mouth daily., Disp: 45 tablet, Rfl: 0   dofetilide (TIKOSYN) 500 MCG capsule, Take 1 capsule (500 mcg total) by mouth 2 (two) times daily., Disp: 180 capsule, Rfl: 2   fluocinolone (SYNALAR) 0.01 % external solution, Apply 1 application topically 2 (two) times daily as needed (skin irritation)., Disp: , Rfl:    folic acid (FOLVITE) 800 MCG tablet, Take 800 mcg by mouth in the morning., Disp: , Rfl:    furosemide (LASIX) 40 MG tablet, Take 1 tablet (40 MG) daily. As needed patient may take 40 MG additional Lasix PRN by mouth daily x 4 days as directed per Alleviate Research HF Study PRN plan., Disp: 180 tablet, Rfl: 2   gabapentin (NEURONTIN) 300 MG capsule, Take 300 mg by mouth at bedtime., Disp: , Rfl:    HYDROcodone bit-homatropine (HYCODAN) 5-1.5 MG/5ML syrup, Take 5 mLs by mouth daily as needed for  cough., Disp: , Rfl:    HYDROcodone-acetaminophen (NORCO) 5-325 MG tablet, Take 1 tablet by mouth every 4 (four) hours as needed for moderate pain (kidney stone pain). (Patient taking differently: Take 1 tablet by mouth at bedtime.), Disp: 20 tablet, Rfl: 0   ibuprofen (ADVIL) 600 MG tablet, Take 1 tablet (600 mg total) by mouth 3 (three) times daily., Disp: 42 tablet, Rfl: 0   Insulin Glargine (BASAGLAR KWIKPEN [place]), Inject 34 Units into the skin in the morning., Disp: , Rfl:    Insulin Pen Needle 31G X 5 MM MISC, 12 Units by Does not apply route 3 (three) times daily., Disp: 100 each, Rfl: 0   Lancets (ONETOUCH DELICA PLUS LANCET33G) MISC, Apply topically 3 (three) times daily., Disp: , Rfl:    Misc Natural Products (GLUCOSAMINE CHONDROITIN TRIPLE) TABS, Take 2 tablets by mouth daily. , Disp: , Rfl:    Multiple Vitamins-Minerals (CENTRUM SILVER) CHEW, Chew 2 tablets by mouth daily. soft chews, Disp: , Rfl:    ONETOUCH VERIO test strip, SMARTSIG:Via Meter 3-4 Times Daily PRN, Disp: , Rfl:    pantoprazole (PROTONIX) 40 MG tablet, Take 40 mg by mouth 2 (two) times daily., Disp: , Rfl:    Polyethyl Glycol-Propyl Glycol (SYSTANE) 0.4-0.3 % SOLN, Place 1-2 drops into both eyes 3 (three) times daily as needed (dry/irritated  eyes)., Disp: , Rfl:    potassium chloride (KLOR-CON M10) 10 MEQ tablet, TAKE 2 TABLETS BY MOUTH TWICE A DAY, Disp: 360 tablet, Rfl: 2   psyllium (METAMUCIL SMOOTH TEXTURE) 28 % packet, Take 1 packet by mouth daily as needed (regularity)., Disp: , Rfl:    Semaglutide,0.25 or 0.5MG /DOS, (OZEMPIC, 0.25 OR 0.5 MG/DOSE,) 2 MG/3ML SOPN, Inject 1 mg into the skin every Saturday., Disp: , Rfl:    simvastatin (ZOCOR) 10 MG tablet, Take 1 tablet (10 mg total) by mouth at bedtime., Disp: 90 tablet, Rfl: 1   spironolactone (ALDACTONE) 25 MG tablet, TAKE 1/2 TABLET (12.5MG  TOTAL) DAILY, Disp: 45 tablet, Rfl: 2   triamcinolone cream (KENALOG) 0.1 %, Apply 1 application topically 2 (two) times daily  as needed (itching legs). , Disp: , Rfl:    XARELTO 20 MG TABS tablet, Take 1 tablet (20 mg total) by mouth daily., Disp: 90 tablet, Rfl: 1

## 2022-10-20 ENCOUNTER — Ambulatory Visit
Admission: RE | Admit: 2022-10-20 | Discharge: 2022-10-20 | Disposition: A | Discharge status: Home / Self Care | Payer: Medicare Other | Source: Ambulatory Visit | Attending: Family Medicine | Admitting: Family Medicine

## 2022-10-20 ENCOUNTER — Ambulatory Visit: Payer: Medicare Other | Attending: Cardiology

## 2022-10-20 DIAGNOSIS — I3139 Other pericardial effusion (noninflammatory): Secondary | ICD-10-CM

## 2022-10-20 DIAGNOSIS — Z1231 Encounter for screening mammogram for malignant neoplasm of breast: Secondary | ICD-10-CM | POA: Diagnosis not present

## 2022-10-21 ENCOUNTER — Telehealth: Payer: Self-pay

## 2022-10-21 LAB — C-REACTIVE PROTEIN: CRP: 8 mg/L (ref 0–10)

## 2022-10-21 LAB — SEDIMENTATION RATE: Sed Rate: 34 mm/h (ref 0–40)

## 2022-10-21 NOTE — Telephone Encounter (Signed)
Call to patient to advise that labs were normal. Dr. Mayford Knife advises to continue current medical therapy, patient verbalizes understanding.

## 2022-10-21 NOTE — Telephone Encounter (Signed)
-----   Message from Armanda Magic sent at 10/21/2022 12:51 PM EDT ----- Please let patient know that labs were normal.  Continue current medical therapy.

## 2022-10-29 ENCOUNTER — Telehealth: Payer: Self-pay | Admitting: Cardiology

## 2022-10-29 DIAGNOSIS — I4819 Other persistent atrial fibrillation: Secondary | ICD-10-CM

## 2022-10-29 DIAGNOSIS — R040 Epistaxis: Secondary | ICD-10-CM

## 2022-10-29 NOTE — Telephone Encounter (Signed)
Call to patient to ask about her nosebleeds. Patient states she uses cpap and xarelto and has never had problems with nose bleeds before until the past  year. She states twice a month she seems to get a nosebleed for which she goes through a few tissues and has to hold her nose for a few mins to get it to stop. She says she has noticed that she seems to have a problem with her nasal pillows and is not getting a good seal. Patient says she does not use nasal saline often and is wondering if the issue is with her nasal pillows. Forwarded to Dr. Mayford Knife.

## 2022-10-29 NOTE — Telephone Encounter (Signed)
Pt c/o medication issue:  1. Name of Medication:   XARELTO 20 MG TABS tablet   2. How are you currently taking this medication (dosage and times per day)?   As prescribed  3. Are you having a reaction (difficulty breathing--STAT)?   4. What is your medication issue?   Patient stated she has been getting nose bleeds and she thinks it may be related to this medication. Patient stated the nose bleed blood has been bright red.  Patient wants advice on next steps.

## 2022-11-04 MED ORDER — SALINE SPRAY 0.65 % NA SOLN: 2.0000 | Freq: Two times a day (BID) | NASAL | 3 refills | Status: AC

## 2022-11-04 NOTE — Telephone Encounter (Signed)
Call to patient to advise Dr. Mayford Knife recommends nasal saline spray 2 sprays twice daily and also advises to refer to ENT for epistaxis on anticoagulation and CPAP. Patient verbalizes understanding and agrees to plan, referral placed, med list updated.

## 2022-11-04 NOTE — Addendum Note (Signed)
Addended by: Luellen Pucker on: 11/04/2022 11:25 AM   Modules accepted: Orders

## 2022-11-15 DIAGNOSIS — Z0000 Encounter for general adult medical examination without abnormal findings: Secondary | ICD-10-CM | POA: Diagnosis not present

## 2022-11-15 DIAGNOSIS — M810 Age-related osteoporosis without current pathological fracture: Secondary | ICD-10-CM | POA: Diagnosis not present

## 2022-11-15 DIAGNOSIS — Z6841 Body Mass Index (BMI) 40.0 and over, adult: Secondary | ICD-10-CM | POA: Diagnosis not present

## 2022-11-15 DIAGNOSIS — E119 Type 2 diabetes mellitus without complications: Secondary | ICD-10-CM | POA: Diagnosis not present

## 2022-11-15 DIAGNOSIS — E7800 Pure hypercholesterolemia, unspecified: Secondary | ICD-10-CM | POA: Diagnosis not present

## 2022-11-15 DIAGNOSIS — F322 Major depressive disorder, single episode, severe without psychotic features: Secondary | ICD-10-CM | POA: Diagnosis not present

## 2022-11-15 DIAGNOSIS — M1990 Unspecified osteoarthritis, unspecified site: Secondary | ICD-10-CM | POA: Diagnosis not present

## 2022-11-15 DIAGNOSIS — Z1331 Encounter for screening for depression: Secondary | ICD-10-CM | POA: Diagnosis not present

## 2022-11-15 DIAGNOSIS — Z794 Long term (current) use of insulin: Secondary | ICD-10-CM | POA: Diagnosis not present

## 2022-11-15 DIAGNOSIS — G894 Chronic pain syndrome: Secondary | ICD-10-CM | POA: Diagnosis not present

## 2022-11-17 DIAGNOSIS — Z006 Encounter for examination for normal comparison and control in clinical research program: Secondary | ICD-10-CM

## 2022-11-17 NOTE — Research (Signed)
Alleviate HF Research Study     Medications reviewed and action taken:   -[] Medication changes made                       -[] No medication change     **If no medication changes , specify rationale**                       -[] PRN medication intervention changes              

## 2022-11-19 ENCOUNTER — Telehealth: Payer: Self-pay | Admitting: Cardiology

## 2022-11-19 NOTE — Telephone Encounter (Signed)
Pt c/o medication issue:  1. Name of Medication:  Fluoxetine 20 MG  2. How are you currently taking this medication (dosage and times per day)?  Once daily  3. Are you having a reaction (difficulty breathing--STAT)?   4. What is your medication issue?   Patient would like to inform Dr. Mayford Knife that Dr. Hyacinth Meeker started her on this medication.

## 2022-11-22 NOTE — Telephone Encounter (Signed)
Updated med list based on patient report that she has started 20 mg fluoxetine.

## 2022-12-01 ENCOUNTER — Ambulatory Visit (HOSPITAL_COMMUNITY)
Admission: RE | Admit: 2022-12-01 | Discharge: 2022-12-01 | Disposition: A | Discharge status: Home / Self Care | Payer: Medicare Other | Source: Ambulatory Visit | Attending: Physician Assistant | Admitting: Physician Assistant

## 2022-12-01 ENCOUNTER — Encounter (HOSPITAL_COMMUNITY): Payer: Self-pay | Admitting: Physician Assistant

## 2022-12-01 VITALS — BP 128/88 | HR 68 | Ht 61.5 in | Wt 263.4 lb

## 2022-12-01 DIAGNOSIS — I110 Hypertensive heart disease with heart failure: Secondary | ICD-10-CM | POA: Insufficient documentation

## 2022-12-01 DIAGNOSIS — I5032 Chronic diastolic (congestive) heart failure: Secondary | ICD-10-CM | POA: Diagnosis not present

## 2022-12-01 DIAGNOSIS — E119 Type 2 diabetes mellitus without complications: Secondary | ICD-10-CM | POA: Diagnosis not present

## 2022-12-01 DIAGNOSIS — D6869 Other thrombophilia: Secondary | ICD-10-CM | POA: Insufficient documentation

## 2022-12-01 DIAGNOSIS — Z7985 Long-term (current) use of injectable non-insulin antidiabetic drugs: Secondary | ICD-10-CM | POA: Diagnosis not present

## 2022-12-01 DIAGNOSIS — Z5181 Encounter for therapeutic drug level monitoring: Secondary | ICD-10-CM | POA: Insufficient documentation

## 2022-12-01 DIAGNOSIS — Z794 Long term (current) use of insulin: Secondary | ICD-10-CM | POA: Insufficient documentation

## 2022-12-01 DIAGNOSIS — Z7901 Long term (current) use of anticoagulants: Secondary | ICD-10-CM | POA: Insufficient documentation

## 2022-12-01 DIAGNOSIS — G4733 Obstructive sleep apnea (adult) (pediatric): Secondary | ICD-10-CM | POA: Insufficient documentation

## 2022-12-01 DIAGNOSIS — I4819 Other persistent atrial fibrillation: Secondary | ICD-10-CM | POA: Diagnosis not present

## 2022-12-01 DIAGNOSIS — Z79899 Other long term (current) drug therapy: Secondary | ICD-10-CM | POA: Insufficient documentation

## 2022-12-01 DIAGNOSIS — Z6841 Body Mass Index (BMI) 40.0 and over, adult: Secondary | ICD-10-CM | POA: Insufficient documentation

## 2022-12-01 LAB — BASIC METABOLIC PANEL
Anion gap: 9 (ref 5–15)
BUN: 14 mg/dL (ref 8–23)
CO2: 26 mmol/L (ref 22–32)
Calcium: 9.3 mg/dL (ref 8.9–10.3)
Chloride: 104 mmol/L (ref 98–111)
Creatinine, Ser: 0.88 mg/dL (ref 0.44–1.00)
GFR, Estimated: >60 mL/min (ref >60)
Glucose, Bld: 149 mg/dL — ABNORMAL HIGH (ref 70–99)
Potassium: 4.2 mmol/L (ref 3.5–5.1)
Sodium: 139 mmol/L (ref 135–145)

## 2022-12-01 LAB — MAGNESIUM: Magnesium: 2.2 mg/dL (ref 1.7–2.4)

## 2022-12-01 MED ORDER — DOFETILIDE 500 MCG PO CAPS: 500.0000 ug | ORAL_CAPSULE | Freq: Two times a day (BID) | ORAL | 2 refills | Status: DC

## 2022-12-01 NOTE — Progress Notes (Signed)
Primary Care Physician: Sigmund Hazel, MD Primary Cardiologist: Dr Mayford Knife Primary Electrophysiologist: Dr Johney Frame  Referring Physician: Dr Romilda Joy is a 77 y.o. female with a history of HTN, DM, chronic diastolic CHF, atrial fibrillation who presents for follow up in the [facility]. Seen previously by Rudi Coco in 2018. She has been maintained on sotalol since 2018. Patient is on Xarelto for a CHADS2VASC score of 5. She was seen by Dr Mayford Knife on 05/13/20 and was found to be back in afib. She admits she had stayed up late for a few nights and had not used her CPAP. She has noticed increased fatigue since then. Patient is s/p DCCV 06/03/20. She had an echocardiogram on 07/18/20 and was noted to be back in afib. Patient is s/p dofetilide admission 7/19-7/22/22. She converted with the medication and did not require DCCV.   On follow up today, patient reports that she has done well since her last visit. Her ILR will show occasional afib episodes, nothing persistent. She is asymptomatic during these episodes. No bleeding issues on anticoagulation.   Today, she denies symptoms of palpitations, chest pain, shortness of breath, orthopnea, PND, lower extremity edema, dizziness, presyncope, syncope, bleeding, or neurologic sequela. The patient is tolerating medications without difficulties and is otherwise without complaint today.    Atrial Fibrillation Risk Factors:  she does have symptoms or diagnosis of sleep apnea. she is compliant with CPAP therapy. she does not have a history of rheumatic fever.   Atrial Fibrillation Management history:  Previous antiarrhythmic drugs: sotalol, dofetilide  Previous cardioversions: 2018 x 3, 06/03/20 Previous ablations: none Anticoagulation history: Xarelto    Past Medical History:  Diagnosis Date   Abdominal pain, left lower quadrant    Asthmatic bronchitis    Benign essential HTN 04/26/2016   Bursitis of hip     Chronic diastolic CHF (congestive heart failure) (HCC)    DCM (dilated cardiomyopathy) (HCC)    EF 50-55% in Jan 2023   GERD (gastroesophageal reflux disease)    High cholesterol    History of kidney stones    Hypersomnia    Morbid obesity with BMI of 50.0-59.9, adult (HCC)    OSA on CPAP    Osteoarthritis    "right hip; both knees" (09/21/2016)   Persistent atrial fibrillation (HCC) 04/26/2016   Type II diabetes mellitus (HCC)    Vitamin D deficiency disease     Current Outpatient Medications  Medication Sig Dispense Refill   alendronate (FOSAMAX) 70 MG tablet Take 70 mg by mouth every Sunday. Take with a full glass of water on an empty stomach.     betamethasone dipropionate 0.05 % cream Apply 1 application  topically 2 (two) times daily as needed (skin irritation.).     Biotin w/ Vitamins C & E (HAIR SKIN & NAILS GUMMIES PO) Take 2 tablets by mouth daily.      blood glucose meter kit and supplies Dispense based on patient and insurance preference. Use up to four times daily as directed. (FOR ICD-10 E10.9, E11.9). 1 each 0   Calcium-Vitamin D-Vitamin K (CALCIUM + D + K PO) Take 1 tablet by mouth in the morning.     carvedilol (COREG) 3.125 MG tablet TAKE 1 TABLET BY MOUTH TWICE A DAY WITH FOOD 180 tablet 1   Cholecalciferol (VITAMIN D-3) 125 MCG (5000 UT) TABS Take 5,000 Units by mouth in the morning.     colchicine 0.6 MG tablet Take 0.5  tablets (0.3 mg total) by mouth daily. 45 tablet 0   dofetilide (TIKOSYN) 500 MCG capsule Take 1 capsule (500 mcg total) by mouth 2 (two) times daily. 180 capsule 2   fluocinolone (SYNALAR) 0.01 % external solution Apply 1 application topically 2 (two) times daily as needed (skin irritation).     FLUoxetine (PROZAC) 20 MG capsule Take 20 mg by mouth daily.     folic acid (FOLVITE) 800 MCG tablet Take 800 mcg by mouth in the morning.     furosemide (LASIX) 40 MG tablet Take 1 tablet (40 MG) daily. As needed patient may take 40 MG additional Lasix PRN  by mouth daily x 4 days as directed per [employer] Study PRN plan. 180 tablet 2   gabapentin (NEURONTIN) 300 MG capsule Take 300 mg by mouth at bedtime.     HYDROcodone bit-homatropine (HYCODAN) 5-1.5 MG/5ML syrup Take 5 mLs by mouth daily as needed for cough.     HYDROcodone-acetaminophen (NORCO) 5-325 MG tablet Take 1 tablet by mouth every 4 (four) hours as needed for moderate pain (kidney stone pain). (Patient taking differently: Take 1 tablet by mouth at bedtime.) 20 tablet 0   ibuprofen (ADVIL) 600 MG tablet Take 1 tablet (600 mg total) by mouth 3 (three) times daily. 42 tablet 0   Insulin Glargine (BASAGLAR KWIKPEN [place]) Inject 34 Units into the skin in the morning.     Insulin Pen Needle 31G X 5 MM MISC 12 Units by Does not apply route 3 (three) times daily. 100 each 0   Lancets (ONETOUCH DELICA PLUS LANCET33G) MISC Apply topically 3 (three) times daily.     Misc Natural Products (GLUCOSAMINE CHONDROITIN TRIPLE) TABS Take 2 tablets by mouth daily.      Multiple Vitamins-Minerals (CENTRUM SILVER) CHEW Chew 2 tablets by mouth daily. soft chews     ONETOUCH VERIO test strip SMARTSIG:Via Meter 3-4 Times Daily PRN     pantoprazole (PROTONIX) 40 MG tablet Take 40 mg by mouth 2 (two) times daily.     Polyethyl Glycol-Propyl Glycol (SYSTANE) 0.4-0.3 % SOLN Place 1-2 drops into both eyes 3 (three) times daily as needed (dry/irritated eyes).     potassium chloride (KLOR-CON M10) 10 MEQ tablet TAKE 2 TABLETS BY MOUTH TWICE A DAY 360 tablet 2   psyllium (METAMUCIL SMOOTH TEXTURE) 28 % packet Take 1 packet by mouth daily as needed (regularity).     Semaglutide,0.25 or 0.5MG /DOS, (OZEMPIC, 0.25 OR 0.5 MG/DOSE,) 2 MG/3ML SOPN Inject 1 mg into the skin every Saturday.     simvastatin (ZOCOR) 10 MG tablet Take 1 tablet (10 mg total) by mouth at bedtime. 90 tablet 1   sodium chloride (OCEAN) 0.65 % SOLN nasal spray Place 2 sprays into both nostrils in the morning and at bedtime. 66 mL 3    spironolactone (ALDACTONE) 25 MG tablet TAKE 1/2 TABLET (12.5MG  TOTAL) DAILY 45 tablet 2   triamcinolone cream (KENALOG) 0.1 % Apply 1 application topically 2 (two) times daily as needed (itching legs).      XARELTO 20 MG TABS tablet Take 1 tablet (20 mg total) by mouth daily. 90 tablet 1   No current facility-administered medications for this encounter.    ROS- All systems are reviewed and negative except as per the HPI above.  Physical Exam: Vitals:   12/01/22 1349  BP: 128/88  Pulse: 68  Weight: 119.5 kg  Height: 5' 1.5" (1.562 m)    GEN: Well nourished, well developed in no acute  distress NECK: No JVD; No carotid bruits CARDIAC: Regular rate and rhythm, no murmurs, rubs, gallops RESPIRATORY:  Clear to auscultation without rales, wheezing or rhonchi  ABDOMEN: Soft, non-tender, non-distended EXTREMITIES:  No edema; No deformity    Wt Readings from Last 3 Encounters:  12/01/22 119.5 kg  08/23/22 121.6 kg  07/13/22 117.9 kg    EKG today demonstrates  SR, LAFB Vent. rate 68 BPM PR interval 166 ms QRS duration 112 ms QT/QTcB 466/495 ms  Echo 04/03/21 demonstrated  1. Left ventricular ejection fraction, by estimation, is 50 to 55%. The  left ventricle has low normal function. The left ventricle has no regional wall motion abnormalities. There is mild left ventricular hypertrophy. Left ventricular diastolic parameters are indeterminate.   2. Right ventricular systolic function is normal. The right ventricular  size is normal. Moderately increased right ventricular wall thickness.  Tricuspid regurgitation signal is inadequate for assessing PA pressure.   3. A small pericardial effusion is present.   4. The mitral valve is normal in structure. No evidence of mitral valve regurgitation. No evidence of mitral stenosis.   5. The aortic valve was not well visualized. Aortic valve regurgitation  is trivial. Aortic valve sclerosis/calcification is present, without any  evidence of  aortic stenosis.   6. The inferior vena cava is normal in size with greater than 50%  respiratory variability, suggesting right atrial pressure of 3 mmHg.   Epic records are reviewed at length today  CHA2DS2-VASc Score = 5  The patient's score is based upon: CHF History: 1 HTN History: 1 Diabetes History: 1 Stroke History: 0 Vascular Disease History: 0 Age Score: 1 Gender Score: 1        ASSESSMENT AND PLAN: Persistent Atrial Fibrillation (ICD10:  I48.19) The patient's CHA2DS2-VASc score is 5, indicating a 7.2% annual risk of stroke.  S/p dofetilide admission 7/19-7/22/22 ILR shows low burden of afib Continue dofetilide 500 mcg BID, QT stable Check bmet/mag today Continue Continue Xarelto 20 mg daily Continue Coreg 3.125 mg BID   Secondary Hypercoagulable State (ICD10:  D68.69) The patient is at significant risk for stroke/thromboembolism based upon her CHA2DS2-VASc Score of 5.  Continue Rivaroxaban (Xarelto).   Obesity Body mass index is 48.96 kg/m.  Encouraged lifestyle modification Patient on Ozempic.   OSA  Encouraged nightly CPAP Followed by Dr Mayford Knife  HTN Stable on current regimen  Chronic diastolic CHF Enrolled in [employer] Fluid status appears stable    Follow up in the AF clinic in 6 months.    Jorja Loa PA-C [facility] [address] [place], [place] [address] [phone] 12/01/2022 2:03 PM

## 2022-12-09 DIAGNOSIS — F332 Major depressive disorder, recurrent severe without psychotic features: Secondary | ICD-10-CM | POA: Diagnosis not present

## 2022-12-13 ENCOUNTER — Telehealth: Payer: Self-pay | Admitting: Cardiology

## 2022-12-13 DIAGNOSIS — Z006 Encounter for examination for normal comparison and control in clinical research program: Secondary | ICD-10-CM

## 2022-12-13 MED ORDER — CARVEDILOL 3.125 MG PO TABS: 3.1250 mg | ORAL_TABLET | Freq: Two times a day (BID) | ORAL | 2 refills | Status: DC

## 2022-12-13 MED ORDER — SPIRONOLACTONE 25 MG PO TABS: ORAL_TABLET | ORAL | 2 refills | Status: DC

## 2022-12-13 NOTE — Telephone Encounter (Signed)
*  STAT* If patient is at the pharmacy, call can be transferred to refill team.   1. Which medications need to be refilled? (please list name of each medication and dose if known)  carvedilol (COREG) 3.125 MG tablet  spironolactone (ALDACTONE) 25 MG tablet  2. Which pharmacy/location (including street and city if local pharmacy) is medication to be sent to?  [id number] - [place], [place] - [address] AT [facility] [address] [facility] [address] Phone: [phone]  Fax: [phone]      3. Do they need a 30 day or 90 day supply? 90

## 2022-12-13 NOTE — Research (Signed)
Medications reviewed and action taken:   -'[]'$  Medication changes made                       -'[]'$  No medication change     **If no medication changes , specify rationale**                       -'[]'$  PRN medication intervention changes

## 2022-12-14 ENCOUNTER — Other Ambulatory Visit: Payer: Self-pay

## 2022-12-14 MED ORDER — SPIRONOLACTONE 25 MG PO TABS: ORAL_TABLET | ORAL | 2 refills | Status: DC

## 2022-12-29 DIAGNOSIS — F322 Major depressive disorder, single episode, severe without psychotic features: Secondary | ICD-10-CM | POA: Diagnosis not present

## 2022-12-29 DIAGNOSIS — Z6841 Body Mass Index (BMI) 40.0 and over, adult: Secondary | ICD-10-CM | POA: Diagnosis not present

## 2022-12-29 DIAGNOSIS — E119 Type 2 diabetes mellitus without complications: Secondary | ICD-10-CM | POA: Diagnosis not present

## 2022-12-31 ENCOUNTER — Telehealth: Payer: Self-pay | Admitting: Internal Medicine

## 2022-12-31 NOTE — Telephone Encounter (Signed)
Received call from pt due to missed medication dose. Pt is on tikosyn and she missed a single dose Thursday morning due to a power outage. She was extremely anxious over the phone and worried about going back into Afib due to the missed dose. Re-assured her and asked her to just continue taking her meds as prescribed without doubling up on meds to make up for the missed dose.

## 2023-01-08 DIAGNOSIS — F332 Major depressive disorder, recurrent severe without psychotic features: Secondary | ICD-10-CM | POA: Diagnosis not present

## 2023-01-25 ENCOUNTER — Telehealth: Payer: Self-pay | Admitting: Cardiology

## 2023-01-25 DIAGNOSIS — Z006 Encounter for examination for normal comparison and control in clinical research program: Secondary | ICD-10-CM

## 2023-01-25 NOTE — Telephone Encounter (Signed)
Patient called to report that her machine is recording her sleep time from mid-day (noon) to mid-day (noon) so her recordings are splitting the time when recording past 12 noon.  Patient noted her appointment to the Gastroenterologist at [facility] had to be rescheduled to March 7 and she had to up her fluid pill dosage for 4 days.

## 2023-01-25 NOTE — Telephone Encounter (Signed)
Call to patient to answer questions. Patient states she has resolved her questions about her cpap machine and had some new medications to report which were added to medication list. Patient reports her lasix was increased by Dr. Lalla Brothers today, which is documented in chart.

## 2023-01-25 NOTE — Research (Signed)
Medications reviewed and action taken:   -'[]'$  Medication changes made                       -'[]'$  No medication change     **If no medication changes , specify rationale**                       -'[]'$  PRN medication intervention changes

## 2023-02-08 DIAGNOSIS — F332 Major depressive disorder, recurrent severe without psychotic features: Secondary | ICD-10-CM | POA: Diagnosis not present

## 2023-02-14 ENCOUNTER — Other Ambulatory Visit (HOSPITAL_COMMUNITY): Payer: Self-pay | Admitting: *Deleted

## 2023-02-14 MED ORDER — DOFETILIDE 500 MCG PO CAPS: 500.0000 ug | ORAL_CAPSULE | Freq: Two times a day (BID) | ORAL | 2 refills | Status: DC

## 2023-02-22 DIAGNOSIS — Z006 Encounter for examination for normal comparison and control in clinical research program: Secondary | ICD-10-CM

## 2023-02-22 NOTE — Research (Signed)
Alleviate HF Research Study     Medications reviewed and action taken:   -[] Medication changes made                       -[] No medication change     **If no medication changes , specify rationale**                       -[] PRN medication intervention changes              

## 2023-02-24 DIAGNOSIS — E119 Type 2 diabetes mellitus without complications: Secondary | ICD-10-CM | POA: Diagnosis not present

## 2023-02-24 DIAGNOSIS — F322 Major depressive disorder, single episode, severe without psychotic features: Secondary | ICD-10-CM | POA: Diagnosis not present

## 2023-02-25 ENCOUNTER — Ambulatory Visit: Payer: Medicare Other | Attending: Cardiology | Admitting: Cardiology

## 2023-02-25 ENCOUNTER — Ambulatory Visit: Payer: Medicare Other | Admitting: Cardiology

## 2023-02-25 ENCOUNTER — Encounter: Payer: Self-pay | Admitting: Cardiology

## 2023-02-25 VITALS — BP 134/81 | HR 67 | Ht 62.0 in | Wt 259.8 lb

## 2023-02-25 VITALS — BP 126/62 | HR 68 | Ht 61.5 in | Wt 259.0 lb

## 2023-02-25 DIAGNOSIS — I10 Essential (primary) hypertension: Secondary | ICD-10-CM | POA: Diagnosis not present

## 2023-02-25 DIAGNOSIS — I5032 Chronic diastolic (congestive) heart failure: Secondary | ICD-10-CM | POA: Diagnosis not present

## 2023-02-25 DIAGNOSIS — G4733 Obstructive sleep apnea (adult) (pediatric): Secondary | ICD-10-CM | POA: Diagnosis not present

## 2023-02-25 DIAGNOSIS — I480 Paroxysmal atrial fibrillation: Secondary | ICD-10-CM | POA: Diagnosis not present

## 2023-02-25 DIAGNOSIS — R0789 Other chest pain: Secondary | ICD-10-CM | POA: Diagnosis not present

## 2023-02-25 DIAGNOSIS — I3139 Other pericardial effusion (noninflammatory): Secondary | ICD-10-CM | POA: Insufficient documentation

## 2023-02-25 NOTE — Patient Instructions (Addendum)
Medication Instructions:  Your physician recommends that you continue on your current medications as directed. Please refer to the Current Medication list given to you today.  *If you need a refill on your cardiac medications before your next appointment, please call your pharmacy*  Lab Work: TODAY: CBC, CMET, Magnesium If you have labs (blood work) drawn today and your tests are completely normal, you will receive your results only by: MyChart Message (if you have MyChart) OR A paper copy in the mail If you have any lab test that is abnormal or we need to change your treatment, we will call you to review the results.  Testing/Procedures: Your physician has requested that you have an echocardiogram in June 2025. Echocardiography is a painless test that uses sound waves to create images of your heart. It provides your doctor with information about the size and shape of your heart and how well your heart's chambers and valves are working. This procedure takes approximately one hour. There are no restrictions for this procedure. Please do NOT wear cologne, perfume, aftershave, or lotions (deodorant is allowed). Please arrive 15 minutes prior to your appointment time.  Please note: We ask at that you not bring children with you during ultrasound (echo/ vascular) testing. Due to room size and safety concerns, children are not allowed in the ultrasound rooms during exams. Our front office staff cannot provide observation of children in our lobby area while testing is being conducted. An adult accompanying a patient to their appointment will only be allowed in the ultrasound room at the discretion of the ultrasound technician under special circumstances. We apologize for any inconvenience.  Follow-Up: At [facility], you and your health needs are our priority.  As part of our continuing mission to provide you with exceptional heart care, we have created designated Provider Care Teams.  These Care  Teams include your primary Cardiologist (physician) and Advanced Practice Providers (APPs -  Physician Assistants and Nurse Practitioners) who all work together to provide you with the care you need, when you need it.  Your next appointment:   1 year(s)  The format for your next appointment:   In Person  Provider:   Armanda Magic, MD {

## 2023-02-25 NOTE — Progress Notes (Signed)
Patient changed from Virtual visit to in office visit due to technical problems.   Date:  02/25/2023   ID:  Karen Rasmussen, DOB 05-Jun-1945, MRN [id number] The patient was identified using 2 identifiers. PCP:  Karen Hazel, MD    [facility] Providers Cardiologist:  Karen Magic, MD     Evaluation Performed:  Follow-Up Visit  Chief Complaint:  She  History of Present Illness:    Karen Rasmussen is a 78 y.o. female with history of normal coronary arteries with calcium score of 0 on coronary CTA in 09/2016, chronic diastolic CHF/non-ischemic cardiomyopathy with EF of 50-55%, paroxysmal atrial fibrillation s/p DCCV x2 on Xarelto, obstructive sleep apnea on CPAP, hypertension, diabetes mellitus, and morbid obesity. She had a reoccurrence of her afib and was seen in afib clinic and her Betapace was switched to Tikosyn. She uses a Naval architect and has not had any further PAF.   She is here today for followup and is doing well.  She was seen by Dr. Marca Ancona for chest pain and was found to have esophageal achalasia and had botox therapy done which has helped her CP.  She is going to be referred to [facility] for further evaluation.  She has had some very mild bouts of esophageal pain associated with belching.  She denies any exertional chest pain or pressure, SOB, DOE (except extreme exertion), PND, orthopnea, LE edema, dizziness, palpitations or syncope. She is compliant with her meds and is tolerating meds with no SE.    She is doing well with her PAP device and thinks that she has gotten used to it.  She tolerates the mask and feels the pressure is adequate.  Since going on PAP she feels rested in the am but does nap sometimes during the day and uses her CPAP.  She denies any significant mouth or nasal dryness or nasal congestion but sometimes her nose bleed.  She does not think that she snores.    Past Medical History:  Diagnosis Date   Abdominal pain, left lower quadrant    Asthmatic  bronchitis    Benign essential HTN 04/26/2016   Bursitis of hip    Chronic diastolic CHF (congestive heart failure) (HCC)    DCM (dilated cardiomyopathy) (HCC)    EF 50-55% in Jan 2023   GERD (gastroesophageal reflux disease)    High cholesterol    History of kidney stones    Hypersomnia    Morbid obesity with BMI of 50.0-59.9, adult (HCC)    OSA on CPAP    Osteoarthritis    "right hip; both knees" (09/21/2016)   Persistent atrial fibrillation (HCC) 04/26/2016   Type II diabetes mellitus (HCC)    Vitamin D deficiency disease    Past Surgical History:  Procedure Laterality Date   ABDOMINAL HYSTERECTOMY     APPENDECTOMY     BALLOON DILATION N/A 11/17/2017   Procedure: BALLOON DILATION;  Surgeon: Kerin Salen, MD;  Location: [facility];  Service: Gastroenterology;  Laterality: N/A;   BIOPSY  11/17/2017   Procedure: BIOPSY;  Surgeon: Kerin Salen, MD;  Location: [facility];  Service: Gastroenterology;;   BIOPSY  07/13/2022   Procedure: BIOPSY;  Surgeon: Kerin Salen, MD;  Location: [facility];  Service: Gastroenterology;;   BOTOX INJECTION  07/13/2022   Procedure: BOTOX INJECTION;  Surgeon: Kerin Salen, MD;  Location: [facility];  Service: Gastroenterology;;   CARDIAC CATHETERIZATION     CARDIOVERSION N/A 06/11/2016   Procedure: CARDIOVERSION;  Surgeon: Lars Masson,  MD;  Location: [facility];  Service: Cardiovascular;  Laterality: N/A;   CARDIOVERSION N/A 07/16/2016   Procedure: CARDIOVERSION;  Surgeon: Vesta Mixer, MD;  Location: [facility]COPY;  Service: Cardiovascular;  Laterality: N/A;   CARDIOVERSION N/A 09/23/2016   Procedure: CARDIOVERSION;  Surgeon: Thurmon Fair, MD;  Location: [facility];  Service: Cardiovascular;  Laterality: N/A;   CARDIOVERSION N/A 06/03/2020   Procedure: CARDIOVERSION;  Surgeon: Little Ishikawa, MD;  Location: [facility] ENDOSCOPY;  Service: Cardiovascular;  Laterality: N/A;   CHOLECYSTECTOMY OPEN     COLONOSCOPY N/A 11/17/2017    Procedure: COLONOSCOPY;  Surgeon: Kerin Salen, MD;  Location: [facility];  Service: Gastroenterology;  Laterality: N/A;   cortisone injection to right knee     costisone injection to left knee  11/03/2017   CYSTOSCOPY WITH RETROGRADE PYELOGRAM, URETEROSCOPY AND STENT PLACEMENT Left 01/14/2019   Procedure: CYSTOSCOPy  STENT PLACEMENT;  Surgeon: Noel Christmas, MD;  Location: [facility];  Service: Urology;  Laterality: Left;   CYSTOSCOPY WITH RETROGRADE PYELOGRAM, URETEROSCOPY AND STENT PLACEMENT Left 03/06/2019   Procedure: CYSTOSCOPY WITH RETROGRADE PYELOGRAM, URETEROSCOPY AND STENT PLACEMENT;  Surgeon: Noel Christmas, MD;  Location: [facility];  Service: Urology;  Laterality: Left;  90 MINS   CYSTOSCOPY WITH STENT PLACEMENT Left 03/07/2019   Procedure: CYSTOSCOPY WITH , URETEROSCOPY/ STENT PLACEMENT;  Surgeon: Crist Fat, MD;  Location: [facility];  Service: Urology;  Laterality: Left;   DILATION AND CURETTAGE OF UTERUS     S/P miscarriage   ESOPHAGEAL MANOMETRY N/A 08/25/2022   Procedure: ESOPHAGEAL MANOMETRY (EM);  Surgeon: Charlott Rakes, MD;  Location: [facility];  Service: Gastroenterology;  Laterality: N/A;   ESOPHAGOGASTRODUODENOSCOPY N/A 11/17/2017   Procedure: ESOPHAGOGASTRODUODENOSCOPY (EGD);  Surgeon: Kerin Salen, MD;  Location: [facility];  Service: Gastroenterology;  Laterality: N/A;   ESOPHAGOGASTRODUODENOSCOPY (EGD) WITH PROPOFOL N/A 07/13/2022   Procedure: ESOPHAGOGASTRODUODENOSCOPY (EGD) WITH PROPOFOL;  Surgeon: Kerin Salen, MD;  Location: [facility];  Service: Gastroenterology;  Laterality: N/A;   HOLMIUM LASER APPLICATION Left 03/06/2019   Procedure: HOLMIUM LASER APPLICATION;  Surgeon: Noel Christmas, MD;  Location: [facility];  Service: Urology;  Laterality: Left;   implantable loop recorder placement  10/20/2020   Medtronic Reveal Linq model LNQ 11 (SN [id number]) implantable loop recorder implanted for Alleviate HF trial   NASAL SEPTUM SURGERY     POLYPECTOMY   11/17/2017   Procedure: POLYPECTOMY;  Surgeon: Kerin Salen, MD;  Location: [facility];  Service: Gastroenterology;;   POLYPECTOMY  07/13/2022   Procedure: POLYPECTOMY;  Surgeon: Kerin Salen, MD;  Location: [facility];  Service: Gastroenterology;;   TONSILLECTOMY       Current Meds  Medication Sig   alendronate (FOSAMAX) 70 MG tablet Take 70 mg by mouth every Sunday. Take with a full glass of water on an empty stomach.   betamethasone dipropionate 0.05 % cream Apply 1 application  topically 2 (two) times daily as needed (skin irritation.).   Biotin w/ Vitamins C & E (HAIR SKIN & NAILS GUMMIES PO) Take 2 tablets by mouth daily.    blood glucose meter kit and supplies Dispense based on patient and insurance preference. Use up to four times daily as directed. (FOR ICD-10 E10.9, E11.9).   Calcium-Vitamin D-Vitamin K (CALCIUM + D + K PO) Take 1 tablet by mouth in the morning.   carvedilol (COREG) 3.125 MG tablet Take 1 tablet (3.125 mg total) by mouth 2 (two) times daily with a meal.   Cholecalciferol (VITAMIN D-3) 125 MCG (5000  UT) TABS Take 5,000 Units by mouth in the morning.   dofetilide (TIKOSYN) 500 MCG capsule Take 1 capsule (500 mcg total) by mouth 2 (two) times daily.   fluocinolone (SYNALAR) 0.01 % external solution Apply 1 application topically 2 (two) times daily as needed (skin irritation).   FLUoxetine (PROZAC) 20 MG capsule Take 20 mg by mouth daily.   folic acid (FOLVITE) 800 MCG tablet Take 800 mcg by mouth in the morning.   furosemide (LASIX) 40 MG tablet Take 1 tablet (40 MG) daily. As needed patient may take 40 MG additional Lasix PRN by mouth daily x 4 days as directed per [employer] HF Study PRN plan.   gabapentin (NEURONTIN) 300 MG capsule Take 300 mg by mouth at bedtime.   HYDROcodone bit-homatropine (HYCODAN) 5-1.5 MG/5ML syrup Take 5 mLs by mouth daily as needed for cough.   HYDROcodone-acetaminophen (NORCO) 5-325 MG tablet Take 1 tablet by mouth every 4 (four)  hours as needed for moderate pain (kidney stone pain). (Patient taking differently: Take 1 tablet by mouth at bedtime.)   ibuprofen (ADVIL) 600 MG tablet Take 1 tablet (600 mg total) by mouth 3 (three) times daily.   Insulin Glargine (BASAGLAR KWIKPEN Blades) Inject 40 Units into the skin in the morning.   Insulin Pen Needle 31G X 5 MM MISC 12 Units by Does not apply route 3 (three) times daily.   Lancets (ONETOUCH DELICA PLUS LANCET33G) MISC Apply topically 3 (three) times daily.   Misc Natural Products (GLUCOSAMINE CHONDROITIN TRIPLE) TABS Take 2 tablets by mouth daily.  (Patient not taking: Reported on 02/25/2023)   Multiple Vitamins-Minerals (CENTRUM SILVER) CHEW Chew 2 tablets by mouth daily. soft chews   ONETOUCH VERIO test strip SMARTSIG:Via Meter 3-4 Times Daily PRN   pantoprazole (PROTONIX) 40 MG tablet Take 40 mg by mouth 2 (two) times daily.   Polyethyl Glycol-Propyl Glycol (SYSTANE) 0.4-0.3 % SOLN Place 1-2 drops into both eyes 3 (three) times daily as needed (dry/irritated eyes).   potassium chloride (KLOR-CON M10) 10 MEQ tablet TAKE 2 TABLETS BY MOUTH TWICE A DAY   psyllium (METAMUCIL SMOOTH TEXTURE) 28 % packet Take 1 packet by mouth daily as needed (regularity).   Semaglutide,0.25 or 0.5MG /DOS, (OZEMPIC, 0.25 OR 0.5 MG/DOSE,) 2 MG/3ML SOPN Inject 1 mg into the skin every Saturday.   simvastatin (ZOCOR) 10 MG tablet Take 1 tablet (10 mg total) by mouth at bedtime.   sodium chloride (OCEAN) 0.65 % SOLN nasal spray Place 2 sprays into both nostrils in the morning and at bedtime.   spironolactone (ALDACTONE) 25 MG tablet TAKE 1/2 TABLET (12.5MG  TOTAL) DAILY   triamcinolone cream (KENALOG) 0.1 % Apply 1 application topically 2 (two) times daily as needed (itching legs).    XARELTO 20 MG TABS tablet Take 1 tablet (20 mg total) by mouth daily.     Allergies:   Diphenhydramine and No healthtouch food allergies   Social History   Tobacco Use   Smoking status: Never   Smokeless tobacco:  Never   Tobacco comments:    Never smoke 05/19/21  Vaping Use   Vaping status: Never Used  Substance Use Topics   Alcohol use: Not Currently   Drug use: No     Family Hx: The patient's family history includes CAD in her mother; Cancer in her father; Diabetes in her brother and father; Heart failure in her father; Hypertension in her brother. There is no history of Breast cancer.  ROS:   Please see the history  of present illness.     All other systems reviewed and are negative.   Prior CV studies:   The following studies were reviewed today:  PAP compliance download  Labs/Other Tests and Data Reviewed:    EKG:  No ECG reviewed.  Recent Labs: 06/01/2022: TSH 2.379 06/24/2022: Hemoglobin 12.6; Platelets 257 12/01/2022: BUN 14; Creatinine, Ser 0.88; Magnesium 2.2; Potassium 4.2; Sodium 139   Recent Lipid Panel No results found for: "CHOL", "TRIG", "HDL", "CHOLHDL", "LDLCALC", "LDLDIRECT"  Wt Readings from Last 3 Encounters:  02/25/23 259 lb (117.5 kg)  02/25/23 259 lb 12.8 oz (117.8 kg)  12/01/22 263 lb 6.4 oz (119.5 kg)     Risk Assessment/Calculations:    CHA2DS2-VASc Score = 5   This indicates a 7.2% annual risk of stroke. The patient's score is based upon: CHF History: 1 HTN History: 1 Diabetes History: 0 Stroke History: 0 Vascular Disease History: 0 Age Score: 2 Gender Score: 1         Objective:    Vital Signs:  BP 134/81   Pulse 67   Ht 5\' 2"  (1.575 m)   Wt 259 lb 12.8 oz (117.8 kg)   BMI 47.52 kg/m    GEN: Well nourished, well developed in no acute distress HEENT: Normal NECK: No JVD; No carotid bruits LYMPHATICS: No lymphadenopathy CARDIAC:RRR, no murmurs, rubs, gallops RESPIRATORY:  Clear to auscultation without rales, wheezing or rhonchi  ABDOMEN: Soft, non-tender, non-distended MUSCULOSKELETAL:  No edema; No deformity  SKIN: Warm and dry NEUROLOGIC:  Alert and oriented x 3 PSYCHIATRIC:  Normal affect  ASSESSMENT & PLAN:    OSA -  The patient is tolerating PAP therapy well without any problems. The PAP download performed by his DME was personally reviewed and interpreted by me today and showed an AHI of 0.6 /hr on auto CPAP from 4-18 cm H2O with 100% compliance in using more than 4 hours nightly.  The patient has been using and benefiting from PAP use and will continue to benefit from therapy.   Essential hypertension  -BP has been controlled at home -Continue prescription drug management with carvedilol 3.125 mg twice daily and spironolactone 12.5 mg daily with as needed refills   Pericardial effusion -stable small effusion on echo 03/2021 -Echo 6/24 EF 55 to 60% with small pericardial effusion -Repeat echo 07/29/2023   Atypical chest pain  -Her chest pain in the past has been atypical being sharp and nonexertional -she had a normal coronary CTA in 2018 with coronary Ca score  0 and I do not think that her CP is related to coronary ischemia -she has a hx of esophageal dilatation and has felt that her chest pain has been GI in the past -recently dx with esophageal achalasia and s/p botox injections>>she is being referred to [facility]  Chronic LE edema/Chronic diastolic CHF -she is involved in the Alleviate HF study and recently was told to increase Lasix to 40mg  BID x 4 days and is now back down to 40mg  daily -She does not appear volume overloaded on exam today -Continue prescription drug management with carvedilol 3.25 mg twice daily, Lasix 40 mg daily, spironolactone 12.5 mg daily -Avoiding SGLT2i due to obesity and advanced age with increased risk of UTI  PAF -She think she is normal sinus rhythm and has not really had any palpitations -Continue drug management with Xarelto 20 mg daily, carvedilol 3.125 mg twice daily, Tikosyn 500 mcg twice daily with as needed refills -Denies any bleeding problems on DOAC -Repeat  bmet, magnesium and CBC -followed in afib clinic      Medication Adjustments/Labs and Tests  Ordered: Current medicines are reviewed at length with the patient today.  Concerns regarding medicines are outlined above.   Tests Ordered: No orders of the defined types were placed in this encounter.   Medication Changes: No orders of the defined types were placed in this encounter.   Follow Up:  In Person in 1 year(s)  Signed, Karen Magic, MD  02/25/2023 1:11 PM    [facility]

## 2023-02-25 NOTE — Progress Notes (Unsigned)
Date:  02/25/2023   ID:  Karen Rasmussen, DOB [birth year], MRN [id number] The patient was identified using 2 identifiers. PCP:  Sigmund Hazel, MD   [facility] Providers Cardiologist:  Armanda Magic, MD     Evaluation Performed:  Follow-Up Visit  Chief Complaint:  OSA, HTN, pericardial effusion, atypical CP  History of Present Illness:    Karen Rasmussen is a 78 y.o. female with history of normal coronary arteries with calcium score of 0 on coronary CTA in 09/2016, chronic diastolic CHF/non-ischemic cardiomyopathy with EF of 50-55%, paroxysmal atrial fibrillation s/p DCCV x2 on Xarelto, obstructive sleep apnea on CPAP, hypertension, diabetes mellitus, and morbid obesity. She had a reoccurrence of her afib and was seen in afib clinic and her Betapace was switched to Tikosyn. She uses a Naval architect and has not had any further PAF.   She is here today for followup and is doing well.  She denies any chest pain or pressure, SOB, DOE, PND, orthopnea, LE edema, dizziness, palpitations or syncope. She is compliant with her meds and is tolerating meds with no SE.    She is doing well with her PAP device and thinks that she has gotten used to it.  She tolerates the mask and feels the pressure is adequate.  Since going on PAP she feels rested in the am and has no significant daytime sleepiness.  She denies any significant mouth or nasal dryness or nasal congestion.  She does not think that he snores.    Past Medical History:  Diagnosis Date   Abdominal pain, left lower quadrant    Asthmatic bronchitis    Benign essential HTN 04/26/2016   Bursitis of hip    Chronic diastolic CHF (congestive heart failure) (HCC)    DCM (dilated cardiomyopathy) (HCC)    EF 50-55% in Jan 2023   GERD (gastroesophageal reflux disease)    High cholesterol    History of kidney stones    Hypersomnia    Morbid obesity with BMI of 50.0-59.9, adult (HCC)    OSA on CPAP    Osteoarthritis    "right hip; both  knees" (09/21/2016)   Persistent atrial fibrillation (HCC) 04/26/2016   Type II diabetes mellitus (HCC)    Vitamin D deficiency disease    Past Surgical History:  Procedure Laterality Date   ABDOMINAL HYSTERECTOMY     APPENDECTOMY     BALLOON DILATION N/A 11/17/2017   Procedure: BALLOON DILATION;  Surgeon: Kerin Salen, MD;  Location: [facility];  Service: Gastroenterology;  Laterality: N/A;   BIOPSY  11/17/2017   Procedure: BIOPSY;  Surgeon: Kerin Salen, MD;  Location: [facility];  Service: Gastroenterology;;   BIOPSY  07/13/2022   Procedure: BIOPSY;  Surgeon: Kerin Salen, MD;  Location: [facility];  Service: Gastroenterology;;   BOTOX INJECTION  07/13/2022   Procedure: BOTOX INJECTION;  Surgeon: Kerin Salen, MD;  Location: [facility];  Service: Gastroenterology;;   CARDIAC CATHETERIZATION     CARDIOVERSION N/A 06/11/2016   Procedure: CARDIOVERSION;  Surgeon: Lars Masson, MD;  Location: [facility] ENDOSCOPY;  Service: Cardiovascular;  Laterality: N/A;   CARDIOVERSION N/A 07/16/2016   Procedure: CARDIOVERSION;  Surgeon: Elease Hashimoto Deloris Ping, MD;  Location: [facility] ENDOSCOPY;  Service: Cardiovascular;  Laterality: N/A;   CARDIOVERSION N/A 09/23/2016   Procedure: CARDIOVERSION;  Surgeon: Thurmon Fair, MD;  Location: [facility] ENDOSCOPY;  Service: Cardiovascular;  Laterality: N/A;   CARDIOVERSION N/A 06/03/2020   Procedure: CARDIOVERSION;  Surgeon: Little Ishikawa, MD;  Location:  [facility] ENDOSCOPY;  Service: Cardiovascular;  Laterality: N/A;   CHOLECYSTECTOMY OPEN     COLONOSCOPY N/A 11/17/2017   Procedure: COLONOSCOPY;  Surgeon: Kerin Salen, MD;  Location: [facility];  Service: Gastroenterology;  Laterality: N/A;   cortisone injection to right knee     costisone injection to left knee  11/03/2017   CYSTOSCOPY WITH RETROGRADE PYELOGRAM, URETEROSCOPY AND STENT PLACEMENT Left 01/14/2019   Procedure: CYSTOSCOPy  STENT PLACEMENT;  Surgeon: Noel Christmas, MD;  Location: [facility];  Service:  Urology;  Laterality: Left;   CYSTOSCOPY WITH RETROGRADE PYELOGRAM, URETEROSCOPY AND STENT PLACEMENT Left 03/06/2019   Procedure: CYSTOSCOPY WITH RETROGRADE PYELOGRAM, URETEROSCOPY AND STENT PLACEMENT;  Surgeon: Noel Christmas, MD;  Location: [facility];  Service: Urology;  Laterality: Left;  90 MINS   CYSTOSCOPY WITH STENT PLACEMENT Left 03/07/2019   Procedure: CYSTOSCOPY WITH , URETEROSCOPY/ STENT PLACEMENT;  Surgeon: Crist Fat, MD;  Location: [facility];  Service: Urology;  Laterality: Left;   DILATION AND CURETTAGE OF UTERUS     S/P miscarriage   ESOPHAGEAL MANOMETRY N/A 08/25/2022   Procedure: ESOPHAGEAL MANOMETRY (EM);  Surgeon: Charlott Rakes, MD;  Location: [facility];  Service: Gastroenterology;  Laterality: N/A;   ESOPHAGOGASTRODUODENOSCOPY N/A 11/17/2017   Procedure: ESOPHAGOGASTRODUODENOSCOPY (EGD);  Surgeon: Kerin Salen, MD;  Location: [facility];  Service: Gastroenterology;  Laterality: N/A;   ESOPHAGOGASTRODUODENOSCOPY (EGD) WITH PROPOFOL N/A 07/13/2022   Procedure: ESOPHAGOGASTRODUODENOSCOPY (EGD) WITH PROPOFOL;  Surgeon: Kerin Salen, MD;  [facility]: [facility];  Service: Gastroenterology;  Laterality: N/A;   HOLMIUM LASER APPLICATION Left 03/06/2019   Procedure: HOLMIUM LASER APPLICATION;  Surgeon: Noel Christmas, MD;  Location: [facility];  Service: Urology;  Laterality: Left;   implantable loop recorder placement  10/20/2020   Medtronic Reveal Linq model LNQ 11 (SN [id number]) implantable loop recorder implanted for Alleviate HF trial   NASAL SEPTUM SURGERY     POLYPECTOMY  11/17/2017   Procedure: POLYPECTOMY;  Surgeon: Kerin Salen, MD;  Location: [facility];  Service: Gastroenterology;;   POLYPECTOMY  07/13/2022   Procedure: POLYPECTOMY;  Surgeon: Kerin Salen, MD;  Location: [facility];  Service: Gastroenterology;;   TONSILLECTOMY       Current Meds  Medication Sig   alendronate (FOSAMAX) 70 MG tablet Take 70 mg by mouth every Sunday. Take with a full glass  of water on an empty stomach.   betamethasone dipropionate 0.05 % cream Apply 1 application  topically 2 (two) times daily as needed (skin irritation.).   Biotin w/ Vitamins C & E (HAIR SKIN & NAILS GUMMIES PO) Take 2 tablets by mouth daily.    blood glucose meter kit and supplies Dispense based on patient and insurance preference. Use up to four times daily as directed. (FOR ICD-10 E10.9, E11.9).   Calcium-Vitamin D-Vitamin K (CALCIUM + D + K PO) Take 1 tablet by mouth in the morning.   carvedilol (COREG) 3.125 MG tablet Take 1 tablet (3.125 mg total) by mouth 2 (two) times daily with a meal.   Cholecalciferol (VITAMIN D-3) 125 MCG (5000 UT) TABS Take 5,000 Units by mouth in the morning.   colchicine 0.6 MG tablet Take 0.5 tablets (0.3 mg total) by mouth daily.   dofetilide (TIKOSYN) 500 MCG capsule Take 1 capsule (500 mcg total) by mouth 2 (two) times daily.   fluocinolone (SYNALAR) 0.01 % external solution Apply 1 application topically 2 (two) times daily as needed (skin irritation).   FLUoxetine (PROZAC) 20 MG capsule Take 20 mg by mouth daily.  folic acid (FOLVITE) 800 MCG tablet Take 800 mcg by mouth in the morning.   furosemide (LASIX) 40 MG tablet Take 1 tablet (40 MG) daily. As needed patient may take 40 MG additional Lasix PRN by mouth daily x 4 days as directed per Alleviate Research HF Study PRN plan.   gabapentin (NEURONTIN) 300 MG capsule Take 300 mg by mouth at bedtime.   HYDROcodone bit-homatropine (HYCODAN) 5-1.5 MG/5ML syrup Take 5 mLs by mouth daily as needed for cough.   HYDROcodone-acetaminophen (NORCO) 5-325 MG tablet Take 1 tablet by mouth every 4 (four) hours as needed for moderate pain (kidney stone pain). (Patient taking differently: Take 1 tablet by mouth at bedtime.)   ibuprofen (ADVIL) 600 MG tablet Take 1 tablet (600 mg total) by mouth 3 (three) times daily.   Insulin Glargine (BASAGLAR KWIKPEN [place]) Inject 40 Units into the skin in the morning.   Multiple  Vitamins-Minerals (CENTRUM SILVER) CHEW Chew 2 tablets by mouth daily. soft chews   pantoprazole (PROTONIX) 40 MG tablet Take 40 mg by mouth 2 (two) times daily.   Polyethyl Glycol-Propyl Glycol (SYSTANE) 0.4-0.3 % SOLN Place 1-2 drops into both eyes 3 (three) times daily as needed (dry/irritated eyes).   potassium chloride (KLOR-CON M10) 10 MEQ tablet TAKE 2 TABLETS BY MOUTH TWICE A DAY   psyllium (METAMUCIL SMOOTH TEXTURE) 28 % packet Take 1 packet by mouth daily as needed (regularity).   Semaglutide,0.25 or 0.5MG /DOS, (OZEMPIC, 0.25 OR 0.5 MG/DOSE,) 2 MG/3ML SOPN Inject 1 mg into the skin every Saturday.   simvastatin (ZOCOR) 10 MG tablet Take 1 tablet (10 mg total) by mouth at bedtime.   sodium chloride (OCEAN) 0.65 % SOLN nasal spray Place 2 sprays into both nostrils in the morning and at bedtime.   spironolactone (ALDACTONE) 25 MG tablet TAKE 1/2 TABLET (12.5MG  TOTAL) DAILY   triamcinolone cream (KENALOG) 0.1 % Apply 1 application topically 2 (two) times daily as needed (itching legs).    XARELTO 20 MG TABS tablet Take 1 tablet (20 mg total) by mouth daily.     Allergies:   Diphenhydramine and No healthtouch food allergies   Social History   Tobacco Use   Smoking status: Never   Smokeless tobacco: Never   Tobacco comments:    Never smoke 05/19/21  Vaping Use   Vaping status: Never Used  Substance Use Topics   Alcohol use: Not Currently   Drug use: No     Family Hx: The patient's family history includes CAD in her mother; Cancer in her father; Diabetes in her brother and father; Heart failure in her father; Hypertension in her brother. There is no history of Breast cancer.  ROS:   Please see the history of present illness.     All other systems reviewed and are negative.   Prior CV studies:   The following studies were reviewed today:  PAP compliance download  Labs/Other Tests and Data Reviewed:    EKG:  No ECG reviewed.  Recent Labs: 06/01/2022: TSH  2.379 06/24/2022: Hemoglobin 12.6; Platelets 257 12/01/2022: BUN 14; Creatinine, Ser 0.88; Magnesium 2.2; Potassium 4.2; Sodium 139   Recent Lipid Panel No results found for: "CHOL", "TRIG", "HDL", "CHOLHDL", "LDLCALC", "LDLDIRECT"  Wt Readings from Last 3 Encounters:  02/25/23 259 lb (117.5 kg)  02/25/23 259 lb 12.8 oz (117.8 kg)  12/01/22 263 lb 6.4 oz (119.5 kg)     Risk Assessment/Calculations:    CHA2DS2-VASc Score = 5   This indicates a 7.2% annual risk  of stroke. The patient's score is based upon: CHF History: 1 HTN History: 1 Diabetes History: 0 Stroke History: 0 Vascular Disease History: 0 Age Score: 2 Gender Score: 1   {This patient has a significant risk of stroke if diagnosed with atrial fibrillation.  Please consider VKA or DOAC agent for anticoagulation if the bleeding risk is acceptable.   You can also use the SmartPhrase .[email or web address] for documentation.   :[id number]}      Objective:    Vital Signs:  BP 126/62   Pulse 68   Ht 5' 1.5" (1.562 m)   Wt 259 lb (117.5 kg)   BMI 48.15 kg/m    VITAL SIGNS:  reviewed GEN:  no acute distress EYES:  sclerae anicteric, EOMI - Extraocular Movements Intact RESPIRATORY:  normal respiratory effort, symmetric expansion CARDIOVASCULAR:  no peripheral edema SKIN:  no rash, lesions or ulcers. MUSCULOSKELETAL:  no obvious deformities. NEURO:  alert and oriented x 3, no obvious focal deficit PSYCH:  normal affect  ASSESSMENT & PLAN:    OSA - The patient is tolerating PAP therapy well without any problems. The PAP download performed by his DME was personally reviewed and interpreted by me today and showed an AHI of 0.6 /hr on auto CPAP from 4-18 cm H2O with 100% compliance in using more than 4 hours nightly.  The patient has been using and benefiting from PAP use and will continue to benefit from therapy.   Essential hypertension  -BP has been controlled at home -Continue prescription drug management with carvedilol  3.125 mg twice daily and spironolactone 12.5 mg daily with as needed refills   Pericardial effusion -stable small effusion on echo 03/2021 -Echo 6/24 EF 55 to 60% with small pericardial effusion -Repeat  echo 07/29/2023   Atypical chest pain  -Her chest pain in the past has been atypical being sharp and nonexertional -she had a normal coronary CTA in 2018 with coronary Ca score  0 and I do not think that her CP is related to coronary ischemia -she has a hx of esophageal dilatation and has felt that her chest pain has been GI in the past -She has had a small pericardial effusion on echo and we tried her on colchicine but she decided not to take it   Chronic LE edema/Chronic diastolic CHF -She does not appear volume overloaded on exam today -Continue prescription drug management with carvedilol 3.25 mg twice daily, Lasix 40 mg daily, spironolactone 12.5 mg daily -Avoiding SGLT2i due to obesity and advanced age with increased risk of UTI  PAF -She think she is normal sinus rhythm and has not really had any palpitations -Continue drug management with Xarelto 20 mg daily, carvedilol 3.125 mg twice daily, Tikosyn 500 mcg twice daily with as needed refills -Denies any bleeding problems on DOAC -Repeat bmet, magnesium and CBC -followed in afib clinic     Time:   Today, I have spent 15 minutes with the patient with telehealth technology discussing the above problems.     Medication Adjustments/Labs and Tests Ordered: Current medicines are reviewed at length with the patient today.  Concerns regarding medicines are outlined above.   Tests Ordered: No orders of the defined types were placed in this encounter.   Medication Changes: No orders of the defined types were placed in this encounter.   Follow Up:  In Person in 1 year(s)  Signed, Armanda Magic, MD  02/25/2023 1:00 PM    [facility]

## 2023-02-26 LAB — MAGNESIUM: Magnesium: 2.2 mg/dL (ref 1.6–2.3)

## 2023-02-26 LAB — CBC
Hematocrit: 43 % (ref 34.0–46.6)
Hemoglobin: 13.7 g/dL (ref 11.1–15.9)
MCH: 27 pg (ref 26.6–33.0)
MCHC: 31.9 g/dL (ref 31.5–35.7)
MCV: 85 fL (ref 79–97)
Platelets: 289 10*3/uL (ref 150–450)
RBC: 5.07 x10E6/uL (ref 3.77–5.28)
RDW: 13.7 % (ref 11.7–15.4)
WBC: 8.6 10*3/uL (ref 3.4–10.8)

## 2023-02-26 LAB — COMPREHENSIVE METABOLIC PANEL
ALT: 16 IU/L (ref 0–32)
AST: 19 IU/L (ref 0–40)
Albumin: 4.6 g/dL (ref 3.8–4.8)
Alkaline Phosphatase: 96 IU/L (ref 44–121)
BUN/Creatinine Ratio: 8 — ABNORMAL LOW (ref 12–28)
BUN: 9 mg/dL (ref 8–27)
Bilirubin Total: 0.7 mg/dL (ref 0.0–1.2)
CO2: 24 mmol/L (ref 20–29)
Calcium: 9.7 mg/dL (ref 8.7–10.3)
Chloride: 99 mmol/L (ref 96–106)
Creatinine, Ser: 1.15 mg/dL — ABNORMAL HIGH (ref 0.57–1.00)
Globulin, Total: 2.3 g/dL (ref 1.5–4.5)
Glucose: 130 mg/dL — ABNORMAL HIGH (ref 70–99)
Potassium: 4 mmol/L (ref 3.5–5.2)
Sodium: 142 mmol/L (ref 134–144)
Total Protein: 6.9 g/dL (ref 6.0–8.5)
eGFR: 50 mL/min/{1.73_m2} — ABNORMAL LOW (ref >59)

## 2023-03-07 ENCOUNTER — Telehealth: Payer: Self-pay

## 2023-03-07 DIAGNOSIS — I10 Essential (primary) hypertension: Secondary | ICD-10-CM

## 2023-03-07 DIAGNOSIS — I5032 Chronic diastolic (congestive) heart failure: Secondary | ICD-10-CM

## 2023-03-07 DIAGNOSIS — Z79899 Other long term (current) drug therapy: Secondary | ICD-10-CM

## 2023-03-07 NOTE — Telephone Encounter (Signed)
Call to patient to discuss lab results. Patient verbalizes understanding that Cr is slightly elevated. She is not sure when she stopped taking the increased dose of lasix. She knows she was back on 40 mg daily when she saw Dr. Mayford Knife on 02/25/23, but she cannot remember if she took the extra doses of lasix for 4 days before or after 02/18/23.   She reports her leg swelling is improved and denies any SOB. Patient responses forwarded to Dr. Mayford Knife.

## 2023-03-07 NOTE — Telephone Encounter (Signed)
-----   Message from Armanda Magic sent at 02/26/2023  3:16 PM EST ----- Please verify when she stopped the lasix 40mg  BID and what dose she is on now

## 2023-03-08 NOTE — Telephone Encounter (Signed)
Call to patient to provide f/u instructions. No answer, left detailed message per DPR advising patient Dr. Mayford Knife requests she complete a BMET in one week. Advised orders would be sent to [facility] and she can perform these nonfasting labs at any [facility] of her choosing. Asked patient to call our office if any questions.

## 2023-03-08 NOTE — Addendum Note (Signed)
Addended by: Luellen Pucker on: 03/08/2023 02:26 PM   Modules accepted: Orders

## 2023-03-09 ENCOUNTER — Telehealth: Payer: Self-pay | Admitting: Cardiology

## 2023-03-09 DIAGNOSIS — M1711 Unilateral primary osteoarthritis, right knee: Secondary | ICD-10-CM | POA: Diagnosis not present

## 2023-03-09 DIAGNOSIS — M65331 Trigger finger, right middle finger: Secondary | ICD-10-CM | POA: Diagnosis not present

## 2023-03-09 DIAGNOSIS — M1712 Unilateral primary osteoarthritis, left knee: Secondary | ICD-10-CM | POA: Diagnosis not present

## 2023-03-09 DIAGNOSIS — M170 Bilateral primary osteoarthritis of knee: Secondary | ICD-10-CM | POA: Diagnosis not present

## 2023-03-09 NOTE — Telephone Encounter (Signed)
Spoke with patient and she states she recioeved 2 cortisone injections in her knees and 1 in her finger. She would like to know will it be safe with her cardiac medications to continue cortisone injections? She also states ortho gave her a gel injection option if  she can not do cortisone anymore.

## 2023-03-09 NOTE — Telephone Encounter (Signed)
Patient is requesting call back to discuss knee injections she use to have done in 2021 and would like to discuss if this will be okay to continue prior to exercising. Please advise.

## 2023-03-10 NOTE — Telephone Encounter (Signed)
Call to patient who verbalizes understanding to repeat BMET on 04/04/23.

## 2023-03-10 NOTE — Telephone Encounter (Signed)
Returned patient's call, patient states already spoke to her orthopedist and her PCP and she thinks she will stick to gel injections from now on. She denies any SOB or palpitations, she states her BP was 138/79 and HR was 76 today. Advised patient to call our office if she experiences any change to her symptoms.

## 2023-03-10 NOTE — Telephone Encounter (Signed)
Patient is following up with updates. She states last night she developed a terrible headache and this morning her blood sugar is elevated at 240. She states her head still hurts and assumes symptoms are related to steroid injection. She has concerns that it may trigger afib as well. Please advise.

## 2023-03-11 DIAGNOSIS — F332 Major depressive disorder, recurrent severe without psychotic features: Secondary | ICD-10-CM | POA: Diagnosis not present

## 2023-03-15 ENCOUNTER — Telehealth: Payer: Self-pay | Admitting: Cardiology

## 2023-03-15 DIAGNOSIS — I5032 Chronic diastolic (congestive) heart failure: Secondary | ICD-10-CM | POA: Diagnosis not present

## 2023-03-15 DIAGNOSIS — Z79899 Other long term (current) drug therapy: Secondary | ICD-10-CM | POA: Diagnosis not present

## 2023-03-15 DIAGNOSIS — I10 Essential (primary) hypertension: Secondary | ICD-10-CM | POA: Diagnosis not present

## 2023-03-15 NOTE — Telephone Encounter (Signed)
Patient wants to know if Dr. Mayford Knife will clear her to have a POEM Endoscopic Myotomy.  Patient noted she has an appointment scheduled with her [facility] on 3/7.

## 2023-03-16 LAB — BASIC METABOLIC PANEL WITH GFR
BUN/Creatinine Ratio: 13 (ref 12–28)
BUN: 13 mg/dL (ref 8–27)
CO2: 24 mmol/L (ref 20–29)
Calcium: 9.2 mg/dL (ref 8.7–10.3)
Chloride: 100 mmol/L (ref 96–106)
Creatinine, Ser: 0.99 mg/dL (ref 0.57–1.00)
Glucose: 155 mg/dL — ABNORMAL HIGH (ref 70–99)
Potassium: 4.3 mmol/L (ref 3.5–5.2)
Sodium: 140 mmol/L (ref 134–144)
eGFR: 59 mL/min/1.73 — ABNORMAL LOW

## 2023-03-17 ENCOUNTER — Telehealth: Payer: Self-pay | Admitting: Cardiology

## 2023-03-17 NOTE — Telephone Encounter (Signed)
 Patient states that right now she is prescribed to take and extra 40 mg of Lasix  or 4 days if she accumulates fluid. She states when she did this several weeks ago it caused her kidney function to decline. She is in the alleviate study and they are the ones that notify her when she needs to increase her Lasix . She is concerned that taking 80 mg for 4 days is too much. She would like to know if Dr. Shlomo is okay with her taking that much when she is accumulating fluid or if she should go down to only taking an extra 20 mg for a total of 60 mg for 4 days when she needs to. She was advised by the alleviate team that this was up to Dr. Shlomo.   Patient also has questions about a possible upcoming procedure that there is another phone note about. She has a consultation with the surgeon on 3/7 and I advised her to have them reach out to us  after her consultation if they are needing cardiac clearance.

## 2023-03-17 NOTE — Telephone Encounter (Signed)
 Patient called and wanted to know what is the maximum dosage of Lasix  she can take without it affecting her kidneys

## 2023-03-18 NOTE — Telephone Encounter (Signed)
 Will send a message to the pt thru MY CHART we will need clearance request to be faxed to our office [phone].

## 2023-03-18 NOTE — Telephone Encounter (Signed)
 Preoperative team, please contact patient and let her know that we will need preoperative cardiac clearance request from office/surgery center/surgeon who is completing surgery.  Once we are able to review preoperative request we will be able to provide recommendations.  Thank you.  Josefa HERO. Rickie Gutierres NP-C     03/18/2023, 10:57 AM [facility] [address] Office [phone] Fax [phone]

## 2023-03-23 NOTE — Telephone Encounter (Signed)
Spoke with the patient and advised on recommendations from Dr. Mayford Knife. Patient verbalized understanding.

## 2023-03-24 ENCOUNTER — Telehealth: Payer: Self-pay | Admitting: Cardiology

## 2023-03-24 MED ORDER — POTASSIUM CHLORIDE CRYS ER 10 MEQ PO TBCR: EXTENDED_RELEASE_TABLET | ORAL | 3 refills | Status: DC

## 2023-03-24 NOTE — Telephone Encounter (Signed)
*  STAT* If patient is at the pharmacy, call can be transferred to refill team.   1. Which medications need to be refilled? (please list name of each medication and dose if known)  new prescription for Klor-Con M 10   2. Would you like to learn more about the convenience, safety, & potential cost savings by using the [facility]?      3. Are you open to using the [facility] (Type [facility].   4. Which pharmacy/location (including street and city if local pharmacy) is medication to be sent to?[facility] [address], ,[place]   5. Do they need a 30 day or 90 day supply? 90 days # 360 and refills

## 2023-03-28 DIAGNOSIS — M1711 Unilateral primary osteoarthritis, right knee: Secondary | ICD-10-CM | POA: Diagnosis not present

## 2023-03-29 DIAGNOSIS — M1712 Unilateral primary osteoarthritis, left knee: Secondary | ICD-10-CM | POA: Diagnosis not present

## 2023-04-04 DIAGNOSIS — M1711 Unilateral primary osteoarthritis, right knee: Secondary | ICD-10-CM | POA: Diagnosis not present

## 2023-04-06 DIAGNOSIS — M1712 Unilateral primary osteoarthritis, left knee: Secondary | ICD-10-CM | POA: Diagnosis not present

## 2023-04-12 ENCOUNTER — Other Ambulatory Visit: Payer: Medicare Other

## 2023-04-12 ENCOUNTER — Ambulatory Visit: Payer: Medicare Other

## 2023-04-14 ENCOUNTER — Other Ambulatory Visit: Payer: Self-pay | Admitting: Cardiology

## 2023-04-15 DIAGNOSIS — T402X5A Adverse effect of other opioids, initial encounter: Secondary | ICD-10-CM | POA: Diagnosis not present

## 2023-04-15 DIAGNOSIS — K220 Achalasia of cardia: Secondary | ICD-10-CM | POA: Diagnosis not present

## 2023-04-19 ENCOUNTER — Other Ambulatory Visit: Payer: Self-pay | Admitting: Gastroenterology

## 2023-04-19 DIAGNOSIS — K220 Achalasia of cardia: Secondary | ICD-10-CM

## 2023-04-19 DIAGNOSIS — Z006 Encounter for examination for normal comparison and control in clinical research program: Secondary | ICD-10-CM

## 2023-04-19 NOTE — Research (Signed)
 Alleviate HF Research Study  30 Month  No adverse events or cardiovascular medication changes to report at this time.   Current Outpatient Medications:    alendronate (FOSAMAX) 70 MG tablet, Take 70 mg by mouth every Sunday. Take with a full glass of water on an empty stomach., Disp: , Rfl:    betamethasone dipropionate 0.05 % cream, Apply 1 application  topically 2 (two) times daily as needed (skin irritation.)., Disp: , Rfl:    Biotin w/ Vitamins C & E (HAIR SKIN & NAILS GUMMIES PO), Take 2 tablets by mouth daily. , Disp: , Rfl:    blood glucose meter kit and supplies, Dispense based on patient and insurance preference. Use up to four times daily as directed. (FOR ICD-10 E10.9, E11.9)., Disp: 1 each, Rfl: 0   Calcium-Vitamin D-Vitamin K (CALCIUM + D + K PO), Take 1 tablet by mouth in the morning., Disp: , Rfl:    carvedilol (COREG) 3.125 MG tablet, Take 1 tablet (3.125 mg total) by mouth 2 (two) times daily with a meal., Disp: 180 tablet, Rfl: 2   Cholecalciferol (VITAMIN D-3) 125 MCG (5000 UT) TABS, Take 5,000 Units by mouth in the morning., Disp: , Rfl:    dofetilide (TIKOSYN) 500 MCG capsule, Take 1 capsule (500 mcg total) by mouth 2 (two) times daily., Disp: 180 capsule, Rfl: 2   fluocinolone (SYNALAR) 0.01 % external solution, Apply 1 application topically 2 (two) times daily as needed (skin irritation)., Disp: , Rfl:    FLUoxetine (PROZAC) 20 MG capsule, Take 20 mg by mouth daily., Disp: , Rfl:    folic acid (FOLVITE) 800 MCG tablet, Take 800 mcg by mouth in the morning., Disp: , Rfl:    furosemide (LASIX) 40 MG tablet, Take 1 tablet (40 MG) daily. As needed patient may take 40 MG additional Lasix PRN by mouth daily x 4 days as directed per Alleviate Research HF Study PRN plan., Disp: 180 tablet, Rfl: 2   gabapentin (NEURONTIN) 300 MG capsule, Take 300 mg by mouth at bedtime., Disp: , Rfl:    HYDROcodone bit-homatropine (HYCODAN) 5-1.5 MG/5ML syrup, Take 5 mLs by mouth daily as needed  for cough., Disp: , Rfl:    HYDROcodone-acetaminophen (NORCO) 5-325 MG tablet, Take 1 tablet by mouth every 4 (four) hours as needed for moderate pain (kidney stone pain). (Patient taking differently: Take 1 tablet by mouth at bedtime.), Disp: 20 tablet, Rfl: 0   ibuprofen (ADVIL) 600 MG tablet, Take 1 tablet (600 mg total) by mouth 3 (three) times daily., Disp: 42 tablet, Rfl: 0   Insulin Glargine (BASAGLAR KWIKPEN [place]), Inject 40 Units into the skin in the morning., Disp: , Rfl:    Insulin Pen Needle 31G X 5 MM MISC, 12 Units by Does not apply route 3 (three) times daily., Disp: 100 each, Rfl: 0   Lancets (ONETOUCH DELICA PLUS LANCET33G) MISC, Apply topically 3 (three) times daily., Disp: , Rfl:    Misc Natural Products (GLUCOSAMINE CHONDROITIN TRIPLE) TABS, Take 2 tablets by mouth daily.  (Patient not taking: Reported on 02/25/2023), Disp: , Rfl:    Multiple Vitamins-Minerals (CENTRUM SILVER) CHEW, Chew 2 tablets by mouth daily. soft chews, Disp: , Rfl:    ONETOUCH VERIO test strip, SMARTSIG:Via Meter 3-4 Times Daily PRN, Disp: , Rfl:    pantoprazole (PROTONIX) 40 MG tablet, Take 40 mg by mouth 2 (two) times daily., Disp: , Rfl:    Polyethyl Glycol-Propyl Glycol (SYSTANE) 0.4-0.3 % SOLN, Place 1-2 drops into both eyes  3 (three) times daily as needed (dry/irritated eyes)., Disp: , Rfl:    potassium chloride (KLOR-CON M10) 10 MEQ tablet, TAKE 2 TABLETS BY MOUTH TWICE A DAY, Disp: 360 tablet, Rfl: 3   psyllium (METAMUCIL SMOOTH TEXTURE) 28 % packet, Take 1 packet by mouth daily as needed (regularity)., Disp: , Rfl:    Semaglutide,0.25 or 0.5MG /DOS, (OZEMPIC, 0.25 OR 0.5 MG/DOSE,) 2 MG/3ML SOPN, Inject 1 mg into the skin every Saturday., Disp: , Rfl:    simvastatin (ZOCOR) 10 MG tablet, Take 1 tablet (10 mg total) by mouth at bedtime., Disp: 90 tablet, Rfl: 1   sodium chloride (OCEAN) 0.65 % SOLN nasal spray, Place 2 sprays into both nostrils in the morning and at bedtime., Disp: 66 mL, Rfl: 3    spironolactone (ALDACTONE) 25 MG tablet, TAKE 1/2 TABLET (12.5MG  TOTAL) DAILY, Disp: 45 tablet, Rfl: 2   triamcinolone cream (KENALOG) 0.1 %, Apply 1 application topically 2 (two) times daily as needed (itching legs). , Disp: , Rfl:    XARELTO 20 MG TABS tablet, Take 1 tablet (20 mg total) by mouth daily., Disp: 90 tablet, Rfl: 1

## 2023-04-26 DIAGNOSIS — K220 Achalasia of cardia: Secondary | ICD-10-CM | POA: Diagnosis not present

## 2023-04-26 DIAGNOSIS — Z006 Encounter for examination for normal comparison and control in clinical research program: Secondary | ICD-10-CM

## 2023-05-03 ENCOUNTER — Telehealth: Payer: Self-pay | Admitting: Cardiology

## 2023-05-03 ENCOUNTER — Telehealth: Payer: Self-pay | Admitting: *Deleted

## 2023-05-03 NOTE — Telephone Encounter (Signed)
 Patient with diagnosis of A Fib on Xarelto for anticoagulation.    Procedure: colonscopy and EGD Date of procedure: TBD   CHA2DS2-VASc Score = 5  This indicates a 7.2% annual risk of stroke. The patient's score is based upon: CHF History: 1 HTN History: 1 Diabetes History: 0 Stroke History: 0 Vascular Disease History: 0 Age Score: 2 Gender Score: 1   CrCl 59 ml/min using adj body weight Platelet count 289K  Per office protocol, patient can hold Xarelto for 2 days prior to procedure.   **This guidance is not considered finalized until pre-operative APP has relayed final recommendations.**

## 2023-05-03 NOTE — Telephone Encounter (Signed)
 Please advise holding Xarelto prior to colonoscopy and EGD.   Thank you!  DW

## 2023-05-03 NOTE — Telephone Encounter (Signed)
 Pt has been scheduled tele preop appt 07/08/23. Med rec and consent are done.

## 2023-05-03 NOTE — Telephone Encounter (Signed)
 Patient says [facility] sent a clearance request to the office on 3/20 and patient would like to know if it has been received + updates. If not received, she's wanting to know if someone can contact them directly at [phone] (option 1 then option 2, per patient). 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 She says she hasn't been able to eat or swallow non-liquid foods without it getting stuck in her esophagus. She says gastrology is wanting to do an endoscopic procedure, possibly involving botox in neck. She says they were previously booked out to June, but as of today, the soonest they can get her in would be around the end of July. Please help.

## 2023-05-03 NOTE — Telephone Encounter (Signed)
   Name: Karen Rasmussen  DOB: Jul 08, 1945  MRN: [id number]  Primary Cardiologist: Armanda Magic, MD   Preoperative team, please contact this patient and set up a phone call appointment for further preoperative risk assessment. Please obtain consent and complete medication review. Thank you for your help.  I confirm that guidance regarding antiplatelet and oral anticoagulation therapy has been completed and, if necessary, noted below.  Per Pharm D, patient may hold Xarelto for 2 days prior to procedure.    I also confirmed the patient resides in the state of [place]. As per [employer] Medical Board telemedicine laws, the patient must reside in the state in which the provider is licensed.   Carlos Levering, NP 05/03/2023, 2:52 PM [facility]

## 2023-05-03 NOTE — Telephone Encounter (Signed)
 Pt has been scheduled tele preop appt 07/08/23. Med rec and consent are done.     Patient Consent for Virtual Visit        Karen Rasmussen has provided verbal consent on 05/03/2023 for a virtual visit (video or telephone).   CONSENT FOR VIRTUAL VISIT FOR:  Karen Rasmussen  By participating in this virtual visit I agree to the following:  I hereby voluntarily request, consent and authorize [facility] and its employed or contracted physicians, physician assistants, nurse practitioners or other licensed health care professionals (the Practitioner), to provide me with telemedicine health care services (the "Services") as deemed necessary by the treating Practitioner. I acknowledge and consent to receive the Services by the Practitioner via telemedicine. I understand that the telemedicine visit will involve communicating with the Practitioner through live audiovisual communication technology and the disclosure of certain medical information by electronic transmission. I acknowledge that I have been given the opportunity to request an in-person assessment or other available alternative prior to the telemedicine visit and am voluntarily participating in the telemedicine visit.  I understand that I have the right to withhold or withdraw my consent to the use of telemedicine in the course of my care at any time, without affecting my right to future care or treatment, and that the Practitioner or I may terminate the telemedicine visit at any time. I understand that I have the right to inspect all information obtained and/or recorded in the course of the telemedicine visit and may receive copies of available information for a reasonable fee.  I understand that some of the potential risks of receiving the Services via telemedicine include:  Delay or interruption in medical evaluation due to technological equipment failure or disruption; Information transmitted may not be sufficient (e.g. poor resolution of  images) to allow for appropriate medical decision making by the Practitioner; and/or  In rare instances, security protocols could fail, causing a breach of personal health information.  Furthermore, I acknowledge that it is my responsibility to provide information about my medical history, conditions and care that is complete and accurate to the best of my ability. I acknowledge that Practitioner's advice, recommendations, and/or decision may be based on factors not within their control, such as incomplete or inaccurate data provided by me or distortions of diagnostic images or specimens that may result from electronic transmissions. I understand that the practice of medicine is not an exact science and that Practitioner makes no warranties or guarantees regarding treatment outcomes. I acknowledge that a copy of this consent can be made available to me via my patient portal [facility] MyChart), or I can request a printed copy by calling the office of [facility].    I understand that my insurance will be billed for this visit.   I have read or had this consent read to me. I understand the contents of this consent, which adequately explains the benefits and risks of the Services being provided via telemedicine.  I have been provided ample opportunity to ask questions regarding this consent and the Services and have had my questions answered to my satisfaction. I give my informed consent for the services to be provided through the use of telemedicine in my medical care

## 2023-05-03 NOTE — Telephone Encounter (Signed)
 I called [facility] at the # the pt provided. I see notes pt says request was sent to our office 04/28/23 however I do not see that we received a request.    I was able to obtain the needed information to proceed to preop APP for review and recommendations.       Pre-operative Risk Assessment    Patient Name: Karen Rasmussen  DOB: [birth year] MRN: [id number]   Date of last office visit: 02/25/23 DR. TURNER Date of next office visit: NONE   Request for Surgical Clearance    Procedure:   COLONOSCOPY AND EGD  Date of Surgery:  Clearance TBD                                Surgeon:  UNKNOWN AT THIS TIME PER SURGEON OFFICE Surgeon's Group or Practice Name:  [facility] Phone number:  [phone] Fax number:  [phone]   Type of Clearance Requested:   - Medical  - Pharmacy:  Hold Rivaroxaban (Xarelto)     Type of Anesthesia:   PROPOFOL   Additional requests/questions:    Elpidio Anis   05/03/2023, 12:00 PM

## 2023-05-03 NOTE — Telephone Encounter (Signed)
 Pre-op team,   I do not see an official request for cardiac clearance. It sounds as though patient has attempted to get GI office to send this in. Please see the attached information provided by the patient so we can get the official request in.   Thank you!  Etta Grandchild. Karlyn Glasco, DNP, NP-C  05/03/2023, 11:13 AM [facility] [address] Office [phone] Fax [phone]

## 2023-05-03 NOTE — Telephone Encounter (Signed)
 I assured the pt that I will update the surgeon as well. Pt states her procedure will not be most likely until July 2025. I explained to the pt that I cannot schedule the tele to early as we cannot have it more than 2 months out from procedure. I assured pt if surgery is moved up we can change her tele appt.

## 2023-05-05 NOTE — Telephone Encounter (Signed)
 Pt calling in to inform nurse that her surgery will be on 07/01/23 so she would need a different pre clearance phone call. Please advise

## 2023-05-05 NOTE — Telephone Encounter (Signed)
 Pt has been rescheduled for 06/16/23 10:00.

## 2023-06-01 ENCOUNTER — Other Ambulatory Visit (HOSPITAL_BASED_OUTPATIENT_CLINIC_OR_DEPARTMENT_OTHER): Payer: Self-pay | Admitting: Family Medicine

## 2023-06-01 ENCOUNTER — Ambulatory Visit (HOSPITAL_COMMUNITY)
Admission: RE | Admit: 2023-06-01 | Discharge: 2023-06-01 | Disposition: A | Discharge status: Home / Self Care | Payer: Medicare Other | Source: Ambulatory Visit | Attending: Physician Assistant | Admitting: Physician Assistant

## 2023-06-01 ENCOUNTER — Encounter (HOSPITAL_COMMUNITY): Payer: Self-pay | Admitting: Physician Assistant

## 2023-06-01 VITALS — BP 122/64 | HR 67 | Ht 61.5 in | Wt 247.2 lb

## 2023-06-01 DIAGNOSIS — Z6841 Body Mass Index (BMI) 40.0 and over, adult: Secondary | ICD-10-CM | POA: Diagnosis not present

## 2023-06-01 DIAGNOSIS — G4733 Obstructive sleep apnea (adult) (pediatric): Secondary | ICD-10-CM | POA: Insufficient documentation

## 2023-06-01 DIAGNOSIS — E669 Obesity, unspecified: Secondary | ICD-10-CM | POA: Insufficient documentation

## 2023-06-01 DIAGNOSIS — I4819 Other persistent atrial fibrillation: Secondary | ICD-10-CM

## 2023-06-01 DIAGNOSIS — Z5181 Encounter for therapeutic drug level monitoring: Secondary | ICD-10-CM | POA: Diagnosis not present

## 2023-06-01 DIAGNOSIS — Z7985 Long-term (current) use of injectable non-insulin antidiabetic drugs: Secondary | ICD-10-CM | POA: Insufficient documentation

## 2023-06-01 DIAGNOSIS — Z006 Encounter for examination for normal comparison and control in clinical research program: Secondary | ICD-10-CM | POA: Diagnosis not present

## 2023-06-01 DIAGNOSIS — Z79899 Other long term (current) drug therapy: Secondary | ICD-10-CM | POA: Diagnosis not present

## 2023-06-01 DIAGNOSIS — I5032 Chronic diastolic (congestive) heart failure: Secondary | ICD-10-CM | POA: Insufficient documentation

## 2023-06-01 DIAGNOSIS — Z7901 Long term (current) use of anticoagulants: Secondary | ICD-10-CM | POA: Insufficient documentation

## 2023-06-01 DIAGNOSIS — D6869 Other thrombophilia: Secondary | ICD-10-CM

## 2023-06-01 DIAGNOSIS — Z794 Long term (current) use of insulin: Secondary | ICD-10-CM | POA: Insufficient documentation

## 2023-06-01 DIAGNOSIS — I110 Hypertensive heart disease with heart failure: Secondary | ICD-10-CM | POA: Diagnosis not present

## 2023-06-01 DIAGNOSIS — E119 Type 2 diabetes mellitus without complications: Secondary | ICD-10-CM | POA: Insufficient documentation

## 2023-06-01 DIAGNOSIS — Z1231 Encounter for screening mammogram for malignant neoplasm of breast: Secondary | ICD-10-CM

## 2023-06-01 LAB — BASIC METABOLIC PANEL WITH GFR
Anion gap: 10 (ref 5–15)
BUN: 9 mg/dL (ref 8–23)
CO2: 26 mmol/L (ref 22–32)
Calcium: 9.1 mg/dL (ref 8.9–10.3)
Chloride: 103 mmol/L (ref 98–111)
Creatinine, Ser: 0.95 mg/dL (ref 0.44–1.00)
GFR, Estimated: >60 mL/min (ref >60)
Glucose, Bld: 132 mg/dL — ABNORMAL HIGH (ref 70–99)
Potassium: 3.9 mmol/L (ref 3.5–5.1)
Sodium: 139 mmol/L (ref 135–145)

## 2023-06-01 LAB — MAGNESIUM: Magnesium: 2.4 mg/dL (ref 1.7–2.4)

## 2023-06-01 MED ORDER — FUROSEMIDE 40 MG PO TABS: ORAL_TABLET | ORAL | 2 refills | Status: DC

## 2023-06-01 MED ORDER — XARELTO 20 MG PO TABS: 20.0000 mg | ORAL_TABLET | Freq: Every day | ORAL | 2 refills | Status: DC

## 2023-06-01 MED ORDER — DOFETILIDE 500 MCG PO CAPS: 500.0000 ug | ORAL_CAPSULE | Freq: Two times a day (BID) | ORAL | 2 refills | Status: DC

## 2023-06-01 NOTE — Progress Notes (Signed)
 Primary Care Physician: Perley Bradley, MD Primary Cardiologist: Dr Micael Adas Primary Electrophysiologist: previously Dr Nunzio Belch  Referring Physician: Dr Melbourne Spitz is a 78 y.o. female with a history of HTN, DM, chronic diastolic CHF, atrial fibrillation who presents for follow up in the [facility]. Seen previously by Tera Fellows in 2018. She has been maintained on sotalol  since 2018. Patient is on Xarelto  for a CHADS2VASC score of 5. She was seen by Dr Micael Adas on 05/13/20 and was found to be back in afib. She admits she had stayed up late for a few nights and had not used her CPAP. She has noticed increased fatigue since then. Patient is s/p DCCV 06/03/20. She had an echocardiogram on 07/18/20 and was noted to be back in afib. Patient is s/p dofetilide  admission 7/19-7/22/22. She converted with the medication and did not require DCCV.   Patient returns for follow up for atrial fibrillation and dofetilide  monitoring. She reports that she has done well from a cardiac standpoint. Her ILR continues to show low burden of afib. She has been diagnosed with achalasia and is seeing GI at [facility]. No bleeding issues on anticoagulation.   Today, she  denies symptoms of palpitations, chest pain, shortness of breath, orthopnea, PND, lower extremity edema, dizziness, presyncope, syncope, snoring, daytime somnolence, bleeding, or neurologic sequela. The patient is tolerating medications without difficulties and is otherwise without complaint today.    Atrial Fibrillation Risk Factors:  she does have symptoms or diagnosis of sleep apnea. she does not have a history of rheumatic fever.   Atrial Fibrillation Management history:  Previous antiarrhythmic drugs: sotalol , dofetilide   Previous cardioversions: 2018 x 3, 06/03/20 Previous ablations: none Anticoagulation history: Xarelto     Past Medical History:  Diagnosis Date   Abdominal pain, left lower quadrant    Asthmatic  bronchitis    Benign essential HTN 04/26/2016   Bursitis of hip    Chronic diastolic CHF (congestive heart failure) (HCC)    DCM (dilated cardiomyopathy) (HCC)    EF 50-55% in Jan 2023   GERD (gastroesophageal reflux disease)    High cholesterol    History of kidney stones    Hypersomnia    Morbid obesity with BMI of 50.0-59.9, adult (HCC)    OSA on CPAP    Osteoarthritis    "right hip; both knees" (09/21/2016)   Persistent atrial fibrillation (HCC) 04/26/2016   Type II diabetes mellitus (HCC)    Vitamin D  deficiency disease     Current Outpatient Medications  Medication Sig Dispense Refill   alendronate  (FOSAMAX ) 70 MG tablet Take 70 mg by mouth every Sunday. Take with a full glass of water on an empty stomach.     betamethasone  dipropionate 0.05 % cream Apply 1 application  topically 2 (two) times daily as needed (skin irritation.).     Biotin w/ Vitamins C & E (HAIR SKIN & NAILS GUMMIES PO) Take 2 tablets by mouth daily.     blood glucose meter kit and supplies Dispense based on patient and insurance preference. Use up to four times daily as directed. (FOR ICD-10 E10.9, E11.9). 1 each 0   Calcium -Vitamin D -Vitamin K (CALCIUM  + D + K PO) Take 1 tablet by mouth in the morning.     carvedilol  (COREG ) 3.125 MG tablet Take 1 tablet (3.125 mg total) by mouth 2 (two) times daily with a meal. 180 tablet 2   Cholecalciferol  (VITAMIN D -3) 125 MCG (5000 UT) TABS Take 5,000  Units by mouth in the morning.     dofetilide  (TIKOSYN ) 500 MCG capsule Take 1 capsule (500 mcg total) by mouth 2 (two) times daily. 180 capsule 2   fluocinolone (SYNALAR) 0.01 % external solution Apply 1 application topically 2 (two) times daily as needed (skin irritation).     FLUoxetine (PROZAC) 20 MG capsule Take 20 mg by mouth daily.     folic acid  (FOLVITE ) 800 MCG tablet Take 800 mcg by mouth in the morning.     furosemide  (LASIX ) 40 MG tablet Take 1 tablet (40 MG) daily. As needed patient may take 40 MG additional  Lasix  PRN by mouth daily x 4 days as directed per Alleviate Research HF Study PRN plan. 180 tablet 2   gabapentin  (NEURONTIN ) 300 MG capsule Take 300 mg by mouth at bedtime.     HYDROcodone  bit-homatropine (HYCODAN) 5-1.5 MG/5ML syrup Take 5 mLs by mouth daily as needed for cough.     HYDROcodone -acetaminophen  (NORCO) 5-325 MG tablet Take 1 tablet by mouth every 4 (four) hours as needed for moderate pain (kidney stone pain). 20 tablet 0   ibuprofen  (ADVIL ) 600 MG tablet Take 1 tablet (600 mg total) by mouth 3 (three) times daily. 42 tablet 0   Insulin  Glargine (BASAGLAR  KWIKPEN [place]) Inject 40 Units into the skin in the morning.     Insulin  Pen Needle 31G X 5 MM MISC 12 Units by Does not apply route 3 (three) times daily. 100 each 0   Lancets (ONETOUCH DELICA PLUS LANCET33G) MISC Apply topically 3 (three) times daily.     Misc Natural Products (GLUCOSAMINE CHONDROITIN TRIPLE) TABS Take 2 tablets by mouth daily.     Multiple Vitamins-Minerals (CENTRUM SILVER ) CHEW Chew 2 tablets by mouth daily. soft chews     ONETOUCH VERIO test strip SMARTSIG:Via Meter 3-4 Times Daily PRN     pantoprazole  (PROTONIX ) 40 MG tablet Take 40 mg by mouth 2 (two) times daily.     Polyethyl Glycol-Propyl Glycol (SYSTANE) 0.4-0.3 % SOLN Place 1-2 drops into both eyes 3 (three) times daily as needed (dry/irritated eyes).     potassium chloride  (KLOR-CON  M10) 10 MEQ tablet TAKE 2 TABLETS BY MOUTH TWICE A DAY 360 tablet 3   psyllium (METAMUCIL SMOOTH TEXTURE) 28 % packet Take 1 packet by mouth daily as needed (regularity).     Semaglutide,0.25 or 0.5MG /DOS, (OZEMPIC, 0.25 OR 0.5 MG/DOSE,) 2 MG/3ML SOPN Inject 1 mg into the skin every Saturday.     simvastatin  (ZOCOR ) 10 MG tablet Take 1 tablet (10 mg total) by mouth at bedtime. 90 tablet 1   sodium chloride  (OCEAN) 0.65 % SOLN nasal spray Place 2 sprays into both nostrils in the morning and at bedtime. 66 mL 3   spironolactone  (ALDACTONE ) 25 MG tablet TAKE 1/2 TABLET (12.5MG   TOTAL) DAILY 45 tablet 2   triamcinolone cream (KENALOG) 0.1 % Apply 1 application topically 2 (two) times daily as needed (itching legs).      XARELTO  20 MG TABS tablet Take 1 tablet (20 mg total) by mouth daily. 90 tablet 1   No current facility-administered medications for this encounter.    ROS- All systems are reviewed and negative except as per the HPI above.  Physical Exam: Vitals:   06/01/23 1355  BP: 122/64  Pulse: 67  Weight: 112.1 kg  Height: 5' 1.5" (1.562 m)     GEN: Well nourished, well developed in no acute distress CARDIAC: Regular rate and rhythm, no murmurs, rubs, gallops RESPIRATORY:  Clear to auscultation without rales, wheezing or rhonchi  ABDOMEN: Soft, non-tender, non-distended EXTREMITIES:  No edema; No deformity    Wt Readings from Last 3 Encounters:  06/01/23 112.1 kg  02/25/23 117.5 kg  02/25/23 117.8 kg    EKG today demonstrates  SR, LAFB Vent. rate 67 BPM PR interval 170 ms QRS duration 114 ms QT/QTcB 472/498 ms   Echo 07/21/22 demonstrated   1. Left ventricular ejection fraction, by estimation, is 55 to 60%. The  left ventricle has normal function. The left ventricle has no regional  wall motion abnormalities.   2. Right ventricular systolic function is normal. The right ventricular  size is normal.   3. A small pericardial effusion is present.   4. The mitral valve is normal in structure. No evidence of mitral valve  regurgitation. No evidence of mitral stenosis.   5. The aortic valve is normal in structure. Aortic valve regurgitation is  trivial. No aortic stenosis is present.   6. The inferior vena cava is normal in size with greater than 50%  respiratory variability, suggesting right atrial pressure of 3 mmHg.   Epic records are reviewed at length today  CHA2DS2-VASc Score = 5  The patient's score is based upon: CHF History: 1 HTN History: 1 Diabetes History: 0 Stroke History: 0 Vascular Disease History: 0 Age Score:  2 Gender Score: 1       ASSESSMENT AND PLAN: Persistent Atrial Fibrillation (ICD10:  I48.19) The patient's CHA2DS2-VASc score is 5, indicating a 7.2% annual risk of stroke.   S/p dofetilide  loading 08/2020 Patient appears to be maintaining SR Continue dofetilide  500 mcg BID Continue Xarelto  20 mg daily Continue carvedilol  3.125 mg BID  Secondary Hypercoagulable State (ICD10:  D68.69) The patient is at significant risk for stroke/thromboembolism based upon her CHA2DS2-VASc Score of 5.  Continue Rivaroxaban  (Xarelto ). No bleeding issues.   High Risk Medication Monitoring (ICD 10: Z79.899) QT interval on ECG acceptable for dofetilide  monitoring. Check bmet/mag today.      Obesity Body mass index is 45.95 kg/m.  Encouraged lifestyle modification Patient on Ozempic Patient is slowly losing weight, congratulated.   OSA  Encouraged nightly CPAP Followed by Dr Micael Adas  HTN Stable on current regimen  Chronic HFpEF EF 55-60% Enrolled in Alleviate HF GDMT per primary cardiology team Fluid status appears stable today    Follow up in the AF clinic in 6 months.    Myrtha Ates PA-C [facility] [address] [place], [place] [address] [phone] 06/01/2023 2:11 PM

## 2023-06-02 ENCOUNTER — Other Ambulatory Visit (HOSPITAL_BASED_OUTPATIENT_CLINIC_OR_DEPARTMENT_OTHER): Payer: Self-pay | Admitting: Family Medicine

## 2023-06-02 DIAGNOSIS — M810 Age-related osteoporosis without current pathological fracture: Secondary | ICD-10-CM

## 2023-06-13 DIAGNOSIS — Z006 Encounter for examination for normal comparison and control in clinical research program: Secondary | ICD-10-CM

## 2023-06-13 NOTE — Research (Signed)
 Alleviate HF Research Study  Study Exit  No adverse events or cardiovascular medication changes to report at this time.   Current Outpatient Medications:    alendronate  (FOSAMAX ) 70 MG tablet, Take 70 mg by mouth every Sunday. Take with a full glass of water on an empty stomach., Disp: , Rfl:    betamethasone  dipropionate 0.05 % cream, Apply 1 application  topically 2 (two) times daily as needed (skin irritation.)., Disp: , Rfl:    Biotin w/ Vitamins C & E (HAIR SKIN & NAILS GUMMIES PO), Take 2 tablets by mouth daily., Disp: , Rfl:    blood glucose meter kit and supplies, Dispense based on patient and insurance preference. Use up to four times daily as directed. (FOR ICD-10 E10.9, E11.9)., Disp: 1 each, Rfl: 0   Calcium -Vitamin D -Vitamin K (CALCIUM  + D + K PO), Take 1 tablet by mouth in the morning., Disp: , Rfl:    carvedilol  (COREG ) 3.125 MG tablet, Take 1 tablet (3.125 mg total) by mouth 2 (two) times daily with a meal., Disp: 180 tablet, Rfl: 2   Cholecalciferol  (VITAMIN D -3) 125 MCG (5000 UT) TABS, Take 5,000 Units by mouth in the morning., Disp: , Rfl:    dofetilide  (TIKOSYN ) 500 MCG capsule, Take 1 capsule (500 mcg total) by mouth 2 (two) times daily., Disp: 180 capsule, Rfl: 2   fluocinolone (SYNALAR) 0.01 % external solution, Apply 1 application topically 2 (two) times daily as needed (skin irritation)., Disp: , Rfl:    FLUoxetine (PROZAC) 20 MG capsule, Take 20 mg by mouth daily., Disp: , Rfl:    folic acid  (FOLVITE ) 800 MCG tablet, Take 800 mcg by mouth in the morning., Disp: , Rfl:    furosemide  (LASIX ) 40 MG tablet, Take 1 tablet (40 MG) daily. As needed patient may take 40 MG additional Lasix  PRN by mouth daily x 4 days as directed per [employer]lev[employer] Research HF Study PRN plan., Disp: 180 tablet, Rfl: 2   gabapentin  (NEURONTIN ) 300 MG capsule, Take 300 mg by mouth at bedtime., Disp: , Rfl:    HYDROcodone  bit-homatropine (HYCODAN) 5-1.5 MG/5ML syrup, Take 5 mLs by mouth daily as needed  for cough., Disp: , Rfl:    HYDROcodone -acetaminophen  (NORCO) 5-325 MG tablet, Take 1 tablet by mouth every 4 (four) hours as needed for moderate pain (kidney stone pain)., Disp: 20 tablet, Rfl: 0   ibuprofen  (ADVIL ) 600 MG tablet, Take 1 tablet (600 mg total) by mouth 3 (three) times daily., Disp: 42 tablet, Rfl: 0   Insulin  Glargine (BASAGLAR  KWIKPEN [place]), Inject 40 Units into the skin in the morning., Disp: , Rfl:    Insulin  Pen Needle 31G X 5 MM MISC, 12 Units by Does not apply route 3 (three) times daily., Disp: 100 each, Rfl: 0   Lancets (ONETOUCH DELICA PLUS LANCET33G) MISC, Apply topically 3 (three) times daily., Disp: , Rfl:    Misc Natural Products (GLUCOSAMINE CHONDROITIN TRIPLE) TABS, Take 2 tablets by mouth daily., Disp: , Rfl:    Multiple Vitamins-Minerals (CENTRUM SILVER ) CHEW, Chew 2 tablets by mouth daily. soft chews, Disp: , Rfl:    ONETOUCH VERIO test strip, SMARTSIG:Via Meter 3-4 Times Daily PRN, Disp: , Rfl:    pantoprazole  (PROTONIX ) 40 MG tablet, Take 40 mg by mouth 2 (two) times daily., Disp: , Rfl:    Polyethyl Glycol-Propyl Glycol (SYSTANE) 0.4-0.3 % SOLN, Place 1-2 drops into both eyes 3 (three) times daily as needed (dry/irritated eyes)., Disp: , Rfl:    potassium chloride  (KLOR-CON  M10)  10 MEQ tablet, TAKE 2 TABLETS BY MOUTH TWICE A DAY, Disp: 360 tablet, Rfl: 3   psyllium (METAMUCIL SMOOTH TEXTURE) 28 % packet, Take 1 packet by mouth daily as needed (regularity)., Disp: , Rfl:    Semaglutide,0.25 or 0.5MG /DOS, (OZEMPIC, 0.25 OR 0.5 MG/DOSE,) 2 MG/3ML SOPN, Inject 1 mg into the skin every Saturday., Disp: , Rfl:    simvastatin  (ZOCOR ) 10 MG tablet, Take 1 tablet (10 mg total) by mouth at bedtime., Disp: 90 tablet, Rfl: 1   sodium chloride  (OCEAN) 0.65 % SOLN nasal spray, Place 2 sprays into both nostrils in the morning and at bedtime., Disp: 66 mL, Rfl: 3   spironolactone  (ALDACTONE ) 25 MG tablet, TAKE 1/2 TABLET (12.5MG  TOTAL) DAILY, Disp: 45 tablet, Rfl: 2    triamcinolone cream (KENALOG) 0.1 %, Apply 1 application topically 2 (two) times daily as needed (itching legs). , Disp: , Rfl:    XARELTO  20 MG TABS tablet, Take 1 tablet (20 mg total) by mouth daily., Disp: 90 tablet, Rfl: 2

## 2023-06-16 ENCOUNTER — Ambulatory Visit: Attending: Internal Medicine | Admitting: Student

## 2023-06-16 DIAGNOSIS — Z01810 Encounter for preprocedural cardiovascular examination: Secondary | ICD-10-CM

## 2023-06-16 NOTE — Progress Notes (Signed)
 Virtual Visit via Telephone Note   Because of Karen Rasmussen's co-morbid illnesses, she is at least at moderate risk for complications without adequate follow up.  This format is felt to be most appropriate for this patient at this time.  The patient did not have access to video technology/had technical difficulties with video requiring transitioning to audio format only (telephone).  All issues noted in this document were discussed and addressed.  No physical exam could be performed with this format.  Please refer to the patient's chart for her consent to telehealth for [name].  Evaluation Performed:  Preoperative cardiovascular risk assessment _____________   Date:  06/16/2023   Patient ID:  Karen Rasmussen, DOB 07-31-1945, MRN [id number] Patient Location:  Home Provider location:   Office  Primary Care Provider:  Perley Bradley, MD Primary Cardiologist:  Gaylyn Keas, MD  Chief Complaint / Patient Profile   78 y.o. y/o female with a h/o PAF s/p DCCV x 2 on Xarelto , chronic diastolic heart failure, hypertension, OSA on CPAP, GERD who is pending Colonoscopy and EGD by [facility] and presents today for telephonic preoperative cardiovascular risk assessment.  History of Present Illness    Karen Rasmussen is a 78 y.o. female who presents via audio/video conferencing for a telehealth visit today.  Pt was last seen in cardiology clinic on 02/25/2023 by Dr. Micael Adas.  At that time Alberteen Aloe was stable from a cardiac standpoint.  The patient is now pending procedure as outlined above. Since her last visit, she is doing well. Patient denies shortness of breath, dyspnea on exertion, lower extremity edema, orthopnea or PND. No chest pain, pressure, or tightness. No palpitations. She is active around her home. She is independent with ADLs and performs light to moderate household activities.   Past Medical History    Past Medical History:  Diagnosis Date   Abdominal pain, left lower quadrant     Asthmatic bronchitis    Benign essential HTN 04/26/2016   Bursitis of hip    Chronic diastolic CHF (congestive heart failure) (HCC)    DCM (dilated cardiomyopathy) (HCC)    EF 50-55% in Jan 2023   GERD (gastroesophageal reflux disease)    High cholesterol    History of kidney stones    Hypersomnia    Morbid obesity with BMI of 50.0-59.9, adult (HCC)    OSA on CPAP    Osteoarthritis    "right hip; both knees" (09/21/2016)   Persistent atrial fibrillation (HCC) 04/26/2016   Type II diabetes mellitus (HCC)    Vitamin D  deficiency disease    Past Surgical History:  Procedure Laterality Date   ABDOMINAL HYSTERECTOMY     APPENDECTOMY     BALLOON DILATION N/A 11/17/2017   Procedure: BALLOON DILATION;  Surgeon: Genell Ken, MD;  Location: [facility];  Service: Gastroenterology;  Laterality: N/A;   BIOPSY  11/17/2017   Procedure: BIOPSY;  Surgeon: Genell Ken, MD;  Location: [facility];  Service: Gastroenterology;;   BIOPSY  07/13/2022   Procedure: BIOPSY;  Surgeon: Genell Ken, MD;  Location: [facility];  Service: Gastroenterology;;   BOTOX  INJECTION  07/13/2022   Procedure: BOTOX  INJECTION;  Surgeon: Genell Ken, MD;  Location: [facility];  Service: Gastroenterology;;   CARDIAC CATHETERIZATION     CARDIOVERSION N/A 06/11/2016   Procedure: CARDIOVERSION;  Surgeon: Liza Riggers, MD;  Location: [facility] ENDOSCOPY;  Service: Cardiovascular;  Laterality: N/A;   CARDIOVERSION N/A 07/16/2016   Procedure: CARDIOVERSION;  Surgeon: Alroy Aspen,  Lela Purple, MD;  Location: [facility] ENDOSCOPY;  Service: Cardiovascular;  Laterality: N/A;   CARDIOVERSION N/A 09/23/2016   Procedure: CARDIOVERSION;  Surgeon: Luana Rumple, MD;  Location: [facility] ENDOSCOPY;  Service: Cardiovascular;  Laterality: N/A;   CARDIOVERSION N/A 06/03/2020   Procedure: CARDIOVERSION;  Surgeon: Wendie Hamburg, MD;  Location: [facility] ENDOSCOPY;  Service: Cardiovascular;  Laterality: N/A;   CHOLECYSTECTOMY OPEN     COLONOSCOPY N/A  11/17/2017   Procedure: COLONOSCOPY;  Surgeon: Genell Ken, MD;  Location: [facility];  Service: Gastroenterology;  Laterality: N/A;   cortisone injection to right knee     costisone injection to left knee  11/03/2017   CYSTOSCOPY WITH RETROGRADE PYELOGRAM, URETEROSCOPY AND STENT PLACEMENT Left 01/14/2019   Procedure: CYSTOSCOPy  STENT PLACEMENT;  Surgeon: Roxane Copp, MD;  [facility]: [facility];  Service: Urology;  Laterality: Left;   CYSTOSCOPY WITH RETROGRADE PYELOGRAM, URETEROSCOPY AND STENT PLACEMENT Left 03/06/2019   Procedure: CYSTOSCOPY WITH RETROGRADE PYELOGRAM, URETEROSCOPY AND STENT PLACEMENT;  Surgeon: Roxane Copp, MD;  [facility];  Service: Urology;  Laterality: Left;  90 MINS   CYSTOSCOPY WITH STENT PLACEMENT Left 03/07/2019   Procedure: CYSTOSCOPY WITH , URETEROSCOPY/ STENT PLACEMENT;  Surgeon: Andrez Banker, MD;  Location: [facility];  Service: Urology;  Laterality: Left;   DILATION AND CURETTAGE OF UTERUS     S/P miscarriage   ESOPHAGEAL MANOMETRY N/A 08/25/2022   Procedure: ESOPHAGEAL MANOMETRY (EM);  Surgeon: Baldo Bonds, MD;  [facility];  Service: Gastroenterology;  Laterality: N/A;   ESOPHAGOGASTRODUODENOSCOPY N/A 11/17/2017   Procedure: ESOPHAGOGASTRODUODENOSCOPY (EGD);  Surgeon: Genell Ken, MD;  Location: [facility];  Service: Gastroenterology;  Laterality: N/A;   ESOPHAGOGASTRODUODENOSCOPY (EGD) WITH PROPOFOL  N/A 07/13/2022   Procedure: ESOPHAGOGASTRODUODENOSCOPY (EGD) WITH PROPOFOL ;  Surgeon: Genell Ken, MD;  [facility];  Service: Gastroenterology;  Laterality: N/A;   HOLMIUM LASER APPLICATION Left 03/06/2019   Procedure: HOLMIUM LASER APPLICATION;  Surgeon: Roxane Copp, MD;  Location: [facility];  Service: Urology;  Laterality: Left;   implantable loop recorder placement  10/20/2020   Medtronic Reveal Linq model LNQ 11 (SN [id number]) implantable loop recorder implanted for Alleviate HF trial   NASAL SEPTUM SURGERY      POLYPECTOMY  11/17/2017   Procedure: POLYPECTOMY;  Surgeon: Genell Ken, MD;  [facility]: [facility];  Service: Gastroenterology;;   POLYPECTOMY  07/13/2022   Procedure: POLYPECTOMY;  Surgeon: Genell Ken, MD;  [facility];  Service: Gastroenterology;;   TONSILLECTOMY      Allergies  Allergies  Allergen Reactions   Diphenhydramine Other (See Comments)    heart issues   No [employer] Allergies     Defibrillator pads cause rash.    Home Medications    Prior to Admission medications   Medication Sig Start Date End Date Taking? Authorizing Provider  alendronate  (FOSAMAX ) 70 MG tablet Take 70 mg by mouth every Sunday. Take with a full glass of water on an empty stomach.    [provider]  betamethasone  dipropionate 0.05 % cream Apply 1 application  topically 2 (two) times daily as needed (skin irritation.).    [provider]  Biotin w/ Vitamins C & E (HAIR SKIN & NAILS GUMMIES PO) Take 2 tablets by mouth daily.    [provider]  blood glucose meter kit and supplies Dispense based on patient and insurance preference. Use up to four times daily as directed. (FOR ICD-10 E10.9, E11.9). 01/20/19   Armenta Landau, MD  Calcium -Vitamin D -Vitamin K (  CALCIUM  + D + K PO) Take 1 tablet by mouth in the morning.    [provider]  carvedilol  (COREG ) 3.125 MG tablet Take 1 tablet (3.125 mg total) by mouth 2 (two) times daily with a meal. 12/13/22   Turner, Rufus Council, MD  Cholecalciferol  (VITAMIN D -3) 125 MCG (5000 UT) TABS Take 5,000 Units by mouth in the morning.    [provider]  dofetilide  (TIKOSYN ) 500 MCG capsule Take 1 capsule (500 mcg total) by mouth 2 (two) times daily. 06/01/23   Fenton, Clint R, PA  fluocinolone (SYNALAR) 0.01 % external solution Apply 1 application topically 2 (two) times daily as needed (skin irritation).    [provider]  FLUoxetine (PROZAC) 20 MG capsule Take 20 mg by mouth daily.    [provider]  folic acid  (FOLVITE ) 800 MCG tablet Take 800 mcg by mouth in the morning.    [provider]  furosemide  (LASIX ) 40 MG tablet Take 1 tablet (40 MG) daily. As needed patient may take 40 MG additional Lasix  PRN by mouth daily x 4 days as directed per Alleviate [employer] HF Study PRN plan. 06/01/23 05/31/24  Fenton, Clint R, PA  gabapentin  (NEURONTIN ) 300 MG capsule Take 300 mg by mouth at bedtime.    [provider]  HYDROcodone  bit-homatropine (HYCODAN) 5-1.5 MG/5ML syrup Take 5 mLs by mouth daily as needed for cough. 09/02/20   [provider]  HYDROcodone -acetaminophen  (NORCO) 5-325 MG tablet Take 1 tablet by mouth every 4 (four) hours as needed for moderate pain (kidney stone pain). 03/08/19   Pace, Maryellen D, MD  ibuprofen  (ADVIL ) 600 MG tablet Take 1 tablet (600 mg total) by mouth 3 (three) times daily. 07/23/22   Jacqueline Matsu, MD  Insulin  Glargine (BASAGLAR  KWIKPEN [place]) Inject 40 Units into the skin in the morning.    [provider]  Insulin  Pen Needle 31G X 5 MM MISC 12 Units by Does not apply route 3 (three) times daily. 01/20/19   Armenta Landau, MD  Lancets [facility] DELICA PLUS [place]) MISC Apply topically 3 (three) times daily. 04/09/22   [provider]  Misc Natural Products (GLUCOSAMINE CHONDROITIN TRIPLE) TABS Take 2 tablets by mouth daily.    [provider]  Multiple Vitamins-Minerals (CENTRUM SILVER ) CHEW Chew 2 tablets by mouth daily. soft chews    [provider]  ONETOUCH VERIO test strip SMARTSIG:Via Meter 3-4 Times Daily PRN 01/26/22   [provider]  pantoprazole  (PROTONIX ) 40 MG tablet Take 40 mg by mouth 2 (two) times daily.    [provider]  Polyethyl Glycol-Propyl Glycol (SYSTANE) 0.4-0.3 % SOLN Place 1-2 drops into both eyes 3 (three) times daily as needed (dry/irritated eyes).    [provider]  potassium chloride  (KLOR-CON  M10) 10 MEQ tablet TAKE 2 TABLETS BY  MOUTH TWICE A DAY 03/24/23   Turner, Rufus Council, MD  psyllium (METAMUCIL SMOOTH TEXTURE) 28 % packet Take 1 packet by mouth daily as needed (regularity).    [provider]  Semaglutide,0.25 or 0.5MG /DOS, (OZEMPIC, 0.25 OR 0.5 MG/DOSE,) 2 MG/3ML SOPN Inject 1 mg into the skin every Saturday. 05/04/21   [provider]  simvastatin  (ZOCOR ) 10 MG tablet Take 1 tablet (10 mg total) by mouth at bedtime. 03/30/19   Jacqueline Matsu, MD  sodium chloride  (OCEAN) 0.65 % SOLN nasal spray Place 2 sprays into both nostrils in the morning and at bedtime. 11/04/22   Jacqueline Matsu, MD  spironolactone  (ALDACTONE ) 25 MG tablet TAKE 1/2 TABLET (12.5MG  TOTAL) DAILY 12/14/22   Turner, Traci R, MD  triamcinolone cream (KENALOG) 0.1 % Apply 1 application topically 2 (two) times daily as needed (itching legs).  12/15/18   [provider]  XARELTO  20 MG TABS tablet Take 1 tablet (20 mg total) by mouth daily. 06/01/23   Twana Gal, PA    Physical Exam    Vital Signs:  SHALONDRA HISEY does not have vital signs available for review today.  Given telephonic nature of communication, physical exam is limited. AAOx3. NAD. Normal affect.  Speech and respirations are unlabored.  Assessment & Plan    Primary Cardiologist: Gaylyn Keas, MD  Preoperative cardiovascular risk assessment.  Colonoscopy and EGD by [facility].  Chart reviewed as part of pre-operative protocol coverage. According to the RCRI, patient has a 0.4% risk of MACE. Patient reports activity equivalent to 4.06 METS (per DASI).   Given past medical history and time since last visit, based on ACC/AHA guidelines, ELMYRA FERRELLI would be at acceptable risk for the planned procedure without further cardiovascular testing.   Patient was advised that if she develops new symptoms prior to surgery to contact our office to arrange a follow-up appointment.  she verbalized understanding.  Per Pharm D, patient may hold Xarelto  for 2 days prior to  procedure.    I will route this recommendation to the requesting party via Epic fax function.  Please call with questions.  Time:   Today, I have spent 10 minutes with the patient with telehealth technology discussing medical history, symptoms, and management plan.     Morey Ar, NP  06/16/2023, 8:07 AM

## 2023-07-01 DIAGNOSIS — Z7985 Long-term (current) use of injectable non-insulin antidiabetic drugs: Secondary | ICD-10-CM | POA: Diagnosis not present

## 2023-07-01 DIAGNOSIS — K224 Dyskinesia of esophagus: Secondary | ICD-10-CM | POA: Diagnosis not present

## 2023-07-01 DIAGNOSIS — R1310 Dysphagia, unspecified: Secondary | ICD-10-CM | POA: Diagnosis not present

## 2023-07-01 DIAGNOSIS — D125 Benign neoplasm of sigmoid colon: Secondary | ICD-10-CM | POA: Diagnosis not present

## 2023-07-01 DIAGNOSIS — E119 Type 2 diabetes mellitus without complications: Secondary | ICD-10-CM | POA: Diagnosis not present

## 2023-07-01 DIAGNOSIS — I482 Chronic atrial fibrillation, unspecified: Secondary | ICD-10-CM | POA: Diagnosis not present

## 2023-07-01 DIAGNOSIS — I10 Essential (primary) hypertension: Secondary | ICD-10-CM | POA: Diagnosis not present

## 2023-07-01 DIAGNOSIS — Z79899 Other long term (current) drug therapy: Secondary | ICD-10-CM | POA: Diagnosis not present

## 2023-07-01 DIAGNOSIS — Z888 Allergy status to other drugs, medicaments and biological substances status: Secondary | ICD-10-CM | POA: Diagnosis not present

## 2023-07-01 DIAGNOSIS — K635 Polyp of colon: Secondary | ICD-10-CM | POA: Diagnosis not present

## 2023-07-01 DIAGNOSIS — K220 Achalasia of cardia: Secondary | ICD-10-CM | POA: Diagnosis not present

## 2023-07-01 DIAGNOSIS — Z1211 Encounter for screening for malignant neoplasm of colon: Secondary | ICD-10-CM | POA: Diagnosis not present

## 2023-07-06 NOTE — Research (Signed)
 Medications reviewed and action taken:   -[]  Medication changes made                       -[x]  No medication change     **If no medication changes , specify rationale**                       -[]  PRN medication intervention changes

## 2023-07-08 ENCOUNTER — Telehealth

## 2023-07-12 ENCOUNTER — Ambulatory Visit (HOSPITAL_COMMUNITY)
Admission: RE | Admit: 2023-07-12 | Discharge: 2023-07-12 | Disposition: A | Discharge status: Home / Self Care | Payer: Medicare Other | Source: Ambulatory Visit | Attending: Cardiology | Admitting: Cardiology

## 2023-07-12 ENCOUNTER — Ambulatory Visit: Payer: Self-pay | Admitting: Cardiology

## 2023-07-12 DIAGNOSIS — I3139 Other pericardial effusion (noninflammatory): Secondary | ICD-10-CM

## 2023-07-12 LAB — ECHOCARDIOGRAM COMPLETE
Area-P 1/2: 3.71 cm2
Est EF: 50
S' Lateral: 3.8 cm

## 2023-07-13 NOTE — Telephone Encounter (Signed)
 Pt received echo results in her mychart but does not fully understand. Requesting cb

## 2023-07-22 DIAGNOSIS — K220 Achalasia of cardia: Secondary | ICD-10-CM | POA: Diagnosis not present

## 2023-07-23 ENCOUNTER — Telehealth: Payer: Self-pay | Admitting: Physician Assistant

## 2023-07-23 NOTE — Telephone Encounter (Signed)
 Pt has hx of HFpEF, AFib managed with Tikosyn . She was started on Hyoscyamine for achalasia and wanted to make sure it was ok for her to take. I do not see any interactions with Hyoscyamine and Tikosyn , Coreg , Xarelto . She would be able to take this as needed without any issues. Marlyse Single, PA-C    07/23/2023 1:14 PM

## 2023-07-25 NOTE — Telephone Encounter (Signed)
-----   Message from Gaylyn Keas sent at 07/12/2023  7:11 PM EDT ----- Echo showed low normal pumping function of the heart muscle with increased stiffness of heart muscle called diastolic dysfunction, mildly enlarged LA, mildly calcified AV with no AS and small pericardial effusion which is fluid in the sac that the heart sits in. No significant change from prior echo 04/03/2021

## 2023-07-25 NOTE — Telephone Encounter (Signed)
 Patient states she was in Alleviate heart study and was given a choice of whether to keep her device or not. She states she chose to keep her device and understood that she would have to transfer monitoring to another department but does not know who/what department to transfer her care to. Forwarded to contact person patient states she previously interacted with.

## 2023-07-25 NOTE — Telephone Encounter (Signed)
 Patient verbalizes understanding that echo showed low normal pumping function of the heart muscle with increased stiffness of heart muscle called diastolic dysfunction, mildly enlarged LA, mildly calcified AV with no AS and small pericardial effusion which is fluid in the sac that the heart sits in. No significant change from prior echo 04/03/2021 per Dr. Micael Adas.

## 2023-07-29 ENCOUNTER — Telehealth (HOSPITAL_COMMUNITY): Payer: Self-pay

## 2023-07-29 NOTE — Telephone Encounter (Signed)
 Patient states she had chest pain earlier today. The pain is described as a sharp pain stabbing her in the chest and radiating into the side of her back. She has a history of angina. This episode started earlier today and it lasted about 12 minutes. Patient states she did take 1 tablet of her Hyoscyamine and this seemed to settle her chest pain. Patient advised to reach out to her pcp if her symptoms progress. She is scheduled to see Dr. Micael Adas next week to evaluate her chest pain. Patient advised to go to the ER if she starts having trouble breathing along with shortness of breath. Consulted with patient and she verbalized understanding.

## 2023-08-01 DIAGNOSIS — D1801 Hemangioma of skin and subcutaneous tissue: Secondary | ICD-10-CM | POA: Diagnosis not present

## 2023-08-01 DIAGNOSIS — D225 Melanocytic nevi of trunk: Secondary | ICD-10-CM | POA: Diagnosis not present

## 2023-08-01 DIAGNOSIS — L700 Acne vulgaris: Secondary | ICD-10-CM | POA: Diagnosis not present

## 2023-08-01 DIAGNOSIS — I8312 Varicose veins of left lower extremity with inflammation: Secondary | ICD-10-CM | POA: Diagnosis not present

## 2023-08-01 DIAGNOSIS — I872 Venous insufficiency (chronic) (peripheral): Secondary | ICD-10-CM | POA: Diagnosis not present

## 2023-08-01 DIAGNOSIS — D2272 Melanocytic nevi of left lower limb, including hip: Secondary | ICD-10-CM | POA: Diagnosis not present

## 2023-08-01 DIAGNOSIS — L821 Other seborrheic keratosis: Secondary | ICD-10-CM | POA: Diagnosis not present

## 2023-08-01 DIAGNOSIS — I8311 Varicose veins of right lower extremity with inflammation: Secondary | ICD-10-CM | POA: Diagnosis not present

## 2023-08-03 ENCOUNTER — Ambulatory Visit: Attending: Cardiology | Admitting: Cardiology

## 2023-08-03 ENCOUNTER — Encounter: Payer: Self-pay | Admitting: Cardiology

## 2023-08-03 VITALS — BP 120/70 | HR 63 | Ht 62.0 in | Wt 248.0 lb

## 2023-08-03 DIAGNOSIS — R072 Precordial pain: Secondary | ICD-10-CM | POA: Insufficient documentation

## 2023-08-03 DIAGNOSIS — G4733 Obstructive sleep apnea (adult) (pediatric): Secondary | ICD-10-CM | POA: Diagnosis not present

## 2023-08-03 DIAGNOSIS — I10 Essential (primary) hypertension: Secondary | ICD-10-CM | POA: Diagnosis not present

## 2023-08-03 DIAGNOSIS — I5032 Chronic diastolic (congestive) heart failure: Secondary | ICD-10-CM | POA: Insufficient documentation

## 2023-08-03 DIAGNOSIS — I3139 Other pericardial effusion (noninflammatory): Secondary | ICD-10-CM | POA: Diagnosis not present

## 2023-08-03 DIAGNOSIS — I4819 Other persistent atrial fibrillation: Secondary | ICD-10-CM | POA: Diagnosis not present

## 2023-08-03 DIAGNOSIS — Z01812 Encounter for preprocedural laboratory examination: Secondary | ICD-10-CM | POA: Insufficient documentation

## 2023-08-03 DIAGNOSIS — R079 Chest pain, unspecified: Secondary | ICD-10-CM | POA: Diagnosis not present

## 2023-08-03 DIAGNOSIS — R0789 Other chest pain: Secondary | ICD-10-CM | POA: Insufficient documentation

## 2023-08-03 DIAGNOSIS — Z79899 Other long term (current) drug therapy: Secondary | ICD-10-CM | POA: Insufficient documentation

## 2023-08-03 MED ORDER — METOPROLOL TARTRATE 50 MG PO TABS: 50.0000 mg | ORAL_TABLET | Freq: Once | ORAL | 0 refills | Status: DC

## 2023-08-03 NOTE — Addendum Note (Signed)
 Addended by: JANIT GENI CROME on: 08/03/2023 02:14 PM   Modules accepted: Orders

## 2023-08-03 NOTE — Patient Instructions (Addendum)
 Medication Instructions:  Your physician recommends that you continue on your current medications as directed. Please refer to the Current Medication list given to you today.  *If you need a refill on your cardiac medications before your next appointment, please call your pharmacy*   Lab Work: Please complete a BMET in our first floor lab before you leave today.   If you have labs (blood work) drawn today and your tests are completely normal, you will receive your results only by: MyChart Message (if you have MyChart) OR A paper copy in the mail If you have any lab test that is abnormal or we need to change your treatment, we will call you to review the results.  Testing/Procedures:   Your cardiac CT will be scheduled at one of the below locations:   [facility] [address] [place], [place] [address] [phone]  OR   [facility] [address]  [place], [place] [address]  If scheduled at [facility], please arrive at the [facility] and Children's Entrance (Entrance C2) of [facility] 30 minutes prior to test start time. You can use the FREE valet parking offered at entrance C (encouraged to control the heart rate for the test)  Proceed to the [facility] Radiology Department (first floor) to check-in and test prep.   All radiology patients and guests should use entrance C2 at [facility], accessed from [facility], even though the hospital's physical address listed is [address].    If scheduled at the Heart and Vascular Tower at [address], please enter the parking lot using the [address] entrance and use the FREE valet service at the patient drop-off area. Enter the buidling and check-in with registration on the main floor.   Please follow these instructions carefully (unless otherwise directed):  An IV will be required for this test and Nitroglycerin  will be given.   Hold all erectile dysfunction medications at least 3 days (72 hrs) prior to test. (Ie viagra, cialis, sildenafil, tadalafil, etc)   On the Night Before the Test: Be sure to Drink plenty of water. Do not consume any caffeinated/decaffeinated beverages or chocolate 12 hours prior to your test. Do not take any antihistamines 12 hours prior to your test.   On the Day of the Test: Drink plenty of water until 1 hour prior to the test. Do not eat any food 1 hour prior to test. You may take your regular medications prior to the test.  Take metoprolol  (Lopressor ) 50 MG BY MOUTH  two hours prior to test.  PLEASE HOLD YOUR SCHEDULED CARVEDILOL  (COREG ) THE MORNING OF THIS TEST AND TAKE THE METOPROLOL  50 MG BY MOUTH 2 HOURS PRIOR TO CT INSTEAD--YOU MAY TAKE NEXT SCHEDULED DOSE OF YOUR COREG  THEREAFTER  If you take Furosemide / Spironolactone  please HOLD on the morning of the test. Patients who wear a continuous glucose monitor MUST remove the device prior to scanning. FEMALES- please wear underwire-free bra if available, avoid dresses & tight clothing       After the Test: Drink plenty of water. After receiving IV contrast, you may experience a mild flushed feeling. This is normal. On occasion, you may experience a mild rash up to 24 hours after the test. This is not dangerous. If this occurs, you can take Benadryl 25 mg, Zyrtec, Claritin, or Allegra and increase your fluid intake. (Patients taking Tikosyn  should avoid Benadryl, and may take Zyrtec, Claritin, or  Allegra) If you experience trouble breathing, this can be serious. If it is severe call 911 IMMEDIATELY. If it is mild, please call our office.  We will call to schedule your test 2-4 weeks out understanding that some insurance companies will need an authorization prior to the service being performed.   For more information and frequently asked questions, please visit our website : [email or web address]  For non-scheduling  related questions, please contact the cardiac imaging nurse navigator should you have any questions/concerns: Cardiac Imaging Nurse Navigators Direct Office Dial: [phone]   For scheduling needs, including cancellations and rescheduling, please call [place], [phone].   Follow-Up: At [facility], you and your health needs are our priority.  As part of our continuing mission to provide you with exceptional heart care, our providers are all part of one team.  This team includes your primary Cardiologist (physician) and Advanced Practice Providers or APPs (Physician Assistants and Nurse Practitioners) who all work together to provide you with the care you need, when you need it.  Your next appointment:   1 year(s)  Provider:   Wilbert Bihari, MD

## 2023-08-03 NOTE — Addendum Note (Signed)
 Addended by: GLADIS PORTER HERO on: 08/03/2023 02:47 PM   Modules accepted: Orders

## 2023-08-03 NOTE — Progress Notes (Signed)
 Date:  08/03/2023   ID:  Karen LEVITZ, DOB 14-Jan-1946, MRN [id number] The patient was identified using 2 identifiers. PCP:  Cleotilde Planas, MD   [facility] Providers Cardiologist:  Wilbert Bihari, MD     Evaluation Performed:  Follow-Up Visit  Chief Complaint:  OSA, HTN, pericardial effusion, atypical CP  History of Present Illness:    Karen Rasmussen is a 78 y.o. female with history of normal coronary arteries with calcium  score of 0 on coronary CTA in 09/2016, chronic diastolic CHF/non-ischemic cardiomyopathy with EF of 50-55%, paroxysmal atrial fibrillation s/p DCCV x2 on Xarelto , obstructive sleep apnea on CPAP, hypertension, diabetes mellitus, and morbid obesity. She had a reoccurrence of her afib and was seen in afib clinic and her Betapace  was switched to Tikosyn . She uses a Naval architect and has not had any further PAF.   She is here today for followup. She recently had an EGD done for CP and had botox  injections but unfortunately she is still having CP.  The CP only occurs at night when she is in bed.  She has had 3 episodes since she had her EGD.  She was given hyoxyamine SL that helps.  She does have DOE if she over exerts herself. She denies any PND, orthopnea, LE edema, dizziness, palpitations or syncope. She is compliant with her meds and is tolerating meds with no SE.    Past Medical History:  Diagnosis Date   Abdominal pain, left lower quadrant    Asthmatic bronchitis    Benign essential HTN 04/26/2016   Bursitis of hip    Chronic diastolic CHF (congestive heart failure) (HCC)    DCM (dilated cardiomyopathy) (HCC)    EF 50-55% in Jan 2023   GERD (gastroesophageal reflux disease)    High cholesterol    History of kidney stones    Hypersomnia    Morbid obesity with BMI of 50.0-59.9, adult (HCC)    OSA on CPAP    Osteoarthritis    right hip; both knees (09/21/2016)   Persistent atrial fibrillation (HCC) 04/26/2016   Type II diabetes mellitus (HCC)     Vitamin D  deficiency disease    Past Surgical History:  Procedure Laterality Date   ABDOMINAL HYSTERECTOMY     APPENDECTOMY     BALLOON DILATION N/A 11/17/2017   Procedure: BALLOON DILATION;  Surgeon: Saintclair Jasper, MD;  Location: [facility];  Service: Gastroenterology;  Laterality: N/A;   BIOPSY  11/17/2017   Procedure: BIOPSY;  Surgeon: Saintclair Jasper, MD;  Location: [facility];  Service: Gastroenterology;;   BIOPSY  07/13/2022   Procedure: BIOPSY;  Surgeon: Saintclair Jasper, MD;  Location: [facility];  Service: Gastroenterology;;   BOTOX  INJECTION  07/13/2022   Procedure: BOTOX  INJECTION;  Surgeon: Saintclair Jasper, MD;  Location: [facility];  Service: Gastroenterology;;   CARDIAC CATHETERIZATION     CARDIOVERSION N/A 06/11/2016   Procedure: CARDIOVERSION;  Surgeon: Maranda Leim VEAR, MD;  Location: [facility] ENDOSCOPY;  Service: Cardiovascular;  Laterality: N/A;   CARDIOVERSION N/A 07/16/2016   Procedure: CARDIOVERSION;  Surgeon: Alveta Aleene PARAS, MD;  Location[facility] ENDOSCOPY;  Service: Cardiovascular;  Laterality: N/A;   CARDIOVERSION N/A 09/23/2016   Procedure: CARDIOVERSION;  Surgeon: Francyne Headland, MD;  Location: [facility] ENDOSCOPY;  Service: Cardiovascular;  Laterality: N/A;   CARDIOVERSION N/A 06/03/2020   Procedure: CARDIOVERSION;  Surgeon: Kate Lonni CROME, MD;  Location: [facility] ENDOSCOPY;  Service: Cardiovascular;  Laterality: N/A;   CHOLECYSTECTOMY OPEN     COLONOSCOPY N/A 11/17/2017  Procedure: COLONOSCOPY;  Surgeon: Saintclair Jasper, MD;  Location: [facility];  Service: Gastroenterology;  Laterality: N/A;   cortisone injection to right knee     costisone injection to left knee  11/03/2017   CYSTOSCOPY WITH RETROGRADE PYELOGRAM, URETEROSCOPY AND STENT PLACEMENT Left 01/14/2019   Procedure: CYSTOSCOPy  STENT PLACEMENT;  Surgeon: Elisabeth Valli BIRCH, MD;  Location: [facility];  Service: Urology;  Laterality: Left;   CYSTOSCOPY WITH RETROGRADE PYELOGRAM, URETEROSCOPY AND STENT PLACEMENT Left  03/06/2019   Procedure: CYSTOSCOPY WITH RETROGRADE PYELOGRAM, URETEROSCOPY AND STENT PLACEMENT;  Surgeon: Elisabeth Valli BIRCH, MD;  Location: [facility];  Service: Urology;  Laterality: Left;  90 MINS   CYSTOSCOPY WITH STENT PLACEMENT Left 03/07/2019   Procedure: CYSTOSCOPY WITH , URETEROSCOPY/ STENT PLACEMENT;  Surgeon: Cam Morene ORN, MD;  Location: [facility];  Service: Urology;  Laterality: Left;   DILATION AND CURETTAGE OF UTERUS     S/P miscarriage   ESOPHAGEAL MANOMETRY N/A 08/25/2022   Procedure: ESOPHAGEAL MANOMETRY (EM);  Surgeon: Dianna Specking, MD;  Location: [facility];  Service: Gastroenterology;  Laterality: N/A;   ESOPHAGOGASTRODUODENOSCOPY N/A 11/17/2017   Procedure: ESOPHAGOGASTRODUODENOSCOPY (EGD);  Surgeon: Saintclair Jasper, MD;  Location: [facility];  Service: Gastroenterology;  Laterality: N/A;   ESOPHAGOGASTRODUODENOSCOPY (EGD) WITH PROPOFOL  N/A 07/13/2022   Procedure: ESOPHAGOGASTRODUODENOSCOPY (EGD) WITH PROPOFOL ;  Surgeon: Saintclair Jasper, MD;  Location: [facility];  Service: Gastroenterology;  Laterality: N/A;   HOLMIUM LASER APPLICATION Left 03/06/2019   Procedure: HOLMIUM LASER APPLICATION;  Surgeon: Elisabeth Valli BIRCH, MD;  Location: [facility];  Service: Urology;  Laterality: Left;   implantable loop recorder placement  10/20/2020   Medtronic Reveal Linq model LNQ 11 (SN [id number]) implantable loop recorder implanted for Alleviate HF trial   NASAL SEPTUM SURGERY     POLYPECTOMY  11/17/2017   Procedure: POLYPECTOMY;  Surgeon: Saintclair Jasper, MD;  Location: [facility];  Service: Gastroenterology;;   POLYPECTOMY  07/13/2022   Procedure: POLYPECTOMY;  Surgeon: Saintclair Jasper, MD;  Location: [facility];  Service: Gastroenterology;;   TONSILLECTOMY       No outpatient medications have been marked as taking for the 08/03/23 encounter (Office Visit) with Shlomo Wilbert SAUNDERS, MD.     Allergies:   Diphenhydramine and No healthtouch food allergies   Social History   Tobacco Use    Smoking status: Never   Smokeless tobacco: Never   Tobacco comments:    Never smoke 05/19/21  Vaping Use   Vaping status: Never Used  Substance Use Topics   Alcohol  use: Not Currently   Drug use: No     Family Hx: The patient's family history includes CAD in her mother; Cancer in her father; Diabetes in her brother and father; Heart failure in her father; Hypertension in her brother. There is no history of Breast cancer.  ROS:   Please see the history of present illness.     All other systems reviewed and are negative.   Prior CV studies:   The following studies were reviewed today:  PAP compliance download  Labs/Other Tests and Data Reviewed:    EKG Interpretation Date/Time:  Wednesday August 03 2023 13:42:31 EDT Ventricular Rate:  63 PR Interval:  182 QRS Duration:  114 QT Interval:  488 QTC Calculation: 499 R Axis:   -60  Text Interpretation: Normal sinus rhythm Left anterior fascicular block Minimal voltage criteria for LVH, may be normal variant ( Cornell product ) Possible Anterior infarct (cited on or before 23-Aug-2022) When compared with ECG of 01-Jun-2023 13:59,  Premature atrial complexes are no longer Present Questionable change in initial forces of Septal leads Confirmed by Shlomo Corning [phone]) on 08/03/2023 2:05:20 PM    Recent Labs: 02/25/2023: ALT 16; Hemoglobin 13.7; Platelets 289 06/01/2023: BUN 9; Creatinine, Ser 0.95; Magnesium 2.4; Potassium 3.9; Sodium 139   Recent Lipid Panel No results found for: CHOL, TRIG, HDL, CHOLHDL, LDLCALC, LDLDIRECT  Wt Readings from Last 3 Encounters:  08/03/23 248 lb (112.5 kg)  06/01/23 247 lb 3.2 oz (112.1 kg)  02/25/23 259 lb (117.5 kg)     Risk Assessment/Calculations:    CHA2DS2-VASc Score = 5   This indicates a 7.2% annual risk of stroke. The patient's score is based upon: CHF History: 1 HTN History: 1 Diabetes History: 0 Stroke History: 0 Vascular Disease History: 0 Age Score: 2 Gender  Score: 1         Objective:    Vital Signs:  BP 120/70   Pulse 63   Ht 5' 2 (1.575 m)   Wt 248 lb (112.5 kg)   SpO2 98%   BMI 45.36 kg/m   GEN: Well nourished, well developed in no acute distress HEENT: Normal NECK: No JVD; No carotid bruits LYMPHATICS: No lymphadenopathy CARDIAC:RRR, no murmurs, rubs, gallops RESPIRATORY:  Clear to auscultation without rales, wheezing or rhonchi  ABDOMEN: Soft, non-tender, non-distended MUSCULOSKELETAL:  No edema; No deformity  SKIN: Warm and dry NEUROLOGIC:  Alert and oriented x 3 PSYCHIATRIC:  Normal affect  ASSESSMENT & PLAN:    OSA - The patient is tolerating PAP therapy well without any problems. The PAP download performed by his DME was personally reviewed and interpreted by me today and showed an AHI of 0.9 /hr on auto CPAP from 4-18 cm H2O with 83% compliance in using more than 4 hours nightly.  The patient has been using and benefiting from PAP use and will continue to benefit from therapy.   Essential hypertension  - BP is adequately controlled - Continue carvedilol  3.125 mg twice daily, spironolactone  12.5 mg daily with as needed refills   Pericardial effusion -stable small effusion on echo 03/2021 -Echo 6/24 EF 55 to 60% with small pericardial effusion -Repeat  echo 07/29/2023 with persistent small pericardial effusion   Atypical chest pain  -Her chest pain in the past has been atypical being sharp and nonexertional in the past -she had a normal coronary CTA in 2018 with coronary Ca score 0 and CP felt not related to coronary ischemia -she has a hx of esophageal dilatation and has felt that her chest pain has been GI in the past -She has had a small pericardial effusion on echo and we tried her on colchicine  but she decided not to take it -she has continue to have CP even after she had botox  injections in her esophagus but has continued to have CP at night. She is very worried that her CP may be angina -I will repeat a coronary  CTA to make sure no CAD and developed   Chronic LE edema/Chronic diastolic CHF - She appears euvolemic on exam today -Continue carvedilol  3.125 mg twice daily, Lasix  40 mg daily, spironolactone  12.5 mg daily with as needed refills -Avoiding SGLT2i due to obesity and advanced age with increased risk of UTI  PAF -She is maintaining normal sinus rhythm on exam today denies any palpitations -No bleeding problems on DOAC -Continue carvedilol  3.25 mg twice daily, dofetilide  500 mcg twice daily, Xarelto  20 mg daily with as needed refills -followed in afib clinic -I  have personally reviewed and interpreted outside labs performed by patient's send yeah okay yeah okay PCP which showed Hbg 13.7 on 02/25/2023, SCr 0.95 and K+ 3.9 on 06/01/2023    And in 1 year followup   Medication Adjustments/Labs and Tests Ordered: Current medicines are reviewed at length with the patient today.  Concerns regarding medicines are outlined above.   Tests Ordered: Orders Placed This Encounter  Procedures   EKG 12-Lead    Medication Changes: No orders of the defined types were placed in this encounter.   Follow Up:  In Person in 1 year(s)  Signed, Wilbert Bihari, MD  08/03/2023 1:52 PM    [facility]

## 2023-08-04 ENCOUNTER — Telehealth: Payer: Self-pay | Admitting: *Deleted

## 2023-08-04 ENCOUNTER — Ambulatory Visit: Payer: Self-pay | Admitting: Cardiology

## 2023-08-04 DIAGNOSIS — I2510 Atherosclerotic heart disease of native coronary artery without angina pectoris: Secondary | ICD-10-CM

## 2023-08-04 DIAGNOSIS — Z79899 Other long term (current) drug therapy: Secondary | ICD-10-CM

## 2023-08-04 LAB — BASIC METABOLIC PANEL WITH GFR
BUN/Creatinine Ratio: 9 — ABNORMAL LOW (ref 12–28)
BUN: 9 mg/dL (ref 8–27)
CO2: 22 mmol/L (ref 20–29)
Calcium: 9.4 mg/dL (ref 8.7–10.3)
Chloride: 103 mmol/L (ref 96–106)
Creatinine, Ser: 0.99 mg/dL (ref 0.57–1.00)
Glucose: 96 mg/dL (ref 70–99)
Potassium: 4.5 mmol/L (ref 3.5–5.2)
Sodium: 140 mmol/L (ref 134–144)
eGFR: 59 mL/min/{1.73_m2} — ABNORMAL LOW (ref >59)

## 2023-08-04 NOTE — Telephone Encounter (Signed)
-----   Message from Katelyn C sent at 08/04/2023  9:48 AM EDT ----- Regarding: RE: CARDIAC CT PER DR TURNER Good Morning,   Patient is scheduled for 08/16/23. ----- Message ----- From: Gladis Porter HERO, LPN Sent: 3/74/7974   2:31 PM EDT To: Alan Pho; Brittany L Lynch; Katelyn J C# Subject: CARDIAC CT PER DR TURNER                       Dr. Shlomo ordered a Cardiac CT on this pt for chest pain  Order is in  Will get bmet on her today  Please pre-cert test  Can you please schedule and send Geni Sar RN the date for her follow-up thereafter?  Thanks so much, [employer]

## 2023-08-05 NOTE — Telephone Encounter (Signed)
 Patient is following up regarding lab results. She would like to discuss BUN/creatinine specifically. She says she doesn't know what it is. Please advise.

## 2023-08-09 DIAGNOSIS — I2510 Atherosclerotic heart disease of native coronary artery without angina pectoris: Secondary | ICD-10-CM | POA: Insufficient documentation

## 2023-08-10 DIAGNOSIS — M545 Low back pain, unspecified: Secondary | ICD-10-CM | POA: Diagnosis not present

## 2023-08-10 DIAGNOSIS — Z6841 Body Mass Index (BMI) 40.0 and over, adult: Secondary | ICD-10-CM | POA: Diagnosis not present

## 2023-08-15 ENCOUNTER — Telehealth (HOSPITAL_COMMUNITY): Payer: Self-pay | Admitting: *Deleted

## 2023-08-15 NOTE — Telephone Encounter (Signed)
Reaching out to patient to offer assistance regarding upcoming cardiac imaging study; pt verbalizes understanding of appt date/time, parking situation and where to check in, pre-test NPO status  and verified current allergies; name and call back number provided for further questions should they arise ? ?Adalaya Irion RN Navigator Cardiac Imaging ?[facility] ?[phone] office ?[phone] cell ? ?

## 2023-08-16 ENCOUNTER — Ambulatory Visit (HOSPITAL_COMMUNITY)
Admission: RE | Admit: 2023-08-16 | Discharge: 2023-08-16 | Disposition: A | Discharge status: Home / Self Care | Source: Ambulatory Visit | Attending: Cardiology | Admitting: Cardiology

## 2023-08-16 DIAGNOSIS — R072 Precordial pain: Secondary | ICD-10-CM

## 2023-08-16 DIAGNOSIS — I2510 Atherosclerotic heart disease of native coronary artery without angina pectoris: Secondary | ICD-10-CM

## 2023-08-16 DIAGNOSIS — I517 Cardiomegaly: Secondary | ICD-10-CM | POA: Diagnosis not present

## 2023-08-16 DIAGNOSIS — Z01812 Encounter for preprocedural laboratory examination: Secondary | ICD-10-CM | POA: Diagnosis not present

## 2023-08-16 DIAGNOSIS — I3139 Other pericardial effusion (noninflammatory): Secondary | ICD-10-CM | POA: Diagnosis not present

## 2023-08-16 DIAGNOSIS — I4819 Other persistent atrial fibrillation: Secondary | ICD-10-CM | POA: Diagnosis not present

## 2023-08-16 DIAGNOSIS — K449 Diaphragmatic hernia without obstruction or gangrene: Secondary | ICD-10-CM | POA: Insufficient documentation

## 2023-08-16 DIAGNOSIS — I5032 Chronic diastolic (congestive) heart failure: Secondary | ICD-10-CM | POA: Insufficient documentation

## 2023-08-16 DIAGNOSIS — G4733 Obstructive sleep apnea (adult) (pediatric): Secondary | ICD-10-CM | POA: Diagnosis not present

## 2023-08-16 DIAGNOSIS — I10 Essential (primary) hypertension: Secondary | ICD-10-CM

## 2023-08-16 DIAGNOSIS — R079 Chest pain, unspecified: Secondary | ICD-10-CM

## 2023-08-16 DIAGNOSIS — Z79899 Other long term (current) drug therapy: Secondary | ICD-10-CM | POA: Diagnosis not present

## 2023-08-16 DIAGNOSIS — R0789 Other chest pain: Secondary | ICD-10-CM

## 2023-08-16 MED ORDER — NITROGLYCERIN 0.4 MG SL SUBL
0.8000 mg | SUBLINGUAL_TABLET | Freq: Once | SUBLINGUAL | Status: AC
  Administered 2023-08-16: 0.8 mg via SUBLINGUAL

## 2023-08-16 MED ORDER — IOHEXOL 350 MG/ML SOLN
100.0000 mL | Freq: Once | INTRAVENOUS | Status: AC | PRN
  Administered 2023-08-16: 100 mL via INTRAVENOUS

## 2023-08-17 ENCOUNTER — Encounter: Payer: Self-pay | Admitting: Cardiology

## 2023-08-17 ENCOUNTER — Ambulatory Visit (HOSPITAL_COMMUNITY)
Admission: RE | Admit: 2023-08-17 | Discharge: 2023-08-17 | Disposition: A | Discharge status: Home / Self Care | Source: Ambulatory Visit | Attending: Cardiology | Admitting: Cardiology

## 2023-08-17 ENCOUNTER — Other Ambulatory Visit: Payer: Self-pay | Admitting: Cardiology

## 2023-08-17 DIAGNOSIS — R931 Abnormal findings on diagnostic imaging of heart and coronary circulation: Secondary | ICD-10-CM | POA: Diagnosis not present

## 2023-08-24 NOTE — Telephone Encounter (Signed)
 Call to patient to discuss CTA results. Discussed:  Coronary CTA showed cor Cal score of 3, <25% prox OM/mid LCx, 50% mid to distal LAD>>nonobstructive CAD by FFR.  Also has hiatal hernia with esophageal wall thickening which could represent  esophagitis.  No ASA due to DOAC. Have her come in for FLP and ALT.  Please refer to Dr. Saintclair if she does not have a GI MD.  If she already has a GI MD then get back in with them due to possible  esophagitis   Patient says she sees Dr. Saintclair already and also sees a Dr. Filbert at [facility], who has treated her esophagitis and achalasia. She requests that we send copy of this report to Dr. Saintclair, which I have done.  Patient also agrees to complete FLP/ALT within next  2 weeks, orders place.d

## 2023-08-24 NOTE — Telephone Encounter (Signed)
-----   Message from Wilbert Bihari sent at 08/17/2023  3:31 PM EDT ----- Coronary CTA showed cor Cal score of 3, <25% prox OM/mid LCx, 50% mid to distal LAD>>nonobstructive CAD by FFR.  Also has hiatal hernia with esophageal wall thickening which could represent  esophagitis.  No ASA due to DOAC. Have her come in for FLP and ALT.  Please refer to Dr. Saintclair if she does not have a GI MD.  If she already has a GI MD then get back in with them due to possible  esophagitis ----- Message ----- From: Interface, Rad Results In Sent: 08/17/2023  11:43 AM EDT To: Wilbert JONELLE Bihari, MD

## 2023-09-10 ENCOUNTER — Other Ambulatory Visit: Payer: Self-pay | Admitting: Cardiology

## 2023-09-11 ENCOUNTER — Other Ambulatory Visit: Payer: Self-pay | Admitting: Cardiology

## 2023-09-27 DIAGNOSIS — E119 Type 2 diabetes mellitus without complications: Secondary | ICD-10-CM | POA: Diagnosis not present

## 2023-10-25 ENCOUNTER — Other Ambulatory Visit (HOSPITAL_BASED_OUTPATIENT_CLINIC_OR_DEPARTMENT_OTHER)

## 2023-10-25 ENCOUNTER — Ambulatory Visit (HOSPITAL_BASED_OUTPATIENT_CLINIC_OR_DEPARTMENT_OTHER): Admitting: Radiology

## 2023-11-10 ENCOUNTER — Other Ambulatory Visit (HOSPITAL_COMMUNITY): Payer: Self-pay | Admitting: *Deleted

## 2023-11-10 DIAGNOSIS — I4819 Other persistent atrial fibrillation: Secondary | ICD-10-CM

## 2023-11-10 DIAGNOSIS — Z006 Encounter for examination for normal comparison and control in clinical research program: Secondary | ICD-10-CM

## 2023-11-10 MED ORDER — XARELTO 20 MG PO TABS: 20.0000 mg | ORAL_TABLET | Freq: Every day | ORAL | 2 refills | Status: DC

## 2023-11-10 MED ORDER — DOFETILIDE 500 MCG PO CAPS: 500.0000 ug | ORAL_CAPSULE | Freq: Two times a day (BID) | ORAL | 2 refills | Status: AC

## 2023-11-10 MED ORDER — DOFETILIDE 500 MCG PO CAPS: 500.0000 ug | ORAL_CAPSULE | Freq: Two times a day (BID) | ORAL | 2 refills | Status: DC

## 2023-11-10 MED ORDER — FUROSEMIDE 40 MG PO TABS: ORAL_TABLET | ORAL | 2 refills | Status: DC

## 2023-11-16 ENCOUNTER — Ambulatory Visit (INDEPENDENT_AMBULATORY_CARE_PROVIDER_SITE_OTHER)

## 2023-11-16 ENCOUNTER — Encounter

## 2023-11-16 DIAGNOSIS — I4819 Other persistent atrial fibrillation: Secondary | ICD-10-CM | POA: Diagnosis not present

## 2023-11-17 ENCOUNTER — Telehealth: Payer: Self-pay | Admitting: Cardiology

## 2023-11-17 DIAGNOSIS — M810 Age-related osteoporosis without current pathological fracture: Secondary | ICD-10-CM | POA: Diagnosis not present

## 2023-11-17 DIAGNOSIS — E119 Type 2 diabetes mellitus without complications: Secondary | ICD-10-CM | POA: Diagnosis not present

## 2023-11-17 DIAGNOSIS — E785 Hyperlipidemia, unspecified: Secondary | ICD-10-CM | POA: Diagnosis not present

## 2023-11-17 DIAGNOSIS — F3340 Major depressive disorder, recurrent, in remission, unspecified: Secondary | ICD-10-CM | POA: Diagnosis not present

## 2023-11-17 DIAGNOSIS — Z1331 Encounter for screening for depression: Secondary | ICD-10-CM | POA: Diagnosis not present

## 2023-11-17 DIAGNOSIS — Z0000 Encounter for general adult medical examination without abnormal findings: Secondary | ICD-10-CM | POA: Diagnosis not present

## 2023-11-17 DIAGNOSIS — F5101 Primary insomnia: Secondary | ICD-10-CM | POA: Diagnosis not present

## 2023-11-17 DIAGNOSIS — Z23 Encounter for immunization: Secondary | ICD-10-CM | POA: Diagnosis not present

## 2023-11-17 LAB — CUP PACEART REMOTE DEVICE CHECK
Date Time Interrogation Session: 20251008115132
Implantable Pulse Generator Implant Date: 20220912

## 2023-11-17 NOTE — Telephone Encounter (Signed)
 Patient called and stated that she would like to speak to [facility].  She stated she had trees fall on her house and has had a lot going on.  She has not really use the cpap consistently. She stated she will be out most of the afternoon.    Best number [phone].

## 2023-11-18 NOTE — Progress Notes (Signed)
 Remote Loop Recorder Transmission

## 2023-11-21 ENCOUNTER — Ambulatory Visit: Payer: Self-pay | Admitting: Cardiovascular Disease

## 2023-12-06 ENCOUNTER — Ambulatory Visit (HOSPITAL_COMMUNITY): Admitting: Physician Assistant

## 2023-12-10 ENCOUNTER — Other Ambulatory Visit (HOSPITAL_BASED_OUTPATIENT_CLINIC_OR_DEPARTMENT_OTHER)

## 2023-12-10 ENCOUNTER — Ambulatory Visit (HOSPITAL_BASED_OUTPATIENT_CLINIC_OR_DEPARTMENT_OTHER)
Admission: RE | Admit: 2023-12-10 | Discharge: 2023-12-10 | Disposition: A | Discharge status: Home / Self Care | Source: Ambulatory Visit | Attending: Family Medicine | Admitting: Family Medicine

## 2023-12-10 DIAGNOSIS — Z780 Asymptomatic menopausal state: Secondary | ICD-10-CM | POA: Diagnosis not present

## 2023-12-10 DIAGNOSIS — M810 Age-related osteoporosis without current pathological fracture: Secondary | ICD-10-CM | POA: Diagnosis not present

## 2023-12-10 DIAGNOSIS — M85832 Other specified disorders of bone density and structure, left forearm: Secondary | ICD-10-CM | POA: Diagnosis not present

## 2023-12-13 ENCOUNTER — Ambulatory Visit (HOSPITAL_COMMUNITY)
Admission: RE | Admit: 2023-12-13 | Discharge: 2023-12-13 | Disposition: A | Discharge status: Home / Self Care | Source: Ambulatory Visit | Attending: Physician Assistant | Admitting: Physician Assistant

## 2023-12-13 VITALS — BP 136/64 | HR 63 | Ht 62.0 in | Wt 258.0 lb

## 2023-12-13 DIAGNOSIS — I4819 Other persistent atrial fibrillation: Secondary | ICD-10-CM | POA: Diagnosis not present

## 2023-12-13 DIAGNOSIS — I4891 Unspecified atrial fibrillation: Secondary | ICD-10-CM | POA: Insufficient documentation

## 2023-12-13 DIAGNOSIS — D6869 Other thrombophilia: Secondary | ICD-10-CM | POA: Insufficient documentation

## 2023-12-13 DIAGNOSIS — Z79899 Other long term (current) drug therapy: Secondary | ICD-10-CM | POA: Insufficient documentation

## 2023-12-13 DIAGNOSIS — Z5181 Encounter for therapeutic drug level monitoring: Secondary | ICD-10-CM | POA: Diagnosis not present

## 2023-12-13 NOTE — Progress Notes (Signed)
 Primary Care Physician: Cleotilde Planas, MD Primary Cardiologist: Dr Karen Primary Electrophysiologist: previously Dr Kelsie  Referring Physician: Dr Karen Rasmussen Karen is a 78 y.o. female with a history of HTN, DM, CAD, chronic diastolic CHF, atrial fibrillation who presents for follow up in the [facility]. Seen previously by Karen Rasmussen in 2018. She has been maintained on sotalol  since 2018. Patient is on Xarelto  for a CHADS2VASC score of 5. She was seen by Dr Karen on 05/13/20 and was found to be back in afib. She admits she had stayed up late for a few nights and had not used her CPAP. She has noticed increased fatigue since then. Patient is s/p DCCV 06/03/20. She had an echocardiogram on 07/18/20 and was noted to be back in afib. Patient is s/p dofetilide  admission 7/19-7/22/22. She converted with the medication and did not require DCCV.   Patient returns for follow up for atrial fibrillation and dofetilide  monitoring. She is in SR today and feels well. ILR shows 1.2% afib burden, last episode occurring in August. No bleeding issues on anticoagulation.   Today, she  denies symptoms of palpitations, chest pain, shortness of breath, orthopnea, PND, lower extremity edema, dizziness, presyncope, syncope, bleeding, or neurologic sequela. The patient is tolerating medications without difficulties and is otherwise without complaint today.    Atrial Fibrillation Risk Factors:  she does have symptoms or diagnosis of sleep apnea. she does not have a history of rheumatic fever.   Atrial Fibrillation Management history:  Previous antiarrhythmic drugs: sotalol , dofetilide   Previous cardioversions: 2018 x 3, 06/03/20 Previous ablations: none Anticoagulation history: Xarelto     Past Medical History:  Diagnosis Date   Abdominal pain, left lower quadrant    Asthmatic bronchitis    Benign essential HTN 04/26/2016   Bursitis of hip    CAD (coronary artery disease),  native coronary artery 08/2023   cor Cal score of 3, <25% prox OM/mid LCx, 50% mid to distal LAD>>nonobstructive CAD by FFR.   Chronic diastolic CHF (congestive heart failure) (HCC)    DCM (dilated cardiomyopathy) (HCC)    EF 50-55% in Jan 2023   GERD (gastroesophageal reflux disease)    High cholesterol    History of kidney stones    Hypersomnia    Morbid obesity with BMI of 50.0-59.9, adult (HCC)    OSA on CPAP    Osteoarthritis    right hip; both knees (09/21/2016)   Persistent atrial fibrillation (HCC) 04/26/2016   Type II diabetes mellitus (HCC)    Vitamin D  deficiency disease     Current Outpatient Medications  Medication Sig Dispense Refill   alendronate  (FOSAMAX ) 70 MG tablet Take 70 mg by mouth every Sunday. Take with a full glass of water on an empty stomach.     betamethasone  dipropionate 0.05 % cream Apply 1 application  topically 2 (two) times daily as needed (skin irritation.).     Biotin w/ Vitamins C & E (HAIR SKIN & NAILS GUMMIES PO) Take 2 tablets by mouth daily.     blood glucose meter kit and supplies Dispense based on patient and insurance preference. Use up to four times daily as directed. (FOR ICD-10 E10.9, E11.9). 1 each 0   Calcium -Vitamin D -Vitamin K (CALCIUM  + D + K PO) Take 1 tablet by mouth in the morning.     carvedilol  (COREG ) 3.125 MG tablet TAKE 1 TABLET BY MOUTH TWICE DAILY WITH A MEAL 180 tablet 2   Cholecalciferol  (VITAMIN  D-3) 125 MCG (5000 UT) TABS Take 5,000 Units by mouth in the morning.     dofetilide  (TIKOSYN ) 500 MCG capsule Take 1 capsule (500 mcg total) by mouth 2 (two) times daily. 180 capsule 2   fluocinolone (SYNALAR) 0.01 % external solution Apply 1 application topically 2 (two) times daily as needed (skin irritation).     folic acid  (FOLVITE ) 800 MCG tablet Take 800 mcg by mouth in the morning.     furosemide  (LASIX ) 40 MG tablet Take 1 tablet (40 MG) daily. As needed patient may take 40 MG additional Lasix  PRN by mouth daily x 4 days  as directed per Alleviate Research HF Study PRN plan. 180 tablet 2   gabapentin  (NEURONTIN ) 300 MG capsule Take 300 mg by mouth at bedtime.     HYDROcodone  bit-homatropine (HYCODAN) 5-1.5 MG/5ML syrup Take 5 mLs by mouth daily as needed for cough. (Patient taking differently: Take 5 mLs by mouth as needed for cough.)     HYDROcodone -acetaminophen  (NORCO) 5-325 MG tablet Take 1 tablet by mouth every 4 (four) hours as needed for moderate pain (kidney stone pain). 20 tablet 0   hyoscyamine (LEVSIN SL) 0.125 MG SL tablet Place under the tongue. (Patient taking differently: Place 0.125 mg under the tongue as needed.)     Insulin  Glargine (BASAGLAR  KWIKPEN [place]) Inject 40 Units into the skin in the morning.     Insulin  Pen Needle 31G X 5 MM MISC 12 Units by Does not apply route 3 (three) times daily. 100 each 0   Lancets (ONETOUCH DELICA PLUS LANCET33G) MISC Apply topically 3 (three) times daily.     methocarbamol (ROBAXIN) 500 MG tablet Take 500 mg by mouth as needed.     ONETOUCH VERIO test strip SMARTSIG:Via Meter 3-4 Times Daily PRN     pantoprazole  (PROTONIX ) 40 MG tablet Take 40 mg by mouth 2 (two) times daily.     Polyethyl Glycol-Propyl Glycol (SYSTANE) 0.4-0.3 % SOLN Place 1-2 drops into both eyes 3 (three) times daily as needed (dry/irritated eyes).     potassium chloride  (KLOR-CON  M10) 10 MEQ tablet TAKE 2 TABLETS BY MOUTH TWICE A DAY 360 tablet 3   psyllium (METAMUCIL SMOOTH TEXTURE) 28 % packet Take 1 packet by mouth daily as needed (regularity).     Semaglutide,0.25 or 0.5MG /DOS, (OZEMPIC, 0.25 OR 0.5 MG/DOSE,) 2 MG/3ML SOPN Inject 1 mg into the skin every Saturday.     simvastatin  (ZOCOR ) 10 MG tablet Take 1 tablet (10 mg total) by mouth at bedtime. 90 tablet 1   sodium chloride  (OCEAN) 0.65 % SOLN nasal spray Place 2 sprays into both nostrils in the morning and at bedtime. 66 mL 3   spironolactone  (ALDACTONE ) 25 MG tablet TAKE 1/2 TABLET BY MOUTH DAILY 45 tablet 3   triamcinolone cream  (KENALOG) 0.1 % Apply 1 application topically 2 (two) times daily as needed (itching legs).      XARELTO  20 MG TABS tablet Take 1 tablet (20 mg total) by mouth daily. 90 tablet 2   FLUoxetine (PROZAC) 20 MG capsule Take 20 mg by mouth daily. (Patient not taking: Reported on 12/13/2023)     Multiple Vitamins-Minerals (CENTRUM SILVER ) CHEW Chew 2 tablets by mouth daily. soft chews (Patient not taking: Reported on 12/13/2023)     No current facility-administered medications for this encounter.    ROS- All systems are reviewed and negative except as per the HPI above.  Physical Exam: Vitals:   12/13/23 1416  BP: 136/64  Pulse:  63  Weight: 117 kg  Height: 5' 2 (1.575 m)    GEN: Well nourished, well developed in no acute distress CARDIAC: Regular rate and rhythm, no murmurs, rubs, gallops RESPIRATORY:  Clear to auscultation without rales, wheezing or rhonchi  ABDOMEN: Soft, non-tender, non-distended EXTREMITIES:  No edema; No deformity    Wt Readings from Last 3 Encounters:  12/13/23 117 kg  08/03/23 112.5 kg  06/01/23 112.1 kg    EKG today demonstrates  SR, LAFB Vent. rate 63 BPM PR interval 166 ms QRS duration 110 ms QT/QTcB 484/496 ms   Echo 07/12/23 demonstrated    1. Left ventricular ejection fraction, by estimation, is 50%. The left  ventricle has mildly decreased function. The left ventricle demonstrates  global hypokinesis. Left ventricular diastolic parameters are consistent  with Grade II diastolic dysfunction (pseudonormalization).   2. Right ventricular systolic function is normal. The right ventricular  size is normal. Tricuspid regurgitation signal is inadequate for assessing  PA pressure.   3. Left atrial size was mildly dilated.   4. The aortic valve is tricuspid. There is mild calcification of the  aortic valve. Aortic valve regurgitation is not visualized. No aortic  stenosis is present.   5. The mitral valve is normal in structure. No evidence of mitral  valve  regurgitation. No evidence of mitral stenosis.   6. The inferior vena cava is normal in size with greater than 50%  respiratory variability, suggesting right atrial pressure of 3 mmHg.   7. A small pericardial effusion is present. The pericardial effusion is  localized near the right ventricle. There is no evidence of cardiac  tamponade.    Epic records are reviewed at length today   CHA2DS2-VASc Score = 5  The patient's score is based upon: CHF History: 1 HTN History: 1 Diabetes History: 0 Stroke History: 0 Vascular Disease History: 0 Age Score: 2 Gender Score: 1       ASSESSMENT AND PLAN: Persistent Atrial Fibrillation (ICD10:  I48.19) The patient's CHA2DS2-VASc score is 5, indicating a 7.2% annual risk of stroke.   S/p dofetilide  loading 08/2020 ILR shows 1.2% afib burden, none since August.  Continue dofetilide  500 mcg BID Continue Xarelto  20 mg daily Continue carvedilol  3.125 mg BID  Secondary Hypercoagulable State (ICD10:  D68.69) The patient is at significant risk for stroke/thromboembolism based upon her CHA2DS2-VASc Score of 5.  Continue Rivaroxaban  (Xarelto ). No bleeding issues.   High Risk Medication Monitoring (ICD 10: [id number]) Patient requires ongoing monitoring for anti-arrhythmic medication which has the potential to cause life threatening arrhythmias. QT interval on ECG acceptable for dofetilide  monitoring. Check bmet/mag today.     Obesity Body mass index is 47.19 kg/m.  Encouraged lifestyle modification On Ozempic  OSA  Encouraged nightly CPAP  HTN Stable on current regimen  Chronic HFpEF EF 50% GDMT per primary cardiology team Fluid status appears stable today  CAD No anginal symptoms Followed by Dr Karen   Follow up in the AF clinic in 6 months.    Daril Kicks PA-C [facility] [address] [place], [place] [address] [phone] 12/13/2023 2:50 PM

## 2023-12-14 ENCOUNTER — Ambulatory Visit (HOSPITAL_COMMUNITY): Payer: Self-pay | Admitting: Physician Assistant

## 2023-12-14 LAB — MAGNESIUM: Magnesium: 2.3 mg/dL (ref 1.6–2.3)

## 2023-12-14 LAB — BASIC METABOLIC PANEL WITH GFR
BUN/Creatinine Ratio: 15 (ref 12–28)
BUN: 13 mg/dL (ref 8–27)
CO2: 27 mmol/L (ref 20–29)
Calcium: 9.5 mg/dL (ref 8.7–10.3)
Chloride: 99 mmol/L (ref 96–106)
Creatinine, Ser: 0.88 mg/dL (ref 0.57–1.00)
Glucose: 111 mg/dL — ABNORMAL HIGH (ref 70–99)
Potassium: 4.3 mmol/L (ref 3.5–5.2)
Sodium: 138 mmol/L (ref 134–144)
eGFR: 68 mL/min/1.73 (ref >59)

## 2023-12-17 ENCOUNTER — Encounter

## 2023-12-17 ENCOUNTER — Ambulatory Visit (INDEPENDENT_AMBULATORY_CARE_PROVIDER_SITE_OTHER)

## 2023-12-17 DIAGNOSIS — I4819 Other persistent atrial fibrillation: Secondary | ICD-10-CM | POA: Diagnosis not present

## 2023-12-18 LAB — CUP PACEART REMOTE DEVICE CHECK
Date Time Interrogation Session: 20251108105407
Implantable Pulse Generator Implant Date: 20220912

## 2023-12-21 ENCOUNTER — Ambulatory Visit: Payer: Self-pay | Admitting: Cardiovascular Disease

## 2023-12-21 NOTE — Progress Notes (Signed)
 Remote Loop Recorder Transmission

## 2023-12-27 DIAGNOSIS — M5459 Other low back pain: Secondary | ICD-10-CM | POA: Diagnosis not present

## 2023-12-27 DIAGNOSIS — M1611 Unilateral primary osteoarthritis, right hip: Secondary | ICD-10-CM | POA: Diagnosis not present

## 2023-12-27 DIAGNOSIS — M51362 Other intervertebral disc degeneration, lumbar region with discogenic back pain and lower extremity pain: Secondary | ICD-10-CM | POA: Diagnosis not present

## 2023-12-27 DIAGNOSIS — M25551 Pain in right hip: Secondary | ICD-10-CM | POA: Diagnosis not present

## 2023-12-27 DIAGNOSIS — M79604 Pain in right leg: Secondary | ICD-10-CM | POA: Diagnosis not present

## 2023-12-28 ENCOUNTER — Other Ambulatory Visit (HOSPITAL_COMMUNITY): Payer: Self-pay

## 2023-12-28 DIAGNOSIS — M25551 Pain in right hip: Secondary | ICD-10-CM

## 2024-01-09 ENCOUNTER — Telehealth: Payer: Self-pay | Admitting: Cardiology

## 2024-01-09 DIAGNOSIS — I4891 Unspecified atrial fibrillation: Secondary | ICD-10-CM

## 2024-01-09 NOTE — Telephone Encounter (Signed)
 Pt called in and stated she is noticing more episode on her app.  She stated she has been under a lot of stress since June.  She had trees fall on her house. She stated she has not been eating and sleeping much.  She would like to speak to a nurse   Best number [phone]

## 2024-01-10 ENCOUNTER — Ambulatory Visit (HOSPITAL_COMMUNITY): Admission: RE | Admit: 2024-01-10 | Discharge: 2024-01-10 | Disposition: A | Source: Ambulatory Visit

## 2024-01-10 ENCOUNTER — Ambulatory Visit

## 2024-01-10 DIAGNOSIS — I4891 Unspecified atrial fibrillation: Secondary | ICD-10-CM | POA: Diagnosis not present

## 2024-01-10 DIAGNOSIS — M25551 Pain in right hip: Secondary | ICD-10-CM | POA: Diagnosis not present

## 2024-01-10 DIAGNOSIS — M48061 Spinal stenosis, lumbar region without neurogenic claudication: Secondary | ICD-10-CM | POA: Diagnosis not present

## 2024-01-10 DIAGNOSIS — M51369 Other intervertebral disc degeneration, lumbar region without mention of lumbar back pain or lower extremity pain: Secondary | ICD-10-CM | POA: Diagnosis not present

## 2024-01-10 DIAGNOSIS — M2548 Effusion, other site: Secondary | ICD-10-CM | POA: Diagnosis not present

## 2024-01-10 DIAGNOSIS — M4319 Spondylolisthesis, multiple sites in spine: Secondary | ICD-10-CM | POA: Diagnosis not present

## 2024-01-10 NOTE — Progress Notes (Unsigned)
 Enrolled patient for a 14 day Zio XT  monitor to be mailed to patients home

## 2024-01-10 NOTE — Telephone Encounter (Signed)
 Spoke with patient regarding afib episodes. Pt states she has been under a lot of stress since June with trees falling on her house and trying to get all of that cleaned up and finished. Pt also states she only notices the episodes at night when she is wearing her cpap and does not have any episodes during the day that she is aware of. Denies SOB. C/o decreased appetite and doesn't sleep as well as she used to but reinforces multiple times the amount of stress she is under. Will route to Dr. Shlomo and afib clinic for any further recommendations. Pt verbalizes understanding.

## 2024-01-17 ENCOUNTER — Telehealth: Payer: Self-pay | Admitting: Cardiology

## 2024-01-17 ENCOUNTER — Encounter

## 2024-01-17 ENCOUNTER — Ambulatory Visit

## 2024-01-17 DIAGNOSIS — I4819 Other persistent atrial fibrillation: Secondary | ICD-10-CM | POA: Diagnosis not present

## 2024-01-17 NOTE — Telephone Encounter (Signed)
 Called and spoke to pt. She has spoken to Dr. Basilia who ordered the MRI Spine. He has given her the results and he will be referring her to a Spine Specialist. They are going to start with PT to help strengthen her back. Then if needed, possibly have injections. Then the last resort would be Surgery, but she may not be a candidate for this.   She wants us  to notify Dr. Shlomo of the above. Also, she is asking if she can decrease her Lasix  down to 20 mg once daily. She has been on 40 mg once daily for about one year (maybe longer) and is c/o urinating very often and also, if she goes out, she will completely skip her Lasix  dose for the day.   She is requesting to be seen (APP okay); no openings seen on Dr. Dorine schedule.

## 2024-01-17 NOTE — Telephone Encounter (Signed)
 Call to patient to offer APP appointment on 01/26/24 at [facility] office to discuss lasix  dosing. Patient declines this location as she likes the valet parking at the [facility] better. Offered 02/17/24 with Orren Fabry, patient accepts.

## 2024-01-17 NOTE — Telephone Encounter (Signed)
 Patient calling to speak to dr turner about her MRI results. Please advise

## 2024-01-17 NOTE — Telephone Encounter (Signed)
 LVMTCB 01/17/24 at 4:34 pm

## 2024-01-18 LAB — CUP PACEART REMOTE DEVICE CHECK
Date Time Interrogation Session: 20251209105748
Implantable Pulse Generator Implant Date: 20220912

## 2024-01-18 NOTE — Progress Notes (Signed)
 Remote Loop Recorder Transmission

## 2024-01-27 ENCOUNTER — Ambulatory Visit: Payer: Self-pay | Admitting: Cardiovascular Disease

## 2024-02-07 NOTE — Therapy (Unsigned)
 " OUTPATIENT PHYSICAL THERAPY THORACOLUMBAR EVALUATION   Patient Name: Karen Rasmussen MRN: [id number] DOB:1945/03/03, 78 y.o., female Today's Date: 02/07/2024  END OF SESSION:   Past Medical History:  Diagnosis Date   Abdominal pain, left lower quadrant    Asthmatic bronchitis    Benign essential HTN 04/26/2016   Bursitis of hip    CAD (coronary artery disease), native coronary artery 08/2023   cor Cal score of 3, <25% prox OM/mid LCx, 50% mid to distal LAD>>nonobstructive CAD by FFR.   Chronic diastolic CHF (congestive heart failure) (HCC)    DCM (dilated cardiomyopathy) (HCC)    EF 50-55% in Jan 2023   GERD (gastroesophageal reflux disease)    High cholesterol    History of kidney stones    Hypersomnia    Morbid obesity with BMI of 50.0-59.9, adult (HCC)    OSA on CPAP    Osteoarthritis    right hip; both knees (09/21/2016)   Persistent atrial fibrillation (HCC) 04/26/2016   Type II diabetes mellitus (HCC)    Vitamin D  deficiency disease    Past Surgical History:  Procedure Laterality Date   ABDOMINAL HYSTERECTOMY     APPENDECTOMY     BALLOON DILATION N/A 11/17/2017   Procedure: BALLOON DILATION;  Surgeon: Saintclair Jasper, MD;  Location: [facility];  Service: Gastroenterology;  Laterality: N/A;   BIOPSY  11/17/2017   Procedure: BIOPSY;  Surgeon: Saintclair Jasper, MD;  Location: [facility];  Service: Gastroenterology;;   BIOPSY  07/13/2022   Procedure: BIOPSY;  Surgeon: Saintclair Jasper, MD;  Location: [facility];  Service: Gastroenterology;;   BOTOX  INJECTION  07/13/2022   Procedure: BOTOX  INJECTION;  Surgeon: Saintclair Jasper, MD;  Location: [facility];  Service: Gastroenterology;;   CARDIAC CATHETERIZATION     CARDIOVERSION N/A 06/11/2016   Procedure: CARDIOVERSION;  Surgeon: Maranda Leim VEAR, MD;  Location: [facility] END[facility]COPY;  Service: Cardiovascular;  Laterality: N/A;   CARDIOVERSION N/A 07/16/2016   Procedure: CARDIOVERSION;  Surgeon: Alveta Aleene PARAS, MD;  Location: [facility]  ENDOSCOPY;  Service: Cardiovascular;  Laterality: N/A;   CARDIOVERSION N/A 09/23/2016   Procedure: CARDIOVERSION;  Surgeon: Francyne Headland, MD;  Location: [facility]OSCOPY;  Service: Cardiovascular;  Laterality: N/A;   CARDIOVERSION N/A 06/03/2020   Procedure: CARDIOVERSION;  Surgeon: Kate Lonni CROME, MD;  Location: [facility] ENDOSCOPY;  Service: Cardiovascular;  Laterality: N/A;   CHOLECYSTECTOMY OPEN     COLONOSCOPY N/A 11/17/2017   Procedure: COLONOSCOPY;  Surgeon: Saintclair Jasper, MD;  Location: [facility];  Service: Gastroenterology;  Laterality: N/A;   cortisone injection to right knee     costisone injection to left knee  11/03/2017   CYSTOSCOPY WITH RETROGRADE PYELOGRAM, URETEROSCOPY AND STENT PLACEMENT Left 01/14/2019   Procedure: CYSTOSCOPy  STENT PLACEMENT;  Surgeon: Elisabeth Valli BIRCH, MD;  Location: [facility];  Service: Urology;  Laterality: Left;   CYSTOSCOPY WITH RETROGRADE PYELOGRAM, URETEROSCOPY AND STENT PLACEMENT Left 03/06/2019   Procedure: CYSTOSCOPY WITH RETROGRADE PYELOGRAM, URETEROSCOPY AND STENT PLACEMENT;  Surgeon: Elisabeth Valli BIRCH, MD;  Location: [facility];  Service: Urology;  Laterality: Left;  90 MINS   CYSTOSCOPY WITH STENT PLACEMENT Left 03/07/2019   Procedure: CYSTOSCOPY WITH , URETEROSCOPY/ STENT PLACEMENT;  Surgeon: Cam Morene ORN, MD;  Location: [facility];  Service: Urology;  Laterality: Left;   DILATION AND CURETTAGE OF UTERUS     S/P miscarriage   ESOPHAGEAL MANOMETRY N/A 08/25/2022   Procedure: ESOPHAGEAL MANOMETRY (EM);  Surgeon: Dianna Specking, MD;  Location: [facility];  Service: Gastroenterology;  Laterality: N/A;  ESOPHAGOGASTRODUODENOSCOPY N/A 11/17/2017   Procedure: ESOPHAGOGASTRODUODENOSCOPY (EGD);  Surgeon: Saintclair Jasper, MD;  Location: [facility];  Service: Gastroenterology;  Laterality: N/A;   ESOPHAGOGASTRODUODENOSCOPY (EGD) WITH PROPOFOL  N/A 07/13/2022   Procedure: ESOPHAGOGASTRODUODENOSCOPY (EGD) WITH PROPOFOL ;  Surgeon: Saintclair Jasper, MD;   Location: [facility];  Service: Gastroenterology;  Laterality: N/A;   HOLMIUM LASER APPLICATION Left 03/06/2019   Procedure: HOLMIUM LASER APPLICATION;  Surgeon: Elisabeth Valli BIRCH, MD;  Location: [facility];  Service: Urology;  Laterality: Left;   implantable loop recorder placement  10/20/2020   [employer] Reveal Linq model LNQ 11 (SN [id number]) implantable loop recorder implanted for Alleviate HF trial   NASAL SEPTUM SURGERY     POLYPECTOMY  11/17/2017   Procedure: POLYPECTOMY;  Surgeon: Saintclair Jasper, MD;  Location: [facility];  Service: Gastroenterology;;   POLYPECTOMY  07/13/2022   Procedure: POLYPECTOMY;  Surgeon: Saintclair Jasper, MD;  Location: [facility];  Service: Gastroenterology;;   TONSILLECTOMY     Patient Active Problem List   Diagnosis Date Noted   CAD (coronary artery disease), native coronary artery 08/2023   Hypercoagulable state due to persistent atrial fibrillation (HCC) 05/21/2020   Nephrolithiasis 03/07/2019   Acute pyelonephritis 01/17/2019   UTI due to extended-spectrum beta lactamase (ESBL) producing Escherichia coli    Left nephrolithiasis 01/14/2019   Hyperosmolar hyperglycemic state (HHS) (HCC) 01/13/2019   Acute renal injury    Urinary tract infection without hematuria    Elevated troponin    Person under investigation for COVID-19    Preoperative clearance 11/02/2017   Hypokalemia 09/23/2016   Encounter for monitoring dofetilide  therapy 09/21/2016   DCM (dilated cardiomyopathy) (HCC)    Benign essential HTN 04/26/2016   Persistent atrial fibrillation (HCC) 04/26/2016   Morbid obesity with BMI of 40.0-44.9, adult (HCC)    Chronic diastolic CHF (congestive heart failure) (HCC)    Abdominal pain, left lower quadrant    OSA (obstructive sleep apnea)    Benign hypertensive heart disease without heart failure    GERD (gastroesophageal reflux disease)    Hypersomnia    Depression    Asthmatic bronchitis    Bursitis of hip     PCP: Cleotilde Planas, MD    REFERRING PROVIDER: Basilia Cough, MD  REFERRING DIAG: [phone] (ICD-10-CM) - Other intervertebral disc degeneration, lumbar region with discogenic back pain and lower extremity pain M54.50 (ICD-10-CM) - Low back pain, unspecified  Rationale for Evaluation and Treatment: Rehabilitation  THERAPY DIAG:  No diagnosis found.  ONSET DATE: chronic  SUBJECTIVE:  SUBJECTIVE STATEMENT: ***  PERTINENT HISTORY:  ***  PAIN:  Are you having pain? Yes: NPRS scale: *** Pain location: *** Pain description: *** Aggravating factors: *** Relieving factors: ***  PRECAUTIONS: None  RED FLAGS: None   WEIGHT BEARING RESTRICTIONS: No  FALLS:  Has patient fallen in last 6 months? No  OCCUPATION: not working  PLOF: Independent  PATIENT GOALS: To manage my back pain  NEXT MD VISIT: TBD  OBJECTIVE:  Note: Objective measures were completed at Evaluation unless otherwise noted.  DIAGNOSTIC FINDINGS:  FINDINGS: Frontal view of the pelvis as well as frontal and frogleg lateral views of the right hip are obtained. No acute fracture, subluxation, or dislocation. There is moderate to severe bilateral hip osteoarthritis, right greater than left. Sacroiliac joints are normal. Prominent lower lumbar spondylosis and facet hypertrophy.  IMPRESSION: 1. Moderate to severe bilateral hip osteoarthritis, right greater than left. 2. No acute displaced fracture.   Electronically Signed By: Ozell Daring M.D. On: 12/31/2023 21:40   IMPRESSION: 1. 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 Subsequent L4-L5 mild spinal stenosis, moderate lateral recess stenosis and  mild neural foraminal stenosis. Query L5 radiculitis. 2. Less pronounced lumbar spine degeneration otherwise, and no other significant stenosis   Electronically signed by: Helayne Hurst MD 01/15/2024 11:31 AM EST RP Workstation: [id number]  PATIENT SURVEYS:  Modified Oswestry:   Interpretation of scores: Score Category Description  0-20% Minimal Disability The patient can cope with most living activities. Usually no treatment is indicated apart from advice on lifting, sitting and exercise  21-40% Moderate Disability The patient experiences more pain and difficulty with sitting, lifting and standing. Travel and social life are more difficult and they may be disabled from work. Personal care, sexual activity and sleeping are not grossly affected, and the patient can usually be managed by conservative means  41-60% Severe Disability Pain remains the main problem in this group, but activities of daily living are affected. These patients require a detailed investigation  61-80% Crippled Back pain impinges on all aspects of the patients life. Positive intervention is required  81-100% Bed-bound These patients are either bed-bound or exaggerating their symptoms  Bluford FORBES Zoe DELENA Karon DELENA, et al. Surgery versus conservative management of stable thoracolumbar fracture: the PRESTO feasibility RCT. [place] ([place]): [employer]; 2021 Nov. [facility] Technology Assessment, No. 25.62.) Appendix 3, Oswestry Disability Index category descriptors. Available from: Findjewelers.cz  Minimally Clinically Important Difference (MCID) = 12.8%   MUSCLE LENGTH: Hamstrings: Right *** deg; Left *** deg Debby test: Right *** deg; Left *** deg  POSTURE: {posture:25561}  PALPATION: ***  LUMBAR ROM:   AROM eval  Flexion   Extension   Right lateral flexion   Left lateral flexion   Right rotation   Left rotation    (Blank rows = not tested)  LOWER EXTREMITY ROM:      {AROM/PROM:27142}  Right eval Left eval  Hip flexion    Hip extension    Hip abduction    Hip adduction    Hip internal rotation    Hip external rotation    Knee flexion    Knee extension    Ankle dorsiflexion    Ankle plantarflexion    Ankle inversion    Ankle eversion     (Blank rows = not tested)  LOWER EXTREMITY MMT:    MMT Right eval Left eval  Hip flexion  Hip extension    Hip abduction    Hip adduction    Hip internal rotation    Hip external rotation    Knee flexion    Knee extension    Ankle dorsiflexion    Ankle plantarflexion    Ankle inversion    Ankle eversion     (Blank rows = not tested)  LUMBAR SPECIAL TESTS:  Straight leg raise test: {pos/neg:25243} and Slump test: {pos/neg:25243}  FUNCTIONAL TESTS:  30 seconds chair stand test  GAIT: Distance walked: *** Assistive device utilized: {Assistive devices:23999} Level of assistance: {Levels of assistance:24026} Comments: ***  TREATMENT:                                                                                                                       [facility] Adult PT Treatment:                                                DATE: *** Eval and HEP Self Care: Additional minutes spent for educating on updated Therapeutic Home Exercise Program as well as comparing current status to condition at start of symptoms. This included exercises focusing on stretching, strengthening, with focus on eccentric aspects. Long term goals include an improvement in range of motion, strength, endurance as well as avoiding reinjury. Patient's frequency would include in 1-2 times a day, 3-5 times a week for a duration of 6-12 weeks. Proper technique shown and discussed handout in great detail. All questions were discussed and addressed.       PATIENT EDUCATION:  Education details: Discussed eval findings, rehab rationale and POC and patient is in agreement  Person educated: Patient Education method: Explanation and  Handouts Education comprehension: verbalized understanding and needs further education  HOME EXERCISE PROGRAM: ***  ASSESSMENT:  CLINICAL IMPRESSION: Patient is a *** y.o. *** who was seen today for physical therapy evaluation and treatment for ***.   OBJECTIVE IMPAIRMENTS: {opptimpairments:25111}.   ACTIVITY LIMITATIONS: {activitylimitations:27494}  PARTICIPATION LIMITATIONS: {participationrestrictions:25113}  PERSONAL FACTORS: {Personal factors:25162} are also affecting patient's functional outcome.   REHAB POTENTIAL: Fair based on chronicity, degenerative changes and body habitus  CLINICAL DECISION MAKING: Stable/uncomplicated  EVALUATION COMPLEXITY: Moderate   GOALS: Goals reviewed with patient? No  SHORT TERM GOALS: Target date: ***  Patient to demonstrate independence in HEP  Baseline: Goal status: INITIAL  2.  *** Baseline:  Goal status: INITIAL  3.  *** Baseline:  Goal status: INITIAL  4.  *** Baseline:  Goal status: INITIAL  5.  *** Baseline:  Goal status: INITIAL  6.  *** Baseline:  Goal status: INITIAL  LONG TERM GOALS: Target date: ***  Patient will acknowledge ***/10 pain at least once during episode of care   Baseline:  Goal status: INITIAL  2.  Patient will score at least ***% on FOTO to signify clinically meaningful improvement in  functional abilities.   Baseline:  Goal status: INITIAL  3.  Patient will increase 30s chair stand reps from *** to *** with/without arms to demonstrate and improved functional ability with less pain/difficulty as well as reduce fall risk.  Baseline:  Goal status: INITIAL  4.  *** Baseline:  Goal status: INITIAL  5.  *** Baseline:  Goal status: INITIAL  6.  *** Baseline:  Goal status: INITIAL  PLAN:  PT FREQUENCY: 1-2x/week  PT DURATION: 6 weeks  PLANNED INTERVENTIONS: 97110-Therapeutic exercises, 97530- Therapeutic activity, [id number]- Neuromuscular re-education, 97535- Self Care, [id number]-  Manual therapy, and Patient/Family education.  PLAN FOR NEXT SESSION: HEP review and update, manual techniques as appropriate, aerobic tasks, ROM and flexibility activities, strengthening and PREs, TPDN, gait and balance training,aquatic therapy, modalities for pain and NMRE      Basha Krygier M Laporche Martelle, PT 02/07/2024, 12:38 PM  "

## 2024-02-10 ENCOUNTER — Ambulatory Visit

## 2024-02-10 ENCOUNTER — Other Ambulatory Visit: Payer: Self-pay

## 2024-02-10 ENCOUNTER — Telehealth: Payer: Self-pay | Admitting: Cardiology

## 2024-02-10 DIAGNOSIS — M545 Low back pain, unspecified: Secondary | ICD-10-CM | POA: Diagnosis present

## 2024-02-10 DIAGNOSIS — Z7409 Other reduced mobility: Secondary | ICD-10-CM | POA: Insufficient documentation

## 2024-02-10 DIAGNOSIS — G8929 Other chronic pain: Secondary | ICD-10-CM | POA: Insufficient documentation

## 2024-02-10 DIAGNOSIS — M6281 Muscle weakness (generalized): Secondary | ICD-10-CM | POA: Insufficient documentation

## 2024-02-10 NOTE — Telephone Encounter (Signed)
 1/09 appointment has been cancelled by provider. Patient would like to know if the Lasix  can be discussed on the phone, or if she absolutely needs to reschedule for another in office visit.

## 2024-02-10 NOTE — Telephone Encounter (Signed)
 Patient had appt scheduled with APP on 02/17/24 to discuss Lasix  dosing. This appt was cancelled by provider.  Patient states she would rather not have to come in for an appt just to discuss the dose of her Lasix . She states she will just wait until her regular follow-up in June and will continue on Lasix  40 mg daily. She states if she has to go out she will skip a dose, or she will take 20 mg in the afternoon if she had been out.   Will forward to Dr. Shlomo to review.

## 2024-02-13 NOTE — Telephone Encounter (Signed)
 Spoke to patient she stated she wants to take Lasix  20 mg daily instead of 40 mg.Stated she feels like 40 mg is too much.She urinates too frequently.She is also having problems with cpap machine.Appointment scheduled with Dr.Turner 2/5 at 1:40 pm.I will make Dr.Turner aware.

## 2024-02-14 NOTE — Telephone Encounter (Signed)
 Called patient left Dr.Turner's advice on personal voice mail.

## 2024-02-15 ENCOUNTER — Other Ambulatory Visit: Payer: Self-pay | Admitting: Cardiology

## 2024-02-15 DIAGNOSIS — I4819 Other persistent atrial fibrillation: Secondary | ICD-10-CM

## 2024-02-17 ENCOUNTER — Ambulatory Visit

## 2024-02-17 ENCOUNTER — Encounter

## 2024-02-17 ENCOUNTER — Ambulatory Visit: Admitting: Physician Assistant

## 2024-02-17 DIAGNOSIS — I4819 Other persistent atrial fibrillation: Secondary | ICD-10-CM | POA: Diagnosis not present

## 2024-02-19 LAB — CUP PACEART REMOTE DEVICE CHECK
Date Time Interrogation Session: 20260109105711
Implantable Pulse Generator Implant Date: 20220912

## 2024-02-20 NOTE — Therapy (Unsigned)
 " OUTPATIENT PHYSICAL THERAPY THORACOLUMBAR EVALUATION   Patient Name: Karen Rasmussen MRN: [id number] DOB:[birth year], 79 y.o., female Today's Date: 02/20/2024  END OF SESSION:    Past Medical History:  Diagnosis Date   Abdominal pain, left lower quadrant    Asthmatic bronchitis    Benign essential HTN 04/26/2016   Bursitis of hip    CAD (coronary artery disease), native coronary artery 08/2023   cor Cal score of 3, <25% prox OM/mid LCx, 50% mid to distal LAD>>nonobstructive CAD by FFR.   Chronic diastolic CHF (congestive heart failure) (HCC)    DCM (dilated cardiomyopathy) (HCC)    EF 50-55% in Jan 2023   GERD (gastroesophageal reflux disease)    High cholesterol    History of kidney stones    Hypersomnia    Morbid obesity with BMI of 50.0-59.9, adult (HCC)    OSA on CPAP    Osteoarthritis    right hip; both knees (09/21/2016)   Persistent atrial fibrillation (HCC) 04/26/2016   Type II diabetes mellitus (HCC)    Vitamin D  deficiency disease    Past Surgical History:  Procedure Laterality Date   ABDOMINAL HYSTERECTOMY     APPENDECTOMY     BALLOON DILATION N/A 11/17/2017   Procedure: BALLOON DILATION;  Surgeon: Saintclair Jasper, MD;  Location: [facility];  Service: Gastroenterology;  Laterality: N/A;   BIOPSY  11/17/2017   Procedure: BIOPSY;  Surgeon: Saintclair Jasper, MD;  Location: [facility];  Service: Gastroenterology;;   BIOPSY  07/13/2022   Procedure: BIOPSY;  Surgeon: Saintclair Jasper, MD;  Location: [facility];  Service: Gastroenterology;;   BOTOX  INJECTION  07/13/2022   Procedure: BOTOX  INJECTION;  Surgeon: Saintclair Jasper, MD;  Location: [facility];  Service: Gastroenterology;;   CARDIAC CATHETERIZATION     CARDIOVERSION N/A 06/11/2016   Procedure: CARDIOVERSION;  Surgeon: Maranda Leim VEAR, MD;  Location: [facility] ENDOSCOPY;  Service: Cardiovascular;  Laterality: N/A;   CARDIOVERSION N/A 07/16/2016   Procedure: CARDIOVERSION;  Surgeon: Alveta Aleene PARAS, MD;  Location: [facility]  ENDOSCOPY;  Service: Cardiovascular;  Laterality: N/A;   CARDIOVERSION N/A 09/23/2016   Procedure: CARDIOVERSION;  Surgeon: Francyne Headland, MD;  Location: [facility] ENDOSCOPY;  Service: Cardiovascular;  Laterality: N/A;   CARDIOVERSION N/A 06/03/2020   Procedure: CARDIOVERSION;  Surgeon: Kate Lonni CROME, MD;  Location: [facility] ENDOSCOPY;  Service: Cardiovascular;  Laterality: N/A;   CHOLECYSTECTOMY OPEN     COLONOSCOPY N/A 11/17/2017   Procedure: COLONOSCOPY;  Surgeon: Saintclair Jasper, MD;  Location: [facility];  Service: Gastroenterology;  Laterality: N/A;   cortisone injection to right knee     costisone injection to left knee  11/03/2017   CYSTOSCOPY WITH RETROGRADE PYELOGRAM, URETEROSCOPY AND STENT PLACEMENT Left 01/14/2019   Procedure: CYSTOSCOPy  STENT PLACEMENT;  Surgeon: Elisabeth Valli BIRCH, MD;  Location: [facility];  Service: Urology;  Laterality: Left;   CYSTOSCOPY WITH RETROGRADE PYELOGRAM, URETEROSCOPY AND STENT PLACEMENT Left 03/06/2019   Procedure: CYSTOSCOPY WITH RETROGRADE PYELOGRAM, URETEROSCOPY AND STENT PLACEMENT;  Surgeon: Elisabeth Valli BIRCH, MD;  Location: [facility];  Service: Urology;  Laterality: Left;  90 MINS   CYSTOSCOPY WITH STENT PLACEMENT Left 03/07/2019   Procedure: CYSTOSCOPY WITH , URETEROSCOPY/ STENT PLACEMENT;  Surgeon: Cam Morene ORN, MD;  Location: [facility];  Service: Urology;  Laterality: Left;   DILATION AND CURETTAGE OF UTERUS     S/P miscarriage   ESOPHAGEAL MANOMETRY N/A 08/25/2022   Procedure: ESOPHAGEAL MANOMETRY (EM);  Surgeon: Dianna Specking, MD;  Location: [facility];  Service: Gastroenterology;  Laterality:  N/A;   ESOPHAGOGASTRODUODENOSCOPY N/A 11/17/2017   Procedure: ESOPHAGOGASTRODUODENOSCOPY (EGD);  Surgeon: Saintclair Jasper, MD;  Location: [facility];  Service: Gastroenterology;  Laterality: N/A;   ESOPHAGOGASTRODUODENOSCOPY (EGD) WITH PROPOFOL  N/A 07/13/2022   Procedure: ESOPHAGOGASTRODUODENOSCOPY (EGD) WITH PROPOFOL ;  Surgeon: Saintclair Jasper, MD;   Location: [facility];  Service: Gastroenterology;  Laterality: N/A;   HOLMIUM LASER APPLICATION Left 03/06/2019   Procedure: HOLMIUM LASER APPLICATION;  Surgeon: Elisabeth Valli BIRCH, MD;  Location: [facility];  Service: Urology;  Laterality: Left;   implantable loop recorder placement  10/20/2020   [employer] Reveal Linq model LNQ 11 (SN [id number]) implantable loop recorder implanted for Alleviate HF trial   NASAL SEPTUM SURGERY     POLYPECTOMY  11/17/2017   Procedure: POLYPECTOMY;  Surgeon: Saintclair Jasper, MD;  Location: [facility];  Service: Gastroenterology;;   POLYPECTOMY  07/13/2022   Procedure: POLYPECTOMY;  Surgeon: Saintclair Jasper, MD;  Location: [facility];  Service: Gastroenterology;;   TONSILLECTOMY     Patient Active Problem List   Diagnosis Date Noted   CAD (coronary artery disease), native coronary artery 08/2023   Hypercoagulable state due to persistent atrial fibrillation (HCC) 05/21/2020   Nephrolithiasis 03/07/2019   Acute pyelonephritis 01/17/2019   UTI due to extended-spectrum beta lactamase (ESBL) producing Escherichia coli    Left nephrolithiasis 01/14/2019   Hyperosmolar hyperglycemic state (HHS) (HCC) 01/13/2019   Acute renal injury    Urinary tract infection without hematuria    Elevated troponin    Person under investigation for COVID-19    Preoperative clearance 11/02/2017   Hypokalemia 09/23/2016   Encounter for monitoring dofetilide  therapy 09/21/2016   DCM (dilated cardiomyopathy) (HCC)    Benign essential HTN 04/26/2016   Persistent atrial fibrillation (HCC) 04/26/2016   Morbid obesity with BMI of 40.0-44.9, adult (HCC)    Chronic diastolic CHF (congestive heart failure) (HCC)    Abdominal pain, left lower quadrant    OSA (obstructive sleep apnea)    Benign hypertensive heart disease without heart failure    GERD (gastroesophageal reflux disease)    Hypersomnia    Depression    Asthmatic bronchitis    Bursitis of hip     PCP: Cleotilde Planas, MD    REFERRING PROVIDER: Basilia Cough, MD  REFERRING DIAG: [phone] (ICD-10-CM) - Other intervertebral disc degeneration, lumbar region with discogenic back pain and lower extremity pain M54.50 (ICD-10-CM) - Low back pain, unspecified  Rationale for Evaluation and Treatment: Rehabilitation  THERAPY DIAG:  No diagnosis found.  ONSET DATE: chronic  SUBJECTIVE:  SUBJECTIVE STATEMENT: Patient relates a years long history chronic low back pain which has caused her to reduce her activity levels and resort to use of RW and cane   PERTINENT HISTORY:     PAIN:  Are you having pain? Yes: NPRS scale: 10/10 Pain location: low back Pain description: ache, sore Aggravating factors: activity, standing, walking Relieving factors: rest, meds, bed rest   PRECAUTIONS: None  RED FLAGS: None   WEIGHT BEARING RESTRICTIONS: No  FALLS:  Has patient fallen in last 6 months? No  OCCUPATION: not working  PLOF: Independent  PATIENT GOALS: To manage my back pain  NEXT MD VISIT: TBD  OBJECTIVE:  Note: Objective measures were completed at Evaluation unless otherwise noted.  DIAGNOSTIC FINDINGS:  FINDINGS: Frontal view of the pelvis as well as frontal and frogleg lateral views of the right hip are obtained. No acute fracture, subluxation, or dislocation. There is moderate to severe bilateral hip osteoarthritis, right greater than left. Sacroiliac joints are normal. Prominent lower lumbar spondylosis and facet hypertrophy.  IMPRESSION: 1. Moderate to severe bilateral hip osteoarthritis, right greater than left. 2. No acute displaced fracture.   Electronically Signed By: Ozell Daring M.D. On: 12/31/2023 21:40   IMPRESSION: 1. 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 Subsequent L4-L5 mild spinal stenosis, moderate lateral recess stenosis and mild neural foraminal stenosis. Query L5 radiculitis. 2. Less pronounced lumbar spine degeneration otherwise, and no other significant stenosis   Electronically signed by: Helayne Hurst MD 01/15/2024 11:31 AM EST RP Workstation: [id number]  PATIENT SURVEYS:  Modified Oswestry: 34/50  Interpretation of scores: Score Category Description  0-20% Minimal Disability The patient can cope with most living activities. Usually no treatment is indicated apart from advice on lifting, sitting and exercise  21-40% Moderate Disability The patient experiences more pain and difficulty with sitting, lifting and standing. Travel and social life are more difficult and they may be disabled from work. Personal care, sexual activity and sleeping are not grossly affected, and the patient can usually be managed by conservative means  41-60% Severe Disability Pain remains the main problem in this group, but activities of daily living are affected. These patients require a detailed investigation  61-80% Crippled Back pain impinges on all aspects of the patients life. Positive intervention is required  81-100% Bed-bound These patients are either bed-bound or exaggerating their symptoms  Bluford FORBES Zoe DELENA Karon DELENA, et al. Surgery versus conservative management of stable thoracolumbar fracture: the PRESTO feasibility RCT. [place] ([place]): [employer]; 2021 Nov. [facility] Technology Assessment, No. 25.62.) Appendix 3, Oswestry Disability Index category descriptors. Available from: Findjewelers.cz  Minimally Clinically Important Difference (MCID) = 12.8%   MUSCLE LENGTH: Hamstrings: Right 90 deg; Left 90 deg (assessed in sitting) Thomas test: UTA due to body habitus  POSTURE:  UTA  PALPATION: deferred  LUMBAR ROM: deferred due to fall risk/instability in standing  AROM eval  Flexion   Extension   Right lateral flexion   Left lateral flexion   Right rotation   Left rotation    (Blank rows = not tested)  LOWER EXTREMITY ROM:   WFL for gait and transfers  Active  Right eval Left eval  Hip flexion    Hip extension    Hip abduction    Hip adduction    Hip internal rotation    Hip external rotation    Knee flexion  Knee extension    Ankle dorsiflexion    Ankle plantarflexion    Ankle inversion    Ankle eversion     (Blank rows = not tested)  LOWER EXTREMITY MMT:    MMT Right eval Left eval  Hip flexion    Hip extension    Hip abduction    Hip adduction    Hip internal rotation    Hip external rotation    Knee flexion    Knee extension    Ankle dorsiflexion    Ankle plantarflexion    Ankle inversion    Ankle eversion     (Blank rows = not tested)  LUMBAR SPECIAL TESTS:  Straight leg raise test: Negative and Slump test: Negative (assessed in sitting)  FUNCTIONAL TESTS:  30 seconds chair stand test 1  GAIT: Distance walked: required WC transport to room Assistive device utilized: Single point cane Level of assistance: assist with WC transport Comments: mobility limited by pain in low back  TREATMENT:                                                                                                                       [facility] Adult PT Treatment:                                                DATE: 02/10/24 Eval and HEP Self Care: Additional minutes spent for educating on updated Therapeutic Home Exercise Program as well as comparing current status to condition at start of symptoms. This included exercises focusing on stretching, strengthening, with focus on eccentric aspects. Long term goals include an improvement in range of motion, strength, endurance as well as avoiding reinjury. Patient's frequency would include in 1-2 times a  day, 3-5 times a week for a duration of 6-12 weeks. Proper technique shown and discussed handout in great detail. All questions were discussed and addressed.       PATIENT EDUCATION:  Education details: Discussed eval findings, rehab rationale and POC and patient is in agreement  Person educated: Patient Education method: Explanation and Handouts Education comprehension: verbalized understanding and needs further education  HOME EXERCISE PROGRAM: Access Code: [email or web address] URL: [email or web address] Date: 02/10/2024 Prepared by: Reyes Kohut  Exercises - Seated Long Arc Quad  - 2 x daily - 5 x weekly - 3 sets - 10 reps - Seated Heel Toe Raises  - 2 x daily - 5 x weekly - 3 sets - 10 reps - Seated Heel Raise  - 2 x daily - 5 x weekly - 3 sets - 10 reps  ASSESSMENT:  CLINICAL IMPRESSION: Patient is a 79 y.o. female who was seen today for physical therapy evaluation and treatment for chronic low back pain due to underlying degenerative changes.   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  Patient able to perform single STS w/o UE assist but limited to single rep due to fall risk/unsteadiness.  Scope of assessment limited by body habitus, pain and intolerance of testing positions as well as concurrent pathologies involving knees, hips, respiratory and cardiopulmonary symptoms.  OBJECTIVE IMPAIRMENTS: cardiopulmonary status limiting activity, decreased activity tolerance, decreased balance, decreased endurance, decreased knowledge of condition, decreased mobility, difficulty walking, decreased ROM, decreased strength, decreased safety awareness, improper body mechanics, postural dysfunction, obesity, and pain.   ACTIVITY LIMITATIONS: carrying, lifting, bending, sitting, standing, squatting, sleeping, stairs, bed mobility, toileting, and locomotion  level  PERSONAL FACTORS: Fitness, Past/current experiences, and Time since onset of injury/illness/exacerbation are also affecting patient's functional outcome.   REHAB POTENTIAL: Fair based on chronicity, degenerative changes and body habitus  CLINICAL DECISION MAKING: Stable/uncomplicated  EVALUATION COMPLEXITY: Moderate   GOALS: Goals reviewed with patient? No  SHORT TERM GOALS: Target date: 03/09/2024    Patient to demonstrate independence in HEP  Baseline: [email or web address] Goal status: INITIAL  2.  Increase STS to 2 reps w/o UE assist Baseline: 1 Goal status: INITIAL   LONG TERM GOALS: Target date: 04/06/2024    Patient will acknowledge 6/10 pain at least once during episode of care   Baseline: 10/10 Goal status: INITIAL  2.  Patient will score at least 22/50 on ODI to signify clinically meaningful improvement in functional abilities.   Baseline: 34/50 Goal status: INITIAL  3.  Patient will increase 30s chair stand reps from 1 to 4 without arms to demonstrate and improved functional ability with less pain/difficulty as well as reduce fall risk.  Baseline: 1 Goal status: INITIAL  4.  239ft ambulation with LRAD Baseline: required WC transport. Goal status: INITIAL    PLAN:  PT FREQUENCY: 1-2x/week  PT DURATION: 6 weeks  PLANNED INTERVENTIONS: 97110-Therapeutic exercises, 97530- Therapeutic activity, [id number]- Neuromuscular re-education, 97535- Self Care, [id number]- Manual therapy, and Patient/Family education.  PLAN FOR NEXT SESSION: HEP review and update, manual techniques as appropriate, aerobic tasks, ROM and flexibility activities, strengthening and PREs, TPDN, gait and balance training,aquatic therapy, modalities for pain and NMRE      Jillana Selph M Wendel Homeyer, PT 02/20/2024, 10:36 AM  "

## 2024-02-20 NOTE — Progress Notes (Signed)
 Remote Loop Recorder Transmission

## 2024-02-21 ENCOUNTER — Ambulatory Visit

## 2024-02-21 NOTE — Telephone Encounter (Signed)
 Call to patient to make sure she understands ok to decrease lasix  to 20 mg daily and also ok to take 20 mg extra daily for leg swelling. Patient states she did get these instructions over MyChart and verbalizes understanding.

## 2024-02-23 ENCOUNTER — Ambulatory Visit

## 2024-02-23 ENCOUNTER — Telehealth: Payer: Self-pay

## 2024-02-23 NOTE — Telephone Encounter (Signed)
 Pt called in wanting to know since the alleviate trial ended how are we going to monitor her fluid levels. Spoke with [facility] and she states that the report that we get doesn't have those diagnostics on it to show that. What is the best choice moving forward with this pt

## 2024-02-24 ENCOUNTER — Ambulatory Visit: Payer: Self-pay | Admitting: Cardiovascular Disease

## 2024-02-27 ENCOUNTER — Other Ambulatory Visit: Payer: Self-pay | Admitting: Cardiology

## 2024-02-27 NOTE — Therapy (Unsigned)
 " OUTPATIENT PHYSICAL THERAPY THORACOLUMBAR EVALUATION   Patient Name: Karen Rasmussen MRN: [id number] DOB:24-Jul-1945, 79 y.o., female Today's Date: 02/27/2024  END OF SESSION:    Past Medical History:  Diagnosis Date   Abdominal pain, left lower quadrant    Asthmatic bronchitis    Benign essential HTN 04/26/2016   Bursitis of hip    CAD (coronary artery disease), native coronary artery 08/2023   cor Cal score of 3, <25% prox OM/mid LCx, 50% mid to distal LAD>>nonobstructive CAD by FFR.   Chronic diastolic CHF (congestive heart failure) (HCC)    DCM (dilated cardiomyopathy) (HCC)    EF 50-55% in Jan 2023   GERD (gastroesophageal reflux disease)    High cholesterol    History of kidney stones    Hypersomnia    Morbid obesity with BMI of 50.0-59.9, adult (HCC)    OSA on CPAP    Osteoarthritis    right hip; both knees (09/21/2016)   Persistent atrial fibrillation (HCC) 04/26/2016   Type II diabetes mellitus (HCC)    Vitamin D  deficiency disease    Past Surgical History:  Procedure Laterality Date   ABDOMINAL HYSTERECTOMY     APPENDECTOMY     BALLOON DILATION N/A 11/17/2017   Procedure: BALLOON DILATION;  Surgeon: Saintclair Jasper, MD;  Location: [facility];  Service: Gastroenterology;  Laterality: N/A;   BIOPSY  11/17/2017   Procedure: BIOPSY;  Surgeon: Saintclair Jasper, MD;  Location: [facility];  Service: Gastroenterology;;   BIOPSY  07/13/2022   Procedure: BIOPSY;  Surgeon: Saintclair Jasper, MD;  Location: [facility];  Service: Gastroenterology;;   BOTOX  INJECTION  07/13/2022   Procedure: BOTOX  INJECTION;  Surgeon: Saintclair Jasper, MD;  Location: [facility];  Service: Gastroenterology;;   CARDIAC CATHETERIZATION     CARDIOVERSION N/A 06/11/2016   Procedure: CARDIOVERSION;  Surgeon: Maranda Leim VEAR, MD;  Location: [facility];  Service: Cardiovascular;  Laterality: N/A;   CARDIOVERSION N/A 07/16/2016   Procedure: CARDIOVERSION;  Surgeon: Alveta Aleene PARAS, MD;  Location: [facility]  ENDOSCOPY;  Service: Cardiovascular;  Laterality: N/A;   CARDIOVERSION N/A 09/23/2016   Procedure: CARDIOVERSION;  Surgeon: Francyne Headland, MD;  Location: [facility];  Service: Cardiovascular;  Laterality: N/A;   CARDIOVERSION N/A 06/03/2020   Procedure: CARDIOVERSION;  Surgeon: Kate Lonni CROME, MD;  Location: [facility] ENDOSCOPY;  Service: Cardiovascular;  Laterality: N/A;   CHOLECYSTECTOMY OPEN     COLONOSCOPY N/A 11/17/2017   Procedure: COLONOSCOPY;  Surgeon: Saintclair Jasper, MD;  Location: [facility];  Service: Gastroenterology;  Laterality: N/A;   cortisone injection to right knee     costisone injection to left knee  11/03/2017   CYSTOSCOPY WITH RETROGRADE PYELOGRAM, URETEROSCOPY AND STENT PLACEMENT Left 01/14/2019   Procedure: CYSTOSCOPy  STENT PLACEMENT;  Surgeon: Elisabeth Valli BIRCH, MD;  Location: [facility];  Service: Urology;  Laterality: Left;   CYSTOSCOPY WITH RETROGRADE PYELOGRAM, URETEROSCOPY AND STENT PLACEMENT Left 03/06/2019   Procedure: CYSTOSCOPY WITH RETROGRADE PYELOGRAM, URETEROSCOPY AND STENT PLACEMENT;  Surgeon: Elisabeth Valli BIRCH, MD;  Location: [facility];  Service: Urology;  Laterality: Left;  90 MINS   CYSTOSCOPY WITH STENT PLACEMENT Left 03/07/2019   Procedure: CYSTOSCOPY WITH , URETEROSCOPY/ STENT PLACEMENT;  Surgeon: Cam Morene ORN, MD;  Location: [facility];  Service: Urology;  Laterality: Left;   DILATION AND CURETTAGE OF UTERUS     S/P miscarriage   ESOPHAGEAL MANOMETRY N/A 08/25/2022   Procedure: ESOPHAGEAL MANOMETRY (EM);  Surgeon: Dianna Specking, MD;  Location: [facility];  Service: Gastroenterology;  Laterality:  N/A;   ESOPHAGOGASTRODUODENOSCOPY N/A 11/17/2017   Procedure: ESOPHAGOGASTRODUODENOSCOPY (EGD);  Surgeon: Saintclair Jasper, MD;  Location: [facility];  Service: Gastroenterology;  Laterality: N/A;   ESOPHAGOGASTRODUODENOSCOPY (EGD) WITH PROPOFOL  N/A 07/13/2022   Procedure: ESOPHAGOGASTRODUODENOSCOPY (EGD) WITH PROPOFOL ;  Surgeon: Saintclair Jasper, MD;   Location: [facility];  Service: Gastroenterology;  Laterality: N/A;   HOLMIUM LASER APPLICATION Left 03/06/2019   Procedure: HOLMIUM LASER APPLICATION;  Surgeon: Elisabeth Valli BIRCH, MD;  Location: [facility];  Service: Urology;  Laterality: Left;   implantable loop recorder placement  10/20/2020   [employer] Reveal Linq model LNQ 11 (SN [id number]) implantable loop recorder implanted for Alleviate HF trial   NASAL SEPTUM SURGERY     POLYPECTOMY  11/17/2017   Procedure: POLYPECTOMY;  Surgeon: Saintclair Jasper, MD;  Location: [facility];  Service: Gastroenterology;;   POLYPECTOMY  07/13/2022   Procedure: POLYPECTOMY;  Surgeon: Saintclair Jasper, MD;  Location: [facility];  Service: Gastroenterology;;   TONSILLECTOMY     Patient Active Problem List   Diagnosis Date Noted   CAD (coronary artery disease), native coronary artery 08/2023   Hypercoagulable state due to persistent atrial fibrillation (HCC) 05/21/2020   Nephrolithiasis 03/07/2019   Acute pyelonephritis 01/17/2019   UTI due to extended-spectrum beta lactamase (ESBL) producing Escherichia coli    Left nephrolithiasis 01/14/2019   Hyperosmolar hyperglycemic state (HHS) (HCC) 01/13/2019   Acute renal injury    Urinary tract infection without hematuria    Elevated troponin    Person under investigation for COVID-19    Preoperative clearance 11/02/2017   Hypokalemia 09/23/2016   Encounter for monitoring dofetilide  therapy 09/21/2016   DCM (dilated cardiomyopathy) (HCC)    Benign essential HTN 04/26/2016   Persistent atrial fibrillation (HCC) 04/26/2016   Morbid obesity with BMI of 40.0-44.9, adult (HCC)    Chronic diastolic CHF (congestive heart failure) (HCC)    Abdominal pain, left lower quadrant    OSA (obstructive sleep apnea)    Benign hypertensive heart disease without heart failure    GERD (gastroesophageal reflux disease)    Hypersomnia    Depression    Asthmatic bronchitis    Bursitis of hip     PCP: Cleotilde Planas, MD    REFERRING PROVIDER: Basilia Cough, MD  REFERRING DIAG: [phone] (ICD-10-CM) - Other intervertebral disc degeneration, lumbar region with discogenic back pain and lower extremity pain M54.50 (ICD-10-CM) - Low back pain, unspecified  Rationale for Evaluation and Treatment: Rehabilitation  THERAPY DIAG:  No diagnosis found.  ONSET DATE: chronic  SUBJECTIVE:  SUBJECTIVE STATEMENT: Patient relates a years long history chronic low back pain which has caused her to reduce her activity levels and resort to use of RW and cane   PERTINENT HISTORY:     PAIN:  Are you having pain? Yes: NPRS scale: 10/10 Pain location: low back Pain description: ache, sore Aggravating factors: activity, standing, walking Relieving factors: rest, meds, bed rest   PRECAUTIONS: None  RED FLAGS: None   WEIGHT BEARING RESTRICTIONS: No  FALLS:  Has patient fallen in last 6 months? No  OCCUPATION: not working  PLOF: Independent  PATIENT GOALS: To manage my back pain  NEXT MD VISIT: TBD  OBJECTIVE:  Note: Objective measures were completed at Evaluation unless otherwise noted.  DIAGNOSTIC FINDINGS:  FINDINGS: Frontal view of the pelvis as well as frontal and frogleg lateral views of the right hip are obtained. No acute fracture, subluxation, or dislocation. There is moderate to severe bilateral hip osteoarthritis, right greater than left. Sacroiliac joints are normal. Prominent lower lumbar spondylosis and facet hypertrophy.  IMPRESSION: 1. Moderate to severe bilateral hip osteoarthritis, right greater than left. 2. No acute displaced fracture.   Electronically Signed By: Ozell Daring M.D. On: 12/31/2023 21:40   IMPRESSION: 1. 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 Subsequent L4-L5 mild spinal stenosis, moderate lateral recess stenosis and mild neural foraminal stenosis. Query L5 radiculitis. 2. Less pronounced lumbar spine degeneration otherwise, and no other significant stenosis   Electronically signed by: Helayne Hurst MD 01/15/2024 11:31 AM EST RP Workstation: [id number]  PATIENT SURVEYS:  Modified Oswestry: 34/50  Interpretation of scores: Score Category Description  0-20% Minimal Disability The patient can cope with most living activities. Usually no treatment is indicated apart from advice on lifting, sitting and exercise  21-40% Moderate Disability The patient experiences more pain and difficulty with sitting, lifting and standing. Travel and social life are more difficult and they may be disabled from work. Personal care, sexual activity and sleeping are not grossly affected, and the patient can usually be managed by conservative means  41-60% Severe Disability Pain remains the main problem in this group, but activities of daily living are affected. These patients require a detailed investigation  61-80% Crippled Back pain impinges on all aspects of the patients life. Positive intervention is required  81-100% Bed-bound These patients are either bed-bound or exaggerating their symptoms  Bluford FORBES Zoe DELENA Karon DELENA, et al. Surgery versus conservative management of stable thoracolumbar fracture: the PRESTO feasibility RCT. [place] ([place]): [employer]; 2021 Nov. [facility] Technology Assessment, No. 25.62.) Appendix 3, Oswestry Disability Index category descriptors. Available from: Findjewelers.cz  Minimally Clinically Important Difference (MCID) = 12.8%   MUSCLE LENGTH: Hamstrings: Right 90 deg; Left 90 deg (assessed in sitting) Thomas test: UTA due to body habitus  POSTURE:  UTA  PALPATION: deferred  LUMBAR ROM: deferred due to fall risk/instability in standing  AROM eval  Flexion   Extension   Right lateral flexion   Left lateral flexion   Right rotation   Left rotation    (Blank rows = not tested)  LOWER EXTREMITY ROM:   WFL for gait and transfers  Active  Right eval Left eval  Hip flexion    Hip extension    Hip abduction    Hip adduction    Hip internal rotation    Hip external rotation    Knee flexion  Knee extension    Ankle dorsiflexion    Ankle plantarflexion    Ankle inversion    Ankle eversion     (Blank rows = not tested)  LOWER EXTREMITY MMT:    MMT Right eval Left eval  Hip flexion    Hip extension    Hip abduction    Hip adduction    Hip internal rotation    Hip external rotation    Knee flexion    Knee extension    Ankle dorsiflexion    Ankle plantarflexion    Ankle inversion    Ankle eversion     (Blank rows = not tested)  LUMBAR SPECIAL TESTS:  Straight leg raise test: Negative and Slump test: Negative (assessed in sitting)  FUNCTIONAL TESTS:  30 seconds chair stand test 1  GAIT: Distance walked: required WC transport to room Assistive device utilized: Single point cane Level of assistance: assist with WC transport Comments: mobility limited by pain in low back  TREATMENT:                                                                                                                       [facility] Adult PT Treatment:                                                DATE: 02/10/24 Eval and HEP Self Care: Additional minutes spent for educating on updated Therapeutic Home Exercise Program as well as comparing current status to condition at start of symptoms. This included exercises focusing on stretching, strengthening, with focus on eccentric aspects. Long term goals include an improvement in range of motion, strength, endurance as well as avoiding reinjury. Patient's frequency would include in 1-2 times a  day, 3-5 times a week for a duration of 6-12 weeks. Proper technique shown and discussed handout in great detail. All questions were discussed and addressed.       PATIENT EDUCATION:  Education details: Discussed eval findings, rehab rationale and POC and patient is in agreement  Person educated: Patient Education method: Explanation and Handouts Education comprehension: verbalized understanding and needs further education  HOME EXERCISE PROGRAM: Access Code: [email or web address] URL: [email or web address] Date: 02/10/2024 Prepared by: Reyes Kohut  Exercises - Seated Long Arc Quad  - 2 x daily - 5 x weekly - 3 sets - 10 reps - Seated Heel Toe Raises  - 2 x daily - 5 x weekly - 3 sets - 10 reps - Seated Heel Raise  - 2 x daily - 5 x weekly - 3 sets - 10 reps  ASSESSMENT:  CLINICAL IMPRESSION: Patient is a 79 y.o. female who was seen today for physical therapy evaluation and treatment for chronic low back pain due to underlying degenerative changes.   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  Patient able to perform single STS w/o UE assist but limited to single rep due to fall risk/unsteadiness.  Scope of assessment limited by body habitus, pain and intolerance of testing positions as well as concurrent pathologies involving knees, hips, respiratory and cardiopulmonary symptoms.  OBJECTIVE IMPAIRMENTS: cardiopulmonary status limiting activity, decreased activity tolerance, decreased balance, decreased endurance, decreased knowledge of condition, decreased mobility, difficulty walking, decreased ROM, decreased strength, decreased safety awareness, improper body mechanics, postural dysfunction, obesity, and pain.   ACTIVITY LIMITATIONS: carrying, lifting, bending, sitting, standing, squatting, sleeping, stairs, bed mobility, toileting, and locomotion  level  PERSONAL FACTORS: Fitness, Past/current experiences, and Time since onset of injury/illness/exacerbation are also affecting patient's functional outcome.   REHAB POTENTIAL: Fair based on chronicity, degenerative changes and body habitus  CLINICAL DECISION MAKING: Stable/uncomplicated  EVALUATION COMPLEXITY: Moderate   GOALS: Goals reviewed with patient? No  SHORT TERM GOALS: Target date: 03/09/2024    Patient to demonstrate independence in HEP  Baseline: [email or web address] Goal status: INITIAL  2.  Increase STS to 2 reps w/o UE assist Baseline: 1 Goal status: INITIAL   LONG TERM GOALS: Target date: 04/06/2024    Patient will acknowledge 6/10 pain at least once during episode of care   Baseline: 10/10 Goal status: INITIAL  2.  Patient will score at least 22/50 on ODI to signify clinically meaningful improvement in functional abilities.   Baseline: 34/50 Goal status: INITIAL  3.  Patient will increase 30s chair stand reps from 1 to 4 without arms to demonstrate and improved functional ability with less pain/difficulty as well as reduce fall risk.  Baseline: 1 Goal status: INITIAL  4.  2101ft ambulation with LRAD Baseline: required WC transport. Goal status: INITIAL    PLAN:  PT FREQUENCY: 1-2x/week  PT DURATION: 6 weeks  PLANNED INTERVENTIONS: 97110-Therapeutic exercises, 97530- Therapeutic activity, [id number]- Neuromuscular re-education, 97535- Self Care, [id number]- Manual therapy, and Patient/Family education.  PLAN FOR NEXT SESSION: HEP review and update, manual techniques as appropriate, aerobic tasks, ROM and flexibility activities, strengthening and PREs, TPDN, gait and balance training,aquatic therapy, modalities for pain and NMRE      Jahon Bart M Hassen Bruun, PT 02/27/2024, 9:51 AM  "

## 2024-02-28 ENCOUNTER — Ambulatory Visit

## 2024-03-01 ENCOUNTER — Ambulatory Visit

## 2024-03-01 NOTE — Therapy (Unsigned)
 " OUTPATIENT PHYSICAL THERAPY THORACOLUMBAR EVALUATION   Patient Name: Karen Rasmussen MRN: [id number] DOB:1946/01/06, 79 y.o., female Today's Date: 03/01/2024  END OF SESSION:    Past Medical History:  Diagnosis Date   Abdominal pain, left lower quadrant    Asthmatic bronchitis    Benign essential HTN 04/26/2016   Bursitis of hip    CAD (coronary artery disease), native coronary artery 08/2023   cor Cal score of 3, <25% prox OM/mid LCx, 50% mid to distal LAD>>nonobstructive CAD by FFR.   Chronic diastolic CHF (congestive heart failure) (HCC)    DCM (dilated cardiomyopathy) (HCC)    EF 50-55% in Jan 2023   GERD (gastroesophageal reflux disease)    High cholesterol    History of kidney stones    Hypersomnia    Morbid obesity with BMI of 50.0-59.9, adult (HCC)    OSA on CPAP    Osteoarthritis    right hip; both knees (09/21/2016)   Persistent atrial fibrillation (HCC) 04/26/2016   Type II diabetes mellitus (HCC)    Vitamin D  deficiency disease    Past Surgical History:  Procedure Laterality Date   ABDOMINAL HYSTERECTOMY     APPENDECTOMY     BALLOON DILATION N/A 11/17/2017   Procedure: BALLOON DILATION;  Surgeon: Saintclair Jasper, MD;  Location: [facility];  Service: Gastroenterology;  Laterality: N/A;   BIOPSY  11/17/2017   Procedure: BIOPSY;  Surgeon: Saintclair Jasper, MD;  Location: [facility];  Service: Gastroenterology;;   BIOPSY  07/13/2022   Procedure: BIOPSY;  Surgeon: Saintclair Jasper, MD;  Location: [facility];  Service: Gastroenterology;;   BOTOX  INJECTION  07/13/2022   Procedure: BOTOX  INJECTION;  Surgeon: Saintclair Jasper, MD;  Location: [facility];  Service: Gastroenterology;;   CARDIAC CATHETERIZATION     CARDIOVERSION N/A 06/11/2016   Procedure: CARDIOVERSION;  Surgeon: Maranda Leim VEAR, MD;  Location: [facility] ENDOSCOPY;  Service: Cardiovascular;  Laterality: N/A;   CARDIOVERSION N/A 07/16/2016   Procedure: CARDIOVERSION;  Surgeon: Alveta Aleene PARAS, MD;  Location: [facility]  ENDOSCOPY;  Service: Cardiovascular;  Laterality: N/A;   CARDIOVERSION N/A 09/23/2016   Procedure: CARDIOVERSION;  Surgeon: Francyne Headland, MD;  Location: [facility];  Service: Cardiovascular;  Laterality: N/A;   CARDIOVERSION N/A 06/03/2020   Procedure: CARDIOVERSION;  Surgeon: Kate Lonni CROME, MD;  Location: [facility] ENDOSCOPY;  Service: Cardiovascular;  Laterality: N/A;   CHOLECYSTECTOMY OPEN     COLONOSCOPY N/A 11/17/2017   Procedure: COLONOSCOPY;  Surgeon: Saintclair Jasper, MD;  Location: [facility];  Service: Gastroenterology;  Laterality: N/A;   cortisone injection to right knee     costisone injection to left knee  11/03/2017   CYSTOSCOPY WITH RETROGRADE PYELOGRAM, URETEROSCOPY AND STENT PLACEMENT Left 01/14/2019   Procedure: CYSTOSCOPy  STENT PLACEMENT;  Surgeon: Elisabeth Valli BIRCH, MD;  Location: [facility];  Service: Urology;  Laterality: Left;   CYSTOSCOPY WITH RETROGRADE PYELOGRAM, URETEROSCOPY AND STENT PLACEMENT Left 03/06/2019   Procedure: CYSTOSCOPY WITH RETROGRADE PYELOGRAM, URETEROSCOPY AND STENT PLACEMENT;  Surgeon: Elisabeth Valli BIRCH, MD;  Location: [facility];  Service: Urology;  Laterality: Left;  90 MINS   CYSTOSCOPY WITH STENT PLACEMENT Left 03/07/2019   Procedure: CYSTOSCOPY WITH , URETEROSCOPY/ STENT PLACEMENT;  Surgeon: Cam Morene ORN, MD;  Location: [facility];  Service: Urology;  Laterality: Left;   DILATION AND CURETTAGE OF UTERUS     S/P miscarriage   ESOPHAGEAL MANOMETRY N/A 08/25/2022   Procedure: ESOPHAGEAL MANOMETRY (EM);  Surgeon: Dianna Specking, MD;  Location: [facility];  Service: Gastroenterology;  Laterality:  N/A;   ESOPHAGOGASTRODUODENOSCOPY N/A 11/17/2017   Procedure: ESOPHAGOGASTRODUODENOSCOPY (EGD);  Surgeon: Saintclair Jasper, MD;  Location: [facility];  Service: Gastroenterology;  Laterality: N/A;   ESOPHAGOGASTRODUODENOSCOPY (EGD) WITH PROPOFOL  N/A 07/13/2022   Procedure: ESOPHAGOGASTRODUODENOSCOPY (EGD) WITH PROPOFOL ;  Surgeon: Saintclair Jasper, MD;   Location: [facility];  Service: Gastroenterology;  Laterality: N/A;   HOLMIUM LASER APPLICATION Left 03/06/2019   Procedure: HOLMIUM LASER APPLICATION;  Surgeon: Elisabeth Valli BIRCH, MD;  Location: [facility];  Service: Urology;  Laterality: Left;   implantable loop recorder placement  10/20/2020   [employer] Reveal Linq model LNQ 11 (SN [id number]) implantable loop recorder implanted for Alleviate HF trial   NASAL SEPTUM SURGERY     POLYPECTOMY  11/17/2017   Procedure: POLYPECTOMY;  Surgeon: Saintclair Jasper, MD;  Location: [facility];  Service: Gastroenterology;;   POLYPECTOMY  07/13/2022   Procedure: POLYPECTOMY;  Surgeon: Saintclair Jasper, MD;  Location: [facility];  Service: Gastroenterology;;   TONSILLECTOMY     Patient Active Problem List   Diagnosis Date Noted   CAD (coronary artery disease), native coronary artery 08/2023   Hypercoagulable state due to persistent atrial fibrillation (HCC) 05/21/2020   Nephrolithiasis 03/07/2019   Acute pyelonephritis 01/17/2019   UTI due to extended-spectrum beta lactamase (ESBL) producing Escherichia coli    Left nephrolithiasis 01/14/2019   Hyperosmolar hyperglycemic state (HHS) (HCC) 01/13/2019   Acute renal injury    Urinary tract infection without hematuria    Elevated troponin    Person under investigation for COVID-19    Preoperative clearance 11/02/2017   Hypokalemia 09/23/2016   Encounter for monitoring dofetilide  therapy 09/21/2016   DCM (dilated cardiomyopathy) (HCC)    Benign essential HTN 04/26/2016   Persistent atrial fibrillation (HCC) 04/26/2016   Morbid obesity with BMI of 40.0-44.9, adult (HCC)    Chronic diastolic CHF (congestive heart failure) (HCC)    Abdominal pain, left lower quadrant    OSA (obstructive sleep apnea)    Benign hypertensive heart disease without heart failure    GERD (gastroesophageal reflux disease)    Hypersomnia    Depression    Asthmatic bronchitis    Bursitis of hip     PCP: Cleotilde Planas, MD    REFERRING PROVIDER: Basilia Cough, MD  REFERRING DIAG: [phone] (ICD-10-CM) - Other intervertebral disc degeneration, lumbar region with discogenic back pain and lower extremity pain M54.50 (ICD-10-CM) - Low back pain, unspecified  Rationale for Evaluation and Treatment: Rehabilitation  THERAPY DIAG:  No diagnosis found.  ONSET DATE: chronic  SUBJECTIVE:  SUBJECTIVE STATEMENT: Patient relates a years long history chronic low back pain which has caused her to reduce her activity levels and resort to use of RW and cane   PERTINENT HISTORY:     PAIN:  Are you having pain? Yes: NPRS scale: 10/10 Pain location: low back Pain description: ache, sore Aggravating factors: activity, standing, walking Relieving factors: rest, meds, bed rest   PRECAUTIONS: None  RED FLAGS: None   WEIGHT BEARING RESTRICTIONS: No  FALLS:  Has patient fallen in last 6 months? No  OCCUPATION: not working  PLOF: Independent  PATIENT GOALS: To manage my back pain  NEXT MD VISIT: TBD  OBJECTIVE:  Note: Objective measures were completed at Evaluation unless otherwise noted.  DIAGNOSTIC FINDINGS:  FINDINGS: Frontal view of the pelvis as well as frontal and frogleg lateral views of the right hip are obtained. No acute fracture, subluxation, or dislocation. There is moderate to severe bilateral hip osteoarthritis, right greater than left. Sacroiliac joints are normal. Prominent lower lumbar spondylosis and facet hypertrophy.  IMPRESSION: 1. Moderate to severe bilateral hip osteoarthritis, right greater than left. 2. No acute displaced fracture.   Electronically Signed By: Ozell Daring M.D. On: 12/31/2023 21:40   IMPRESSION: 1. 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 Subsequent L4-L5 mild spinal stenosis, moderate lateral recess stenosis and mild neural foraminal stenosis. Query L5 radiculitis. 2. Less pronounced lumbar spine degeneration otherwise, and no other significant stenosis   Electronically signed by: Helayne Hurst MD 01/15/2024 11:31 AM EST RP Workstation: [id number]  PATIENT SURVEYS:  Modified Oswestry: 34/50  Interpretation of scores: Score Category Description  0-20% Minimal Disability The patient can cope with most living activities. Usually no treatment is indicated apart from advice on lifting, sitting and exercise  21-40% Moderate Disability The patient experiences more pain and difficulty with sitting, lifting and standing. Travel and social life are more difficult and they may be disabled from work. Personal care, sexual activity and sleeping are not grossly affected, and the patient can usually be managed by conservative means  41-60% Severe Disability Pain remains the main problem in this group, but activities of daily living are affected. These patients require a detailed investigation  61-80% Crippled Back pain impinges on all aspects of the patients life. Positive intervention is required  81-100% Bed-bound These patients are either bed-bound or exaggerating their symptoms  Bluford FORBES Zoe DELENA Karon DELENA, et al. Surgery versus conservative management of stable thoracolumbar fracture: the PRESTO feasibility RCT. [place] ([place]): [employer]; 2021 Nov. [facility] Technology Assessment, No. 25.62.) Appendix 3, Oswestry Disability Index category descriptors. Available from: Findjewelers.cz  Minimally Clinically Important Difference (MCID) = 12.8%   MUSCLE LENGTH: Hamstrings: Right 90 deg; Left 90 deg (assessed in sitting) Thomas test: UTA due to body habitus  POSTURE:  UTA  PALPATION: deferred  LUMBAR ROM: deferred due to fall risk/instability in standing  AROM eval  Flexion   Extension   Right lateral flexion   Left lateral flexion   Right rotation   Left rotation    (Blank rows = not tested)  LOWER EXTREMITY ROM:   WFL for gait and transfers  Active  Right eval Left eval  Hip flexion    Hip extension    Hip abduction    Hip adduction    Hip internal rotation    Hip external rotation    Knee flexion  Knee extension    Ankle dorsiflexion    Ankle plantarflexion    Ankle inversion    Ankle eversion     (Blank rows = not tested)  LOWER EXTREMITY MMT:    MMT Right eval Left eval  Hip flexion    Hip extension    Hip abduction    Hip adduction    Hip internal rotation    Hip external rotation    Knee flexion    Knee extension    Ankle dorsiflexion    Ankle plantarflexion    Ankle inversion    Ankle eversion     (Blank rows = not tested)  LUMBAR SPECIAL TESTS:  Straight leg raise test: Negative and Slump test: Negative (assessed in sitting)  FUNCTIONAL TESTS:  30 seconds chair stand test 1  GAIT: Distance walked: required WC transport to room Assistive device utilized: Single point cane Level of assistance: assist with WC transport Comments: mobility limited by pain in low back  TREATMENT:                                                                                                                       [facility] Adult PT Treatment:                                                DATE: 02/10/24 Eval and HEP Self Care: Additional minutes spent for educating on updated Therapeutic Home Exercise Program as well as comparing current status to condition at start of symptoms. This included exercises focusing on stretching, strengthening, with focus on eccentric aspects. Long term goals include an improvement in range of motion, strength, endurance as well as avoiding reinjury. Patient's frequency would include in 1-2 times a  day, 3-5 times a week for a duration of 6-12 weeks. Proper technique shown and discussed handout in great detail. All questions were discussed and addressed.       PATIENT EDUCATION:  Education details: Discussed eval findings, rehab rationale and POC and patient is in agreement  Person educated: Patient Education method: Explanation and Handouts Education comprehension: verbalized understanding and needs further education  HOME EXERCISE PROGRAM: Access Code: [email or web address] URL: [email or web address] Date: 02/10/2024 Prepared by: Reyes Kohut  Exercises - Seated Long Arc Quad  - 2 x daily - 5 x weekly - 3 sets - 10 reps - Seated Heel Toe Raises  - 2 x daily - 5 x weekly - 3 sets - 10 reps - Seated Heel Raise  - 2 x daily - 5 x weekly - 3 sets - 10 reps  ASSESSMENT:  CLINICAL IMPRESSION: Patient is a 79 y.o. female who was seen today for physical therapy evaluation and treatment for chronic low back pain due to underlying degenerative changes.   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  Patient able to perform single STS w/o UE assist but limited to single rep due to fall risk/unsteadiness.  Scope of assessment limited by body habitus, pain and intolerance of testing positions as well as concurrent pathologies involving knees, hips, respiratory and cardiopulmonary symptoms.  OBJECTIVE IMPAIRMENTS: cardiopulmonary status limiting activity, decreased activity tolerance, decreased balance, decreased endurance, decreased knowledge of condition, decreased mobility, difficulty walking, decreased ROM, decreased strength, decreased safety awareness, improper body mechanics, postural dysfunction, obesity, and pain.   ACTIVITY LIMITATIONS: carrying, lifting, bending, sitting, standing, squatting, sleeping, stairs, bed mobility, toileting, and locomotion  level  PERSONAL FACTORS: Fitness, Past/current experiences, and Time since onset of injury/illness/exacerbation are also affecting patient's functional outcome.   REHAB POTENTIAL: Fair based on chronicity, degenerative changes and body habitus  CLINICAL DECISION MAKING: Stable/uncomplicated  EVALUATION COMPLEXITY: Moderate   GOALS: Goals reviewed with patient? No  SHORT TERM GOALS: Target date: 03/09/2024    Patient to demonstrate independence in HEP  Baseline: [email or web address] Goal status: INITIAL  2.  Increase STS to 2 reps w/o UE assist Baseline: 1 Goal status: INITIAL   LONG TERM GOALS: Target date: 04/06/2024    Patient will acknowledge 6/10 pain at least once during episode of care   Baseline: 10/10 Goal status: INITIAL  2.  Patient will score at least 22/50 on ODI to signify clinically meaningful improvement in functional abilities.   Baseline: 34/50 Goal status: INITIAL  3.  Patient will increase 30s chair stand reps from 1 to 4 without arms to demonstrate and improved functional ability with less pain/difficulty as well as reduce fall risk.  Baseline: 1 Goal status: INITIAL  4.  214ft ambulation with LRAD Baseline: required WC transport. Goal status: INITIAL    PLAN:  PT FREQUENCY: 1-2x/week  PT DURATION: 6 weeks  PLANNED INTERVENTIONS: 97110-Therapeutic exercises, 97530- Therapeutic activity, [id number]- Neuromuscular re-education, 97535- Self Care, [id number]- Manual therapy, and Patient/Family education.  PLAN FOR NEXT SESSION: HEP review and update, manual techniques as appropriate, aerobic tasks, ROM and flexibility activities, strengthening and PREs, TPDN, gait and balance training,aquatic therapy, modalities for pain and NMRE      Platon Arocho M Antolin Belsito, PT 03/01/2024, 12:58 PM  "

## 2024-03-06 ENCOUNTER — Ambulatory Visit

## 2024-03-06 NOTE — Telephone Encounter (Signed)
 Reached out to [employer] representative for clarification on whether fluid diagnostic monitoring has been disabled following the end of the Alleviate trial. Awaiting response.

## 2024-03-08 ENCOUNTER — Ambulatory Visit

## 2024-03-12 ENCOUNTER — Telehealth: Payer: Self-pay

## 2024-03-12 NOTE — Telephone Encounter (Signed)
 ILR: implanted for AF  Alert remote transmission: RRT reached 03/10/24 -

## 2024-03-13 ENCOUNTER — Ambulatory Visit: Admitting: Cardiology

## 2024-03-13 NOTE — Telephone Encounter (Signed)
 Called patient back to follow up and inform her that the research program she was involved in with measuring fluid status is no longer happening   Patient decided that she does not wish to have the ILR explanted at this time and told this RN that she would ask Daril Kicks, PA-C what he thinks when she sees him at her next scheduled AF clinic visit  Patient instructed to unplug her remote monitor from the wall and patient confirmed that she had already unplugged it  Patient instructed by this RN that if she ever becomes symptomatic with SOB, lightheadedness, or heart racing while at rest to call the device clinic (number provided) and she verbalized understanding

## 2024-03-19 ENCOUNTER — Ambulatory Visit

## 2024-04-19 ENCOUNTER — Ambulatory Visit

## 2024-05-01 ENCOUNTER — Ambulatory Visit: Admitting: Cardiology

## 2024-05-20 ENCOUNTER — Ambulatory Visit

## 2024-06-13 ENCOUNTER — Ambulatory Visit (HOSPITAL_COMMUNITY): Admitting: Physician Assistant

## 2024-06-20 ENCOUNTER — Ambulatory Visit
